# Patient Record
Sex: Male | Born: 1970 | Race: White | Hispanic: No | Marital: Married | Smoking: Former smoker
Health system: Southern US, Community
[De-identification: ages and names within clinical notes are randomized; demographics above are authoritative.]

## PROBLEM LIST (undated history)

## (undated) DIAGNOSIS — I10 Essential (primary) hypertension: Secondary | ICD-10-CM

## (undated) DIAGNOSIS — C801 Malignant (primary) neoplasm, unspecified: Secondary | ICD-10-CM

## (undated) DIAGNOSIS — K219 Gastro-esophageal reflux disease without esophagitis: Secondary | ICD-10-CM

## (undated) DIAGNOSIS — I2510 Atherosclerotic heart disease of native coronary artery without angina pectoris: Secondary | ICD-10-CM

## (undated) DIAGNOSIS — E1169 Type 2 diabetes mellitus with other specified complication: Secondary | ICD-10-CM

## (undated) DIAGNOSIS — Z8669 Personal history of other diseases of the nervous system and sense organs: Secondary | ICD-10-CM

## (undated) DIAGNOSIS — R739 Hyperglycemia, unspecified: Secondary | ICD-10-CM

## (undated) DIAGNOSIS — M549 Dorsalgia, unspecified: Secondary | ICD-10-CM

## (undated) DIAGNOSIS — D649 Anemia, unspecified: Secondary | ICD-10-CM

## (undated) DIAGNOSIS — G629 Polyneuropathy, unspecified: Secondary | ICD-10-CM

## (undated) DIAGNOSIS — M1990 Unspecified osteoarthritis, unspecified site: Secondary | ICD-10-CM

## (undated) DIAGNOSIS — E785 Hyperlipidemia, unspecified: Secondary | ICD-10-CM

## (undated) DIAGNOSIS — M2550 Pain in unspecified joint: Secondary | ICD-10-CM

## (undated) DIAGNOSIS — E669 Obesity, unspecified: Secondary | ICD-10-CM

## (undated) HISTORY — DX: Personal history of other diseases of the nervous system and sense organs: Z86.69

## (undated) HISTORY — DX: Hyperglycemia, unspecified: R73.9

## (undated) HISTORY — PX: NO PAST SURGERIES: SHX2092

## (undated) HISTORY — DX: Essential (primary) hypertension: I10

## (undated) HISTORY — DX: Obesity, unspecified: E66.9

## (undated) HISTORY — DX: Gastro-esophageal reflux disease without esophagitis: K21.9

## (undated) HISTORY — DX: Type 2 diabetes mellitus with other specified complication: E11.69

## (undated) HISTORY — DX: Hyperlipidemia, unspecified: E78.5

## (undated) HISTORY — DX: Pain in unspecified joint: M25.50

## (undated) HISTORY — DX: Dorsalgia, unspecified: M54.9

## (undated) MED FILL — Dexamethasone Sodium Phosphate Inj 100 MG/10ML: INTRAMUSCULAR | Qty: 1 | Status: AC

---

## 2012-04-25 ENCOUNTER — Encounter: Payer: Self-pay | Admitting: Family

## 2012-04-25 ENCOUNTER — Ambulatory Visit (INDEPENDENT_AMBULATORY_CARE_PROVIDER_SITE_OTHER): Payer: Managed Care, Other (non HMO) | Admitting: Family

## 2012-04-25 VITALS — BP 176/130 | HR 66 | Temp 98.2°F | Resp 16 | Ht 74.5 in | Wt 264.0 lb

## 2012-04-25 DIAGNOSIS — R51 Headache: Secondary | ICD-10-CM | POA: Insufficient documentation

## 2012-04-25 DIAGNOSIS — R519 Headache, unspecified: Secondary | ICD-10-CM | POA: Insufficient documentation

## 2012-04-25 DIAGNOSIS — I10 Essential (primary) hypertension: Secondary | ICD-10-CM

## 2012-04-25 LAB — BASIC METABOLIC PANEL
BUN: 13 mg/dL (ref 6–23)
CO2: 29 mEq/L (ref 19–32)
Calcium: 9.7 mg/dL (ref 8.4–10.5)
Chloride: 104 mEq/L (ref 96–112)
Creat: 1.24 mg/dL (ref 0.50–1.35)
Glucose, Bld: 103 mg/dL — ABNORMAL HIGH (ref 70–99)
Potassium: 4.7 mEq/L (ref 3.5–5.3)
Sodium: 140 mEq/L (ref 135–145)

## 2012-04-25 MED ORDER — OLMESARTAN-AMLODIPINE-HCTZ 20-5-12.5 MG PO TABS: 1.0000 | ORAL_TABLET | Freq: Every day | ORAL | Status: DC

## 2012-04-25 MED ORDER — NEBIVOLOL HCL 10 MG PO TABS: 10.0000 mg | ORAL_TABLET | Freq: Every day | ORAL | Status: DC

## 2012-04-25 NOTE — Patient Instructions (Addendum)
Please follow up in 2 weeks for a complete physical.

## 2012-04-25 NOTE — Assessment & Plan Note (Signed)
Uncontrolled HTN.  He has been off of meds x > 1year and believe that his pressure has been running high like this all along based on the readings he has gotten at [facility] etc.  He has been on bystolic and tribenzor.  10mg  of bystolic given today in the office along with samples for both.  Obtain bmet today.  We discussed low sodium diet and weight loss.  We also discussed risks of uncontrolled high blood pressure.

## 2012-04-25 NOTE — Progress Notes (Signed)
Subjective:    Patient ID: Robert [name], male    DOB: 09-20-1970, 41 y.o.   MRN: [id number]  HPI  Mr.  [name] is a 41 yr old male who presents today to establish care.  1) HTN- Diagnosed about 3 years ago.  He was on medications x 1.5 years.  He lost health insurance and stopped medications.  Reports hx of neg stress test.  Reports some musculoskeletal chest pain.  Denies SOB.  Notes occasional ankle swelling.   2) Headaches-  Reports that he has dental issues which he believes attribute to his headaches. Years ago, he had migraines, none x >10 yrs.   Review of Systems  Constitutional: Negative for unexpected weight change.  HENT: Negative for hearing loss.   Eyes: Negative for visual disturbance.  Respiratory: Negative for shortness of breath.   Cardiovascular: Positive for leg swelling.  Gastrointestinal: Negative for nausea, vomiting and diarrhea.       Mild heartburn, uses otc pepcid- helps  Genitourinary: Negative for dysuria and frequency.  Musculoskeletal:       Some musculoskeletal back pain.  Skin: Negative for rash.  Neurological: Positive for headaches.  Hematological: Does not bruise/bleed easily.  Psychiatric/Behavioral:       Denies depression/anxiety   Past Medical History  Diagnosis Date  . Hypertension   . History of migraine headaches     History   Social History  . Marital Status: Married    Spouse Name: N/A    Number of Children: 4  . Years of Education: N/A   Occupational History  .  [employer]   Social History Main Topics  . Smoking status: Former Smoker -- 9 years    Types: Cigarettes  . Smokeless tobacco: Never Used  . Alcohol Use: 3.6 - 6.0 oz/week    6-10 Cans of beer per week  . Drug Use: Not on file  . Sexually Active: Not on file   Other Topics Concern  . Not on file   Social History Narrative   Regular exercise:  Walks 1-2 x weeklyCaffeine use:  3-4 daily4 children- oldest first marriage 93 (son), 59 son, 10 son, 63 yr old  girlMarriedMoved here from Art therapist at [employer]    Past Surgical History  Procedure Date  . No past surgeries     Family History  Problem Relation Age of Onset  . Arthritis Mother   . Hypertension Mother   . Arthritis Father   . Hypertension Father   . Diabetes Father   . Arthritis Maternal Grandmother   . Diabetes Maternal Grandmother   . Arthritis Paternal Grandmother   . Diabetes Paternal Grandmother   . Diabetes Paternal Grandfather   . Cancer Neg Hx   . Kidney disease Neg Hx   . Hyperlipidemia Neg Hx   . Heart disease Neg Hx     Allergies  Allergen Reactions  . Latex Rash    Current Outpatient Prescriptions on File Prior to Visit  Medication Sig Dispense Refill  . nebivolol (BYSTOLIC) 10 MG tablet Take 1 tablet (10 mg total) by mouth daily.  28 tablet  0  . Olmesartan-Amlodipine-HCTZ (TRIBENZOR) 20-5-12.5 MG TABS Take 1 tablet by mouth daily.  21 tablet  0    BP 176/130  Pulse 66  Temp 98.2 F (36.8 C) (Oral)  Resp 16  Ht 6' 2.5" (1.892 m)  Wt 264 lb (119.75 kg)  BMI 33.44 kg/m2  SpO2 96%       Objective:   Physical Exam  Constitutional: He is oriented to person, place, and time. He appears well-developed and well-nourished. No distress.  Cardiovascular: Normal rate and regular rhythm.   No murmur heard. Pulmonary/Chest: Effort normal and breath sounds normal. No respiratory distress. He has no wheezes. He has no rales. He exhibits no tenderness.  Abdominal: Soft. Bowel sounds are normal.  Neurological: He is alert and oriented to person, place, and time.  Skin: Skin is warm and dry.  Psychiatric: He has a normal mood and affect. His behavior is normal. Judgment and thought content normal.          Assessment & Plan:  45 minutes spent with pt today. > 50% of this time was spent counseling pt on weight loss and hypertension.

## 2012-04-25 NOTE — Assessment & Plan Note (Signed)
Pt attributes this to dental issues.  I advised him to establish with a local dentist for further evaluation.

## 2012-04-28 ENCOUNTER — Encounter: Payer: Self-pay | Admitting: Family

## 2012-05-21 ENCOUNTER — Encounter: Payer: Self-pay | Admitting: Internal Medicine

## 2012-05-21 ENCOUNTER — Ambulatory Visit (INDEPENDENT_AMBULATORY_CARE_PROVIDER_SITE_OTHER): Payer: Managed Care, Other (non HMO) | Admitting: Internal Medicine

## 2012-05-21 VITALS — BP 142/90 | HR 54 | Temp 98.3°F | Ht 74.5 in | Wt 265.0 lb

## 2012-05-21 DIAGNOSIS — Z0000 Encounter for general adult medical examination without abnormal findings: Secondary | ICD-10-CM

## 2012-05-21 DIAGNOSIS — I10 Essential (primary) hypertension: Secondary | ICD-10-CM

## 2012-05-21 DIAGNOSIS — Z3009 Encounter for other general counseling and advice on contraception: Secondary | ICD-10-CM

## 2012-05-21 LAB — HEPATIC FUNCTION PANEL
ALT: 21 U/L (ref 0–53)
AST: 20 U/L (ref 0–37)
Albumin: 4.4 g/dL (ref 3.5–5.2)
Alkaline Phosphatase: 52 U/L (ref 39–117)
Bilirubin, Direct: 0.1 mg/dL (ref 0.0–0.3)
Indirect Bilirubin: 0.5 mg/dL (ref 0.0–0.9)
Total Bilirubin: 0.6 mg/dL (ref 0.3–1.2)
Total Protein: 6.7 g/dL (ref 6.0–8.3)

## 2012-05-21 LAB — URINALYSIS, ROUTINE W REFLEX MICROSCOPIC
Bilirubin Urine: NEGATIVE
Glucose, UA: NEGATIVE mg/dL
Hgb urine dipstick: NEGATIVE
Ketones, ur: NEGATIVE mg/dL
Leukocytes, UA: NEGATIVE
Nitrite: NEGATIVE
Protein, ur: NEGATIVE mg/dL
Specific Gravity, Urine: 1.024 (ref 1.005–1.030)
Urobilinogen, UA: 0.2 mg/dL (ref 0.0–1.0)
pH: 5.5 (ref 5.0–8.0)

## 2012-05-21 LAB — CBC WITH DIFFERENTIAL/PLATELET
Basophils Absolute: 0 10*3/uL (ref 0.0–0.1)
Basophils Relative: 1 % (ref 0–1)
Eosinophils Absolute: 0.3 10*3/uL (ref 0.0–0.7)
Eosinophils Relative: 4 % (ref 0–5)
HCT: 45.5 % (ref 39.0–52.0)
Hemoglobin: 15.8 g/dL (ref 13.0–17.0)
Lymphocytes Relative: 32 % (ref 12–46)
Lymphs Abs: 2 10*3/uL (ref 0.7–4.0)
MCH: 29.3 pg (ref 26.0–34.0)
MCHC: 34.7 g/dL (ref 30.0–36.0)
MCV: 84.4 fL (ref 78.0–100.0)
Monocytes Absolute: 0.4 10*3/uL (ref 0.1–1.0)
Monocytes Relative: 6 % (ref 3–12)
Neutro Abs: 3.7 10*3/uL (ref 1.7–7.7)
Neutrophils Relative %: 57 % (ref 43–77)
Platelets: 208 10*3/uL (ref 150–400)
RBC: 5.39 MIL/uL (ref 4.22–5.81)
RDW: 13.2 % (ref 11.5–15.5)
WBC: 6.4 10*3/uL (ref 4.0–10.5)

## 2012-05-21 LAB — TSH: TSH: 0.757 u[IU]/mL (ref 0.350–4.500)

## 2012-05-21 LAB — HEMOGLOBIN A1C
Hgb A1c MFr Bld: 6 % — ABNORMAL HIGH (ref <5.7)
Mean Plasma Glucose: 126 mg/dL — ABNORMAL HIGH (ref <117)

## 2012-05-21 LAB — LIPID PANEL
Cholesterol: 147 mg/dL (ref 0–200)
HDL: 31 mg/dL — ABNORMAL LOW (ref >39)
LDL Cholesterol: 93 mg/dL (ref 0–99)
Total CHOL/HDL Ratio: 4.7 Ratio
Triglycerides: 114 mg/dL (ref <150)
VLDL: 23 mg/dL (ref 0–40)

## 2012-05-21 MED ORDER — AZITHROMYCIN 250 MG PO TABS: ORAL_TABLET | ORAL | Status: AC

## 2012-05-21 MED ORDER — NEBIVOLOL HCL 10 MG PO TABS: 10.0000 mg | ORAL_TABLET | Freq: Every day | ORAL | Status: DC

## 2012-05-21 MED ORDER — OLMESARTAN-AMLODIPINE-HCTZ 20-5-12.5 MG PO TABS: 1.0000 | ORAL_TABLET | Freq: Every day | ORAL | Status: DC

## 2012-05-26 ENCOUNTER — Telehealth: Payer: Self-pay | Admitting: *Deleted

## 2012-05-26 NOTE — Telephone Encounter (Signed)
Message copied by Merrilyn Puma on Mon May 26, 2012  9:03 AM ------      Message from: Edwyna Perfect      Created: Sun May 25, 2012 11:23 AM       hdl low. Can improve with regular aerobic exercise. a1c mildly high. Low sugar/carb diet and exercise

## 2012-05-26 NOTE — Telephone Encounter (Signed)
Left mess for patient to call back.  

## 2012-05-31 DIAGNOSIS — Z0000 Encounter for general adult medical examination without abnormal findings: Secondary | ICD-10-CM | POA: Insufficient documentation

## 2012-05-31 DIAGNOSIS — Z1211 Encounter for screening for malignant neoplasm of colon: Secondary | ICD-10-CM | POA: Insufficient documentation

## 2012-05-31 NOTE — Progress Notes (Signed)
  Subjective:    Patient ID: Robert [name], male    DOB: 10/28/1970, 41 y.o.   MRN: [id number]  HPI patient presents to clinic for complete physical exam. Blood pressure is minimally elevated. Interested in possible referral for vasectomy. Notes two-week history of cough without improvement. No shortness of breath wheezing fever or chills.   Past Medical History  Diagnosis Date  . Hypertension   . History of migraine headaches    Past Surgical History  Procedure Date  . No past surgeries     reports that he has quit smoking. His smoking use included Cigarettes. He quit after 9 years of use. He has never used smokeless tobacco. He reports that he drinks about 3.6 - 6 ounces of alcohol per week. His drug history not on file. family history includes Arthritis in his father, maternal grandmother, mother, and paternal grandmother; Diabetes in his father, maternal grandmother, paternal grandfather, and paternal grandmother; and Hypertension in his father and mother.  There is no history of Cancer, and Kidney disease, and Hyperlipidemia, and Heart disease, . Allergies  Allergen Reactions  . Latex Rash     Review of Systems see history of present illness     Objective:   Physical Exam  Physical Exam  Nursing note and vitals reviewed. Constitutional: He appears well-developed and well-nourished. No distress.  HENT:  Head: Normocephalic and atraumatic.  Right Ear: Tympanic membrane and external ear normal.  Left Ear: Tympanic membrane and external ear normal.  Nose: Nose normal.  Mouth/Throat: Uvula is midline, oropharynx is clear and moist and mucous membranes are normal. No oropharyngeal exudate.  Eyes: Conjunctivae and EOM are normal. Pupils are equal, round, and reactive to light. Right eye exhibits no discharge. Left eye exhibits no discharge. No scleral icterus.  Neck: Neck supple. Carotid bruit is not present. No thyromegaly present.  Cardiovascular: Normal rate, regular rhythm and  normal heart sounds.  Exam reveals no gallop and no friction rub.   No murmur heard. Pulmonary/Chest: Effort normal and breath sounds normal. No respiratory distress. He has no wheezes. He has no rales.  Abdominal: Soft. He exhibits no distension and no mass. There is no hepatosplenomegaly. There is no tenderness. There is no rebound. Hernia confirmed negative in the right inguinal area and confirmed negative in the left inguinal area.   Lymphadenopathy:    He has no cervical adenopathy.  Neurological: He is alert.  Skin: Skin is warm and dry. He is not diaphoretic.  Psychiatric: He has a normal mood and affect.        Assessment & Plan:

## 2012-05-31 NOTE — Assessment & Plan Note (Signed)
Obtain CPE labs. Recommend out patient blood pressure log to be submitted for review. Urology consult for consideration of vasectomy. Provide with antibiotic given persistence of cough. Followup if no improvement or worsening.

## 2012-06-04 ENCOUNTER — Encounter: Payer: Self-pay | Admitting: *Deleted

## 2012-06-04 NOTE — Telephone Encounter (Signed)
Called pt's phone number in EMR & this is incorrect number and pt is unknown to owner of number; mailed results letter/SLS

## 2012-07-02 ENCOUNTER — Ambulatory Visit (INDEPENDENT_AMBULATORY_CARE_PROVIDER_SITE_OTHER): Payer: Managed Care, Other (non HMO) | Admitting: Family

## 2012-07-02 ENCOUNTER — Encounter: Payer: Self-pay | Admitting: Family

## 2012-07-02 VITALS — BP 142/90 | HR 51 | Temp 98.1°F | Resp 16 | Wt 272.0 lb

## 2012-07-02 DIAGNOSIS — L290 Pruritus ani: Secondary | ICD-10-CM

## 2012-07-02 MED ORDER — NYSTATIN 100000 UNIT/GM EX POWD: Freq: Two times a day (BID) | CUTANEOUS | Status: DC

## 2012-07-02 MED ORDER — HYDROCORTISONE ACE-PRAMOXINE 1-1 % RE FOAM: 1.0000 | Freq: Two times a day (BID) | RECTAL | Status: DC

## 2012-07-02 NOTE — Patient Instructions (Addendum)
Please call if symptoms worsen or if no improvement in 1 week.  

## 2012-07-02 NOTE — Progress Notes (Signed)
  Subjective:    Patient ID: Robert [name], male    DOB: [birth year], 41 y.o.   MRN: [id number]  HPI  Mr.  [name] is a 41 yr old male who presents today with chief complaint of rectal pain.  He reports that he has seen a small amount of blood on the  Toilet tissue after bm's.  BM's have been softer than usual.  He notes significant itching in the rectal area despite use of gold bond powder.   Review of Systems    see HPI  Past Medical History  Diagnosis Date  . Hypertension   . History of migraine headaches     History   Social History  . Marital Status: Married    Spouse Name: N/A    Number of Children: 4  . Years of Education: N/A   Occupational History  .  [employer]   Social History Main Topics  . Smoking status: Former Smoker -- 9 years    Types: Cigarettes  . Smokeless tobacco: Never Used  . Alcohol Use: 3.6 - 6.0 oz/week    6-10 Cans of beer per week  . Drug Use: Not on file  . Sexually Active: Not on file   Other Topics Concern  . Not on file   Social History Narrative   Regular exercise:  Walks 1-2 x weeklyCaffeine use:  3-4 daily4 children- oldest first marriage 44 (son), 37 son, 10 son, 27 yr old girlMarriedMoved here from Art therapist at [employer]    Past Surgical History  Procedure Date  . No past surgeries     Family History  Problem Relation Age of Onset  . Arthritis Mother   . Hypertension Mother   . Arthritis Father   . Hypertension Father   . Diabetes Father   . Arthritis Maternal Grandmother   . Diabetes Maternal Grandmother   . Arthritis Paternal Grandmother   . Diabetes Paternal Grandmother   . Diabetes Paternal Grandfather   . Cancer Neg Hx   . Kidney disease Neg Hx   . Hyperlipidemia Neg Hx   . Heart disease Neg Hx     Allergies  Allergen Reactions  . Latex Rash    Current Outpatient Prescriptions on File Prior to Visit  Medication Sig Dispense Refill  . nebivolol (BYSTOLIC) 10 MG tablet Take 1 tablet (10 mg total) by  mouth daily.  30 tablet  6  . Olmesartan-Amlodipine-HCTZ (TRIBENZOR) 20-5-12.5 MG TABS Take 1 tablet by mouth daily.  30 tablet  6    BP 142/90  Pulse 51  Temp 98.1 F (36.7 C) (Oral)  Resp 16  Wt 272 lb (123.378 kg)  SpO2 97%    Objective:   Physical Exam  Genitourinary:       Some erythema of skin surrounding anus in gluteal fold.  No visible exterior hemorrhoids are noted.  No palpable interior hemorrhoids are noted.           Assessment & Plan:

## 2012-07-02 NOTE — Assessment & Plan Note (Signed)
He could have some internal hemorrhoids which I was unable to palpate.  Appears to have candidal skin infection (mild) surrounding anus.  Will rx proctofoam HC cream along with nystatin powder to be used on skin.  If no improvement consider stool studies.

## 2012-08-20 ENCOUNTER — Ambulatory Visit: Payer: Managed Care, Other (non HMO) | Admitting: Internal Medicine

## 2012-08-20 DIAGNOSIS — Z0289 Encounter for other administrative examinations: Secondary | ICD-10-CM

## 2012-08-20 NOTE — Progress Notes (Signed)
  Subjective:    Patient ID: Robert [name], male    DOB: Dec 20, 1970, 42 y.o.   MRN: [id number]  HPI no show    Review of Systems      Objective:   Physical Exam         Assessment & Plan:

## 2012-11-06 ENCOUNTER — Ambulatory Visit (INDEPENDENT_AMBULATORY_CARE_PROVIDER_SITE_OTHER): Payer: 59 | Admitting: Family Medicine

## 2012-11-06 ENCOUNTER — Encounter: Payer: Self-pay | Admitting: Family Medicine

## 2012-11-06 VITALS — BP 164/102 | HR 67 | Temp 98.2°F | Ht 74.5 in | Wt 267.4 lb

## 2012-11-06 DIAGNOSIS — R739 Hyperglycemia, unspecified: Secondary | ICD-10-CM

## 2012-11-06 DIAGNOSIS — R7309 Other abnormal glucose: Secondary | ICD-10-CM

## 2012-11-06 DIAGNOSIS — E785 Hyperlipidemia, unspecified: Secondary | ICD-10-CM

## 2012-11-06 DIAGNOSIS — I10 Essential (primary) hypertension: Secondary | ICD-10-CM

## 2012-11-06 MED ORDER — CARVEDILOL 6.25 MG PO TABS: 6.2500 mg | ORAL_TABLET | Freq: Two times a day (BID) | ORAL | Status: DC

## 2012-11-06 MED ORDER — LISINOPRIL-HYDROCHLOROTHIAZIDE 20-12.5 MG PO TABS: 1.0000 | ORAL_TABLET | Freq: Two times a day (BID) | ORAL | Status: DC

## 2012-11-06 NOTE — Patient Instructions (Addendum)
DASH diet and consider sleep study  Hypertension As your heart beats, it forces blood through your arteries. This force is your blood pressure. If the pressure is too high, it is called hypertension (HTN) or high blood pressure. HTN is dangerous because you may have it and not know it. High blood pressure may mean that your heart has to work harder to pump blood. Your arteries may be narrow or stiff. The extra work puts you at risk for heart disease, stroke, and other problems.  Blood pressure consists of two numbers, a higher number over a lower, 110/72, for example. It is stated as "110 over 72." The ideal is below 120 for the top number (systolic) and under 80 for the bottom (diastolic). Write down your blood pressure today. You should pay close attention to your blood pressure if you have certain conditions such as:  Heart failure.  Prior heart attack.  Diabetes  Chronic kidney disease.  Prior stroke.  Multiple risk factors for heart disease. To see if you have HTN, your blood pressure should be measured while you are seated with your arm held at the level of the heart. It should be measured at least twice. A one-time elevated blood pressure reading (especially in the Emergency Department) does not mean that you need treatment. There may be conditions in which the blood pressure is different between your right and left arms. It is important to see your caregiver soon for a recheck. Most people have essential hypertension which means that there is not a specific cause. This type of high blood pressure may be lowered by changing lifestyle factors such as:  Stress.  Smoking.  Lack of exercise.  Excessive weight.  Drug/tobacco/alcohol use.  Eating less salt. Most people do not have symptoms from high blood pressure until it has caused damage to the body. Effective treatment can often prevent, delay or reduce that damage. TREATMENT  When a cause has been identified, treatment for  high blood pressure is directed at the cause. There are a large number of medications to treat HTN. These fall into several categories, and your caregiver will help you select the medicines that are best for you. Medications may have side effects. You should review side effects with your caregiver. If your blood pressure stays high after you have made lifestyle changes or started on medicines,   Your medication(s) may need to be changed.  Other problems may need to be addressed.  Be certain you understand your prescriptions, and know how and when to take your medicine.  Be sure to follow up with your caregiver within the time frame advised (usually within two weeks) to have your blood pressure rechecked and to review your medications.  If you are taking more than one medicine to lower your blood pressure, make sure you know how and at what times they should be taken. Taking two medicines at the same time can result in blood pressure that is too low. SEEK IMMEDIATE MEDICAL CARE IF:  You develop a severe headache, blurred or changing vision, or confusion.  You have unusual weakness or numbness, or a faint feeling.  You have severe chest or abdominal pain, vomiting, or breathing problems. MAKE SURE YOU:   Understand these instructions.  Will watch your condition.  Will get help right away if you are not doing well or get worse. Document Released: 07/30/2005 Document Revised: 10/22/2011 Document Reviewed: 03/19/2008 [facility] Patient Information 2013 [place], [place].

## 2012-11-09 ENCOUNTER — Encounter: Payer: Self-pay | Admitting: Family Medicine

## 2012-11-09 DIAGNOSIS — E785 Hyperlipidemia, unspecified: Secondary | ICD-10-CM

## 2012-11-09 DIAGNOSIS — E1169 Type 2 diabetes mellitus with other specified complication: Secondary | ICD-10-CM

## 2012-11-09 DIAGNOSIS — R739 Hyperglycemia, unspecified: Secondary | ICD-10-CM

## 2012-11-09 DIAGNOSIS — E669 Obesity, unspecified: Secondary | ICD-10-CM

## 2012-11-09 HISTORY — DX: Hyperlipidemia, unspecified: E78.5

## 2012-11-09 HISTORY — DX: Type 2 diabetes mellitus with other specified complication: E66.9

## 2012-11-09 HISTORY — DX: Hyperglycemia, unspecified: R73.9

## 2012-11-09 HISTORY — DX: Type 2 diabetes mellitus with other specified complication: E11.69

## 2012-11-09 NOTE — Assessment & Plan Note (Signed)
Has not been taking meds secondary to costs. Will d/c meds and switch to Carvedilol and Lisinopril hct. Try DASH diet

## 2012-11-09 NOTE — Assessment & Plan Note (Signed)
Mild, minimize simple carbs. Increase exercise.

## 2012-11-09 NOTE — Progress Notes (Signed)
Patient ID: Robert [name], male   DOB: [birth year], 42 y.o.   MRN: [id number] Robert [name] [id number] 1971/03/05 11/09/2012      Progress Note-Follow Up  Subjective  Chief Complaint  Chief Complaint  Patient presents with  . Follow-up    HPI  Patient is a 42 year old male who is in today for followup. Generally he is feeling well technologist she has not been taking his hypertensive meds do to cost. Denies any headache or chest pain. No palpitations or shortness of breath. No recent illness or congestion. Is not taking any new over-the-counter medications. Denies any new or worsening stressors or difficulty sleeping.  Past Medical History  Diagnosis Date  . Hypertension   . History of migraine headaches   . Dyslipidemia 11/09/2012  . Hyperglycemia 11/09/2012    Past Surgical History  Procedure Laterality Date  . No past surgeries      Family History  Problem Relation Age of Onset  . Arthritis Mother   . Hypertension Mother   . Arthritis Father   . Hypertension Father   . Diabetes Father   . Arthritis Maternal Grandmother   . Diabetes Maternal Grandmother   . Arthritis Paternal Grandmother   . Diabetes Paternal Grandmother   . Diabetes Paternal Grandfather   . Cancer Neg Hx   . Kidney disease Neg Hx   . Hyperlipidemia Neg Hx   . Heart disease Neg Hx     History   Social History  . Marital Status: Married    Spouse Name: N/A    Number of Children: 4  . Years of Education: N/A   Occupational History  .  [employer]   Social History Main Topics  . Smoking status: Former Smoker -- 9 years    Types: Cigarettes  . Smokeless tobacco: Never Used  . Alcohol Use: 3.6 - 6 oz/week    6-10 Cans of beer per week  . Drug Use: Not on file  . Sexually Active: Not on file   Other Topics Concern  . 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   No current facility-administered medications on file prior to visit.    Allergies  Allergen Reactions  . Latex Rash    Review of Systems  Review of Systems  Constitutional: Negative for fever and malaise/fatigue.  HENT: Negative for congestion.   Eyes: Negative for discharge.  Respiratory: Negative for shortness of breath.   Cardiovascular: Negative for chest pain, palpitations and leg swelling.  Gastrointestinal: Negative for nausea, abdominal pain and diarrhea.  Genitourinary: Negative for dysuria.  Musculoskeletal: Negative for falls.  Skin: Negative for rash.  Neurological: Negative for loss of consciousness and headaches.  Endo/Heme/Allergies: Negative for polydipsia.  Psychiatric/Behavioral: Negative for depression and suicidal ideas. The patient is not nervous/anxious and does not have insomnia.     Objective  BP 164/102  Pulse 67  Temp(Src) 98.2 F (36.8 C) (Oral)  Ht 6' 2.5" (1.892 m)  Wt 267 lb 6.4 oz (121.292 kg)  BMI 33.88 kg/m2  SpO2 95%  Physical Exam  Physical Exam  Constitutional: He  is oriented to person, place, and time and well-developed, well-nourished, and in no distress. No distress.  HENT:  Head: Normocephalic and atraumatic.  Eyes: Conjunctivae are normal.  Neck: Neck supple. No thyromegaly present.  Cardiovascular: Normal rate, regular rhythm and normal heart sounds.   No murmur heard. Pulmonary/Chest: Effort normal and breath sounds normal. No respiratory distress.  Abdominal: He exhibits no distension and no mass. There is no tenderness.  Musculoskeletal: He exhibits no edema.  Neurological: He is alert and oriented to person, place, and time.  Skin: Skin is warm.  Psychiatric: Memory, affect and judgment normal.    Lab Results  Component Value Date   TSH 0.757 05/21/2012   Lab  Results  Component Value Date   WBC 6.4 05/21/2012   HGB 15.8 05/21/2012   HCT 45.5 05/21/2012   MCV 84.4 05/21/2012   PLT 208 05/21/2012   Lab Results  Component Value Date   CREATININE 1.24 04/25/2012   BUN 13 04/25/2012   NA 140 04/25/2012   K 4.7 04/25/2012   CL 104 04/25/2012   CO2 29 04/25/2012   Lab Results  Component Value Date   ALT 21 05/21/2012   AST 20 05/21/2012   ALKPHOS 52 05/21/2012   BILITOT 0.6 05/21/2012   Lab Results  Component Value Date   CHOL 147 05/21/2012   Lab Results  Component Value Date   HDL 31* 05/21/2012   Lab Results  Component Value Date   LDLCALC 93 05/21/2012   Lab Results  Component Value Date   TRIG 114 05/21/2012   Lab Results  Component Value Date   CHOLHDL 4.7 05/21/2012     Assessment & Plan  HTN (hypertension) Has not been taking meds secondary to costs. Will d/c meds and switch to Carvedilol and Lisinopril hct. Try DASH diet  Hyperglycemia Mild, minimize simple carbs. Increase exercise.  Dyslipidemia Slightly low hdl cholesterol. Encouraged increased exercise and consider adding Krill oil caps daily

## 2012-11-09 NOTE — Assessment & Plan Note (Signed)
Slightly low hdl cholesterol. Encouraged increased exercise and consider adding Krill oil caps daily

## 2012-11-10 ENCOUNTER — Encounter: Payer: Self-pay | Admitting: Family Medicine

## 2012-12-08 ENCOUNTER — Encounter: Payer: Self-pay | Admitting: Family Medicine

## 2012-12-08 ENCOUNTER — Ambulatory Visit (INDEPENDENT_AMBULATORY_CARE_PROVIDER_SITE_OTHER): Payer: 59 | Admitting: Family Medicine

## 2012-12-08 VITALS — BP 158/110 | HR 64 | Temp 98.2°F | Ht 74.5 in | Wt 264.0 lb

## 2012-12-08 DIAGNOSIS — I10 Essential (primary) hypertension: Secondary | ICD-10-CM

## 2012-12-08 MED ORDER — LISINOPRIL-HYDROCHLOROTHIAZIDE 20-12.5 MG PO TABS: 1.0000 | ORAL_TABLET | Freq: Two times a day (BID) | ORAL | Status: DC

## 2012-12-08 MED ORDER — CARVEDILOL 6.25 MG PO TABS: 6.2500 mg | ORAL_TABLET | Freq: Two times a day (BID) | ORAL | Status: DC

## 2012-12-08 MED ORDER — AMLODIPINE BESYLATE 5 MG PO TABS: 5.0000 mg | ORAL_TABLET | Freq: Every day | ORAL | Status: DC

## 2012-12-08 NOTE — Assessment & Plan Note (Addendum)
Feeling better since his last visit. Did not take his medications this am, he ran out last night. Start Amlodipine 5 mg dailyand return in 4 weeks on meds

## 2012-12-08 NOTE — Progress Notes (Signed)
Patient ID: Robert [name], male   DOB: May 27, 1971, 42 y.o.   MRN: [id number] Robert [name] [id number] May 30, 1971 12/08/2012      Progress Note-Follow Up  Subjective  Chief Complaint  Chief Complaint  Patient presents with  . Follow-up    1 month    HPI  Patient is a 42 year old Caucasian male in today for follow up. Unfortunately he ran out of his bp meds last night so he has not taken them today. He reports feeling better since his last visit. Is still seeing numbers 150-180/100-135 when he checks in elsewhere. Denies HA/CP/palp/SOB/illness, gi or gu concerns. Notes some paresthesias in finger tips in cold weather but no other acute concerns  Past Medical History  Diagnosis Date  . Hypertension   . History of migraine headaches   . Dyslipidemia 11/09/2012  . Hyperglycemia 11/09/2012    Past Surgical History  Procedure Laterality Date  . No past surgeries      Family History  Problem Relation Age of Onset  . Arthritis Mother   . Hypertension Mother   . Arthritis Father   . Hypertension Father   . Diabetes Father   . Arthritis Maternal Grandmother   . Diabetes Maternal Grandmother   . Stroke Maternal Grandmother   . Arthritis Paternal Grandmother   . Diabetes Paternal Grandmother   . Diabetes Paternal Grandfather   . Heart disease Paternal Grandfather     MI  . Cancer Neg Hx   . Kidney disease Neg Hx   . Hyperlipidemia Brother   . Hypertension Brother   . Diabetes Brother   . Hyperlipidemia Daughter   . Diabetes Daughter   . Hypertension Daughter     History   Social History  . Marital Status: Married    Spouse Name: N/A    Number of Children: 4  . Years of Education: N/A   Occupational History  .  [employer]   Social History Main Topics  . Smoking status: Former Smoker -- 9 years    Types: Cigarettes  . Smokeless tobacco: Never Used  . Alcohol Use: 3.6 - 6 oz/week    6-10 Cans of beer per week  . Drug Use: Not on file  . Sexually Active: Not on  file   Other Topics Concern  . 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   No current facility-administered medications on file prior to visit.    Allergies  Allergen Reactions  . Latex Rash    Review of Systems  Review of Systems  Constitutional: Negative for fever and malaise/fatigue.  HENT: Negative for congestion.   Eyes: Negative for discharge.  Respiratory: Negative for shortness of breath.   Cardiovascular: Negative for chest pain, palpitations and leg swelling.  Gastrointestinal: Negative for nausea, abdominal pain and diarrhea.  Genitourinary: Negative for dysuria.  Musculoskeletal: Negative for falls.  Skin: Negative for rash.  Neurological: Negative for loss of consciousness and headaches.  Endo/Heme/Allergies: Negative for polydipsia.  Psychiatric/Behavioral: Negative for depression and suicidal ideas. The patient is not nervous/anxious and does not have insomnia.  Objective  BP 158/110  Pulse 64  Temp(Src) 98.2 F (36.8 C) (Oral)  Ht 6' 2.5" (1.892 m)  Wt 264 lb 0.6 oz (119.768 kg)  BMI 33.46 kg/m2  SpO2 97%  Physical Exam  Physical Exam  Constitutional: He is oriented to person, place, and time and well-developed, well-nourished, and in no distress. No distress.  HENT:  Head: Normocephalic and atraumatic.  Eyes: Conjunctivae are normal.  Neck: Neck supple. No thyromegaly present.  Cardiovascular: Normal rate, regular rhythm and normal heart sounds.   No murmur heard. Pulmonary/Chest: Effort normal and breath sounds normal. No respiratory distress.  Abdominal: He exhibits no distension and no mass. There is no tenderness.  Musculoskeletal: He exhibits no edema.   Neurological: He is alert and oriented to person, place, and time.  Skin: Skin is warm.  Psychiatric: Memory, affect and judgment normal.    Lab Results  Component Value Date   TSH 0.757 05/21/2012   Lab Results  Component Value Date   WBC 6.4 05/21/2012   HGB 15.8 05/21/2012   HCT 45.5 05/21/2012   MCV 84.4 05/21/2012   PLT 208 05/21/2012   Lab Results  Component Value Date   CREATININE 1.24 04/25/2012   BUN 13 04/25/2012   NA 140 04/25/2012   K 4.7 04/25/2012   CL 104 04/25/2012   CO2 29 04/25/2012   Lab Results  Component Value Date   ALT 21 05/21/2012   AST 20 05/21/2012   ALKPHOS 52 05/21/2012   BILITOT 0.6 05/21/2012   Lab Results  Component Value Date   CHOL 147 05/21/2012   Lab Results  Component Value Date   HDL 31* 05/21/2012   Lab Results  Component Value Date   LDLCALC 93 05/21/2012   Lab Results  Component Value Date   TRIG 114 05/21/2012   Lab Results  Component Value Date   CHOLHDL 4.7 05/21/2012     Assessment & Plan  HTN (hypertension) Feeling better since his last visit. Did not take his medications this am, he ran out last night. Start Amlodipine 5 mg dailyand return in 4 weeks on meds

## 2012-12-08 NOTE — Patient Instructions (Addendum)

## 2013-01-08 ENCOUNTER — Ambulatory Visit (INDEPENDENT_AMBULATORY_CARE_PROVIDER_SITE_OTHER): Payer: 59 | Admitting: Family Medicine

## 2013-01-08 ENCOUNTER — Encounter: Payer: Self-pay | Admitting: Family Medicine

## 2013-01-08 VITALS — BP 132/92 | HR 72 | Temp 98.7°F | Ht 74.5 in | Wt 261.1 lb

## 2013-01-08 DIAGNOSIS — R739 Hyperglycemia, unspecified: Secondary | ICD-10-CM

## 2013-01-08 DIAGNOSIS — R7309 Other abnormal glucose: Secondary | ICD-10-CM

## 2013-01-08 DIAGNOSIS — E785 Hyperlipidemia, unspecified: Secondary | ICD-10-CM

## 2013-01-08 DIAGNOSIS — M2550 Pain in unspecified joint: Secondary | ICD-10-CM

## 2013-01-08 DIAGNOSIS — I10 Essential (primary) hypertension: Secondary | ICD-10-CM

## 2013-01-08 LAB — CBC
HCT: 46.1 % (ref 39.0–52.0)
Hemoglobin: 16.3 g/dL (ref 13.0–17.0)
MCH: 29.4 pg (ref 26.0–34.0)
MCHC: 35.4 g/dL (ref 30.0–36.0)
MCV: 83.2 fL (ref 78.0–100.0)
Platelets: 207 10*3/uL (ref 150–400)
RBC: 5.54 MIL/uL (ref 4.22–5.81)
RDW: 14 % (ref 11.5–15.5)
WBC: 7.3 10*3/uL (ref 4.0–10.5)

## 2013-01-08 LAB — HEPATIC FUNCTION PANEL
ALT: 35 U/L (ref 0–53)
AST: 29 U/L (ref 0–37)
Albumin: 4.7 g/dL (ref 3.5–5.2)
Alkaline Phosphatase: 51 U/L (ref 39–117)
Bilirubin, Direct: 0.1 mg/dL (ref 0.0–0.3)
Indirect Bilirubin: 0.7 mg/dL (ref 0.0–0.9)
Total Bilirubin: 0.8 mg/dL (ref 0.3–1.2)
Total Protein: 7.1 g/dL (ref 6.0–8.3)

## 2013-01-08 LAB — LIPID PANEL
Cholesterol: 167 mg/dL (ref 0–200)
HDL: 38 mg/dL — ABNORMAL LOW (ref >39)
LDL Cholesterol: 87 mg/dL (ref 0–99)
Total CHOL/HDL Ratio: 4.4 Ratio
Triglycerides: 209 mg/dL — ABNORMAL HIGH (ref <150)
VLDL: 42 mg/dL — ABNORMAL HIGH (ref 0–40)

## 2013-01-08 LAB — RENAL FUNCTION PANEL
Albumin: 4.7 g/dL (ref 3.5–5.2)
BUN: 13 mg/dL (ref 6–23)
CO2: 29 mEq/L (ref 19–32)
Calcium: 9.9 mg/dL (ref 8.4–10.5)
Chloride: 100 mEq/L (ref 96–112)
Creat: 1.12 mg/dL (ref 0.50–1.35)
Glucose, Bld: 106 mg/dL — ABNORMAL HIGH (ref 70–99)
Phosphorus: 3.7 mg/dL (ref 2.3–4.6)
Potassium: 4.3 mEq/L (ref 3.5–5.3)
Sodium: 136 mEq/L (ref 135–145)

## 2013-01-08 LAB — TSH: TSH: 1.016 u[IU]/mL (ref 0.350–4.500)

## 2013-01-08 NOTE — Patient Instructions (Addendum)
Krill oil caps, MegaRed Probiotic, Digestive Advantage daily   Hypertension As your heart beats, it forces blood through your arteries. This force is your blood pressure. If the pressure is too high, it is called hypertension (HTN) or high blood pressure. HTN is dangerous because you may have it and not know it. High blood pressure may mean that your heart has to work harder to pump blood. Your arteries may be narrow or stiff. The extra work puts you at risk for heart disease, stroke, and other problems.  Blood pressure consists of two numbers, a higher number over a lower, 110/72, for example. It is stated as "110 over 72." The ideal is below 120 for the top number (systolic) and under 80 for the bottom (diastolic). Write down your blood pressure today. You should pay close attention to your blood pressure if you have certain conditions such as:  Heart failure.  Prior heart attack.  Diabetes  Chronic kidney disease.  Prior stroke.  Multiple risk factors for heart disease. To see if you have HTN, your blood pressure should be measured while you are seated with your arm held at the level of the heart. It should be measured at least twice. A one-time elevated blood pressure reading (especially in the Emergency Department) does not mean that you need treatment. There may be conditions in which the blood pressure is different between your right and left arms. It is important to see your caregiver soon for a recheck. Most people have essential hypertension which means that there is not a specific cause. This type of high blood pressure may be lowered by changing lifestyle factors such as:  Stress.  Smoking.  Lack of exercise.  Excessive weight.  Drug/tobacco/alcohol use.  Eating less salt. Most people do not have symptoms from high blood pressure until it has caused damage to the body. Effective treatment can often prevent, delay or reduce that damage. TREATMENT  When a cause has been  identified, treatment for high blood pressure is directed at the cause. There are a large number of medications to treat HTN. These fall into several categories, and your caregiver will help you select the medicines that are best for you. Medications may have side effects. You should review side effects with your caregiver. If your blood pressure stays high after you have made lifestyle changes or started on medicines,   Your medication(s) may need to be changed.  Other problems may need to be addressed.  Be certain you understand your prescriptions, and know how and when to take your medicine.  Be sure to follow up with your caregiver within the time frame advised (usually within two weeks) to have your blood pressure rechecked and to review your medications.  If you are taking more than one medicine to lower your blood pressure, make sure you know how and at what times they should be taken. Taking two medicines at the same time can result in blood pressure that is too low. SEEK IMMEDIATE MEDICAL CARE IF:  You develop a severe headache, blurred or changing vision, or confusion.  You have unusual weakness or numbness, or a faint feeling.  You have severe chest or abdominal pain, vomiting, or breathing problems. MAKE SURE YOU:   Understand these instructions.  Will watch your condition.  Will get help right away if you are not doing well or get worse. Document Released: 07/30/2005 Document Revised: 10/22/2011 Document Reviewed: 03/19/2008 [facility] Patient Information 2014 [employer], [place].

## 2013-01-09 LAB — MAGNESIUM: Magnesium: 2.1 mg/dL (ref 1.5–2.5)

## 2013-01-09 LAB — RHEUMATOID FACTOR: Rhuematoid fact SerPl-aCnc: <10 IU/mL (ref <=14)

## 2013-01-09 LAB — ANA: Anti Nuclear Antibody(ANA): NEGATIVE

## 2013-01-09 NOTE — Progress Notes (Signed)
Quick Note:  Patient Informed and voiced understanding ______ 

## 2013-01-11 ENCOUNTER — Encounter: Payer: Self-pay | Admitting: Family Medicine

## 2013-01-11 DIAGNOSIS — M2550 Pain in unspecified joint: Secondary | ICD-10-CM

## 2013-01-11 HISTORY — DX: Pain in unspecified joint: M25.50

## 2013-01-11 NOTE — Assessment & Plan Note (Signed)
Labs normal, add krill oil increase exercise, Aleve or Advil prn.

## 2013-01-11 NOTE — Progress Notes (Signed)
Patient ID: Robert [name], male   DOB: July 26, 1971, 42 y.o.   MRN: [id number] Robert [name] [id number] 1971-05-05 01/11/2013      Progress Note-Follow Up  Subjective  Chief Complaint  Chief Complaint  Patient presents with  . Follow-up    4 week    HPI  Patient is a 42 year old male in today for followup. Patient's biggest concern is increased pain. Has pain in numerous joints. Bilateral hips right worse than left. Also pain in knees ankles and low back. No recent fall, fever or illness,. Has been taking medicaitons as prescribed and has developed some mild constipation with beta blockade. Previously had 2 BMs daily now straining to go every day. No bloody or tarry stool. No CP/palp/SOB/GU c/o. Past Medical History  Diagnosis Date  . Hypertension   . History of migraine headaches   . Dyslipidemia 11/09/2012  . Hyperglycemia 11/09/2012  . Arthralgia 01/11/2013    Diffuse, b/l Hips R>L Knees, ankles, low back    Past Surgical History  Procedure Laterality Date  . No past surgeries      Family History  Problem Relation Age of Onset  . Arthritis Mother   . Hypertension Mother   . Arthritis Father   . Hypertension Father   . Diabetes Father   . Arthritis Maternal Grandmother   . Diabetes Maternal Grandmother   . Stroke Maternal Grandmother   . Arthritis Paternal Grandmother   . Diabetes Paternal Grandmother   . Diabetes Paternal Grandfather   . Heart disease Paternal Grandfather     MI  . Cancer Neg Hx   . Kidney disease Neg Hx   . Hyperlipidemia Brother   . Hypertension Brother   . Diabetes Brother   . Hyperlipidemia Daughter   . Diabetes Daughter   . Hypertension Daughter     History   Social History  . Marital Status: Married    Spouse Name: N/A    Number of Children: 4  . Years of Education: N/A   Occupational History  .  [employer]   Social History Main Topics  . Smoking status: Former Smoker -- 9 years    Types: Cigarettes  . Smokeless tobacco: Never  Used  . Alcohol Use: 3.6 - 6 oz/week    6-10 Cans of beer per week  . Drug Use: Not on file  . Sexually Active: Not on file   Other Topics Concern  . 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 amLODipine (NORVASC) 5 MG tablet Take 1 tablet (5 mg total) by mouth daily.  30 tablet  3  . carvedilol (COREG) 6.25 MG tablet Take 1 tablet (6.25 mg total) by mouth 2 (two) times daily with a meal.  60 tablet  2  . lisinopril-hydrochlorothiazide (ZESTORETIC) 20-12.5 MG per tablet Take 1 tablet by mouth 2 (two) times daily.  60 tablet  2   No current facility-administered medications on file prior to visit.    Allergies  Allergen Reactions  . Latex Rash    Review of Systems  Review of Systems  Constitutional: Negative for fever and malaise/fatigue.  HENT: Negative for congestion.   Eyes: Negative for discharge.  Respiratory: Negative for shortness of breath.   Cardiovascular: Negative for chest pain, palpitations and leg swelling.  Gastrointestinal: Positive for constipation. Negative for nausea, abdominal pain and diarrhea.  Genitourinary: Negative for dysuria.  Musculoskeletal: Positive for myalgias and back pain. Negative for falls.  Skin: Negative for rash.  Neurological: Negative for loss of consciousness and headaches.  Endo/Heme/Allergies: Negative for polydipsia.  Psychiatric/Behavioral: Negative for depression and suicidal ideas. The patient is not nervous/anxious and does not have insomnia.     Objective  BP 132/92  Pulse 72  Temp(Src) 98.7 F (37.1 C) (Oral)  Ht 6' 2.5" (1.892 m)  Wt 261 lb 1.3 oz (118.425 kg)  BMI 33.08 kg/m2  SpO2 96%  Physical  Exam  Physical Exam  Constitutional: He is oriented to person, place, and time and well-developed, well-nourished, and in no distress. No distress.  HENT:  Head: Normocephalic and atraumatic.  Eyes: Conjunctivae are normal.  Neck: Neck supple. No thyromegaly present.  Cardiovascular: Normal rate, regular rhythm and normal heart sounds.   No murmur heard. Pulmonary/Chest: Effort normal and breath sounds normal. No respiratory distress.  Abdominal: He exhibits no distension and no mass. There is no tenderness.  Musculoskeletal: He exhibits no edema.  Neurological: He is alert and oriented to person, place, and time.  Skin: Skin is warm.  Psychiatric: Memory, affect and judgment normal.    Lab Results  Component Value Date   TSH 1.016 01/08/2013   Lab Results  Component Value Date   WBC 7.3 01/08/2013   HGB 16.3 01/08/2013   HCT 46.1 01/08/2013   MCV 83.2 01/08/2013   PLT 207 01/08/2013   Lab Results  Component Value Date   CREATININE 1.12 01/08/2013   BUN 13 01/08/2013   NA 136 01/08/2013   K 4.3 01/08/2013   CL 100 01/08/2013   CO2 29 01/08/2013   Lab Results  Component Value Date   ALT 35 01/08/2013   AST 29 01/08/2013   ALKPHOS 51 01/08/2013   BILITOT 0.8 01/08/2013   Lab Results  Component Value Date   CHOL 167 01/08/2013   Lab Results  Component Value Date   HDL 38* 01/08/2013   Lab Results  Component Value Date   LDLCALC 87 01/08/2013   Lab Results  Component Value Date   TRIG 209* 01/08/2013   Lab Results  Component Value Date   CHOLHDL 4.4 01/08/2013     Assessment & Plan  Dyslipidemia TGs up, minimize simple carbs and saturated fats, avoid trans fats. Start a krill oil cap. Increase exercise.  HTN (hypertension) Well controlled on current meds, encouraged DASH diet. Add fiber and probiotics, increase hydration for the mild constipation  Arthralgia Labs normal, add krill oil increase exercise, Aleve or Advil prn.  Hyperglycemia Mild, avoid simple carbs  and increase exercise

## 2013-01-11 NOTE — Assessment & Plan Note (Signed)
Mild, avoid simple carbs and increase exercise

## 2013-01-11 NOTE — Assessment & Plan Note (Signed)
TGs up, minimize simple carbs and saturated fats, avoid trans fats. Start a krill oil cap. Increase exercise.

## 2013-01-11 NOTE — Assessment & Plan Note (Addendum)
Well controlled on current meds, encouraged DASH diet. Add fiber and probiotics, increase hydration for the mild constipation

## 2013-01-29 ENCOUNTER — Ambulatory Visit (INDEPENDENT_AMBULATORY_CARE_PROVIDER_SITE_OTHER): Payer: 59 | Admitting: Family

## 2013-01-29 ENCOUNTER — Encounter: Payer: Self-pay | Admitting: Family

## 2013-01-29 ENCOUNTER — Encounter: Payer: Self-pay | Admitting: Gastroenterology

## 2013-01-29 VITALS — BP 130/98 | HR 72 | Temp 98.5°F | Wt 260.0 lb

## 2013-01-29 DIAGNOSIS — R197 Diarrhea, unspecified: Secondary | ICD-10-CM

## 2013-01-29 LAB — CBC WITH DIFFERENTIAL/PLATELET
Basophils Absolute: 0.1 10*3/uL (ref 0.0–0.1)
Basophils Relative: 1 % (ref 0–1)
Eosinophils Absolute: 0.4 10*3/uL (ref 0.0–0.7)
Eosinophils Relative: 5 % (ref 0–5)
HCT: 43.8 % (ref 39.0–52.0)
Hemoglobin: 15.2 g/dL (ref 13.0–17.0)
Lymphocytes Relative: 29 % (ref 12–46)
Lymphs Abs: 2.3 10*3/uL (ref 0.7–4.0)
MCH: 29.2 pg (ref 26.0–34.0)
MCHC: 34.7 g/dL (ref 30.0–36.0)
MCV: 84.1 fL (ref 78.0–100.0)
Monocytes Absolute: 0.6 10*3/uL (ref 0.1–1.0)
Monocytes Relative: 7 % (ref 3–12)
Neutro Abs: 4.6 10*3/uL (ref 1.7–7.7)
Neutrophils Relative %: 58 % (ref 43–77)
Platelets: 240 10*3/uL (ref 150–400)
RBC: 5.21 MIL/uL (ref 4.22–5.81)
RDW: 14.8 % (ref 11.5–15.5)
WBC: 8 10*3/uL (ref 4.0–10.5)

## 2013-01-29 MED ORDER — DIPHENOXYLATE-ATROPINE 2.5-0.025 MG PO TABS: 1.0000 | ORAL_TABLET | Freq: Four times a day (QID) | ORAL | Status: DC | PRN

## 2013-01-29 NOTE — Assessment & Plan Note (Signed)
?   IBS.  Will obtain stool for c. Diff, culture, O and P.  Obtain cbc.  Add prn lomotil. Recommended that he start probiotic.  Will also refer to GI.  He is instructed to contact us right away if he develops fever >101, bloody stools, abdominal pain.  Pt verbalizes understanding.

## 2013-01-29 NOTE — Patient Instructions (Addendum)
Please go to lab prior to leaving. You will be contacted about your referral to Gastroenterology. Please let us know if you have not heard back within 1 week about your referral.

## 2013-01-29 NOTE — Progress Notes (Signed)
Subjective:    Patient ID: Robert [name], male    DOB: [birth year], 42 y.o.   MRN: [id number]  HPI  Robert [name] is a 42 yr old male who presents today with chief complaint of loose stools and abdominal cramping.  Symptoms started about 3 weeks ago. Reports that the cramping has subsided but he continues to have loose stools and abdominal bloating. He has used imodium which made cramping worse so he stopped.  Reports that he would have liquid bowel movement but did not get sense that he had emptied his bowels.  He continues to have bloating.  He reports that he is having 6-10 BM's a day.  After eating he has a BM.  Reports that food looks partially digested.  Symptoms seem to worsen with vegetables and bread.  Thinks that he may have had a fever the first week, but his thermometer showed normal temp. He denies BRBPR.  He reports that one weekend he had a dark stool but that resolved.  Denies recent abx, sick contacts, or travel outside of the country.    Reports that when he had his follow up with Dr. Abner Greenspan    Review of Systems See HPI  Past Medical History  Diagnosis Date  . Hypertension   . History of migraine headaches   . Dyslipidemia 11/09/2012  . Hyperglycemia 11/09/2012  . Arthralgia 01/11/2013    Diffuse, b/l Hips R>L Knees, ankles, low back    History   Social History  . Marital Status: Married    Spouse Name: N/A    Number of Children: 4  . Years of Education: N/A   Occupational History  .  [employer]   Social History Main Topics  . Smoking status: Former Smoker -- 9 years    Types: Cigarettes  . Smokeless tobacco: Never Used  . Alcohol Use: 3.6 - 6 oz/week    6-10 Cans of beer per week  . Drug Use: Not on file  . Sexually Active: Not on file   Other Topics Concern  . 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 No past surgeries      Family History  Problem Relation Age of Onset  . Arthritis Mother   . Hypertension Mother   . Arthritis Father   . Hypertension Father   . Diabetes Father   . Arthritis Maternal Grandmother   . Diabetes Maternal Grandmother   . Stroke Maternal Grandmother   . Arthritis Paternal Grandmother   . Diabetes Paternal Grandmother   . Diabetes Paternal Grandfather   . Heart disease Paternal Grandfather     MI  . Cancer Neg Hx   . Kidney disease Neg Hx   . Hyperlipidemia Brother   . Hypertension Brother   . Diabetes Brother   . Hyperlipidemia Daughter   . Diabetes Daughter   . Hypertension Daughter     Allergies  Allergen Reactions  . Latex Rash    Current Outpatient Prescriptions on File Prior to Visit  Medication Sig Dispense Refill  . amLODipine (NORVASC) 5 MG tablet Take 1 tablet (5 mg  total) by mouth daily.  30 tablet  3  . carvedilol (COREG) 6.25 MG tablet Take 1 tablet (6.25 mg total) by mouth 2 (two) times daily with a meal.  60 tablet  2  . lisinopril-hydrochlorothiazide (ZESTORETIC) 20-12.5 MG per tablet Take 1 tablet by mouth 2 (two) times daily.  60 tablet  2   No current facility-administered medications on file prior to visit.    BP 130/98  Pulse 72  Temp(Src) 98.5 F (36.9 C) (Oral)  Wt 260 lb (117.935 kg)  BMI 32.95 kg/m2  SpO2 97%       Objective:   Physical Exam  Constitutional: He is oriented to person, place, and time. He appears well-developed and well-nourished. No distress.  HENT:  Head: Normocephalic and atraumatic.  Cardiovascular: Normal rate and regular rhythm.   No murmur heard. Pulmonary/Chest: Effort normal and breath sounds normal. No respiratory distress. He has no wheezes. He has no rales. He exhibits no tenderness.  Abdominal: Soft. Bowel sounds are normal. He exhibits no distension and  no mass. There is no tenderness. There is no rebound and no guarding.  Musculoskeletal: He exhibits no edema.  Lymphadenopathy:    He has no cervical adenopathy.  Neurological: He is alert and oriented to person, place, and time.  Psychiatric: He has a normal mood and affect. His behavior is normal. Judgment and thought content normal.          Assessment & Plan:   BP Readings from Last 3 Encounters:  01/29/13 130/98  01/11/13 132/92  12/08/12 158/110

## 2013-02-02 ENCOUNTER — Telehealth: Payer: Self-pay | Admitting: Gastroenterology

## 2013-02-02 ENCOUNTER — Ambulatory Visit: Payer: 59 | Admitting: Gastroenterology

## 2013-02-02 LAB — OVA AND PARASITE SCREEN: OP: NONE SEEN

## 2013-02-02 LAB — CLOSTRIDIUM DIFFICILE BY PCR: Toxigenic C. Difficile by PCR: NOT DETECTED

## 2013-02-02 NOTE — Telephone Encounter (Signed)
Do not charge per Dr. Stark. 

## 2013-02-03 LAB — STOOL CULTURE

## 2013-02-23 ENCOUNTER — Encounter: Payer: Self-pay | Admitting: Gastroenterology

## 2013-02-23 ENCOUNTER — Ambulatory Visit (INDEPENDENT_AMBULATORY_CARE_PROVIDER_SITE_OTHER): Payer: 59 | Admitting: Gastroenterology

## 2013-02-23 VITALS — BP 126/94 | HR 68 | Ht 73.5 in | Wt 265.2 lb

## 2013-02-23 DIAGNOSIS — R197 Diarrhea, unspecified: Secondary | ICD-10-CM

## 2013-02-23 DIAGNOSIS — R109 Unspecified abdominal pain: Secondary | ICD-10-CM

## 2013-02-23 DIAGNOSIS — R066 Hiccough: Secondary | ICD-10-CM

## 2013-02-23 MED ORDER — OMEPRAZOLE 20 MG PO CPDR: 20.0000 mg | DELAYED_RELEASE_CAPSULE | Freq: Every day | ORAL | Status: DC

## 2013-02-23 MED ORDER — HYOSCYAMINE SULFATE 0.125 MG SL SUBL: SUBLINGUAL_TABLET | SUBLINGUAL | Status: DC

## 2013-02-23 MED ORDER — PEG-KCL-NACL-NASULF-NA ASC-C 100 G PO SOLR: 1.0000 | Freq: Once | ORAL | Status: DC

## 2013-02-23 NOTE — Patient Instructions (Addendum)
We have sent the following medications to your pharmacy for you to pick up at your convenience: Levsin and omeprazole.  Patient advised to avoid spicy, acidic, citrus, chocolate, mints, fruit and fruit juices.  Limit the intake of caffeine, alcohol and Soda.  Don't exercise too soon after eating.  Don't lie down within 3-4 hours of eating.  Elevate the head of your bed.  You have been scheduled for a colonoscopy with propofol. Please follow written instructions given to you at your visit today.  Please pick up your prep kit at the pharmacy within the next 1-3 days. If you use inhalers (even only as needed), please bring them with you on the day of your procedure. Your physician has requested that you go to www.startemmi.com and enter the access code given to you at your visit today. This web site gives a general overview about your procedure. However, you should still follow specific instructions given to you by our office regarding your preparation for the procedure. Thank you for choosing me and [facility].  Robert [name]. Pleas Koch., MD., Clementeen Graham

## 2013-02-23 NOTE — Progress Notes (Signed)
History of Present Illness: This is a 42 year old male who relates new GI symptoms beginning in June. He developed frequent loose postprandial bowel movements associated with left-sided abdominal cramping and bloating. Blood work and stool studies were unremarkable. He relates no history of antibiotics or medication changes the past few months. He has noted occasional heartburn symptoms and frequent problems with hiccups generally occurring on a daily basis for the past few weeks. He has a persistent nagging cough. Denies weight loss, constipation, change in stool caliber, melena, hematochezia, nausea, vomiting, dysphagia, chest pain.  Review of Systems: Pertinent positive and negative review of systems were noted in the above HPI section. All other review of systems were otherwise negative.  Current Medications, Allergies, Past Medical History, Past Surgical History, Family History and Social History were reviewed in [employer] record.  Physical Exam: General: Well developed , well nourished, no acute distress Head: Normocephalic and atraumatic Eyes:  sclerae anicteric, EOMI Ears: Normal auditory acuity Mouth: No deformity or lesions Neck: Supple, no masses or thyromegaly Lungs: Clear throughout to auscultation Heart: Regular rate and rhythm; no murmurs, rubs or bruits Abdomen: Soft, non tender and non distended. No masses, hepatosplenomegaly or hernias noted. Normal Bowel sounds Rectal: Deferred to colonoscopy Musculoskeletal: Symmetrical with no gross deformities  Skin: No lesions on visible extremities Pulses:  Normal pulses noted Extremities: No clubbing, cyanosis, edema or deformities noted Neurological: Alert oriented x 4, grossly nonfocal Cervical Nodes:  No significant cervical adenopathy Inguinal Nodes: No significant inguinal adenopathy Psychological:  Alert and cooperative. Normal mood and affect  Assessment and Recommendations:  1. Diarrhea associated  with abdominal pain and bloating. Suspected irritable bowel syndrome. Need to exclude IBD and colorectal neoplasms. Trial of a lactose avoidance diet. Trial of hyoscyamine as needed. Schedule colonoscopy. The risks, benefits, and alternatives to colonoscopy with possible biopsy and possible polypectomy were discussed with the patient and they consent to proceed.   2. Frequent cough. Advised him to discuss Zestoretic with his PCP.   3. Suspected GERD with hiccups and heartburn. Begin omeprazole 20 mg daily and standard antireflux measures. Symptoms do not respond adequately consider upper endoscopy for further evaluation.

## 2013-03-18 ENCOUNTER — Telehealth: Payer: Self-pay

## 2013-03-18 DIAGNOSIS — I10 Essential (primary) hypertension: Secondary | ICD-10-CM

## 2013-03-18 MED ORDER — CARVEDILOL 6.25 MG PO TABS: 6.2500 mg | ORAL_TABLET | Freq: Two times a day (BID) | ORAL | Status: DC

## 2013-03-18 MED ORDER — LISINOPRIL-HYDROCHLOROTHIAZIDE 20-12.5 MG PO TABS: 1.0000 | ORAL_TABLET | Freq: Two times a day (BID) | ORAL | Status: DC

## 2013-03-18 MED ORDER — AMLODIPINE BESYLATE 5 MG PO TABS: 5.0000 mg | ORAL_TABLET | Freq: Every day | ORAL | Status: DC

## 2013-03-18 NOTE — Telephone Encounter (Signed)
Pt states this needs to go to [facility] in [place]

## 2013-03-18 NOTE — Telephone Encounter (Signed)
Pt left a vm stating he needed his BP medication refilled but would need it sent to a pharmacy in [place].  Left a message asking that pt calls me back with the pharmacy name, address and city in [place] so i can send the RX

## 2013-04-28 ENCOUNTER — Other Ambulatory Visit: Payer: Self-pay

## 2013-04-28 DIAGNOSIS — I10 Essential (primary) hypertension: Secondary | ICD-10-CM

## 2013-04-28 MED ORDER — CARVEDILOL 6.25 MG PO TABS: 6.2500 mg | ORAL_TABLET | Freq: Two times a day (BID) | ORAL | Status: DC

## 2013-04-28 MED ORDER — LISINOPRIL-HYDROCHLOROTHIAZIDE 20-12.5 MG PO TABS: 1.0000 | ORAL_TABLET | Freq: Two times a day (BID) | ORAL | Status: DC

## 2013-04-28 MED ORDER — AMLODIPINE BESYLATE 5 MG PO TABS: 5.0000 mg | ORAL_TABLET | Freq: Every day | ORAL | Status: DC

## 2013-05-04 ENCOUNTER — Ambulatory Visit: Payer: 59 | Admitting: Family Medicine

## 2013-05-12 ENCOUNTER — Encounter: Payer: 59 | Admitting: Gastroenterology

## 2013-06-18 ENCOUNTER — Other Ambulatory Visit: Payer: Self-pay

## 2013-06-23 ENCOUNTER — Other Ambulatory Visit: Payer: Self-pay | Admitting: *Deleted

## 2013-06-23 DIAGNOSIS — I10 Essential (primary) hypertension: Secondary | ICD-10-CM

## 2013-06-23 MED ORDER — LISINOPRIL-HYDROCHLOROTHIAZIDE 20-12.5 MG PO TABS: 1.0000 | ORAL_TABLET | Freq: Two times a day (BID) | ORAL | Status: DC

## 2013-06-23 MED ORDER — AMLODIPINE BESYLATE 5 MG PO TABS: 5.0000 mg | ORAL_TABLET | Freq: Every day | ORAL | Status: DC

## 2013-06-23 MED ORDER — CARVEDILOL 6.25 MG PO TABS: 6.2500 mg | ORAL_TABLET | Freq: Two times a day (BID) | ORAL | Status: DC

## 2013-06-23 NOTE — Telephone Encounter (Signed)
Rx request to pharmacy, 30-day supply;*PATIENT DUE FOR FOLLOW-UP OFFICE VISIT*/SLS

## 2013-07-28 ENCOUNTER — Telehealth: Payer: Self-pay | Admitting: Family Medicine

## 2013-07-28 DIAGNOSIS — I10 Essential (primary) hypertension: Secondary | ICD-10-CM

## 2013-07-28 MED ORDER — LISINOPRIL-HYDROCHLOROTHIAZIDE 20-12.5 MG PO TABS: 1.0000 | ORAL_TABLET | Freq: Two times a day (BID) | ORAL | Status: DC

## 2013-07-28 NOTE — Telephone Encounter (Signed)
Patient called to have his bp med sent to the [facility] in [place]

## 2013-07-31 ENCOUNTER — Telehealth: Payer: Self-pay | Admitting: Family Medicine

## 2013-07-31 DIAGNOSIS — I10 Essential (primary) hypertension: Secondary | ICD-10-CM

## 2013-07-31 NOTE — Telephone Encounter (Signed)
States only one of his BP meds was sent to [facility], Request call back

## 2013-08-04 ENCOUNTER — Other Ambulatory Visit: Payer: Self-pay | Admitting: Family Medicine

## 2013-08-04 MED ORDER — AMLODIPINE BESYLATE 5 MG PO TABS: 5.0000 mg | ORAL_TABLET | Freq: Every day | ORAL | Status: DC

## 2013-08-04 MED ORDER — CARVEDILOL 6.25 MG PO TABS: 6.2500 mg | ORAL_TABLET | Freq: Two times a day (BID) | ORAL | Status: DC

## 2013-09-05 ENCOUNTER — Telehealth: Payer: Self-pay | Admitting: Family Medicine

## 2013-09-05 DIAGNOSIS — I10 Essential (primary) hypertension: Secondary | ICD-10-CM

## 2013-09-05 NOTE — Telephone Encounter (Signed)
Amlodipine 5 mg lisinopril 20-12.5 carvedilol 6.25 mg  Patient states he needs refills on all three sent to his pharmacy in [place] that is on file

## 2013-09-07 MED ORDER — AMLODIPINE BESYLATE 5 MG PO TABS: 5.0000 mg | ORAL_TABLET | Freq: Every day | ORAL | Status: DC

## 2013-09-07 MED ORDER — CARVEDILOL 6.25 MG PO TABS: 6.2500 mg | ORAL_TABLET | Freq: Two times a day (BID) | ORAL | Status: DC

## 2013-09-07 MED ORDER — LISINOPRIL-HYDROCHLOROTHIAZIDE 20-12.5 MG PO TABS: 1.0000 | ORAL_TABLET | Freq: Two times a day (BID) | ORAL | Status: DC

## 2013-09-07 NOTE — Telephone Encounter (Signed)
Left detailed message informing patient of medication refill and that he needs to be seen before further refills can be given. °

## 2013-09-07 NOTE — Telephone Encounter (Signed)
1 month supply sent to pharmacy. Please call and inform pt that there will be no more refills until pt is seen.

## 2014-01-20 ENCOUNTER — Ambulatory Visit (INDEPENDENT_AMBULATORY_CARE_PROVIDER_SITE_OTHER): Payer: 59 | Admitting: Family Medicine

## 2014-01-20 ENCOUNTER — Encounter: Payer: Self-pay | Admitting: Family Medicine

## 2014-01-20 ENCOUNTER — Telehealth: Payer: Self-pay | Admitting: Family Medicine

## 2014-01-20 VITALS — BP 158/102 | HR 63 | Temp 98.6°F | Ht 74.5 in | Wt 283.0 lb

## 2014-01-20 DIAGNOSIS — R351 Nocturia: Secondary | ICD-10-CM

## 2014-01-20 DIAGNOSIS — E785 Hyperlipidemia, unspecified: Secondary | ICD-10-CM

## 2014-01-20 DIAGNOSIS — R7309 Other abnormal glucose: Secondary | ICD-10-CM

## 2014-01-20 DIAGNOSIS — R0989 Other specified symptoms and signs involving the circulatory and respiratory systems: Secondary | ICD-10-CM

## 2014-01-20 DIAGNOSIS — R739 Hyperglycemia, unspecified: Secondary | ICD-10-CM

## 2014-01-20 DIAGNOSIS — R5383 Other fatigue: Secondary | ICD-10-CM

## 2014-01-20 DIAGNOSIS — G479 Sleep disorder, unspecified: Secondary | ICD-10-CM

## 2014-01-20 DIAGNOSIS — E669 Obesity, unspecified: Secondary | ICD-10-CM

## 2014-01-20 DIAGNOSIS — I10 Essential (primary) hypertension: Secondary | ICD-10-CM

## 2014-01-20 DIAGNOSIS — R5381 Other malaise: Secondary | ICD-10-CM

## 2014-01-20 DIAGNOSIS — Z0000 Encounter for general adult medical examination without abnormal findings: Secondary | ICD-10-CM

## 2014-01-20 DIAGNOSIS — R0683 Snoring: Secondary | ICD-10-CM

## 2014-01-20 DIAGNOSIS — R0609 Other forms of dyspnea: Secondary | ICD-10-CM

## 2014-01-20 LAB — RENAL FUNCTION PANEL
Albumin: 4.1 g/dL (ref 3.5–5.2)
BUN: 11 mg/dL (ref 6–23)
CO2: 23 mEq/L (ref 19–32)
Calcium: 9.1 mg/dL (ref 8.4–10.5)
Chloride: 106 mEq/L (ref 96–112)
Creat: 1.04 mg/dL (ref 0.50–1.35)
Glucose, Bld: 101 mg/dL — ABNORMAL HIGH (ref 70–99)
Phosphorus: 3.4 mg/dL (ref 2.3–4.6)
Potassium: 3.8 mEq/L (ref 3.5–5.3)
Sodium: 138 mEq/L (ref 135–145)

## 2014-01-20 LAB — HEPATIC FUNCTION PANEL
ALT: 38 U/L (ref 0–53)
AST: 31 U/L (ref 0–37)
Albumin: 4.1 g/dL (ref 3.5–5.2)
Alkaline Phosphatase: 52 U/L (ref 39–117)
Bilirubin, Direct: 0.1 mg/dL (ref 0.0–0.3)
Indirect Bilirubin: 0.5 mg/dL (ref 0.2–1.2)
Total Bilirubin: 0.6 mg/dL (ref 0.2–1.2)
Total Protein: 6.5 g/dL (ref 6.0–8.3)

## 2014-01-20 LAB — TSH: TSH: 0.781 u[IU]/mL (ref 0.350–4.500)

## 2014-01-20 LAB — CBC
HCT: 43.6 % (ref 39.0–52.0)
Hemoglobin: 15.6 g/dL (ref 13.0–17.0)
MCH: 29.4 pg (ref 26.0–34.0)
MCHC: 35.8 g/dL (ref 30.0–36.0)
MCV: 82.3 fL (ref 78.0–100.0)
Platelets: 215 10*3/uL (ref 150–400)
RBC: 5.3 MIL/uL (ref 4.22–5.81)
RDW: 14.6 % (ref 11.5–15.5)
WBC: 7.2 10*3/uL (ref 4.0–10.5)

## 2014-01-20 LAB — LIPID PANEL
Cholesterol: 137 mg/dL (ref 0–200)
HDL: 32 mg/dL — ABNORMAL LOW (ref >39)
LDL Cholesterol: 77 mg/dL (ref 0–99)
Total CHOL/HDL Ratio: 4.3 Ratio
Triglycerides: 142 mg/dL (ref <150)
VLDL: 28 mg/dL (ref 0–40)

## 2014-01-20 LAB — HEMOGLOBIN A1C
Hgb A1c MFr Bld: 6.5 % — ABNORMAL HIGH (ref <5.7)
Mean Plasma Glucose: 140 mg/dL — ABNORMAL HIGH (ref <117)

## 2014-01-20 MED ORDER — LISINOPRIL-HYDROCHLOROTHIAZIDE 20-12.5 MG PO TABS: 1.0000 | ORAL_TABLET | Freq: Two times a day (BID) | ORAL | Status: DC

## 2014-01-20 MED ORDER — AMLODIPINE BESYLATE 5 MG PO TABS: 5.0000 mg | ORAL_TABLET | Freq: Every day | ORAL | Status: DC

## 2014-01-20 MED ORDER — CARVEDILOL 6.25 MG PO TABS: 6.2500 mg | ORAL_TABLET | Freq: Two times a day (BID) | ORAL | Status: DC

## 2014-01-20 NOTE — Telephone Encounter (Signed)
Relevant patient education assigned to patient using Emmi. ° °

## 2014-01-20 NOTE — Progress Notes (Signed)
Patient ID: Robert [name], male   DOB: 1971-04-18, 43 y.o.   MRN: [id number] Robert [name] [id number] Dec 06, 1970 01/20/2014      Progress Note-Follow Up  Subjective  Chief Complaint  Chief Complaint  Patient presents with  . Annual Exam    physical    HPI  Patient is a 43 year old male in today for routine medical care. Patient is in today for annual exam and to restart medications. He had not been in for quite some time. Has not been taking 2 of his 3 antihypertensives. Does have some low grade headache but that is ongoing. He noticed his wife says he has trouble with significant snoring, restless sleep. He wakes up with headaches and fatigue. Has been under a great deal of stress with his job. Is complaining of some numbness and discomfort in his hands left greater than right. Some right shoulder pain as well. Acknowledges he has not been eating well. Denies CP/palp/SOB/HA/congestion/fevers/GI or GU c/o. Taking meds as prescribed  Past Medical History  Diagnosis Date  . Hypertension   . History of migraine headaches   . Dyslipidemia 11/09/2012  . Hyperglycemia 11/09/2012  . Arthralgia 01/11/2013    Diffuse, b/l Hips R>L Knees, ankles, low back    Past Surgical History  Procedure Laterality Date  . No past surgeries      Family History  Problem Relation Age of Onset  . Arthritis Mother   . Hypertension Mother   . Arthritis Father   . Hypertension Father   . Diabetes Father   . Arthritis Maternal Grandmother   . Diabetes Maternal Grandmother   . Stroke Maternal Grandmother   . Arthritis Paternal Grandmother   . Diabetes Paternal Grandmother   . Diabetes Paternal Grandfather   . Heart disease Paternal Grandfather     MI  . Cancer Neg Hx   . Kidney disease Neg Hx   . Hyperlipidemia Brother   . Hypertension Brother   . Diabetes Brother     History   Social History  . Marital Status: Married    Spouse Name: N/A    Number of Children: 4  . Years of Education: N/A    Occupational History  . resturant manager [employer]   Social History Main Topics  . Smoking status: Former Smoker -- 9 years    Types: Cigarettes    Quit date: 08/14/1995  . Smokeless tobacco: Never Used  . Alcohol Use: 3.6 - 6.0 oz/week    6-10 Cans of beer per week  . Drug Use: No  . Sexual Activity: Not on file   Other Topics Concern  . 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 omeprazole (PRILOSEC) 20 MG capsule Take 1 capsule (20 mg total) by mouth daily.  30 capsule  11  . amLODipine (NORVASC) 5 MG tablet Take 1 tablet (5 mg total) by mouth daily.  30 tablet  0  . carvedilol (COREG) 6.25 MG tablet Take 1 tablet (6.25 mg total) by mouth 2 (two) times daily with a meal.  60  tablet  0  . lisinopril-hydrochlorothiazide (PRINZIDE,ZESTORETIC) 20-12.5 MG per tablet Take 1 tablet by mouth 2 (two) times daily.  60 tablet  0   No current facility-administered medications on file prior to visit.    Allergies  Allergen Reactions  . Latex Rash    Review of Systems  Review of Systems  Constitutional: Negative for fever and malaise/fatigue.  HENT: Negative for congestion.   Eyes: Negative for discharge.  Respiratory: Negative for shortness of breath.   Cardiovascular: Negative for chest pain, palpitations and leg swelling.  Gastrointestinal: Negative for nausea, abdominal pain and diarrhea.  Genitourinary: Negative for dysuria.  Musculoskeletal: Negative for falls.  Skin: Negative for rash.  Neurological: Negative for loss of consciousness and headaches.  Endo/Heme/Allergies: Negative for polydipsia.  Psychiatric/Behavioral: Negative for depression and suicidal  ideas. The patient is not nervous/anxious and does not have insomnia.     Objective  BP 178/130  Pulse 63  Temp(Src) 98.6 F (37 C) (Oral)  Ht 6' 2.5" (1.892 m)  Wt 283 lb (128.368 kg)  BMI 35.86 kg/m2  SpO2 96%  Physical Exam  Physical Exam  Constitutional: He is oriented to person, place, and time and well-developed, well-nourished, and in no distress. No distress.  HENT:  Head: Normocephalic and atraumatic.  Eyes: Conjunctivae are normal.  Neck: Neck supple. No thyromegaly present.  Cardiovascular: Normal rate, regular rhythm and normal heart sounds.   No murmur heard. Pulmonary/Chest: Effort normal and breath sounds normal. No respiratory distress.  Abdominal: He exhibits no distension and no mass. There is no tenderness.  Musculoskeletal: He exhibits no edema.  Neurological: He is alert and oriented to person, place, and time.  Skin: Skin is warm.  Psychiatric: Memory, affect and judgment normal.    Lab Results  Component Value Date   TSH 1.016 01/08/2013   Lab Results  Component Value Date   WBC 8.0 01/29/2013   HGB 15.2 01/29/2013   HCT 43.8 01/29/2013   MCV 84.1 01/29/2013   PLT 240 01/29/2013   Lab Results  Component Value Date   CREATININE 1.12 01/08/2013   BUN 13 01/08/2013   NA 136 01/08/2013   K 4.3 01/08/2013   CL 100 01/08/2013   CO2 29 01/08/2013   Lab Results  Component Value Date   ALT 35 01/08/2013   AST 29 01/08/2013   ALKPHOS 51 01/08/2013   BILITOT 0.8 01/08/2013   Lab Results  Component Value Date   CHOL 167 01/08/2013   Lab Results  Component Value Date   HDL 38* 01/08/2013   Lab Results  Component Value Date   LDLCALC 87 01/08/2013   Lab Results  Component Value Date   TRIG 209* 01/08/2013   Lab Results  Component Value Date   CHOLHDL 4.4 01/08/2013     Assessment & Plan   HTN (hypertension) Ran out of meds because he was unable to get in for appt. Restart meds and reassess in a couple weeks or as  needed  Hyperglycemia hgba1c acceptable, minimize simple carbs. Increase exercise as tolerated.  Dyslipidemia Encouraged heart healthy diet, increase exercise, avoid trans fats, consider a krill oil cap daily  Obesity, unspecified Encouraged DASH diet, decrease po intake and increase exercise as tolerated. Needs 7-8 hours of sleep nightly. Avoid trans fats, eat small, frequent meals every 4-5 hours with lean proteins, complex carbs and healthy fats. Minimize simple carbs  Snoring Restless sleep and excessive fatigue, obesity and HTN, agrees to split night sleep study.  Annual  physical exam Patient encouraged to maintain heart healthy diet, regular exercise, adequate sleep. Consider daily probiotics. Take medications as prescribed

## 2014-01-20 NOTE — Progress Notes (Signed)
Pre visit review using our clinic review tool, if applicable. No additional management support is needed unless otherwise documented below in the visit note. 

## 2014-01-20 NOTE — Patient Instructions (Addendum)
Salon Pas patches  Basic Carbohydrate Counting Basic carbohydrate counting is a way to plan meals. It is done by counting the amount of carbohydrate in foods. Foods that have carbohydrates are starches (grains, beans, starchy vegetables) and sweets. Eating carbohydrates increases blood glucose (sugar) levels. People with diabetes use carbohydrate counting to help keep their blood glucose at a normal level.  COUNTING CARBOHYDRATES IN FOODS The first step in counting carbohydrates is to learn how many carbohydrate servings you should have in every meal. A dietitian can plan this for you. After learning the amount of carbohydrates to include in your meal plan, you can start to choose the carbohydrate-containing foods you want to eat.  There are 2 ways to identify the amount of carbohydrates in the foods you eat.  Read the Nutrition Facts panel on food labels. You need 2 pieces of information from the Nutrition Facts panel to count carbohydrates this way:  Serving size.  Total carbohydrate (in grams). Decide how many servings you will be eating. If it is 1 serving, you will be eating the amount of carbohydrate listed on the panel. If you will be eating 2 servings, you will be eating double the amount of carbohydrate listed on the panel.   Learn serving sizes. A serving size of most carbohydrate-containing foods is about 15 grams (g). Listed below are single serving sizes of common carbohydrate-containing foods:  1 slice bread.   cup unsweetened, dry cereal.   cup hot cereal.   cup rice.   cup mashed potatoes.   cup pasta.  1 cup fresh fruit.   cup canned fruit.  1 cup milk (whole, 2%, or skim).   cup starchy vegetables (peas, corn, or potatoes). Counting carbohydrates this way is similar to looking on the Nutrition Facts panel. Decide how many servings you will eat first. Multiply the number of servings you eat by 15 g. For example, if you have 2 cups of strawberries, you had 2  servings. That means you had 30 g of carbohydrate (2 servings x 15 g = 30 g). CALCULATING CARBOHYDRATES IN A MEAL Sample dinner  3 oz chicken breast.   cup brown rice.   cup corn.  1 cup fat-free milk.  1 cup strawberries with sugar-free whipped topping. Carbohydrate calculation First, identify the foods that contain carbohydrate:  Rice.  Corn.  Milk.  Strawberries. Calculate the number of servings eaten:  2 servings rice.  1 serving corn.  1 serving milk.  1 serving strawberries. Multiply the number of servings by 15 g:  2 servings rice x 15 g = 30 g.  1 serving corn x 15 g = 15 g.  1 serving milk x 15 g = 15 g.  1 serving strawberries x 15 g = 15 g. Add the amounts to find the total carbohydrates eaten: 30 g + 15 g + 15 g + 15 g = 75 g carbohydrate eaten at dinner. Document Released: 07/30/2005 Document Revised: 10/22/2011 Document Reviewed: 06/15/2011 [facility] Patient Information 2014 [place], [place].

## 2014-01-27 DIAGNOSIS — E669 Obesity, unspecified: Secondary | ICD-10-CM | POA: Insufficient documentation

## 2014-01-27 DIAGNOSIS — R0683 Snoring: Secondary | ICD-10-CM | POA: Insufficient documentation

## 2014-01-27 NOTE — Assessment & Plan Note (Signed)
Ran out of meds because he was unable to get in for appt. Restart meds and reassess in a couple weeks or as needed

## 2014-01-27 NOTE — Assessment & Plan Note (Signed)
Encouraged heart healthy diet, increase exercise, avoid trans fats, consider a krill oil cap daily 

## 2014-01-27 NOTE — Assessment & Plan Note (Signed)
Patient encouraged to maintain heart healthy diet, regular exercise, adequate sleep. Consider daily probiotics. Take medications as prescribed 

## 2014-01-27 NOTE — Assessment & Plan Note (Signed)
Encouraged DASH diet, decrease po intake and increase exercise as tolerated. Needs 7-8 hours of sleep nightly. Avoid trans fats, eat small, frequent meals every 4-5 hours with lean proteins, complex carbs and healthy fats. Minimize simple carbs 

## 2014-01-27 NOTE — Assessment & Plan Note (Signed)
Restless sleep and excessive fatigue, obesity and HTN, agrees to split night sleep study.

## 2014-01-27 NOTE — Assessment & Plan Note (Signed)
hgba1c acceptable, minimize simple carbs. Increase exercise as tolerated.  

## 2014-02-03 ENCOUNTER — Encounter: Payer: 59 | Admitting: Family Medicine

## 2014-02-16 ENCOUNTER — Ambulatory Visit (INDEPENDENT_AMBULATORY_CARE_PROVIDER_SITE_OTHER): Payer: 59 | Admitting: Family Medicine

## 2014-02-16 ENCOUNTER — Encounter: Payer: Self-pay | Admitting: Family Medicine

## 2014-02-16 VITALS — BP 136/100 | HR 75

## 2014-02-16 VITALS — BP 132/98 | HR 75 | Temp 98.4°F | Ht 74.5 in

## 2014-02-16 DIAGNOSIS — E669 Obesity, unspecified: Secondary | ICD-10-CM

## 2014-02-16 DIAGNOSIS — I10 Essential (primary) hypertension: Secondary | ICD-10-CM

## 2014-02-16 MED ORDER — CARVEDILOL 6.25 MG PO TABS
6.2500 mg | ORAL_TABLET | Freq: Two times a day (BID) | ORAL | Status: DC
Start: 2014-02-16 — End: 2014-06-02

## 2014-02-16 NOTE — Progress Notes (Signed)
Subjective:     Patient ID: Robert [name], male   DOB: [birth year], 43 y.o.   MRN: [id number]  HPI Comments: Pt is a 43yo Caucasian male who presents today for BP check with the nurse. BP elevated 132/98, 136/100. Pt sts he is exercising and following low salt diet as recommended. Pt denies cp, shortness of breath, dizziness, H/A, vision changes.     Review of Systems  Constitutional: Negative for fever, chills and fatigue.  HENT: Negative for congestion, ear pain, nosebleeds and tinnitus.   Eyes: Negative for photophobia and visual disturbance.  Respiratory: Negative for cough, chest tightness and shortness of breath.   Cardiovascular: Negative for chest pain, palpitations and leg swelling.  Gastrointestinal: Negative for nausea, vomiting and abdominal pain.  Endocrine: Negative for polydipsia, polyphagia and polyuria.  Musculoskeletal: Negative for arthralgias and myalgias.  Skin: Negative for color change.  Neurological: Negative for dizziness, syncope, light-headedness and headaches.       Objective:   Physical Exam  Constitutional: He is oriented to person, place, and time. He appears well-developed and well-nourished.  Neck: Normal range of motion.  Cardiovascular: Normal rate, regular rhythm and normal heart sounds.  Exam reveals no gallop and no friction rub.   No murmur heard. Pulmonary/Chest: Effort normal and breath sounds normal. No respiratory distress. He has no wheezes. He has no rales.  Musculoskeletal: Normal range of motion. He exhibits no edema.  Neurological: He is alert and oriented to person, place, and time. Coordination normal.  Skin: Skin is warm and dry.  Psychiatric: He has a normal mood and affect. His behavior is normal.       Assessment:    Pt is well appearing and in no acute distress.     Plan:    Pt will continue to exercise and follow DASH diet. Increase carvedilol 6.25mg  from bid to tid and will f/u in 4-6 weeks for BP check. Pt advised to call/  return sooner if symptoms of hypotension present.   Patient seen and examined with Student, agree with above note.  HTN (hypertension) Poorly controlled will alter medications, encouraged DASH diet, minimize caffeine and obtain adequate sleep. Report concerning symptoms and follow up as directed and as needed. Will increase Carvedilol dose to tid at same strength and reassess at next visit  Obesity, unspecified Encouraged DASH diet, decrease po intake and increase exercise as tolerated. Needs 7-8 hours of sleep nightly. Avoid trans fats, eat small, frequent meals every 4-5 hours with lean proteins, complex carbs and healthy fats. Minimize simple carbs, GMO foods.

## 2014-02-16 NOTE — Progress Notes (Signed)
Pre visit review using our clinic review tool, if applicable. No additional management support is needed unless otherwise documented below in the visit note. 

## 2014-02-16 NOTE — Patient Instructions (Signed)
bp check 4-6 weeks with nurse    DASH Eating Plan DASH stands for "Dietary Approaches to Stop Hypertension." The DASH eating plan is a healthy eating plan that has been shown to reduce high blood pressure (hypertension). Additional health benefits may include reducing the risk of type 2 diabetes mellitus, heart disease, and stroke. The DASH eating plan may also help with weight loss. WHAT DO I NEED TO KNOW ABOUT THE DASH EATING PLAN? For the DASH eating plan, you will follow these general guidelines:  Choose foods with a percent daily value for sodium of less than 5% (as listed on the food label).  Use salt-free seasonings or herbs instead of table salt or sea salt.  Check with your health care provider or pharmacist before using salt substitutes.  Eat lower-sodium products, often labeled as "lower sodium" or "no salt added."  Eat fresh foods.  Eat more vegetables, fruits, and low-fat dairy products.  Choose whole grains. Look for the word "whole" as the first word in the ingredient list.  Choose fish and skinless chicken or [place] more often than red meat. Limit fish, poultry, and meat to 6 oz (170 g) each day.  Limit sweets, desserts, sugars, and sugary drinks.  Choose heart-healthy fats.  Limit cheese to 1 oz (28 g) per day.  Eat more home-cooked food and less restaurant, buffet, and fast food.  Limit fried foods.  Cook foods using methods other than frying.  Limit canned vegetables. If you do use them, rinse them well to decrease the sodium.  When eating at a restaurant, ask that your food be prepared with less salt, or no salt if possible. WHAT FOODS CAN I EAT? Seek help from a dietitian for individual calorie needs. Grains Whole grain or whole wheat bread. Brown rice. Whole grain or whole wheat pasta. Quinoa, bulgur, and whole grain cereals. Low-sodium cereals. Corn or whole wheat flour tortillas. Whole grain cornbread. Whole grain crackers. Low-sodium  crackers. Vegetables Fresh or frozen vegetables (raw, steamed, roasted, or grilled). Low-sodium or reduced-sodium tomato and vegetable juices. Low-sodium or reduced-sodium tomato sauce and paste. Low-sodium or reduced-sodium canned vegetables.  Fruits All fresh, canned (in natural juice), or frozen fruits. Meat and Other Protein Products Ground beef (85% or leaner), grass-fed beef, or beef trimmed of fat. Skinless chicken or [place]. Ground chicken or [place]. Pork trimmed of fat. All fish and seafood. Eggs. Dried beans, peas, or lentils. Unsalted nuts and seeds. Unsalted canned beans. Dairy Low-fat dairy products, such as skim or 1% milk, 2% or reduced-fat cheeses, low-fat ricotta or cottage cheese, or plain low-fat yogurt. Low-sodium or reduced-sodium cheeses. Fats and Oils Tub margarines without trans fats. Light or reduced-fat mayonnaise and salad dressings (reduced sodium). Avocado. Safflower, olive, or canola oils. Natural peanut or almond butter. Other Unsalted popcorn and pretzels. The items listed above may not be a complete list of recommended foods or beverages. Contact your dietitian for more options. WHAT FOODS ARE NOT RECOMMENDED? Grains White bread. White pasta. White rice. Refined cornbread. Bagels and croissants. Crackers that contain trans fat. Vegetables Creamed or fried vegetables. Vegetables in a cheese sauce. Regular canned vegetables. Regular canned tomato sauce and paste. Regular tomato and vegetable juices. Fruits Dried fruits. Canned fruit in light or heavy syrup. Fruit juice. Meat and Other Protein Products Fatty cuts of meat. Ribs, chicken wings, bacon, sausage, bologna, salami, chitterlings, fatback, hot dogs, bratwurst, and packaged luncheon meats. Salted nuts and seeds. Canned beans with salt. Dairy Whole or 2% milk, cream,  half-and-half, and cream cheese. Whole-fat or sweetened yogurt. Full-fat cheeses or blue cheese. Nondairy creamers and whipped toppings.  Processed cheese, cheese spreads, or cheese curds. Condiments Onion and garlic salt, seasoned salt, table salt, and sea salt. Canned and packaged gravies. Worcestershire sauce. Tartar sauce. Barbecue sauce. Teriyaki sauce. Soy sauce, including reduced sodium. Steak sauce. Fish sauce. Oyster sauce. Cocktail sauce. Horseradish. Ketchup and mustard. Meat flavorings and tenderizers. Bouillon cubes. Hot sauce. Tabasco sauce. Marinades. Taco seasonings. Relishes. Fats and Oils Butter, stick margarine, lard, shortening, ghee, and bacon fat. Coconut, palm kernel, or palm oils. Regular salad dressings. Other Pickles and olives. Salted popcorn and pretzels. The items listed above may not be a complete list of foods and beverages to avoid. Contact your dietitian for more information. WHERE CAN I FIND MORE INFORMATION? National Heart, Lung, and Blood Institute: travelstabloid.com Document Released: 07/19/2011 Document Revised: 08/04/2013 Document Reviewed: 06/03/2013 [facility] Patient Information 2015 [place], [place]. This information is not intended to replace advice given to you by your health care provider. Make sure you discuss any questions you have with your health care provider.

## 2014-02-16 NOTE — Progress Notes (Signed)
   Subjective:    Patient ID: Robert [name], male    DOB: 1971/06/23, 43 y.o.   MRN: [id number]  HPI    Review of Systems     Objective:   Physical Exam        Assessment & Plan:  Patient came in today for a BP check

## 2014-02-16 NOTE — Assessment & Plan Note (Signed)
Poorly controlled will alter medications, encouraged DASH diet, minimize caffeine and obtain adequate sleep. Report concerning symptoms and follow up as directed and as needed. Will increase Carvedilol dose to tid at same strength and reassess at next visit

## 2014-02-16 NOTE — Assessment & Plan Note (Signed)
Encouraged DASH diet, decrease po intake and increase exercise as tolerated. Needs 7-8 hours of sleep nightly. Avoid trans fats, eat small, frequent meals every 4-5 hours with lean proteins, complex carbs and healthy fats. Minimize simple carbs, GMO foods. 

## 2014-03-22 ENCOUNTER — Ambulatory Visit (HOSPITAL_BASED_OUTPATIENT_CLINIC_OR_DEPARTMENT_OTHER): Payer: 59 | Attending: Family Medicine | Admitting: Radiology

## 2014-03-22 VITALS — Ht 75.0 in | Wt 270.0 lb

## 2014-03-22 DIAGNOSIS — R5383 Other fatigue: Secondary | ICD-10-CM

## 2014-03-22 DIAGNOSIS — G479 Sleep disorder, unspecified: Secondary | ICD-10-CM

## 2014-03-22 DIAGNOSIS — G4733 Obstructive sleep apnea (adult) (pediatric): Secondary | ICD-10-CM

## 2014-03-22 DIAGNOSIS — E669 Obesity, unspecified: Secondary | ICD-10-CM

## 2014-03-22 DIAGNOSIS — R5381 Other malaise: Secondary | ICD-10-CM | POA: Diagnosis present

## 2014-03-22 DIAGNOSIS — R351 Nocturia: Secondary | ICD-10-CM

## 2014-03-22 DIAGNOSIS — I10 Essential (primary) hypertension: Secondary | ICD-10-CM | POA: Diagnosis present

## 2014-03-22 DIAGNOSIS — R0683 Snoring: Secondary | ICD-10-CM

## 2014-03-23 ENCOUNTER — Ambulatory Visit (INDEPENDENT_AMBULATORY_CARE_PROVIDER_SITE_OTHER): Payer: 59

## 2014-03-23 ENCOUNTER — Telehealth: Payer: Self-pay

## 2014-03-23 VITALS — BP 138/86 | HR 71

## 2014-03-23 DIAGNOSIS — G4733 Obstructive sleep apnea (adult) (pediatric): Secondary | ICD-10-CM

## 2014-03-23 DIAGNOSIS — I10 Essential (primary) hypertension: Secondary | ICD-10-CM

## 2014-03-23 NOTE — Sleep Study (Signed)
[place]   NAME: Robert [name]  DATE OF BIRTH: [birth year]  MEDICAL RECORD [id number]  LOCATION: [facility]   PHYSICIAN: Krisann Mckenna V.   DATE OF STUDY: 03/22/14   SLEEP STUDY TYPE: Nocturnal Polysomnogram   REFERRING PHYSICIAN: Penni Homans MD  INDICATION FOR STUDY:  43 year old man with restless sleep and excessive fatigue and difficult to control hypertension. At the time of this study ,they weighed 270 pounds with a height of 6 ft 3 inches and the BMI of 34, neck size of 17 inches. Epworth sleepiness score was 11   This nocturnal polysomnogram was performed with a sleep technologist in attendance. EEG, EOG,EMG and respiratory parameters recorded. Sleep stages, arousals, limb movements and respiratory data was scored according to criteria laid out by the American Academy of sleep medicine.   SLEEP ARCHITECTURE: Lights out was at 2226 PM and lights on was at 517 AM. Total sleep time was 371 minutes with a sleep period time of 391 minutes and a sleep efficiency of 90 %. Sleep latency was 20 minutes with latency to REM sleep of 70 minutes and wake after sleep onset of 20 minutes. . Sleep stages as a percentage of total sleep time was N1 -1.6 %,N2- 72.5 % and REM sleep 23 % ( 87 minutes) . The longest period of REM sleep was around 2 AM.   AROUSAL DATA : There were 68  arousals with an arousal index of less than events per hour. Most of these were spontaneous & 24 were associated with respiratory events  RESPIRATORY DATA: There were 6 obstructive apneas, 1 central apneas, 0 mixed apneas and 35 hypopneas with apnea -hypopnea index of 7 events per hour. There were 4 RERAs with an RDI of 7.4 events per hour. Events were only noted during REM sleep .There was no relation to body position. Supine sleep was  noted  MOVEMENT/PARASOMNIA: There were 0 PLMS with a PLM index of 0 events per hour. The PLM arousal index was 0 per hour.  OXYGEN DATA: The  lowest desaturation was 81 % during REM sleep and the desaturation index was 7 per hour.   CARDIAC DATA: The low heart rate was 35 beats per minute. The high heart rate recorded was an artifact. No arrhythmias were noted   DISCUSSION -Loud snoring was noted .He did not meet criteria for CPAP intervention. He was desensitized with nasal pillows  IMPRESSION :  1. mild obstructive sleep apnea with hypopneas, predominantly during REM sleep, causing sleep fragmentation and moderate oxygen desaturation.  2. No evidence of cardiac arrhythmias,periodic limb movements or behavioral disturbance during sleep.  3. Sleep efficiency was good  RECOMMENDATION:  1. Treatment options for this degree of sleep disordered breathing include weight loss, CPAP therapy and/ or oral appliance.  Weight loss alone might suffice. Would pursue other treatment only if he is clinically symptomatic  2. Patient should be cautioned against driving when sleepy  3. They should be asked to avoid medications with sedative side effects    [name] V.  Diplomate, Tax adviser of Sleep Medicine    ELECTRONICALLY SIGNED ON: 03/23/2014  [facility]  PH: [phone] FX: [phone]  Jane Lew

## 2014-03-23 NOTE — Progress Notes (Signed)
   Subjective:    Patient ID: Robert [name], male    DOB: [birth year], 43 y.o.   MRN: [id number]  HPI    Review of Systems     Objective:   Physical Exam        Assessment & Plan:  Patient came in today for a BP check.

## 2014-03-23 NOTE — Telephone Encounter (Signed)
Left a detailed message for patient with the information below:  Please let him know that sleep study showed mild apnea it does not meet the criteria for having to use CPAP but if he is highly symptomatic we could. If he wants to consider this he should have an appt to discuss options, if not then he needs to concentrate on weight loss, consider the DASH diet and increased exercise. DR B ----- Message ----- From: Rigoberto Noel, MD Sent: 03/23/2014 10:23 AM To: [name], MD

## 2014-06-01 ENCOUNTER — Telehealth: Payer: Self-pay | Admitting: Family Medicine

## 2014-06-01 DIAGNOSIS — I10 Essential (primary) hypertension: Secondary | ICD-10-CM

## 2014-06-01 NOTE — Telephone Encounter (Signed)
Caller name: Issak  Relation to pt: Call back number: Edna:      [facility] [place], [place].     Reason for call: Pt is requesting for refills for  carvedilol (COREG) 6.25 MG tablet. Pt states that was recommended on last visit that needed to take 3 times day a carvedilol (COREG) 6.25 MG . Pt  Mentioned [facility] has not received anything on that prescription since has already a refill  on the rx for 2 time a day. Please advise.

## 2014-06-01 NOTE — Telephone Encounter (Signed)
Please advise if this is bid or tid?

## 2014-06-02 MED ORDER — CARVEDILOL 6.25 MG PO TABS: 6.2500 mg | ORAL_TABLET | Freq: Three times a day (TID) | ORAL | Status: DC

## 2014-06-02 NOTE — Telephone Encounter (Signed)
I said he could if his numbers were running hi and he was to let me know if he did it and it was helpful and then I would send it in. So it sounds like he has been doing it and it is helpful? So I am fine for him to continue if no concerning side effects (light headed, fatigue, etc). Please send in sig 1 tab po tid disp: 90 with 2 rf

## 2014-06-02 NOTE — Telephone Encounter (Signed)
Pt informed and states he has been taking this but has not checked his BP since doing this. Pt did state he is not having any side effects since taking the Coreg TID. Pt did state he would check his BP later today.  RX sent

## 2014-07-13 ENCOUNTER — Ambulatory Visit (INDEPENDENT_AMBULATORY_CARE_PROVIDER_SITE_OTHER): Payer: 59 | Admitting: Medical

## 2014-07-13 ENCOUNTER — Encounter: Payer: Self-pay | Admitting: Medical

## 2014-07-13 VITALS — BP 136/82 | HR 83 | Temp 98.3°F | Ht 74.0 in | Wt 280.2 lb

## 2014-07-13 DIAGNOSIS — R059 Cough, unspecified: Secondary | ICD-10-CM

## 2014-07-13 DIAGNOSIS — R05 Cough: Secondary | ICD-10-CM

## 2014-07-13 DIAGNOSIS — J209 Acute bronchitis, unspecified: Secondary | ICD-10-CM

## 2014-07-13 MED ORDER — ALBUTEROL SULFATE HFA 108 (90 BASE) MCG/ACT IN AERS: 2.0000 | INHALATION_SPRAY | Freq: Four times a day (QID) | RESPIRATORY_TRACT | Status: DC | PRN

## 2014-07-13 MED ORDER — HYDROCOD POLST-CHLORPHEN POLST 10-8 MG/5ML PO LQCR: 5.0000 mL | Freq: Two times a day (BID) | ORAL | Status: DC | PRN

## 2014-07-13 MED ORDER — AMOXICILLIN-POT CLAVULANATE 875-125 MG PO TABS: 1.0000 | ORAL_TABLET | Freq: Two times a day (BID) | ORAL | Status: DC

## 2014-07-13 NOTE — Assessment & Plan Note (Signed)
You appear to have bronchitis. Rest hydrate and tylenol for fever. I am prescribing cough medicine tussionex, and and antibiotic augmentin. For your nasal congestion you could use otc the counter nasal steroid flonase  I do recommend a chest xray today since your daughter has pneumonia.  Also will make albuterol inhaler available to use if your start to wheeze. Also sometimes tight airways can cause cough that is constant.

## 2014-07-13 NOTE — Progress Notes (Signed)
Subjective:    Patient ID: Robert [name], male    DOB: 08-31-1970, 43 y.o.   MRN: [id number]  HPI  Pt in today reporting  cough, nasal congestion and runny nose for 8 days. Early on first 2 days body aches. Then developed the above. Pt is coughing day and night. Coughing keeping him up at night. Pt feels congested in his chest. Pt gets bronchitis about one time a year. Currently daughter has pneumonia  Associated symptoms( below yes or no)  Fever-On Friday felt feverish. Chills-no Chest congestion-yes Sneezing- no Itching eyes-no Sore throat- from coughing Post-nasal drainage- Wheezing-some wheezing intermittent. Pt not smoker, no hx of asthma. Purulent drainage-no Fatigue-yes  Past Medical History  Diagnosis Date  . Hypertension   . History of migraine headaches   . Dyslipidemia 11/09/2012  . Hyperglycemia 11/09/2012  . Arthralgia 01/11/2013    Diffuse, b/l Hips R>L Knees, ankles, low back    History   Social History  . Marital Status: Married    Spouse Name: N/A    Number of Children: 4  . 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 Smoking status: Former Smoker -- 9 years    Types: Cigarettes    Quit date: 08/14/1995  . Smokeless tobacco: Never Used  . Alcohol Use: 3.6 - 6.0 oz/week    6-10 Cans of beer per week  . Drug Use: No  . Sexual Activity: Yes     Comment: lives with wife and daughter, no dietary restrictions   Other Topics Concern  . 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 No past surgeries      Family History  Problem Relation Age of Onset  . Arthritis Mother   . Hypertension Mother   . Diabetes  Mother   . Arthritis Father   . Hypertension Father   . Diabetes Father   . Sleep apnea Father   . Obesity Father   . Arthritis Maternal Grandmother   . Diabetes Maternal Grandmother   . Stroke Maternal Grandmother   . Arthritis Paternal Grandmother   . Diabetes Paternal Grandmother   . Cancer Paternal Grandmother     lung, smoker  . Diabetes Paternal Grandfather   . Heart disease Paternal Grandfather 37    MI  . Kidney disease Neg Hx   . Hyperlipidemia Brother   . Hypertension Brother   . Diabetes Brother   . COPD Maternal Grandfather     Allergies  Allergen Reactions  . Mucinex [Guaifenesin Er]   . Nsaids     Aleve, Advil cause swelling, itching and rash  . Latex Rash    Current Outpatient Prescriptions on File Prior to Visit  Medication Sig Dispense Refill  . amLODipine (NORVASC) 5 MG tablet Take 1 tablet (5 mg total) by mouth daily. 30 tablet 5  . carvedilol (COREG) 6.25 MG tablet Take 1 tablet (6.25 mg total) by mouth 3 (three) times daily with meals. 90 tablet 2  . lisinopril-hydrochlorothiazide (PRINZIDE,ZESTORETIC) 20-12.5 MG per tablet Take  1 tablet by mouth 2 (two) times daily. 60 tablet 5  . omeprazole (PRILOSEC) 20 MG capsule Take 1 capsule (20 mg total) by mouth daily. 30 capsule 11   No current facility-administered medications on file prior to visit.    BP 136/82 mmHg  Pulse 83  Temp(Src) 98.3 F (36.8 C) (Oral)  Ht 6\' 2"  (1.88 m)  Wt 280 lb 3.2 oz (127.098 kg)  BMI 35.96 kg/m2  SpO2 96%       Review of Systems  Constitutional: Negative for fever, chills and fatigue.  HENT: Positive for congestion, postnasal drip, sinus pressure and sore throat.   Respiratory: Positive for cough. Negative for wheezing.   Cardiovascular: Negative for chest pain and palpitations.  Musculoskeletal: Negative for neck pain.  Neurological: Negative for dizziness and headaches.  Hematological: Negative for adenopathy. Does not bruise/bleed easily.            Objective:   Physical Exam  General  Mental Status - Alert. General Appearance - Well groomed. Not in acute distress.  Skin Rashes- No Rashes.  HEENT Head- Normal. Ear Auditory Canal - Left- Normal. Right - Normal.Tympanic Membrane- Left- Normal. Right- Normal. Eye Sclera/Conjunctiva- Left- Normal. Right- Normal. Nose & Sinuses Nasal Mucosa- Left-  Boggy and Congested. Right-  Boggy and  Congested.Bilateral maxillary and frontal sinus pressure. Mouth & Throat Lips: Upper Lip- Normal: no dryness, cracking, pallor, cyanosis, or vesicular eruption. Lower Lip-Normal: no dryness, cracking, pallor, cyanosis or vesicular eruption. Buccal Mucosa- Bilateral- No Aphthous ulcers. Oropharynx- No Discharge or Erythema. Tonsils: Characteristics- Bilateral- No Erythema or Congestion. Size/Enlargement- Bilateral- No enlargement. Discharge- bilateral-None.  Neck Neck- Supple. No Masses.   Chest and Lung Exam Auscultation: Breath Sounds:- even unlabored but scattered lobe rhonchi bilaterally.  Cardiovascular Auscultation:Rythm- Regular, rate and rhythm. Murmurs & Other Heart Sounds:Ausculatation of the heart reveal- No Murmurs.  Lymphatic Head & Neck General Head & Neck Lymphatics: Bilateral: Description- No Localized lymphadenopathy.        Assessment & Plan:

## 2014-07-13 NOTE — Patient Instructions (Addendum)
You appear to have bronchitis. Rest hydrate and tylenol for fever. I am prescribing cough medicine tussionex, and and antibiotic augmentin. For your nasal congestion you could use otc the counter nasal steroid flonase  I do recommend a chest xray today since your daughter has pneumonia.  Also will make albuterol inhaler available to use if your start to wheeze. Also sometimes tight airways can cause cough that is constant.   Follow up in 7-10 days or as needed

## 2014-07-13 NOTE — Progress Notes (Signed)
Pre visit review using our clinic review tool, if applicable. No additional management support is needed unless otherwise documented below in the visit note. 

## 2014-07-15 ENCOUNTER — Telehealth: Payer: Self-pay | Admitting: Medical

## 2014-07-15 MED ORDER — ALBUTEROL SULFATE HFA 108 (90 BASE) MCG/ACT IN AERS: INHALATION_SPRAY | RESPIRATORY_TRACT | Status: DC

## 2014-07-15 NOTE — Telephone Encounter (Signed)
Rx proair better coverage.

## 2014-07-20 ENCOUNTER — Ambulatory Visit (INDEPENDENT_AMBULATORY_CARE_PROVIDER_SITE_OTHER): Payer: 59 | Admitting: Family Medicine

## 2014-07-20 ENCOUNTER — Encounter: Payer: Self-pay | Admitting: Family Medicine

## 2014-07-20 VITALS — BP 149/6 | HR 66 | Temp 98.7°F | Ht 74.0 in | Wt 279.0 lb

## 2014-07-20 DIAGNOSIS — E669 Obesity, unspecified: Secondary | ICD-10-CM

## 2014-07-20 DIAGNOSIS — E1169 Type 2 diabetes mellitus with other specified complication: Secondary | ICD-10-CM

## 2014-07-20 DIAGNOSIS — E785 Hyperlipidemia, unspecified: Secondary | ICD-10-CM

## 2014-07-20 DIAGNOSIS — E119 Type 2 diabetes mellitus without complications: Secondary | ICD-10-CM

## 2014-07-20 DIAGNOSIS — R739 Hyperglycemia, unspecified: Secondary | ICD-10-CM

## 2014-07-20 DIAGNOSIS — I10 Essential (primary) hypertension: Secondary | ICD-10-CM

## 2014-07-20 LAB — LIPID PANEL
Cholesterol: 149 mg/dL (ref 0–200)
HDL: 27.3 mg/dL — ABNORMAL LOW (ref >39.00)
LDL Cholesterol: 94 mg/dL (ref 0–99)
NonHDL: 121.7
Total CHOL/HDL Ratio: 5
Triglycerides: 139 mg/dL (ref 0.0–149.0)
VLDL: 27.8 mg/dL (ref 0.0–40.0)

## 2014-07-20 LAB — HEMOGLOBIN A1C: Hgb A1c MFr Bld: 6.8 % — ABNORMAL HIGH (ref 4.6–6.5)

## 2014-07-20 LAB — CBC
HCT: 43.6 % (ref 39.0–52.0)
Hemoglobin: 15.4 g/dL (ref 13.0–17.0)
MCHC: 35.4 g/dL (ref 30.0–36.0)
MCV: 91.8 fl (ref 78.0–100.0)
Platelets: 248 10*3/uL (ref 150.0–400.0)
RBC: 4.75 Mil/uL (ref 4.22–5.81)
RDW: 13.2 % (ref 11.5–15.5)
WBC: 5.6 10*3/uL (ref 4.0–10.5)

## 2014-07-20 LAB — RENAL FUNCTION PANEL
Albumin: 4.2 g/dL (ref 3.5–5.2)
BUN: 12 mg/dL (ref 6–23)
CO2: 27 mEq/L (ref 19–32)
Calcium: 9.2 mg/dL (ref 8.4–10.5)
Chloride: 102 mEq/L (ref 96–112)
Creatinine, Ser: 1.2 mg/dL (ref 0.4–1.5)
GFR: 72.23 mL/min (ref >60.00)
Glucose, Bld: 130 mg/dL — ABNORMAL HIGH (ref 70–99)
Phosphorus: 3 mg/dL (ref 2.3–4.6)
Potassium: 3.5 mEq/L (ref 3.5–5.1)
Sodium: 138 mEq/L (ref 135–145)

## 2014-07-20 LAB — HEPATIC FUNCTION PANEL
ALT: 35 U/L (ref 0–53)
AST: 29 U/L (ref 0–37)
Albumin: 4.2 g/dL (ref 3.5–5.2)
Alkaline Phosphatase: 51 U/L (ref 39–117)
Bilirubin, Direct: 0.1 mg/dL (ref 0.0–0.3)
Total Bilirubin: 0.8 mg/dL (ref 0.2–1.2)
Total Protein: 7.1 g/dL (ref 6.0–8.3)

## 2014-07-20 LAB — TSH: TSH: 0.86 u[IU]/mL (ref 0.35–4.50)

## 2014-07-20 MED ORDER — CARVEDILOL 12.5 MG PO TABS: 12.5000 mg | ORAL_TABLET | Freq: Two times a day (BID) | ORAL | Status: DC

## 2014-07-20 NOTE — Progress Notes (Signed)
Pre visit review using our clinic review tool, if applicable. No additional management support is needed unless otherwise documented below in the visit note. 

## 2014-07-20 NOTE — Patient Instructions (Addendum)
64 oz of fluid daily or more if ill   Start a probiotic daily such as Digestive Advantage or [facility]   Basic Carbohydrate Counting for Diabetes Mellitus Carbohydrate counting is a method for keeping track of the amount of carbohydrates you eat. Eating carbohydrates naturally increases the level of sugar (glucose) in your blood, so it is important for you to know the amount that is okay for you to have in every meal. Carbohydrate counting helps keep the level of glucose in your blood within normal limits. The amount of carbohydrates allowed is different for every person. A dietitian can help you calculate the amount that is right for you. Once you know the amount of carbohydrates you can have, you can count the carbohydrates in the foods you want to eat. Carbohydrates are found in the following foods:  Grains, such as breads and cereals.  Dried beans and soy products.  Starchy vegetables, such as potatoes, peas, and corn.  Fruit and fruit juices.  Milk and yogurt.  Sweets and snack foods, such as cake, cookies, candy, chips, soft drinks, and fruit drinks. CARBOHYDRATE COUNTING There are two ways to count the carbohydrates in your food. You can use either of the methods or a combination of both. Reading the "Nutrition Facts" on [place] The "Nutrition Facts" is an area that is included on the labels of almost all packaged food and beverages in the [place]. It includes the serving size of that food or beverage and information about the nutrients in each serving of the food, including the grams (g) of carbohydrate per serving.  Decide the number of servings of this food or beverage that you will be able to eat or drink. Multiply that number of servings by the number of grams of carbohydrate that is listed on the label for that serving. The total will be the amount of carbohydrates you will be having when you eat or drink this food or beverage. Learning Standard Serving  Sizes of Food When you eat food that is not packaged or does not include "Nutrition Facts" on the label, you need to measure the servings in order to count the amount of carbohydrates.A serving of most carbohydrate-rich foods contains about 15 g of carbohydrates. The following list includes serving sizes of carbohydrate-rich foods that provide 15 g ofcarbohydrate per serving:   1 slice of bread (1 oz) or 1 six-inch tortilla.    of a hamburger bun or English muffin.  4-6 crackers.   cup unsweetened dry cereal.    cup hot cereal.   cup rice or pasta.    cup mashed potatoes or  of a large baked potato.  1 cup fresh fruit or one small piece of fruit.    cup canned or frozen fruit or fruit juice.  1 cup milk.   cup plain fat-free yogurt or yogurt sweetened with artificial sweeteners.   cup cooked dried beans or starchy vegetable, such as peas, corn, or potatoes.  Decide the number of standard-size servings that you will eat. Multiply that number of servings by 15 (the grams of carbohydrates in that serving). For example, if you eat 2 cups of strawberries, you will have eaten 2 servings and 30 g of carbohydrates (2 servings x 15 g = 30 g). For foods such as soups and casseroles, in which more than one food is mixed in, you will need to count the carbohydrates in each food that is included. EXAMPLE OF CARBOHYDRATE COUNTING Sample Dinner  3 oz chicken breast.   cup of brown rice.   cup of corn.  1 cup milk.   1 cup strawberries with sugar-free whipped topping.  Carbohydrate Calculation Step 1: Identify the foods that contain carbohydrates:   Rice.   Corn.   Milk.   Strawberries. Step 2:Calculate the number of servings eaten of each:   2 servings of rice.   1 serving of corn.   1 serving of milk.   1 serving of strawberries. Step 3: Multiply each of those number of servings by 15 g:   2 servings of rice x 15 g = 30 g.   1 serving of  corn x 15 g = 15 g.   1 serving of milk x 15 g = 15 g.   1 serving of strawberries x 15 g = 15 g. Step 4: Add together all of the amounts to find the total grams of carbohydrates eaten: 30 g + 15 g + 15 g + 15 g = 75 g. Document Released: 07/30/2005 Document Revised: 12/14/2013 Document Reviewed: 06/26/2013 [facility] Patient Information 2015 [place], [place]. This information is not intended to replace advice given to you by your health care provider. Make sure you discuss any questions you have with your health care provider.

## 2014-07-23 ENCOUNTER — Telehealth: Payer: Self-pay | Admitting: Family Medicine

## 2014-07-23 DIAGNOSIS — I152 Hypertension secondary to endocrine disorders: Secondary | ICD-10-CM

## 2014-07-23 DIAGNOSIS — I10 Essential (primary) hypertension: Secondary | ICD-10-CM

## 2014-07-23 NOTE — Telephone Encounter (Signed)
Pt requesting a refill lisinopril-hydrochlorothiazide (PRINZIDE,ZESTORETIC) 20-12.5 MG per tablet & amLODipine (NORVASC) 5 MG

## 2014-07-26 MED ORDER — AMLODIPINE BESYLATE 5 MG PO TABS: 5.0000 mg | ORAL_TABLET | Freq: Every day | ORAL | Status: DC

## 2014-07-26 MED ORDER — LISINOPRIL-HYDROCHLOROTHIAZIDE 20-12.5 MG PO TABS: 1.0000 | ORAL_TABLET | Freq: Two times a day (BID) | ORAL | Status: DC

## 2014-07-26 NOTE — Telephone Encounter (Signed)
Meds refilled per protocol. JG//CMA

## 2014-08-01 ENCOUNTER — Encounter: Payer: Self-pay | Admitting: Family Medicine

## 2014-08-01 NOTE — Assessment & Plan Note (Signed)
Encouraged heart healthy diet, increase exercise, avoid trans fats, consider a krill oil cap daily 

## 2014-08-01 NOTE — Assessment & Plan Note (Signed)
hgba1c acceptable but increasing, minimize simple carbs. Increase exercise as tolerated. May need meds in future

## 2014-08-01 NOTE — Assessment & Plan Note (Signed)
Will try Carvedilol 12.5 mg bid and continue other meds. Poorly controlled will alter medications, encouraged DASH diet, minimize caffeine and obtain adequate sleep. Report concerning symptoms and follow up as directed and as needed

## 2014-08-01 NOTE — Assessment & Plan Note (Signed)
Encouraged DASH diet, decrease po intake and increase exercise as tolerated. Needs 7-8 hours of sleep nightly. Avoid trans fats, eat small, frequent meals every 4-5 hours with lean proteins, complex carbs and healthy fats. Minimize simple carbs, GMO foods. 

## 2014-08-01 NOTE — Progress Notes (Signed)
Robert [name]  [id number] May 24, 1971 08/01/2014      Progress Note-Follow Up  Subjective  Chief Complaint  Chief Complaint  Patient presents with  . Follow-up    6 month    HPI  Patient is a 43 y.o. male in today for routine medical care. Patient doing well. Had a recent bronchitis and came in for treatment. Is responding well to meds. Notes decreased cough and congestion. Denies CP/palp/SOB/HA/congestion/fevers/GI or GU c/o. Taking meds as prescribed  Past Medical History  Diagnosis Date  . Hypertension   . History of migraine headaches   . Dyslipidemia 11/09/2012  . Hyperglycemia 11/09/2012  . Arthralgia 01/11/2013    Diffuse, b/l Hips R>L Knees, ankles, low back  . Diabetes mellitus type 2 in obese 11/09/2012    Past Surgical History  Procedure Laterality Date  . No past surgeries      Family History  Problem Relation Age of Onset  . Arthritis Mother   . Hypertension Mother   . Diabetes Mother   . Arthritis Father   . Hypertension Father   . Diabetes Father   . Sleep apnea Father   . Obesity Father   . Arthritis Maternal Grandmother   . Diabetes Maternal Grandmother   . Stroke Maternal Grandmother   . Arthritis Paternal Grandmother   . Diabetes Paternal Grandmother   . Cancer Paternal Grandmother     lung, smoker  . Diabetes Paternal Grandfather   . Heart disease Paternal Grandfather 67    MI  . Kidney disease Neg Hx   . Hyperlipidemia Brother   . Hypertension Brother   . Diabetes Brother   . COPD Maternal Grandfather     History   Social History  . Marital Status: Married    Spouse Name: N/A    Number of Children: 4  . 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 Smoking status: Former Smoker -- 9 years    Types: Cigarettes    Quit date: 08/14/1995  . Smokeless tobacco: Never Used  . Alcohol Use: 3.6 - 6.0 oz/week    6-10 Cans of beer per week  . Drug Use: No  . Sexual  Activity: Yes     Comment: lives with wife and daughter, no dietary restrictions   Other Topics Concern  . 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 amoxicillin-clavulanate (AUGMENTIN) 875-125 MG per tablet Take 1 tablet by mouth 2 (two) times daily. 20 tablet 0  . chlorpheniramine-HYDROcodone (TUSSIONEX PENNKINETIC ER) 10-8 MG/5ML LQCR Take 5 mLs by mouth every 12 (twelve) hours as needed for cough. 115 mL 0  . omeprazole (PRILOSEC) 20 MG capsule Take 1 capsule (20 mg total) by mouth daily. 30 capsule 11   No current facility-administered medications on file prior to visit.    Allergies  Allergen Reactions  . Mucinex [Guaifenesin Er] Shortness Of Breath, Itching and Dermatitis    Swelling of hands and feet  . Nsaids  Aleve, Advil cause swelling, itching and rash  . Latex Rash    Review of Systems  Review of Systems  Constitutional: Negative for fever and malaise/fatigue.  HENT: Negative for congestion.   Eyes: Negative for discharge.  Respiratory: Negative for shortness of breath.   Cardiovascular: Negative for chest pain, palpitations and leg swelling.  Gastrointestinal: Negative for nausea, abdominal pain and diarrhea.  Genitourinary: Negative for dysuria.  Musculoskeletal: Negative for falls.  Skin: Negative for rash.  Neurological: Negative for loss of consciousness and headaches.  Endo/Heme/Allergies: Negative for polydipsia.  Psychiatric/Behavioral: Negative for depression and suicidal ideas. The patient is not nervous/anxious and does not have insomnia.     Objective  BP 149/6 mmHg  Pulse 66  Temp(Src) 98.7 F (37.1 C) (Oral)  Ht 6\' 2"  (1.88 m)   Wt 279 lb (126.554 kg)  BMI 35.81 kg/m2  SpO2 94%  Physical Exam  Physical Exam  Constitutional: He is oriented to person, place, and time and well-developed, well-nourished, and in no distress. No distress.  HENT:  Head: Normocephalic and atraumatic.  Eyes: Conjunctivae are normal.  Neck: Neck supple. No thyromegaly present.  Cardiovascular: Normal rate, regular rhythm and normal heart sounds.   No murmur heard. Pulmonary/Chest: Effort normal and breath sounds normal. No respiratory distress.  Abdominal: He exhibits no distension and no mass. There is no tenderness.  Musculoskeletal: He exhibits no edema.  Neurological: He is alert and oriented to person, place, and time.  Skin: Skin is warm.  Psychiatric: Memory, affect and judgment normal.    Lab Results  Component Value Date   TSH 0.86 07/20/2014   Lab Results  Component Value Date   WBC 5.6 07/20/2014   HGB 15.4 07/20/2014   HCT 43.6 07/20/2014   MCV 91.8 07/20/2014   PLT 248.0 07/20/2014   Lab Results  Component Value Date   CREATININE 1.2 07/20/2014   BUN 12 07/20/2014   NA 138 07/20/2014   K 3.5 07/20/2014   CL 102 07/20/2014   CO2 27 07/20/2014   Lab Results  Component Value Date   ALT 35 07/20/2014   AST 29 07/20/2014   ALKPHOS 51 07/20/2014   BILITOT 0.8 07/20/2014   Lab Results  Component Value Date   CHOL 149 07/20/2014   Lab Results  Component Value Date   HDL 27.30* 07/20/2014   Lab Results  Component Value Date   LDLCALC 94 07/20/2014   Lab Results  Component Value Date   TRIG 139.0 07/20/2014   Lab Results  Component Value Date   CHOLHDL 5 07/20/2014     Assessment & Plan  HTN (hypertension) Will try Carvedilol 12.5 mg bid and continue other meds. Poorly controlled will alter medications, encouraged DASH diet, minimize caffeine and obtain adequate sleep. Report concerning symptoms and follow up as directed and as needed  Dyslipidemia Encouraged heart healthy diet, increase  exercise, avoid trans fats, consider a krill oil cap daily  Obesity Encouraged DASH diet, decrease po intake and increase exercise as tolerated. Needs 7-8 hours of sleep nightly. Avoid trans fats, eat small, frequent meals every 4-5 hours with lean proteins, complex carbs and healthy fats. Minimize simple carbs, GMO foods.  Diabetes mellitus type 2 in obese hgba1c acceptable but increasing, minimize simple carbs. Increase exercise as tolerated. May need meds in future

## 2015-01-24 ENCOUNTER — Encounter: Payer: 59 | Admitting: Family Medicine

## 2015-06-28 ENCOUNTER — Telehealth: Payer: Self-pay | Admitting: Family Medicine

## 2015-06-28 DIAGNOSIS — I10 Essential (primary) hypertension: Secondary | ICD-10-CM

## 2015-06-28 DIAGNOSIS — I152 Hypertension secondary to endocrine disorders: Secondary | ICD-10-CM

## 2015-06-28 MED ORDER — AMLODIPINE BESYLATE 5 MG PO TABS: 5.0000 mg | ORAL_TABLET | Freq: Every day | ORAL | Status: DC

## 2015-06-28 MED ORDER — LISINOPRIL-HYDROCHLOROTHIAZIDE 20-12.5 MG PO TABS: 1.0000 | ORAL_TABLET | Freq: Two times a day (BID) | ORAL | Status: DC

## 2015-06-28 MED ORDER — CARVEDILOL 12.5 MG PO TABS: 12.5000 mg | ORAL_TABLET | Freq: Two times a day (BID) | ORAL | Status: DC

## 2015-06-28 NOTE — Telephone Encounter (Signed)
Sent in prescriptions

## 2015-06-28 NOTE — Addendum Note (Signed)
Addended by: Sharon Seller B on: 06/28/2015 04:01 PM   Modules accepted: Orders

## 2015-06-28 NOTE — Telephone Encounter (Signed)
Caller name: Ryker   Relationship to patient: Self   Can be reached: [phone]  Pharmacy: [facility] [place], [place].  Reason for call: Pt is requesting a refill on 3 medications. 1. Carvedilol 2. Amlodipine 3. Lisinopril .Marland Kitchen

## 2015-10-12 ENCOUNTER — Other Ambulatory Visit: Payer: Self-pay | Admitting: Family Medicine

## 2015-10-12 DIAGNOSIS — I10 Essential (primary) hypertension: Secondary | ICD-10-CM

## 2015-10-12 DIAGNOSIS — I152 Hypertension secondary to endocrine disorders: Secondary | ICD-10-CM

## 2015-10-12 MED ORDER — LISINOPRIL-HYDROCHLOROTHIAZIDE 20-12.5 MG PO TABS: 1.0000 | ORAL_TABLET | Freq: Two times a day (BID) | ORAL | Status: DC

## 2015-10-12 MED ORDER — AMLODIPINE BESYLATE 5 MG PO TABS: 5.0000 mg | ORAL_TABLET | Freq: Every day | ORAL | Status: DC

## 2015-10-12 MED ORDER — CARVEDILOL 12.5 MG PO TABS: 12.5000 mg | ORAL_TABLET | Freq: Two times a day (BID) | ORAL | Status: DC

## 2015-10-12 NOTE — Telephone Encounter (Signed)
Pt has not been seen since 2015, has appt scheduled in 11/2015. Rx's sent, 30 day supply and 1 refill.

## 2015-10-12 NOTE — Telephone Encounter (Signed)
Pharmacy: [facility] [place], [place].  Reason for call: pt needing refill on amlodipine (out), carvedilol (couple days), and lisinopril (couple days).

## 2015-12-07 ENCOUNTER — Telehealth: Payer: Self-pay | Admitting: *Deleted

## 2015-12-07 NOTE — Telephone Encounter (Signed)
Unable to reach patient at time of pre-visit call. Left message for patient to return call when available.  

## 2015-12-08 ENCOUNTER — Ambulatory Visit (INDEPENDENT_AMBULATORY_CARE_PROVIDER_SITE_OTHER): Payer: 59 | Admitting: Family Medicine

## 2015-12-08 ENCOUNTER — Encounter: Payer: Self-pay | Admitting: Family Medicine

## 2015-12-08 VITALS — BP 142/94 | HR 72 | Temp 98.8°F | Ht 75.0 in | Wt 287.0 lb

## 2015-12-08 DIAGNOSIS — K219 Gastro-esophageal reflux disease without esophagitis: Secondary | ICD-10-CM

## 2015-12-08 DIAGNOSIS — I10 Essential (primary) hypertension: Secondary | ICD-10-CM

## 2015-12-08 DIAGNOSIS — Z0001 Encounter for general adult medical examination with abnormal findings: Secondary | ICD-10-CM

## 2015-12-08 DIAGNOSIS — I152 Hypertension secondary to endocrine disorders: Secondary | ICD-10-CM | POA: Diagnosis not present

## 2015-12-08 DIAGNOSIS — E119 Type 2 diabetes mellitus without complications: Secondary | ICD-10-CM | POA: Diagnosis not present

## 2015-12-08 DIAGNOSIS — E1169 Type 2 diabetes mellitus with other specified complication: Secondary | ICD-10-CM

## 2015-12-08 DIAGNOSIS — Z0000 Encounter for general adult medical examination without abnormal findings: Secondary | ICD-10-CM

## 2015-12-08 DIAGNOSIS — M5441 Lumbago with sciatica, right side: Secondary | ICD-10-CM

## 2015-12-08 DIAGNOSIS — E669 Obesity, unspecified: Secondary | ICD-10-CM | POA: Diagnosis not present

## 2015-12-08 DIAGNOSIS — M549 Dorsalgia, unspecified: Secondary | ICD-10-CM | POA: Insufficient documentation

## 2015-12-08 DIAGNOSIS — E785 Hyperlipidemia, unspecified: Secondary | ICD-10-CM | POA: Diagnosis not present

## 2015-12-08 HISTORY — DX: Gastro-esophageal reflux disease without esophagitis: K21.9

## 2015-12-08 HISTORY — DX: Dorsalgia, unspecified: M54.9

## 2015-12-08 LAB — CBC
HCT: 46.4 % (ref 39.0–52.0)
Hemoglobin: 15.6 g/dL (ref 13.0–17.0)
MCHC: 33.6 g/dL (ref 30.0–36.0)
MCV: 89 fl (ref 78.0–100.0)
Platelets: 223 10*3/uL (ref 150.0–400.0)
RBC: 5.21 Mil/uL (ref 4.22–5.81)
RDW: 13.7 % (ref 11.5–15.5)
WBC: 7.3 10*3/uL (ref 4.0–10.5)

## 2015-12-08 LAB — COMPREHENSIVE METABOLIC PANEL
ALT: 31 U/L (ref 0–53)
AST: 24 U/L (ref 0–37)
Albumin: 4.2 g/dL (ref 3.5–5.2)
Alkaline Phosphatase: 45 U/L (ref 39–117)
BUN: 14 mg/dL (ref 6–23)
CO2: 30 mEq/L (ref 19–32)
Calcium: 9.4 mg/dL (ref 8.4–10.5)
Chloride: 104 mEq/L (ref 96–112)
Creatinine, Ser: 1.58 mg/dL — ABNORMAL HIGH (ref 0.40–1.50)
GFR: 50.26 mL/min — ABNORMAL LOW (ref >60.00)
Glucose, Bld: 109 mg/dL — ABNORMAL HIGH (ref 70–99)
Potassium: 3.8 mEq/L (ref 3.5–5.1)
Sodium: 140 mEq/L (ref 135–145)
Total Bilirubin: 0.6 mg/dL (ref 0.2–1.2)
Total Protein: 6.9 g/dL (ref 6.0–8.3)

## 2015-12-08 LAB — LIPID PANEL
Cholesterol: 156 mg/dL (ref 0–200)
HDL: 33.9 mg/dL — ABNORMAL LOW (ref >39.00)
LDL Cholesterol: 98 mg/dL (ref 0–99)
NonHDL: 121.72
Total CHOL/HDL Ratio: 5
Triglycerides: 120 mg/dL (ref 0.0–149.0)
VLDL: 24 mg/dL (ref 0.0–40.0)

## 2015-12-08 LAB — HEMOGLOBIN A1C: Hgb A1c MFr Bld: 6.9 % — ABNORMAL HIGH (ref 4.6–6.5)

## 2015-12-08 LAB — TSH: TSH: 0.54 u[IU]/mL (ref 0.35–4.50)

## 2015-12-08 MED ORDER — CARVEDILOL 12.5 MG PO TABS: 12.5000 mg | ORAL_TABLET | Freq: Two times a day (BID) | ORAL | Status: DC

## 2015-12-08 MED ORDER — CYCLOBENZAPRINE HCL 10 MG PO TABS: 10.0000 mg | ORAL_TABLET | Freq: Two times a day (BID) | ORAL | Status: DC | PRN

## 2015-12-08 MED ORDER — LOSARTAN POTASSIUM-HCTZ 100-25 MG PO TABS: 1.0000 | ORAL_TABLET | Freq: Every day | ORAL | Status: DC

## 2015-12-08 MED ORDER — HYDROCODONE-ACETAMINOPHEN 5-325 MG PO TABS: 1.0000 | ORAL_TABLET | Freq: Two times a day (BID) | ORAL | Status: DC | PRN

## 2015-12-08 MED ORDER — AMLODIPINE BESYLATE 5 MG PO TABS: 5.0000 mg | ORAL_TABLET | Freq: Every day | ORAL | Status: DC

## 2015-12-08 NOTE — Assessment & Plan Note (Signed)
Encouraged moist heat and gentle stretching as tolerated. May try NSAIDs and prescription meds as directed and report if symptoms worsen or seek immediate care. Norco and Flexeril prn has helped in past is allowed a small amount to help manage the acute pain also add Lidocaine patches

## 2015-12-08 NOTE — Patient Instructions (Addendum)
Salonpas with lidocaine and aspercreme. NOW Probiotic Vitamin. Available at [employer]. Soak toe in distilled vinger and use vicks vapor rub and apply lamisol cream on toe. Preventive Care for Adults, Male A healthy lifestyle and preventive care can promote health and wellness. Preventive health guidelines for men include the following key practices:  A routine yearly physical is a good way to check with your health care provider about your health and preventative screening. It is a chance to share any concerns and updates on your health and to receive a thorough exam.  Visit your dentist for a routine exam and preventative care every 6 months. Brush your teeth twice a day and floss once a day. Good oral hygiene prevents tooth decay and gum disease.  The frequency of eye exams is based on your age, health, family medical history, use of contact lenses, and other factors. Follow your health care provider's recommendations for frequency of eye exams.  Eat a healthy diet. Foods such as vegetables, fruits, whole grains, low-fat dairy products, and lean protein foods contain the nutrients you need without too many calories. Decrease your intake of foods high in solid fats, added sugars, and salt. Eat the right amount of calories for you.Get information about a proper diet from your health care provider, if necessary.  Regular physical exercise is one of the most important things you can do for your health. Most adults should get at least 150 minutes of moderate-intensity exercise (any activity that increases your heart rate and causes you to sweat) each week. In addition, most adults need muscle-strengthening exercises on 2 or more days a week.  Maintain a healthy weight. The body mass index (BMI) is a screening tool to identify possible weight problems. It provides an estimate of body fat based on height and weight. Your health care provider can find your BMI and can help you achieve or maintain a  healthy weight.For adults 20 years and older:  A BMI below 18.5 is considered underweight.  A BMI of 18.5 to 24.9 is normal.  A BMI of 25 to 29.9 is considered overweight.  A BMI of 30 and above is considered obese.  Maintain normal blood lipids and cholesterol levels by exercising and minimizing your intake of saturated fat. Eat a balanced diet with plenty of fruit and vegetables. Blood tests for lipids and cholesterol should begin at age 17 and be repeated every 5 years. If your lipid or cholesterol levels are high, you are over 50, or you are at high risk for heart disease, you may need your cholesterol levels checked more frequently.Ongoing high lipid and cholesterol levels should be treated with medicines if diet and exercise are not working.  If you smoke, find out from your health care provider how to quit. If you do not use tobacco, do not start.  Lung cancer screening is recommended for adults aged 59-80 years who are at high risk for developing lung cancer because of a history of smoking. A yearly low-dose CT scan of the lungs is recommended for people who have at least a 30-pack-year history of smoking and are a current smoker or have quit within the past 15 years. A pack year of smoking is smoking an average of 1 pack of cigarettes a day for 1 year (for example: 1 pack a day for 30 years or 2 packs a day for 15 years). Yearly screening should continue until the smoker has stopped smoking for at least 15 years. Yearly screening should be  stopped for people who develop a health problem that would prevent them from having lung cancer treatment.  If you choose to drink alcohol, do not have more than 2 drinks per day. One drink is considered to be 12 ounces (355 mL) of beer, 5 ounces (148 mL) of wine, or 1.5 ounces (44 mL) of liquor.  Avoid use of street drugs. Do not share needles with anyone. Ask for help if you need support or instructions about stopping the use of drugs.  High blood  pressure causes heart disease and increases the risk of stroke. Your blood pressure should be checked at least every 1-2 years. Ongoing high blood pressure should be treated with medicines, if weight loss and exercise are not effective.  If you are 74-65 years old, ask your health care provider if you should take aspirin to prevent heart disease.  Diabetes screening is done by taking a blood sample to check your blood glucose level after you have not eaten for a certain period of time (fasting). If you are not overweight and you do not have risk factors for diabetes, you should be screened once every 3 years starting at age 31. If you are overweight or obese and you are 68-110 years of age, you should be screened for diabetes every year as part of your cardiovascular risk assessment.  Colorectal cancer can be detected and often prevented. Most routine colorectal cancer screening begins at the age of 49 and continues through age 76. However, your health care provider may recommend screening at an earlier age if you have risk factors for colon cancer. On a yearly basis, your health care provider may provide home test kits to check for hidden blood in the stool. Use of a small camera at the end of a tube to directly examine the colon (sigmoidoscopy or colonoscopy) can detect the earliest forms of colorectal cancer. Talk to your health care provider about this at age 18, when routine screening begins. Direct exam of the colon should be repeated every 5-10 years through age 22, unless early forms of precancerous polyps or small growths are found.  People who are at an increased risk for hepatitis B should be screened for this virus. You are considered at high risk for hepatitis B if:  You were born in a country where hepatitis B occurs often. Talk with your health care provider about which countries are considered high risk.  Your parents were born in a high-risk country and you have not received a shot to  protect against hepatitis B (hepatitis B vaccine).  You have HIV or AIDS.  You use needles to inject street drugs.  You live with, or have sex with, someone who has hepatitis B.  You are a man who has sex with other men (MSM).  You get hemodialysis treatment.  You take certain medicines for conditions such as cancer, organ transplantation, and autoimmune conditions.  Hepatitis C blood testing is recommended for all people born from [birth year] through 1965 and any individual with known risks for hepatitis C.  Practice safe sex. Use condoms and avoid high-risk sexual practices to reduce the spread of sexually transmitted infections (STIs). STIs include gonorrhea, chlamydia, syphilis, trichomonas, herpes, HPV, and human immunodeficiency virus (HIV). Herpes, HIV, and HPV are viral illnesses that have no cure. They can result in disability, cancer, and death.  If you are a man who has sex with other men, you should be screened at least once per year for:  HIV.  Urethral, rectal, and pharyngeal infection of gonorrhea, chlamydia, or both.  If you are at risk of being infected with HIV, it is recommended that you take a prescription medicine daily to prevent HIV infection. This is called preexposure prophylaxis (PrEP). You are considered at risk if:  You are a man who has sex with other men (MSM) and have other risk factors.  You are a heterosexual man, are sexually active, and are at increased risk for HIV infection.  You take drugs by injection.  You are sexually active with a partner who has HIV.  Talk with your health care provider about whether you are at high risk of being infected with HIV. If you choose to begin PrEP, you should first be tested for HIV. You should then be tested every 3 months for as long as you are taking PrEP.  A one-time screening for abdominal aortic aneurysm (AAA) and surgical repair of large AAAs by ultrasound are recommended for men ages 30 to 67 years who are  current or former smokers.  Healthy men should no longer receive prostate-specific antigen (PSA) blood tests as part of routine cancer screening. Talk with your health care provider about prostate cancer screening.  Testicular cancer screening is not recommended for adult males who have no symptoms. Screening includes self-exam, a health care provider exam, and other screening tests. Consult with your health care provider about any symptoms you have or any concerns you have about testicular cancer.  Use sunscreen. Apply sunscreen liberally and repeatedly throughout the day. You should seek shade when your shadow is shorter than you. Protect yourself by wearing long sleeves, pants, a wide-brimmed hat, and sunglasses year round, whenever you are outdoors.  Once a month, do a whole-body skin exam, using a mirror to look at the skin on your back. Tell your health care provider about new moles, moles that have irregular borders, moles that are larger than a pencil eraser, or moles that have changed in shape or color.  Stay current with required vaccines (immunizations).  Influenza vaccine. All adults should be immunized every year.  Tetanus, diphtheria, and acellular pertussis (Td, Tdap) vaccine. An adult who has not previously received Tdap or who does not know his vaccine status should receive 1 dose of Tdap. This initial dose should be followed by tetanus and diphtheria toxoids (Td) booster doses every 10 years. Adults with an unknown or incomplete history of completing a 3-dose immunization series with Td-containing vaccines should begin or complete a primary immunization series including a Tdap dose. Adults should receive a Td booster every 10 years.  Varicella vaccine. An adult without evidence of immunity to varicella should receive 2 doses or a second dose if he has previously received 1 dose.  Human papillomavirus (HPV) vaccine. Males aged 11-21 years who have not received the vaccine  previously should receive the 3-dose series. Males aged 22-26 years may be immunized. Immunization is recommended through the age of 37 years for any male who has sex with males and did not get any or all doses earlier. Immunization is recommended for any person with an immunocompromised condition through the age of 61 years if he did not get any or all doses earlier. During the 3-dose series, the second dose should be obtained 4-8 weeks after the first dose. The third dose should be obtained 24 weeks after the first dose and 16 weeks after the second dose.  Zoster vaccine. One dose is recommended for adults aged 64 years or older  unless certain conditions are present.  Measles, mumps, and rubella (MMR) vaccine. Adults born before 60 generally are considered immune to measles and mumps. Adults born in [birth year] or later should have 1 or more doses of MMR vaccine unless there is a contraindication to the vaccine or there is laboratory evidence of immunity to each of the three diseases. A routine second dose of MMR vaccine should be obtained at least 28 days after the first dose for students attending postsecondary schools, health care workers, or international travelers. People who received inactivated measles vaccine or an unknown type of measles vaccine during 1963-1967 should receive 2 doses of MMR vaccine. People who received inactivated mumps vaccine or an unknown type of mumps vaccine before 1979 and are at high risk for mumps infection should consider immunization with 2 doses of MMR vaccine. Unvaccinated health care workers born before [birth year] who lack laboratory evidence of measles, mumps, or rubella immunity or laboratory confirmation of disease should consider measles and mumps immunization with 2 doses of MMR vaccine or rubella immunization with 1 dose of MMR vaccine.  Pneumococcal 13-valent conjugate (PCV13) vaccine. When indicated, a person who is uncertain of his immunization history and has no record  of immunization should receive the PCV13 vaccine. All adults 22 years of age and older should receive this vaccine. An adult aged 100 years or older who has certain medical conditions and has not been previously immunized should receive 1 dose of PCV13 vaccine. This PCV13 should be followed with a dose of pneumococcal polysaccharide (PPSV23) vaccine. Adults who are at high risk for pneumococcal disease should obtain the PPSV23 vaccine at least 8 weeks after the dose of PCV13 vaccine. Adults older than 47 years of age who have normal immune system function should obtain the PPSV23 vaccine dose at least 1 year after the dose of PCV13 vaccine.  Pneumococcal polysaccharide (PPSV23) vaccine. When PCV13 is also indicated, PCV13 should be obtained first. All adults aged 54 years and older should be immunized. An adult younger than age 37 years who has certain medical conditions should be immunized. Any person who resides in a nursing home or long-term care facility should be immunized. An adult smoker should be immunized. People with an immunocompromised condition and certain other conditions should receive both PCV13 and PPSV23 vaccines. People with human immunodeficiency virus (HIV) infection should be immunized as soon as possible after diagnosis. Immunization during chemotherapy or radiation therapy should be avoided. Routine use of PPSV23 vaccine is not recommended for American Indians, [place] Natives, or people younger than 65 years unless there are medical conditions that require PPSV23 vaccine. When indicated, people who have unknown immunization and have no record of immunization should receive PPSV23 vaccine. One-time revaccination 5 years after the first dose of PPSV23 is recommended for people aged 19-64 years who have chronic kidney failure, nephrotic syndrome, asplenia, or immunocompromised conditions. People who received 1-2 doses of PPSV23 before age 59 years should receive another dose of PPSV23 vaccine  at age 67 years or later if at least 5 years have passed since the previous dose. Doses of PPSV23 are not needed for people immunized with PPSV23 at or after age 48 years.  Meningococcal vaccine. Adults with asplenia or persistent complement component deficiencies should receive 2 doses of quadrivalent meningococcal conjugate (MenACWY-D) vaccine. The doses should be obtained at least 2 months apart. Microbiologists working with certain meningococcal bacteria, [employer] recruits, people at risk during an outbreak, and people who travel to or live in countries  with a high rate of meningitis should be immunized. A first-year college student up through age 59 years who is living in a residence hall should receive a dose if he did not receive a dose on or after his 16th birthday. Adults who have certain high-risk conditions should receive one or more doses of vaccine.  Hepatitis A vaccine. Adults who wish to be protected from this disease, have chronic liver disease, work with hepatitis A-infected animals, work in hepatitis A research labs, or travel to or work in countries with a high rate of hepatitis A should be immunized. Adults who were previously unvaccinated and who anticipate close contact with an international adoptee during the first 60 days after arrival in the [place] from a country with a high rate of hepatitis A should be immunized.  Hepatitis B vaccine. Adults should be immunized if they wish to be protected from this disease, are under age 19 years and have diabetes, have chronic liver disease, have had more than one sex partner in the past 6 months, may be exposed to blood or other infectious body fluids, are household contacts or sex partners of hepatitis B positive people, are clients or workers in certain care facilities, or travel to or work in countries with a high rate of hepatitis B.  Haemophilus influenzae type b (Hib) vaccine. A previously unvaccinated person with asplenia or sickle  cell disease or having a scheduled splenectomy should receive 1 dose of Hib vaccine. Regardless of previous immunization, a recipient of a hematopoietic stem cell transplant should receive a 3-dose series 6-12 months after his successful transplant. Hib vaccine is not recommended for adults with HIV infection. Preventive Service / Frequency Ages 32 to 72  Blood pressure check.** / Every 3-5 years.  Lipid and cholesterol check.** / Every 5 years beginning at age 9.  Hepatitis C blood test.** / For any individual with known risks for hepatitis C.  Skin self-exam. / Monthly.  Influenza vaccine. / Every year.  Tetanus, diphtheria, and acellular pertussis (Tdap, Td) vaccine.** / Consult your health care provider. 1 dose of Td every 10 years.  Varicella vaccine.** / Consult your health care provider.  HPV vaccine. / 3 doses over 6 months, if 66 or younger.  Measles, mumps, rubella (MMR) vaccine.** / You need at least 1 dose of MMR if you were born in 1957 or later. You may also need a second dose.  Pneumococcal 13-valent conjugate (PCV13) vaccine.** / Consult your health care provider.  Pneumococcal polysaccharide (PPSV23) vaccine.** / 1 to 2 doses if you smoke cigarettes or if you have certain conditions.  Meningococcal vaccine.** / 1 dose if you are age 72 to 52 years and a Market researcher living in a residence hall, or have one of several medical conditions. You may also need additional booster doses.  Hepatitis A vaccine.** / Consult your health care provider.  Hepatitis B vaccine.** / Consult your health care provider.  Haemophilus influenzae type b (Hib) vaccine.** / Consult your health care provider. Ages 92 to 38  Blood pressure check.** / Every year.  Lipid and cholesterol check.** / Every 5 years beginning at age 25.  Lung cancer screening. / Every year if you are aged 24-80 years and have a 30-pack-year history of smoking and currently smoke or have quit within  the past 15 years. Yearly screening is stopped once you have quit smoking for at least 15 years or develop a health problem that would prevent you from having lung  cancer treatment.  Fecal occult blood test (FOBT) of stool. / Every year beginning at age 61 and continuing until age 3. You may not have to do this test if you get a colonoscopy every 10 years.  Flexible sigmoidoscopy** or colonoscopy.** / Every 5 years for a flexible sigmoidoscopy or every 10 years for a colonoscopy beginning at age 65 and continuing until age 10.  Hepatitis C blood test.** / For all people born from [birth year] through 1965 and any individual with known risks for hepatitis C.  Skin self-exam. / Monthly.  Influenza vaccine. / Every year.  Tetanus, diphtheria, and acellular pertussis (Tdap/Td) vaccine.** / Consult your health care provider. 1 dose of Td every 10 years.  Varicella vaccine.** / Consult your health care provider.  Zoster vaccine.** / 1 dose for adults aged 55 years or older.  Measles, mumps, rubella (MMR) vaccine.** / You need at least 1 dose of MMR if you were born in 1959 or later. You may also need a second dose.  Pneumococcal 13-valent conjugate (PCV13) vaccine.** / Consult your health care provider.  Pneumococcal polysaccharide (PPSV23) vaccine.** / 1 to 2 doses if you smoke cigarettes or if you have certain conditions.  Meningococcal vaccine.** / Consult your health care provider.  Hepatitis A vaccine.** / Consult your health care provider.  Hepatitis B vaccine.** / Consult your health care provider.  Haemophilus influenzae type b (Hib) vaccine.** / Consult your health care provider. Ages 56 and over  Blood pressure check.** / Every year.  Lipid and cholesterol check.**/ Every 5 years beginning at age 70.  Lung cancer screening. / Every year if you are aged 40-80 years and have a 30-pack-year history of smoking and currently smoke or have quit within the past 15 years. Yearly screening  is stopped once you have quit smoking for at least 15 years or develop a health problem that would prevent you from having lung cancer treatment.  Fecal occult blood test (FOBT) of stool. / Every year beginning at age 59 and continuing until age 26. You may not have to do this test if you get a colonoscopy every 10 years.  Flexible sigmoidoscopy** or colonoscopy.** / Every 5 years for a flexible sigmoidoscopy or every 10 years for a colonoscopy beginning at age 57 and continuing until age 45.  Hepatitis C blood test.** / For all people born from [birth year] through 1967 and any individual with known risks for hepatitis C.  Abdominal aortic aneurysm (AAA) screening.** / A one-time screening for ages 50 to 60 years who are current or former smokers.  Skin self-exam. / Monthly.  Influenza vaccine. / Every year.  Tetanus, diphtheria, and acellular pertussis (Tdap/Td) vaccine.** / 1 dose of Td every 10 years.  Varicella vaccine.** / Consult your health care provider.  Zoster vaccine.** / 1 dose for adults aged 2 years or older.  Pneumococcal 13-valent conjugate (PCV13) vaccine.** / 1 dose for all adults aged 63 years and older.  Pneumococcal polysaccharide (PPSV23) vaccine.** / 1 dose for all adults aged 45 years and older.  Meningococcal vaccine.** / Consult your health care provider.  Hepatitis A vaccine.** / Consult your health care provider.  Hepatitis B vaccine.** / Consult your health care provider.  Haemophilus influenzae type b (Hib) vaccine.** / Consult your health care provider. **Family history and personal history of risk and conditions may change your health care provider's recommendations.   This information is not intended to replace advice given to you by your health care provider. Make  sure you discuss any questions you have with your health care provider.   Document Released: 09/25/2001 Document Revised: 08/20/2014 Document Reviewed: 12/25/2010 Elsevier Interactive Patient  Education [employer].

## 2015-12-08 NOTE — Assessment & Plan Note (Signed)
Encouraged DASH diet, decrease po intake and increase exercise as tolerated. Needs 7-8 hours of sleep nightly. Avoid trans fats, eat small, frequent meals every 4-5 hours with lean proteins, complex carbs and healthy fats. Minimize simple carbs, GMO foods. 

## 2015-12-08 NOTE — Assessment & Plan Note (Addendum)
Mildly elevated and cough present will try changing from Lisinopril to Losartan hct and recheck BP in one monthEncouraged heart healthy diet such as the DASH diet and exercise as tolerated.

## 2015-12-08 NOTE — Assessment & Plan Note (Addendum)
Patient encouraged to maintain heart healthy diet, regular exercise, adequate sleep. Consider daily probiotics. Take medications as prescribed. Labs today. Given and reviewed copy of ACP documents from [place] Secretary of State and encouraged to complete and return 

## 2015-12-08 NOTE — Progress Notes (Signed)
Subjective:    Patient ID: Robert [name], male    DOB: Jan 10, 1971, 45 y.o.   MRN: [id number]  Chief Complaint  Patient presents with  . Annual Exam    HPI Patient is in today for Annual Physical Exam. Patient reports having a some concerns with degenerative disc disease, that radiates down the left legs and knees that causes them to go numb.  Patient denies any recent illness but has been unable to sleep well due to his pain, no new trauma or incontinence. He has had this trouble off and on for many years when he injured himself as a young man. Has some trboule with reflux when he misses his Omeprazole and also has some hiccups. Notes some abdominal bloating on occasion. Denies CP/palp/SOB/HA/congestion/fevers or GU c/o. Taking meds as prescribed. Does report a nagging cough, nonproductive and low grade congestion from allergies  Past Medical History  Diagnosis Date  . Hypertension   . History of migraine headaches   . Dyslipidemia 11/09/2012  . Hyperglycemia 11/09/2012  . Arthralgia 01/11/2013    Diffuse, b/l Hips R>L Knees, ankles, low back  . Diabetes mellitus type 2 in obese ([place]) 11/09/2012  . Esophageal reflux 12/08/2015  . Back pain 12/08/2015    Past Surgical History  Procedure Laterality Date  . No past surgeries      Family History  Problem Relation Age of Onset  . Arthritis Mother   . Hypertension Mother   . Diabetes Mother   . Arthritis Father   . Hypertension Father   . Diabetes Father   . Sleep apnea Father   . Obesity Father   . Arthritis Maternal Grandmother   . Diabetes Maternal Grandmother   . Stroke Maternal Grandmother   . Arthritis Paternal Grandmother   . Diabetes Paternal Grandmother   . Cancer Paternal Grandmother     lung, smoker  . Diabetes Paternal Grandfather   . Heart disease Paternal Grandfather 53    MI  . Kidney disease Neg Hx   . Hyperlipidemia Brother   . Hypertension Brother   . Diabetes Brother   . COPD Maternal Grandfather      Social History   Social History  . Marital Status: Married    Spouse Name: N/A  . Number of Children: 4  . 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 Smoking status: Former Smoker -- 9 years    Types: Cigarettes    Quit date: 08/14/1995  . Smokeless tobacco: Never Used  . Alcohol Use: 3.6 - 6.0 oz/week    6-10 Cans of beer per week  . Drug Use: No  . Sexual Activity: Yes     Comment: lives with wife and daughter, no dietary restrictions   Other Topics Concern  . 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 omeprazole (PRILOSEC) 20 MG capsule Take 1 capsule (20 mg total) by mouth daily. 30 capsule 11  . amLODipine (NORVASC) 5 MG tablet Take 1 tablet (5 mg total)  by mouth daily. 30 tablet 1  . amoxicillin-clavulanate (AUGMENTIN) 875-125 MG per tablet Take 1 tablet by mouth 2 (two) times daily. 20 tablet 0  . carvedilol (COREG) 12.5 MG tablet Take 1 tablet (12.5 mg total) by mouth 2 (two) times daily with a meal. 60 tablet 1  . chlorpheniramine-HYDROcodone (TUSSIONEX PENNKINETIC ER) 10-8 MG/5ML LQCR Take 5 mLs by mouth every 12 (twelve) hours as needed for cough. 115 mL 0  . lisinopril-hydrochlorothiazide (PRINZIDE,ZESTORETIC) 20-12.5 MG tablet Take 1 tablet by mouth 2 (two) times daily. 60 tablet 1   No facility-administered medications prior to visit.    Allergies  Allergen Reactions  . Mucinex [Guaifenesin Er] Shortness Of Breath, Itching and Dermatitis    Swelling of hands and feet  . Nsaids     Aleve, Advil cause swelling, itching and rash  . Latex Rash    Review of Systems  Constitutional: Negative  for fever and malaise/fatigue.  HENT: Negative for congestion.   Eyes: Negative for blurred vision.  Respiratory: Negative for shortness of breath.   Cardiovascular: Negative for chest pain, palpitations and leg swelling.  Gastrointestinal: Negative for nausea, abdominal pain and blood in stool.  Genitourinary: Negative for dysuria and frequency.  Musculoskeletal: Positive for back pain. Negative for falls.  Skin: Negative for rash.  Neurological: Negative for dizziness, loss of consciousness and headaches.  Endo/Heme/Allergies: Negative for environmental allergies.  Psychiatric/Behavioral: Negative for depression. The patient is not nervous/anxious.        Objective:    Physical Exam  Constitutional: He is oriented to person, place, and time. He appears well-developed and well-nourished. No distress.  HENT:  Head: Normocephalic and atraumatic.  Eyes: Conjunctivae are normal.  Neck: Neck supple. No thyromegaly present.  Cardiovascular: Normal rate, regular rhythm and normal heart sounds.   No murmur heard. Pulmonary/Chest: Effort normal and breath sounds normal. No respiratory distress. He has no wheezes.  Abdominal: Soft. Bowel sounds are normal. He exhibits no mass. There is no tenderness.  Musculoskeletal: He exhibits no edema.  Lymphadenopathy:    He has no cervical adenopathy.  Neurological: He is alert and oriented to person, place, and time.  Skin: Skin is warm and dry.  Psychiatric: He has a normal mood and affect. His behavior is normal.    BP 142/94 mmHg  Pulse 72  Temp(Src) 98.8 F (37.1 C) (Oral)  Ht 6\' 3"  (1.905 m)  Wt 287 lb (130.182 kg)  BMI 35.87 kg/m2  SpO2 95% Wt Readings from Last 3 Encounters:  12/08/15 287 lb (130.182 kg)  07/20/14 279 lb (126.554 kg)  07/13/14 280 lb 3.2 oz (127.098 kg)     Lab Results  Component Value Date   WBC 5.6 07/20/2014   HGB 15.4 07/20/2014   HCT 43.6 07/20/2014   PLT 248.0 07/20/2014   GLUCOSE 130* 07/20/2014    CHOL 149 07/20/2014   TRIG 139.0 07/20/2014   HDL 27.30* 07/20/2014   LDLCALC 94 07/20/2014   ALT 35 07/20/2014   AST 29 07/20/2014   NA 138 07/20/2014   K 3.5 07/20/2014   CL 102 07/20/2014   CREATININE 1.2 07/20/2014   BUN 12 07/20/2014   CO2 27 07/20/2014   TSH 0.86 07/20/2014   HGBA1C 6.8* 07/20/2014    Lab Results  Component Value Date   TSH 0.86 07/20/2014   Lab Results  Component Value Date   WBC 5.6 07/20/2014   HGB 15.4 07/20/2014   HCT 43.6 07/20/2014   MCV 91.8 07/20/2014  PLT 248.0 07/20/2014   Lab Results  Component Value Date   NA 138 07/20/2014   K 3.5 07/20/2014   CO2 27 07/20/2014   GLUCOSE 130* 07/20/2014   BUN 12 07/20/2014   CREATININE 1.2 07/20/2014   BILITOT 0.8 07/20/2014   ALKPHOS 51 07/20/2014   AST 29 07/20/2014   ALT 35 07/20/2014   PROT 7.1 07/20/2014   ALBUMIN 4.2 07/20/2014   ALBUMIN 4.2 07/20/2014   CALCIUM 9.2 07/20/2014   GFR 72.23 07/20/2014   Lab Results  Component Value Date   CHOL 149 07/20/2014   Lab Results  Component Value Date   HDL 27.30* 07/20/2014   Lab Results  Component Value Date   LDLCALC 94 07/20/2014   Lab Results  Component Value Date   TRIG 139.0 07/20/2014   Lab Results  Component Value Date   CHOLHDL 5 07/20/2014   Lab Results  Component Value Date   HGBA1C 6.8* 07/20/2014       Assessment & Plan:   Problem List Items Addressed This Visit    Annual physical exam    Patient encouraged to maintain heart healthy diet, regular exercise, adequate sleep. Consider daily probiotics. Take medications as prescribed. Labs today. Given and reviewed copy of ACP documents from [place] and encouraged to complete and return      Relevant Medications   carvedilol (COREG) 12.5 MG tablet   amLODipine (NORVASC) 5 MG tablet   Other Relevant Orders   CBC   TSH   Comprehensive metabolic panel   Lipid panel   Hemoglobin A1c   Back pain    Encouraged moist heat and gentle stretching as  tolerated. May try NSAIDs and prescription meds as directed and report if symptoms worsen or seek immediate care. Norco and Flexeril prn has helped in past is allowed a small amount to help manage the acute pain also add Lidocaine patches      Relevant Medications   cyclobenzaprine (FLEXERIL) 10 MG tablet   HYDROcodone-acetaminophen (NORCO) 5-325 MG tablet   Diabetes mellitus type 2 in obese ([place]) - Primary    hgba1c acceptable, minimize simple carbs. Increase exercise as tolerated. Continue current meds      Relevant Medications   carvedilol (COREG) 12.5 MG tablet   amLODipine (NORVASC) 5 MG tablet   losartan-hydrochlorothiazide (HYZAAR) 100-25 MG tablet   Other Relevant Orders   CBC   TSH   Comprehensive metabolic panel   Lipid panel   Hemoglobin A1c   Dyslipidemia    Encouraged heart healthy diet, increase exercise, avoid trans fats, consider a krill oil cap daily      Relevant Medications   carvedilol (COREG) 12.5 MG tablet   amLODipine (NORVASC) 5 MG tablet   Other Relevant Orders   CBC   TSH   Comprehensive metabolic panel   Lipid panel   Hemoglobin A1c   HTN (hypertension)    Mildly elevated and cough present will try changing from Lisinopril to Losartan hct and recheck BP in one monthEncouraged heart healthy diet such as the DASH diet and exercise as tolerated.       Relevant Medications   carvedilol (COREG) 12.5 MG tablet   amLODipine (NORVASC) 5 MG tablet   losartan-hydrochlorothiazide (HYZAAR) 100-25 MG tablet   Other Relevant Orders   CBC   TSH   Comprehensive metabolic panel   Lipid panel   Hemoglobin A1c   CBC   TSH   Comprehensive metabolic panel   Lipid panel  Hemoglobin A1c   Obesity    Encouraged DASH diet, decrease po intake and increase exercise as tolerated. Needs 7-8 hours of sleep nightly. Avoid trans fats, eat small, frequent meals every 4-5 hours with lean proteins, complex carbs and healthy fats. Minimize simple carbs, GMO foods.       Relevant Medications   carvedilol (COREG) 12.5 MG tablet   amLODipine (NORVASC) 5 MG tablet   Other Relevant Orders   CBC   TSH   Comprehensive metabolic panel   Lipid panel   Hemoglobin A1c      I have discontinued Mr. Abila chlorpheniramine-HYDROcodone, amoxicillin-clavulanate, and lisinopril-hydrochlorothiazide. I am also having him start on losartan-hydrochlorothiazide, cyclobenzaprine, and HYDROcodone-acetaminophen. Additionally, I am having him maintain his omeprazole, carvedilol, and amLODipine.  Meds ordered this encounter  Medications  . carvedilol (COREG) 12.5 MG tablet    Sig: Take 1 tablet (12.5 mg total) by mouth 2 (two) times daily with a meal.    Dispense:  180 tablet    Refill:  3  . amLODipine (NORVASC) 5 MG tablet    Sig: Take 1 tablet (5 mg total) by mouth daily.    Dispense:  90 tablet    Refill:  3  . losartan-hydrochlorothiazide (HYZAAR) 100-25 MG tablet    Sig: Take 1 tablet by mouth daily.    Dispense:  30 tablet    Refill:  2  . cyclobenzaprine (FLEXERIL) 10 MG tablet    Sig: Take 1 tablet (10 mg total) by mouth 2 (two) times daily as needed for muscle spasms.    Dispense:  40 tablet    Refill:  1  . HYDROcodone-acetaminophen (NORCO) 5-325 MG tablet    Sig: Take 1 tablet by mouth 2 (two) times daily as needed for moderate pain.    Dispense:  40 tablet    Refill:  0     Penni Homans, MD

## 2015-12-08 NOTE — Assessment & Plan Note (Signed)
Encouraged heart healthy diet, increase exercise, avoid trans fats, consider a krill oil cap daily 

## 2015-12-08 NOTE — Progress Notes (Signed)
Pre visit review using our clinic review tool, if applicable. No additional management support is needed unless otherwise documented below in the visit note. 

## 2015-12-08 NOTE — Assessment & Plan Note (Signed)
Avoid offending foods, start NOW probiotics. Do not eat large meals in late evening and consider raising head of bed. Continue Prilosec

## 2015-12-08 NOTE — Assessment & Plan Note (Signed)
hgba1c acceptable, minimize simple carbs. Increase exercise as tolerated. Continue current meds 

## 2016-01-05 ENCOUNTER — Ambulatory Visit (INDEPENDENT_AMBULATORY_CARE_PROVIDER_SITE_OTHER): Payer: 59 | Admitting: Family Medicine

## 2016-01-05 VITALS — BP 133/94 | HR 81

## 2016-01-05 DIAGNOSIS — I10 Essential (primary) hypertension: Secondary | ICD-10-CM | POA: Diagnosis not present

## 2016-01-05 NOTE — Progress Notes (Signed)
RN blood pressure check note reviewed. Agree with documention and plan. 

## 2016-01-05 NOTE — Patient Instructions (Signed)
Per Dr. Charlett Blake: Continue current medication regimen and keep follow-up appointment (06/08/16 at 10:00 AM). Call the office if you notice any headaches or other symptoms.

## 2016-01-05 NOTE — Progress Notes (Signed)
Pre visit review using our clinic review tool, if applicable. No additional management support is needed unless otherwise documented below in the visit note.  Patient presents in clinic for blood pressure check per OV note 12/08/15. Current medications reviewed with the patient. Today's readings were as follow: BP 131/97 P 82 & BP 133/94 P 81.  Per Dr. Charlett Blake: Continue current medication regimen and keep follow-up appointment (06/08/16 at 10:00 AM). Call the office if you notice headaches or other symptoms. Informed patient of the provider's instructions. He voiced understanding and did not have any questions or concerns prior to leaving nurse visit.  RN blood pressure check note reviewed. Agree with documention and plan.

## 2016-03-12 ENCOUNTER — Other Ambulatory Visit: Payer: Self-pay | Admitting: Family Medicine

## 2016-06-08 ENCOUNTER — Ambulatory Visit (INDEPENDENT_AMBULATORY_CARE_PROVIDER_SITE_OTHER): Payer: 59 | Admitting: Family Medicine

## 2016-06-08 ENCOUNTER — Encounter: Payer: Self-pay | Admitting: Family Medicine

## 2016-06-08 DIAGNOSIS — M5441 Lumbago with sciatica, right side: Secondary | ICD-10-CM

## 2016-06-08 DIAGNOSIS — E785 Hyperlipidemia, unspecified: Secondary | ICD-10-CM

## 2016-06-08 DIAGNOSIS — K219 Gastro-esophageal reflux disease without esophagitis: Secondary | ICD-10-CM

## 2016-06-08 DIAGNOSIS — I10 Essential (primary) hypertension: Secondary | ICD-10-CM | POA: Diagnosis not present

## 2016-06-08 DIAGNOSIS — E1169 Type 2 diabetes mellitus with other specified complication: Secondary | ICD-10-CM

## 2016-06-08 DIAGNOSIS — E669 Obesity, unspecified: Secondary | ICD-10-CM | POA: Diagnosis not present

## 2016-06-08 DIAGNOSIS — L600 Ingrowing nail: Secondary | ICD-10-CM

## 2016-06-08 DIAGNOSIS — G8929 Other chronic pain: Secondary | ICD-10-CM

## 2016-06-08 LAB — LIPID PANEL
Cholesterol: 147 mg/dL (ref 0–200)
HDL: 35.4 mg/dL — ABNORMAL LOW (ref >39.00)
LDL Cholesterol: 81 mg/dL (ref 0–99)
NonHDL: 111.67
Total CHOL/HDL Ratio: 4
Triglycerides: 155 mg/dL — ABNORMAL HIGH (ref 0.0–149.0)
VLDL: 31 mg/dL (ref 0.0–40.0)

## 2016-06-08 LAB — COMPREHENSIVE METABOLIC PANEL
ALT: 38 U/L (ref 0–53)
AST: 25 U/L (ref 0–37)
Albumin: 4.4 g/dL (ref 3.5–5.2)
Alkaline Phosphatase: 52 U/L (ref 39–117)
BUN: 17 mg/dL (ref 6–23)
CO2: 30 mEq/L (ref 19–32)
Calcium: 9.9 mg/dL (ref 8.4–10.5)
Chloride: 103 mEq/L (ref 96–112)
Creatinine, Ser: 1.29 mg/dL (ref 0.40–1.50)
GFR: 63.37 mL/min (ref >60.00)
Glucose, Bld: 160 mg/dL — ABNORMAL HIGH (ref 70–99)
Potassium: 4 mEq/L (ref 3.5–5.1)
Sodium: 138 mEq/L (ref 135–145)
Total Bilirubin: 0.8 mg/dL (ref 0.2–1.2)
Total Protein: 7.1 g/dL (ref 6.0–8.3)

## 2016-06-08 LAB — CBC
HCT: 46.5 % (ref 39.0–52.0)
Hemoglobin: 15.9 g/dL (ref 13.0–17.0)
MCHC: 34.1 g/dL (ref 30.0–36.0)
MCV: 88.3 fl (ref 78.0–100.0)
Platelets: 223 10*3/uL (ref 150.0–400.0)
RBC: 5.27 Mil/uL (ref 4.22–5.81)
RDW: 13.6 % (ref 11.5–15.5)
WBC: 7.2 10*3/uL (ref 4.0–10.5)

## 2016-06-08 LAB — HEMOGLOBIN A1C: Hgb A1c MFr Bld: 7.1 % — ABNORMAL HIGH (ref 4.6–6.5)

## 2016-06-08 MED ORDER — OMEPRAZOLE 20 MG PO CPDR: 20.0000 mg | DELAYED_RELEASE_CAPSULE | Freq: Every day | ORAL | 11 refills | Status: DC

## 2016-06-08 NOTE — Progress Notes (Signed)
Pre visit review using our clinic review tool, if applicable. No additional management support is needed unless otherwise documented below in the visit note. 

## 2016-06-08 NOTE — Assessment & Plan Note (Signed)
Encouraged DASH diet, decrease po intake and increase exercise as tolerated. Needs 7-8 hours of sleep nightly. Avoid trans fats, eat small, frequent meals every 4-5 hours with lean proteins, complex carbs and healthy fats. Minimize simple carbs. Works in [employer] Has cut down on sodas and simple carbs.

## 2016-06-08 NOTE — Assessment & Plan Note (Signed)
Encouraged heart healthy diet, increase exercise, avoid trans fats, consider a krill oil cap daily 

## 2016-06-08 NOTE — Assessment & Plan Note (Signed)
Denies polyuria or polydipsia. hgba1c acceptable, minimize simple carbs. Increase exercise as tolerated. Continue current meds

## 2016-06-08 NOTE — Assessment & Plan Note (Signed)
Avoid offending foods, take probiotics. Do not eat large meals in late evening and consider raising head of bed.  

## 2016-06-08 NOTE — Assessment & Plan Note (Signed)
no changes to meds. Encouraged heart healthy diet such as the DASH diet and exercise as tolerated.  

## 2016-06-08 NOTE — Assessment & Plan Note (Signed)
Was struggling off and on for several months but is beter now. encouraged to return to gym for more exercise

## 2016-06-08 NOTE — Patient Instructions (Addendum)
Chromium picolinate, garcinia are 2 supplements that can help with weight loss NOW probiotics can get at [facility], [email or web address]  Basic Carbohydrate Counting for Diabetes Mellitus Carbohydrate counting is a method for keeping track of the amount of carbohydrates you eat. Eating carbohydrates naturally increases the level of sugar (glucose) in your blood, so it is important for you to know the amount that is okay for you to have in every meal. Carbohydrate counting helps keep the level of glucose in your blood within normal limits. The amount of carbohydrates allowed is different for every person. A dietitian can help you calculate the amount that is right for you. Once you know the amount of carbohydrates you can have, you can count the carbohydrates in the foods you want to eat. Carbohydrates are found in the following foods:  Grains, such as breads and cereals.  Dried beans and soy products.  Starchy vegetables, such as potatoes, peas, and corn.  Fruit and fruit juices.  Milk and yogurt.  Sweets and snack foods, such as cake, cookies, candy, chips, soft drinks, and fruit drinks. CARBOHYDRATE COUNTING There are two ways to count the carbohydrates in your food. You can use either of the methods or a combination of both. Reading the "Nutrition Facts" on [place] The "Nutrition Facts" is an area that is included on the labels of almost all packaged food and beverages in the [place]. It includes the serving size of that food or beverage and information about the nutrients in each serving of the food, including the grams (g) of carbohydrate per serving.  Decide the number of servings of this food or beverage that you will be able to eat or drink. Multiply that number of servings by the number of grams of carbohydrate that is listed on the label for that serving. The total will be the amount of carbohydrates you will be having when you eat or drink this food or beverage. Learning  Standard Serving Sizes of Food When you eat food that is not packaged or does not include "Nutrition Facts" on the label, you need to measure the servings in order to count the amount of carbohydrates.A serving of most carbohydrate-rich foods contains about 15 g of carbohydrates. The following list includes serving sizes of carbohydrate-rich foods that provide 15 g ofcarbohydrate per serving:   1 slice of bread (1 oz) or 1 six-inch tortilla.    of a hamburger bun or English muffin.  4-6 crackers.   cup unsweetened dry cereal.    cup hot cereal.   cup rice or pasta.    cup mashed potatoes or  of a large baked potato.  1 cup fresh fruit or one small piece of fruit.    cup canned or frozen fruit or fruit juice.  1 cup milk.   cup plain fat-free yogurt or yogurt sweetened with artificial sweeteners.   cup cooked dried beans or starchy vegetable, such as peas, corn, or potatoes.  Decide the number of standard-size servings that you will eat. Multiply that number of servings by 15 (the grams of carbohydrates in that serving). For example, if you eat 2 cups of strawberries, you will have eaten 2 servings and 30 g of carbohydrates (2 servings x 15 g = 30 g). For foods such as soups and casseroles, in which more than one food is mixed in, you will need to count the carbohydrates in each food that is included. EXAMPLE OF CARBOHYDRATE COUNTING Sample Dinner  3 oz chicken breast.  cup of brown rice.   cup of corn.  1 cup milk.   1 cup strawberries with sugar-free whipped topping.  Carbohydrate Calculation Step 1: Identify the foods that contain carbohydrates:   Rice.   Corn.   Milk.   Strawberries. Step 2:Calculate the number of servings eaten of each:   2 servings of rice.   1 serving of corn.   1 serving of milk.   1 serving of strawberries. Step 3: Multiply each of those number of servings by 15 g:   2 servings of rice x 15 g = 30 g.    1 serving of corn x 15 g = 15 g.   1 serving of milk x 15 g = 15 g.   1 serving of strawberries x 15 g = 15 g. Step 4: Add together all of the amounts to find the total grams of carbohydrates eaten: 30 g + 15 g + 15 g + 15 g = 75 g.   This information is not intended to replace advice given to you by your health care provider. Make sure you discuss any questions you have with your health care provider.   Document Released: 07/30/2005 Document Revised: 08/20/2014 Document Reviewed: 06/26/2013 Elsevier Interactive Patient Education [employer].

## 2016-06-08 NOTE — Progress Notes (Signed)
Patient ID: Robert [name], male   DOB: [birth year], 45 y.o.   MRN: [id number]   Subjective:    Patient ID: Robert [name], male    DOB: 06/29/1971, 45 y.o.   MRN: [id number]  Chief Complaint  Patient presents with  . Follow-up    HPI Patient is in today for follow up. He continues to [employer] work and gets poor sleep and eats badly during shifts. He had been doing good at going to gym and eat better til a couple weeks ago. Is feeling well today. No recent illness or hospitalizations. Denies CP/palp/SOB/HA/congestion/fevers/GI or GU c/o. Taking meds as prescribed Past Medical History:  Diagnosis Date  . Arthralgia 01/11/2013   Diffuse, b/l Hips R>L Knees, ankles, low back  . Back pain 12/08/2015  . Diabetes mellitus type 2 in obese ([place]) 11/09/2012  . Dyslipidemia 11/09/2012  . Esophageal reflux 12/08/2015  . History of migraine headaches   . Hyperglycemia 11/09/2012  . Hypertension     Past Surgical History:  Procedure Laterality Date  . NO PAST SURGERIES      Family History  Problem Relation Age of Onset  . Arthritis Mother   . Hypertension Mother   . Diabetes Mother   . Arthritis Father   . Hypertension Father   . Diabetes Father   . Sleep apnea Father   . Obesity Father   . Arthritis Maternal Grandmother   . Diabetes Maternal Grandmother   . Stroke Maternal Grandmother   . Arthritis Paternal Grandmother   . Diabetes Paternal Grandmother   . Cancer Paternal Grandmother     lung, smoker  . Diabetes Paternal Grandfather   . Heart disease Paternal Grandfather 54    MI  . Kidney disease Neg Hx   . Hyperlipidemia Brother   . Hypertension Brother   . Diabetes Brother   . COPD Maternal Grandfather     Social History   Social History  . Marital status: Married    Spouse name: N/A  . Number of children: 4  . 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 Smoking status: Former Smoker    Years: 9.00     Types: Cigarettes    Quit date: 08/14/1995  . Smokeless tobacco: Never Used  . Alcohol use 3.6 - 6.0 oz/week    6 - 10 Cans of beer per week  . Drug use: No  . Sexual activity: Yes     Comment: lives with wife and daughter, no dietary restrictions   Other Topics Concern  . 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 amLODipine (NORVASC) 5 MG tablet Take 1 tablet (5 mg total) by mouth daily. 90 tablet 3  . carvedilol (COREG) 12.5 MG tablet Take 1 tablet (12.5 mg total) by mouth 2 (two) times daily with a meal. 180 tablet 3  . cyclobenzaprine (FLEXERIL) 10 MG tablet Take 1 tablet (10 mg total) by mouth 2 (two) times daily as needed for muscle spasms. 40 tablet 1  .  HYDROcodone-acetaminophen (NORCO) 5-325 MG tablet Take 1 tablet by mouth 2 (two) times daily as needed for moderate pain. 40 tablet 0  . losartan-hydrochlorothiazide (HYZAAR) 100-25 MG tablet TAKE ONE TABLET BY MOUTH ONCE DAILY 30 tablet 6  . omeprazole (PRILOSEC) 20 MG capsule Take 1 capsule (20 mg total) by mouth daily. 30 capsule 11   No facility-administered medications prior to visit.     Allergies  Allergen Reactions  . Mucinex [Guaifenesin Er] Shortness Of Breath, Itching and Dermatitis    Swelling of hands and feet  . Nsaids     Aleve, Advil cause swelling, itching and rash  . Latex Rash    Review of Systems  Constitutional: Positive for malaise/fatigue. Negative for fever.  HENT: Negative for congestion.   Eyes: Negative for blurred vision.  Respiratory: Negative for shortness of breath.   Cardiovascular: Negative for chest pain, palpitations and leg swelling.  Gastrointestinal: Negative for abdominal pain, blood in  stool and nausea.  Genitourinary: Negative for dysuria and frequency.  Musculoskeletal: Positive for joint pain. Negative for falls.  Skin: Negative for rash.  Neurological: Negative for dizziness, loss of consciousness and headaches.  Endo/Heme/Allergies: Negative for environmental allergies.  Psychiatric/Behavioral: Negative for depression. The patient is not nervous/anxious.        Objective:    Physical Exam  Constitutional: He is oriented to person, place, and time. He appears well-developed and well-nourished. No distress.  HENT:  Head: Normocephalic and atraumatic.  Nose: Nose normal.  Eyes: Right eye exhibits no discharge. Left eye exhibits no discharge.  Neck: Normal range of motion. Neck supple.  Cardiovascular: Normal rate and regular rhythm.   No murmur heard. Pulmonary/Chest: Effort normal and breath sounds normal.  Abdominal: Soft. Bowel sounds are normal. There is no tenderness.  Musculoskeletal: He exhibits no edema.  Neurological: He is alert and oriented to person, place, and time.  Skin: Skin is warm and dry.  Psychiatric: He has a normal mood and affect.  Nursing note and vitals reviewed.   BP (!) 144/92   Pulse 70   Temp 98.6 F (37 C) (Oral)   Ht 6\' 3"  (1.905 m)   Wt 291 lb 6 oz (132.2 kg)   SpO2 96%   BMI 36.42 kg/m  Wt Readings from Last 3 Encounters:  06/08/16 291 lb 6 oz (132.2 kg)  12/08/15 287 lb (130.2 kg)  07/20/14 279 lb (126.6 kg)     Lab Results  Component Value Date   WBC 7.2 06/08/2016   HGB 15.9 06/08/2016   HCT 46.5 06/08/2016   PLT 223.0 06/08/2016   GLUCOSE 160 (H) 06/08/2016   CHOL 147 06/08/2016   TRIG 155.0 (H) 06/08/2016   HDL 35.40 (L) 06/08/2016   LDLCALC 81 06/08/2016   ALT 38 06/08/2016   AST 25 06/08/2016   NA 138 06/08/2016   K 4.0 06/08/2016   CL 103 06/08/2016   CREATININE 1.29 06/08/2016   BUN 17 06/08/2016   CO2 30 06/08/2016   TSH 0.81 06/08/2016   HGBA1C 7.1 (H) 06/08/2016    Lab Results    Component Value Date   TSH 0.81 06/08/2016   Lab Results  Component Value Date   WBC 7.2 06/08/2016   HGB 15.9 06/08/2016   HCT 46.5 06/08/2016   MCV 88.3 06/08/2016   PLT 223.0 06/08/2016   Lab Results  Component Value Date   NA 138 06/08/2016   K 4.0 06/08/2016   CO2 30 06/08/2016   GLUCOSE 160 (H)  06/08/2016   BUN 17 06/08/2016   CREATININE 1.29 06/08/2016   BILITOT 0.8 06/08/2016   ALKPHOS 52 06/08/2016   AST 25 06/08/2016   ALT 38 06/08/2016   PROT 7.1 06/08/2016   ALBUMIN 4.4 06/08/2016   CALCIUM 9.9 06/08/2016   GFR 63.37 06/08/2016   Lab Results  Component Value Date   CHOL 147 06/08/2016   Lab Results  Component Value Date   HDL 35.40 (L) 06/08/2016   Lab Results  Component Value Date   LDLCALC 81 06/08/2016   Lab Results  Component Value Date   TRIG 155.0 (H) 06/08/2016   Lab Results  Component Value Date   CHOLHDL 4 06/08/2016   Lab Results  Component Value Date   HGBA1C 7.1 (H) 06/08/2016       Assessment & Plan:   Problem List Items Addressed This Visit    HTN (hypertension)     no changes to meds. Encouraged heart healthy diet such as the DASH diet and exercise as tolerated.       Relevant Orders   TSH (Completed)   CBC (Completed)   Comprehensive metabolic panel (Completed)   Dyslipidemia    Encouraged heart healthy diet, increase exercise, avoid trans fats, consider a krill oil cap daily      Relevant Orders   Lipid panel (Completed)   Diabetes mellitus type 2 in obese [facility])    Denies polyuria or polydipsia. hgba1c acceptable, minimize simple carbs. Increase exercise as tolerated. Continue current meds      Relevant Orders   Hemoglobin A1c (Completed)   Esophageal reflux    Avoid offending foods, take probiotics. Do not eat large meals in late evening and consider raising head of bed.       Relevant Medications   omeprazole (PRILOSEC) 20 MG capsule   Back pain    Was struggling off and on for several months but is  beter now. encouraged to return to gym for more exercise      Ingrown toenail    Soak toes daily in peroxide and hot water and if worsens will need referral to Podiatry       Other Visit Diagnoses   None.     I am having Mr. Overall maintain his carvedilol, amLODipine, cyclobenzaprine, HYDROcodone-acetaminophen, losartan-hydrochlorothiazide, and omeprazole.  Meds ordered this encounter  Medications  . omeprazole (PRILOSEC) 20 MG capsule    Sig: Take 1 capsule (20 mg total) by mouth daily.    Dispense:  30 capsule    Refill:  11     Penni Homans, MD

## 2016-06-11 LAB — TSH: TSH: 0.81 u[IU]/mL (ref 0.35–4.50)

## 2016-06-16 DIAGNOSIS — L600 Ingrowing nail: Secondary | ICD-10-CM | POA: Insufficient documentation

## 2016-06-16 NOTE — Assessment & Plan Note (Signed)
Soak toes daily in peroxide and hot water and if worsens will need referral to Podiatry

## 2016-08-21 ENCOUNTER — Other Ambulatory Visit: Payer: Self-pay | Admitting: Family Medicine

## 2016-08-21 DIAGNOSIS — Z0000 Encounter for general adult medical examination without abnormal findings: Secondary | ICD-10-CM

## 2016-08-21 DIAGNOSIS — I10 Essential (primary) hypertension: Secondary | ICD-10-CM

## 2016-08-21 DIAGNOSIS — I152 Hypertension secondary to endocrine disorders: Secondary | ICD-10-CM

## 2016-08-21 DIAGNOSIS — E1169 Type 2 diabetes mellitus with other specified complication: Secondary | ICD-10-CM

## 2016-08-21 DIAGNOSIS — E669 Obesity, unspecified: Secondary | ICD-10-CM

## 2016-08-21 DIAGNOSIS — E785 Hyperlipidemia, unspecified: Secondary | ICD-10-CM

## 2016-08-21 NOTE — Telephone Encounter (Signed)
Relation to [id number] Call back [phone] Pharmacy: [facility] [place], [place], [place] [address] [phone]     Reason for call:  Patient requesting a refill losartan-hydrochlorothiazide (HYZAAR) 100-25 MG tablet, amLODipine (NORVASC) 5 MG tablet and carvedilol (COREG) 12.5 MG tablet

## 2016-08-22 MED ORDER — CARVEDILOL 12.5 MG PO TABS: 12.5000 mg | ORAL_TABLET | Freq: Two times a day (BID) | ORAL | 1 refills | Status: DC

## 2016-08-22 MED ORDER — LOSARTAN POTASSIUM-HCTZ 100-25 MG PO TABS: 1.0000 | ORAL_TABLET | Freq: Every day | ORAL | 1 refills | Status: DC

## 2016-08-22 MED ORDER — AMLODIPINE BESYLATE 5 MG PO TABS: 5.0000 mg | ORAL_TABLET | Freq: Every day | ORAL | 1 refills | Status: DC

## 2016-08-22 NOTE — Telephone Encounter (Signed)
Requested Rx's sent to [facility].

## 2016-12-11 ENCOUNTER — Ambulatory Visit (INDEPENDENT_AMBULATORY_CARE_PROVIDER_SITE_OTHER): Payer: 59 | Admitting: Family Medicine

## 2016-12-11 ENCOUNTER — Encounter: Payer: Self-pay | Admitting: Family Medicine

## 2016-12-11 ENCOUNTER — Ambulatory Visit (HOSPITAL_BASED_OUTPATIENT_CLINIC_OR_DEPARTMENT_OTHER)
Admission: RE | Admit: 2016-12-11 | Discharge: 2016-12-11 | Disposition: A | Discharge status: Home / Self Care | Payer: 59 | Source: Ambulatory Visit | Attending: Family Medicine | Admitting: Family Medicine

## 2016-12-11 VITALS — BP 136/100 | HR 66 | Temp 98.2°F | Resp 18 | Ht 75.0 in | Wt 281.2 lb

## 2016-12-11 DIAGNOSIS — E669 Obesity, unspecified: Secondary | ICD-10-CM

## 2016-12-11 DIAGNOSIS — I152 Hypertension secondary to endocrine disorders: Secondary | ICD-10-CM | POA: Diagnosis not present

## 2016-12-11 DIAGNOSIS — M4692 Unspecified inflammatory spondylopathy, cervical region: Secondary | ICD-10-CM | POA: Diagnosis not present

## 2016-12-11 DIAGNOSIS — E785 Hyperlipidemia, unspecified: Secondary | ICD-10-CM | POA: Diagnosis not present

## 2016-12-11 DIAGNOSIS — G8929 Other chronic pain: Secondary | ICD-10-CM

## 2016-12-11 DIAGNOSIS — I6523 Occlusion and stenosis of bilateral carotid arteries: Secondary | ICD-10-CM | POA: Diagnosis not present

## 2016-12-11 DIAGNOSIS — R200 Anesthesia of skin: Secondary | ICD-10-CM | POA: Diagnosis not present

## 2016-12-11 DIAGNOSIS — M47812 Spondylosis without myelopathy or radiculopathy, cervical region: Secondary | ICD-10-CM

## 2016-12-11 DIAGNOSIS — Z0000 Encounter for general adult medical examination without abnormal findings: Secondary | ICD-10-CM | POA: Diagnosis not present

## 2016-12-11 DIAGNOSIS — E1169 Type 2 diabetes mellitus with other specified complication: Secondary | ICD-10-CM | POA: Diagnosis not present

## 2016-12-11 DIAGNOSIS — M2550 Pain in unspecified joint: Secondary | ICD-10-CM

## 2016-12-11 DIAGNOSIS — M5441 Lumbago with sciatica, right side: Secondary | ICD-10-CM

## 2016-12-11 DIAGNOSIS — I10 Essential (primary) hypertension: Secondary | ICD-10-CM

## 2016-12-11 LAB — CBC
HCT: 45.3 % (ref 39.0–52.0)
Hemoglobin: 15.5 g/dL (ref 13.0–17.0)
MCHC: 34.2 g/dL (ref 30.0–36.0)
MCV: 87.8 fl (ref 78.0–100.0)
Platelets: 231 10*3/uL (ref 150.0–400.0)
RBC: 5.16 Mil/uL (ref 4.22–5.81)
RDW: 14.1 % (ref 11.5–15.5)
WBC: 5.8 10*3/uL (ref 4.0–10.5)

## 2016-12-11 LAB — LIPID PANEL
Cholesterol: 159 mg/dL (ref 0–200)
HDL: 35.9 mg/dL — ABNORMAL LOW (ref >39.00)
LDL Cholesterol: 106 mg/dL — ABNORMAL HIGH (ref 0–99)
NonHDL: 123.41
Total CHOL/HDL Ratio: 4
Triglycerides: 87 mg/dL (ref 0.0–149.0)
VLDL: 17.4 mg/dL (ref 0.0–40.0)

## 2016-12-11 LAB — COMPREHENSIVE METABOLIC PANEL
ALT: 29 U/L (ref 0–53)
AST: 26 U/L (ref 0–37)
Albumin: 4.4 g/dL (ref 3.5–5.2)
Alkaline Phosphatase: 47 U/L (ref 39–117)
BUN: 17 mg/dL (ref 6–23)
CO2: 28 mEq/L (ref 19–32)
Calcium: 9.7 mg/dL (ref 8.4–10.5)
Chloride: 103 mEq/L (ref 96–112)
Creatinine, Ser: 1.18 mg/dL (ref 0.40–1.50)
GFR: 70.09 mL/min (ref >60.00)
Glucose, Bld: 141 mg/dL — ABNORMAL HIGH (ref 70–99)
Potassium: 3.9 mEq/L (ref 3.5–5.1)
Sodium: 137 mEq/L (ref 135–145)
Total Bilirubin: 0.9 mg/dL (ref 0.2–1.2)
Total Protein: 7 g/dL (ref 6.0–8.3)

## 2016-12-11 LAB — HEMOGLOBIN A1C: Hgb A1c MFr Bld: 7.4 % — ABNORMAL HIGH (ref 4.6–6.5)

## 2016-12-11 LAB — TSH: TSH: 0.89 u[IU]/mL (ref 0.35–4.50)

## 2016-12-11 MED ORDER — CARVEDILOL 12.5 MG PO TABS: 12.5000 mg | ORAL_TABLET | Freq: Two times a day (BID) | ORAL | 1 refills | Status: DC

## 2016-12-11 MED ORDER — LOSARTAN POTASSIUM-HCTZ 100-25 MG PO TABS: 1.0000 | ORAL_TABLET | Freq: Every day | ORAL | 1 refills | Status: DC

## 2016-12-11 MED ORDER — AMLODIPINE BESYLATE 5 MG PO TABS: 5.0000 mg | ORAL_TABLET | Freq: Every day | ORAL | 1 refills | Status: DC

## 2016-12-11 NOTE — Assessment & Plan Note (Signed)
Encouraged moist heat and gentle stretching as tolerated. May try NSAIDs and prescription meds as directed and report if symptoms worsen or seek immediate care 

## 2016-12-11 NOTE — Progress Notes (Signed)
Pre visit review using our clinic review tool, if applicable. No additional management support is needed unless otherwise documented below in the visit note. 

## 2016-12-11 NOTE — Assessment & Plan Note (Signed)
Encouraged heart healthy diet, increase exercise, avoid trans fats, consider a krill oil cap daily 

## 2016-12-11 NOTE — Assessment & Plan Note (Signed)
Avoid offending foods, take probiotics. Do not eat large meals in late evening and consider raising head of bed.  

## 2016-12-11 NOTE — Assessment & Plan Note (Addendum)
Encouraged DASH diet, decrease po intake and increase exercise as tolerated. Needs 7-8 hours of sleep nightly. Avoid trans fats, eat small, frequent meals every 4-5 hours with lean proteins, complex carbs and healthy fats. Minimize simple carbs, consider bariatric referral. Is following a Keto diet for several weeks and has lost 10 # since last visit.

## 2016-12-11 NOTE — Assessment & Plan Note (Signed)
Struggles with his arms falling asleep when he lies on them at night and awakening him. Will proceed with cervical spine xray. Encouraged moist heat and gentle stretching as tolerated. May try NSAIDs and prescription meds as directed and report if symptoms worsen or seek immediate care. Consider topical lidocaine prn

## 2016-12-11 NOTE — Progress Notes (Signed)
Subjective:  I acted as a Education administrator for Dr. Charlett Blake. [place], [place]   Patient ID: Robert [name], male    DOB: 1970/09/29, 46 y.o.   MRN: [id number]  Chief Complaint  Patient presents with  . Annual Exam  . Diabetes  . Hypertension    Diabetes  He presents for his follow-up diabetic visit. He has type 2 diabetes mellitus. His disease course has been improving. Pertinent negatives for hypoglycemia include no headaches or tremors. Pertinent negatives for diabetes include no blurred vision and no chest pain.  Hypertension  This is a chronic problem. The problem has been rapidly improving since onset. Pertinent negatives include no blurred vision, chest pain, headaches, malaise/fatigue, palpitations or shortness of breath.    Patient is in today for an annual exam. Patient c/o of both hand and feet are tingling when he sits for long periods of time or sleeping, when he awakes he feels the tingling on the side that he lies on. He notes a tingling in his feet when he lies down at night but then it improves when he gets up. Hs arms are falling asleep when he lies down at night but then improves when he gets up. No neck pain. No recent trauma or febrile illness. Denies CP/palp/SOB/HA/congestion/fevers/GI or GU c/o. Taking meds as prescribed. No polyuria or polydipsia.  Patient Care Team: [name], MD as PCP - General (Family Medicine)   Past Medical History:  Diagnosis Date  . Arthralgia 01/11/2013   Diffuse, b/l Hips R>L Knees, ankles, low back  . Back pain 12/08/2015  . Diabetes mellitus type 2 in obese ([place]) 11/09/2012  . Dyslipidemia 11/09/2012  . Esophageal reflux 12/08/2015  . History of migraine headaches   . Hyperglycemia 11/09/2012  . Hypertension     Past Surgical History:  Procedure Laterality Date  . NO PAST SURGERIES      Family History  Problem Relation Age of Onset  . Arthritis Mother   . Hypertension Mother   . Diabetes Mother   . Arthritis Father   . Hypertension  Father   . Diabetes Father   . Sleep apnea Father   . Obesity Father   . Arthritis Maternal Grandmother   . Diabetes Maternal Grandmother   . Stroke Maternal Grandmother   . Arthritis Paternal Grandmother   . Diabetes Paternal Grandmother   . Cancer Paternal Grandmother     lung, smoker  . Diabetes Paternal Grandfather   . Heart disease Paternal Grandfather 72    MI  . Hyperlipidemia Brother   . Hypertension Brother   . Diabetes Brother   . COPD Maternal Grandfather   . Kidney disease Neg Hx     Social History   Social History  . Marital status: Married    Spouse name: N/A  . Number of children: 4  . 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 Smoking status: Former Smoker    Years: 9.00    Types: Cigarettes    Quit date: 08/14/1995  . Smokeless tobacco: Never Used  . Alcohol use 3.6 - 6.0 oz/week    6 - 10 Cans of beer per week  . Drug use: No  . Sexual activity: Yes     Comment: lives with wife and daughter, no dietary restrictions   Other Topics Concern  . 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 cyclobenzaprine (FLEXERIL) 10 MG tablet Take 1 tablet (10 mg total) by mouth 2 (two) times daily as needed for muscle spasms. 40 tablet 1  . omeprazole (PRILOSEC) 20 MG capsule Take 1 capsule (20 mg total) by mouth daily. 30 capsule 11  . amLODipine (NORVASC) 5 MG tablet Take 1 tablet (5 mg total) by mouth daily. 90 tablet 1  . carvedilol (COREG) 12.5 MG tablet Take 1 tablet (12.5 mg total) by mouth 2 (two) times daily with a meal. 180 tablet 1  . HYDROcodone-acetaminophen (NORCO) 5-325 MG tablet Take 1 tablet  by mouth 2 (two) times daily as needed for moderate pain. 40 tablet 0  . losartan-hydrochlorothiazide (HYZAAR) 100-25 MG tablet Take 1 tablet by mouth daily. 90 tablet 1   No facility-administered medications prior to visit.     Allergies  Allergen Reactions  . Mucinex [Guaifenesin Er] Shortness Of Breath, Itching and Dermatitis    Swelling of hands and feet  . Nsaids     Aleve, Advil cause swelling, itching and rash  . Latex Rash    Review of Systems  Constitutional: Negative for fever and malaise/fatigue.  HENT: Negative for congestion.   Eyes: Negative for blurred vision.  Respiratory: Negative for cough and shortness of breath.   Cardiovascular: Negative for chest pain, palpitations and leg swelling.  Gastrointestinal: Negative for vomiting.  Genitourinary: Negative for frequency.  Musculoskeletal: Negative for back pain.  Skin: Negative for rash.  Neurological: Positive for tingling. Negative for tremors, focal weakness, loss of consciousness and headaches.       Objective:    Physical Exam  Constitutional: He is oriented to person, place, and time. He appears well-developed and well-nourished. No distress.  HENT:  Head: Normocephalic and atraumatic.  Eyes: Conjunctivae are normal.  Neck: Normal range of motion. No thyromegaly present.  Cardiovascular: Normal rate and regular rhythm.   Pulmonary/Chest: Effort normal and breath sounds normal. He has no wheezes.  Abdominal: Soft. Bowel sounds are normal. There is no tenderness. There is no rebound and no guarding.  Musculoskeletal: Normal range of motion. He exhibits no edema or deformity.  Neurological: He is alert and oriented to person, place, and time.  Skin: Skin is warm and dry. He is not diaphoretic.  Psychiatric: He has a normal mood and affect.    BP (!) 136/100 (BP Location: Left Arm, Patient Position: Sitting, Cuff Size: Large)   Pulse 66   Temp 98.2 F (36.8 C) (Oral)   Resp 18   Ht 6\' 3"  (1.905 m)    Wt 281 lb 3.2 oz (127.6 kg)   SpO2 95%   BMI 35.15 kg/m  Wt Readings from Last 3 Encounters:  12/11/16 281 lb 3.2 oz (127.6 kg)  06/08/16 291 lb 6 oz (132.2 kg)  12/08/15 287 lb (130.2 kg)   BP Readings from Last 3 Encounters:  12/11/16 (!) 136/100  06/08/16 (!) 144/92  01/05/16 (!) 133/94     Immunization History  Administered Date(s) Administered  . Td 08/13/2010    Health Maintenance  Topic Date Due  . PNEUMOCOCCAL POLYSACCHARIDE VACCINE (1) 02/22/1971  .  OPHTHALMOLOGY EXAM  02/22/1979  . HIV Screening  02/22/1984  . HEMOGLOBIN A1C  12/07/2016  . INFLUENZA VACCINE  07/01/2017 (Originally 03/13/2017)  . FOOT EXAM  12/11/2017  . TETANUS/TDAP  08/13/2020    Lab Results  Component Value Date   WBC 7.2 06/08/2016   HGB 15.9 06/08/2016   HCT 46.5 06/08/2016   PLT 223.0 06/08/2016   GLUCOSE 160 (H) 06/08/2016   CHOL 147 06/08/2016   TRIG 155.0 (H) 06/08/2016   HDL 35.40 (L) 06/08/2016   LDLCALC 81 06/08/2016   ALT 38 06/08/2016   AST 25 06/08/2016   NA 138 06/08/2016   K 4.0 06/08/2016   CL 103 06/08/2016   CREATININE 1.29 06/08/2016   BUN 17 06/08/2016   CO2 30 06/08/2016   TSH 0.81 06/08/2016   HGBA1C 7.1 (H) 06/08/2016    Lab Results  Component Value Date   TSH 0.81 06/08/2016   Lab Results  Component Value Date   WBC 7.2 06/08/2016   HGB 15.9 06/08/2016   HCT 46.5 06/08/2016   MCV 88.3 06/08/2016   PLT 223.0 06/08/2016   Lab Results  Component Value Date   NA 138 06/08/2016   K 4.0 06/08/2016   CO2 30 06/08/2016   GLUCOSE 160 (H) 06/08/2016   BUN 17 06/08/2016   CREATININE 1.29 06/08/2016   BILITOT 0.8 06/08/2016   ALKPHOS 52 06/08/2016   AST 25 06/08/2016   ALT 38 06/08/2016   PROT 7.1 06/08/2016   ALBUMIN 4.4 06/08/2016   CALCIUM 9.9 06/08/2016   GFR 63.37 06/08/2016   Lab Results  Component Value Date   CHOL 147 06/08/2016   Lab Results  Component Value Date   HDL 35.40 (L) 06/08/2016   Lab Results  Component Value Date    LDLCALC 81 06/08/2016   Lab Results  Component Value Date   TRIG 155.0 (H) 06/08/2016   Lab Results  Component Value Date   CHOLHDL 4 06/08/2016   Lab Results  Component Value Date   HGBA1C 7.1 (H) 06/08/2016         Assessment & Plan:   Problem List Items Addressed This Visit    HTN (hypertension)    Mildly elevated today, no change in his meds today. Minimize sodium, continue weight loss efforts and RN BP check in 1 month.      Relevant Medications   carvedilol (COREG) 12.5 MG tablet   losartan-hydrochlorothiazide (HYZAAR) 100-25 MG tablet   amLODipine (NORVASC) 5 MG tablet   Other Relevant Orders   CBC   TSH   Annual physical exam - Primary   Relevant Medications   carvedilol (COREG) 12.5 MG tablet   amLODipine (NORVASC) 5 MG tablet   Other Relevant Orders   CBC   Comprehensive metabolic panel   Lipid panel   TSH   Hemoglobin A1c   Dyslipidemia    Encouraged heart healthy diet, increase exercise, avoid trans fats, consider a krill oil cap daily      Relevant Medications   carvedilol (COREG) 12.5 MG tablet   amLODipine (NORVASC) 5 MG tablet   Other Relevant Orders   Lipid panel   Diabetes mellitus type 2 in obese ([facility])    hgba1c acceptable, minimize simple carbs. Increase exercise as tolerated. Continue current meds, declines pneumonia shot.      Relevant Medications   carvedilol (COREG) 12.5 MG tablet   losartan-hydrochlorothiazide (HYZAAR) 100-25 MG tablet   amLODipine (NORVASC) 5 MG tablet   Other Relevant Orders  Hemoglobin A1c   Arthralgia    Struggles with his arms falling asleep when he lies on them at night and awakening him. Will proceed with cervical spine xray. Encouraged moist heat and gentle stretching as tolerated. May try NSAIDs and prescription meds as directed and report if symptoms worsen or seek immediate care. Consider topical lidocaine prn      Relevant Orders   DG Neck Soft Tissue (Completed)   Obesity    Encouraged DASH  diet, decrease po intake and increase exercise as tolerated. Needs 7-8 hours of sleep nightly. Avoid trans fats, eat small, frequent meals every 4-5 hours with lean proteins, complex carbs and healthy fats. Minimize simple carbs, consider bariatric referral. Is following a Keto diet for several weeks and has lost 10 # since last visit.      Relevant Medications   carvedilol (COREG) 12.5 MG tablet   amLODipine (NORVASC) 5 MG tablet   Other Relevant Orders   CBC   Comprehensive metabolic panel   Back pain    Encouraged moist heat and gentle stretching as tolerated. May try NSAIDs and prescription meds as directed and report if symptoms worsen or seek immediate care       Other Visit Diagnoses    Neck arthritis ([place])       Relevant Orders   DG Neck Soft Tissue (Completed)      I have discontinued Mr. Brindle HYDROcodone-acetaminophen. I am also having him maintain his cyclobenzaprine, omeprazole, carvedilol, losartan-hydrochlorothiazide, and amLODipine.  Meds ordered this encounter  Medications  . carvedilol (COREG) 12.5 MG tablet    Sig: Take 1 tablet (12.5 mg total) by mouth 2 (two) times daily with a meal.    Dispense:  180 tablet    Refill:  1  . losartan-hydrochlorothiazide (HYZAAR) 100-25 MG tablet    Sig: Take 1 tablet by mouth daily.    Dispense:  90 tablet    Refill:  1  . amLODipine (NORVASC) 5 MG tablet    Sig: Take 1 tablet (5 mg total) by mouth daily.    Dispense:  90 tablet    Refill:  1    CMA served as Education administrator during this visit. History, Physical and Plan performed by medical provider. Documentation and orders reviewed and attested to.  Penni Homans, MD

## 2016-12-11 NOTE — Patient Instructions (Addendum)
HYDRATE HYDRATE HYDRATE 64 OUNCES OF WATER A DAY  Moist Heat with topical lidocaine for the neck [place]    Preventive Care 40-64 Years, Male Preventive care refers to lifestyle choices and visits with your health care provider that can promote health and wellness. What does preventive care include?  A yearly physical exam. This is also called an annual well check.  Dental exams once or twice a year.  Routine eye exams. Ask your health care provider how often you should have your eyes checked.  Personal lifestyle choices, including:  Daily care of your teeth and gums.  Regular physical activity.  Eating a healthy diet.  Avoiding tobacco and drug use.  Limiting alcohol use.  Practicing safe sex.  Taking low-dose aspirin every day starting at age 27. What happens during an annual well check? The services and screenings done by your health care provider during your annual well check will depend on your age, overall health, lifestyle risk factors, and family history of disease. Counseling  Your health care provider may ask you questions about your:  Alcohol use.  Tobacco use.  Drug use.  Emotional well-being.  Home and relationship well-being.  Sexual activity.  Eating habits.  Work and work Statistician. Screening  You may have the following tests or measurements:  Height, weight, and BMI.  Blood pressure.  Lipid and cholesterol levels. These may be checked every 5 years, or more frequently if you are over 55 years old.  Skin check.  Lung cancer screening. You may have this screening every year starting at age 30 if you have a 30-pack-year history of smoking and currently smoke or have quit within the past 15 years.  Fecal occult blood test (FOBT) of the stool. You may have this test every year starting at age 25.  Flexible sigmoidoscopy or colonoscopy. You may have a sigmoidoscopy every 5 years or a colonoscopy every 10 years starting at age  75.  Prostate cancer screening. Recommendations will vary depending on your family history and other risks.  Hepatitis C blood test.  Hepatitis B blood test.  Sexually transmitted disease (STD) testing.  Diabetes screening. This is done by checking your blood sugar (glucose) after you have not eaten for a while (fasting). You may have this done every 1-3 years. Discuss your test results, treatment options, and if necessary, the need for more tests with your health care provider. Vaccines  Your health care provider may recommend certain vaccines, such as:  Influenza vaccine. This is recommended every year.  Tetanus, diphtheria, and acellular pertussis (Tdap, Td) vaccine. You may need a Td booster every 10 years.  Varicella vaccine. You may need this if you have not been vaccinated.  Zoster vaccine. You may need this after age 43.  Measles, mumps, and rubella (MMR) vaccine. You may need at least one dose of MMR if you were born in 1957 or later. You may also need a second dose.  Pneumococcal 13-valent conjugate (PCV13) vaccine. You may need this if you have certain conditions and have not been vaccinated.  Pneumococcal polysaccharide (PPSV23) vaccine. You may need one or two doses if you smoke cigarettes or if you have certain conditions.  Meningococcal vaccine. You may need this if you have certain conditions.  Hepatitis A vaccine. You may need this if you have certain conditions or if you travel or work in places where you may be exposed to hepatitis A.  Hepatitis B vaccine. You may need this if you have certain  conditions or if you travel or work in places where you may be exposed to hepatitis B.  Haemophilus influenzae type b (Hib) vaccine. You may need this if you have certain risk factors. Talk to your health care provider about which screenings and vaccines you need and how often you need them. This information is not intended to replace advice given to you by your health  care provider. Make sure you discuss any questions you have with your health care provider. Document Released: 08/26/2015 Document Revised: 04/18/2016 Document Reviewed: 05/31/2015 Elsevier Interactive Patient Education  2017 [employer].

## 2016-12-11 NOTE — Assessment & Plan Note (Addendum)
hgba1c acceptable, minimize simple carbs. Increase exercise as tolerated. Continue current meds, declines pneumonia shot.

## 2016-12-11 NOTE — Assessment & Plan Note (Signed)
Mildly elevated today, no change in his meds today. Minimize sodium, continue weight loss efforts and RN BP check in 1 month.

## 2016-12-12 ENCOUNTER — Encounter: Payer: Self-pay | Admitting: Family Medicine

## 2016-12-13 ENCOUNTER — Other Ambulatory Visit: Payer: Self-pay | Admitting: Family Medicine

## 2016-12-13 MED ORDER — METFORMIN HCL 500 MG PO TABS: 500.0000 mg | ORAL_TABLET | Freq: Every day | ORAL | 3 refills | Status: DC

## 2016-12-21 ENCOUNTER — Other Ambulatory Visit: Payer: Self-pay

## 2016-12-21 DIAGNOSIS — I739 Peripheral vascular disease, unspecified: Principal | ICD-10-CM

## 2016-12-21 DIAGNOSIS — I779 Disorder of arteries and arterioles, unspecified: Secondary | ICD-10-CM

## 2016-12-24 ENCOUNTER — Telehealth: Payer: Self-pay | Admitting: Family Medicine

## 2016-12-24 NOTE — Telephone Encounter (Signed)
Last appt 12/11/16

## 2016-12-24 NOTE — Telephone Encounter (Signed)
Caller name: Relationship to patient: Self Can be reached: [phone]  Pharmacy:  [facility] [place], [place]. [phone] (Phone) [phone] (Fax)     Reason for call: Refill cyclobenzaprine (FLEXERIL) 10 MG tablet [[id number]

## 2016-12-24 NOTE — Telephone Encounter (Signed)
OK to refill the cyclobenzaprine with same sig, #40 with 1 rf

## 2016-12-25 ENCOUNTER — Encounter: Payer: Self-pay | Admitting: Family Medicine

## 2016-12-25 MED ORDER — CYCLOBENZAPRINE HCL 10 MG PO TABS: 10.0000 mg | ORAL_TABLET | Freq: Two times a day (BID) | ORAL | 1 refills | Status: DC | PRN

## 2016-12-25 NOTE — Telephone Encounter (Signed)
Sent in as instructed 

## 2016-12-25 NOTE — Telephone Encounter (Signed)
Patient notified

## 2016-12-27 ENCOUNTER — Ambulatory Visit (HOSPITAL_BASED_OUTPATIENT_CLINIC_OR_DEPARTMENT_OTHER)
Admission: RE | Admit: 2016-12-27 | Discharge: 2016-12-27 | Disposition: A | Discharge status: Home / Self Care | Payer: 59 | Source: Ambulatory Visit | Attending: Family Medicine | Admitting: Family Medicine

## 2016-12-27 DIAGNOSIS — I6529 Occlusion and stenosis of unspecified carotid artery: Secondary | ICD-10-CM | POA: Diagnosis not present

## 2016-12-27 DIAGNOSIS — I779 Disorder of arteries and arterioles, unspecified: Secondary | ICD-10-CM

## 2016-12-27 DIAGNOSIS — I6523 Occlusion and stenosis of bilateral carotid arteries: Secondary | ICD-10-CM | POA: Insufficient documentation

## 2016-12-27 DIAGNOSIS — I739 Peripheral vascular disease, unspecified: Secondary | ICD-10-CM

## 2017-01-10 ENCOUNTER — Other Ambulatory Visit: Payer: Self-pay | Admitting: Family Medicine

## 2017-01-10 DIAGNOSIS — E785 Hyperlipidemia, unspecified: Secondary | ICD-10-CM

## 2017-01-10 DIAGNOSIS — E1169 Type 2 diabetes mellitus with other specified complication: Secondary | ICD-10-CM

## 2017-01-10 DIAGNOSIS — I10 Essential (primary) hypertension: Secondary | ICD-10-CM

## 2017-01-10 DIAGNOSIS — Z0000 Encounter for general adult medical examination without abnormal findings: Secondary | ICD-10-CM

## 2017-01-10 DIAGNOSIS — E669 Obesity, unspecified: Secondary | ICD-10-CM

## 2017-01-10 DIAGNOSIS — I152 Hypertension secondary to endocrine disorders: Secondary | ICD-10-CM

## 2017-01-15 ENCOUNTER — Ambulatory Visit (INDEPENDENT_AMBULATORY_CARE_PROVIDER_SITE_OTHER): Payer: 59 | Admitting: Family Medicine

## 2017-01-15 VITALS — BP 124/76 | HR 73

## 2017-01-15 DIAGNOSIS — I10 Essential (primary) hypertension: Secondary | ICD-10-CM

## 2017-01-15 NOTE — Progress Notes (Signed)
Pre visit review using our clinic tool,if applicable. No additional management support is needed unless otherwise documented below in the visit note.   Patient in for BP check per order from Dr. Frederik Pear dated 12/11/16.  Patient states he has had BP medication this am. Brought  BP log in as requested by Dr. Charlett Blake. Patient has no complaints this visit. States he was advised to pick up glucometer at this visit. Started on Metformin recently.   BP today = 124/76        P=73  Per Dr. Charlett Blake patient to continue medications as ordered and return for office visit with her in 4-6 months. Patient agreed and will schedule appointment.  Nurse blood pressure check note reviewed. Agree with documention and plan.

## 2017-01-15 NOTE — Progress Notes (Signed)
Nurseblood pressure check note reviewed. Agree with documention and plan. 

## 2017-03-25 ENCOUNTER — Other Ambulatory Visit: Payer: Self-pay | Admitting: Family Medicine

## 2017-07-18 ENCOUNTER — Ambulatory Visit (INDEPENDENT_AMBULATORY_CARE_PROVIDER_SITE_OTHER): Payer: 59 | Admitting: Family Medicine

## 2017-07-18 ENCOUNTER — Encounter: Payer: Self-pay | Admitting: Family Medicine

## 2017-07-18 DIAGNOSIS — L600 Ingrowing nail: Secondary | ICD-10-CM

## 2017-07-18 DIAGNOSIS — E785 Hyperlipidemia, unspecified: Secondary | ICD-10-CM

## 2017-07-18 DIAGNOSIS — K219 Gastro-esophageal reflux disease without esophagitis: Secondary | ICD-10-CM | POA: Diagnosis not present

## 2017-07-18 DIAGNOSIS — E1169 Type 2 diabetes mellitus with other specified complication: Secondary | ICD-10-CM | POA: Diagnosis not present

## 2017-07-18 DIAGNOSIS — I10 Essential (primary) hypertension: Secondary | ICD-10-CM | POA: Diagnosis not present

## 2017-07-18 DIAGNOSIS — E669 Obesity, unspecified: Secondary | ICD-10-CM | POA: Diagnosis not present

## 2017-07-18 LAB — COMPREHENSIVE METABOLIC PANEL
ALT: 37 U/L (ref 0–53)
AST: 46 U/L — ABNORMAL HIGH (ref 0–37)
Albumin: 4.4 g/dL (ref 3.5–5.2)
Alkaline Phosphatase: 47 U/L (ref 39–117)
BUN: 14 mg/dL (ref 6–23)
CO2: 29 mEq/L (ref 19–32)
Calcium: 9.1 mg/dL (ref 8.4–10.5)
Chloride: 105 mEq/L (ref 96–112)
Creatinine, Ser: 1.04 mg/dL (ref 0.40–1.50)
GFR: 80.88 mL/min (ref >60.00)
Glucose, Bld: 131 mg/dL — ABNORMAL HIGH (ref 70–99)
Potassium: 3.7 mEq/L (ref 3.5–5.1)
Sodium: 140 mEq/L (ref 135–145)
Total Bilirubin: 0.7 mg/dL (ref 0.2–1.2)
Total Protein: 6.7 g/dL (ref 6.0–8.3)

## 2017-07-18 LAB — CBC
HCT: 47.4 % (ref 39.0–52.0)
Hemoglobin: 15.8 g/dL (ref 13.0–17.0)
MCHC: 33.3 g/dL (ref 30.0–36.0)
MCV: 90.4 fl (ref 78.0–100.0)
Platelets: 213 10*3/uL (ref 150.0–400.0)
RBC: 5.25 Mil/uL (ref 4.22–5.81)
RDW: 14.1 % (ref 11.5–15.5)
WBC: 7 10*3/uL (ref 4.0–10.5)

## 2017-07-18 LAB — LIPID PANEL
Cholesterol: 152 mg/dL (ref 0–200)
HDL: 36.5 mg/dL — ABNORMAL LOW (ref >39.00)
LDL Cholesterol: 89 mg/dL (ref 0–99)
NonHDL: 115.59
Total CHOL/HDL Ratio: 4
Triglycerides: 134 mg/dL (ref 0.0–149.0)
VLDL: 26.8 mg/dL (ref 0.0–40.0)

## 2017-07-18 LAB — HEMOGLOBIN A1C: Hgb A1c MFr Bld: 6.8 % — ABNORMAL HIGH (ref 4.6–6.5)

## 2017-07-18 NOTE — Assessment & Plan Note (Signed)
controlled, no changes to meds. Encouraged heart healthy diet such as the DASH diet and exercise as tolerated.

## 2017-07-18 NOTE — Patient Instructions (Addendum)
BP<145/90 call if running higher considertently for adjusntmen Soak feet in distilled white vinegar and hot water nightly Add Folic Acid tab 431 and 1000 mcg daily  MIND/DASH Eating Plan DASH stands for "Dietary Approaches to Stop Hypertension." The DASH eating plan is a healthy eating plan that has been shown to reduce high blood pressure (hypertension). It may also reduce your risk for type 2 diabetes, heart disease, and stroke. The DASH eating plan may also help with weight loss. What are tips for following this plan? General guidelines  Avoid eating more than 2,300 mg (milligrams) of salt (sodium) a day. If you have hypertension, you may need to reduce your sodium intake to 1,500 mg a day.  Limit alcohol intake to no more than 1 drink a day for nonpregnant women and 2 drinks a day for men. One drink equals 12 oz of beer, 5 oz of wine, or 1 oz of hard liquor.  Work with your health care provider to maintain a healthy body weight or to lose weight. Ask what an ideal weight is for you.  Get at least 30 minutes of exercise that causes your heart to beat faster (aerobic exercise) most days of the week. Activities may include walking, swimming, or biking.  Work with your health care provider or diet and nutrition specialist (dietitian) to adjust your eating plan to your individual calorie needs. Reading food labels  Check food labels for the amount of sodium per serving. Choose foods with less than 5 percent of the Daily Value of sodium. Generally, foods with less than 300 mg of sodium per serving fit into this eating plan.  To find whole grains, look for the word "whole" as the first word in the ingredient list. Shopping  Buy products labeled as "low-sodium" or "no salt added."  Buy fresh foods. Avoid canned foods and premade or frozen meals. Cooking  Avoid adding salt when cooking. Use salt-free seasonings or herbs instead of table salt or sea salt. Check with your health care provider  or pharmacist before using salt substitutes.  Do not fry foods. Cook foods using healthy methods such as baking, boiling, grilling, and broiling instead.  Cook with heart-healthy oils, such as olive, canola, soybean, or sunflower oil. Meal planning   Eat a balanced diet that includes: ? 5 or more servings of fruits and vegetables each day. At each meal, try to fill half of your plate with fruits and vegetables. ? Up to 6-8 servings of whole grains each day. ? Less than 6 oz of lean meat, poultry, or fish each day. A 3-oz serving of meat is about the same size as a deck of cards. One egg equals 1 oz. ? 2 servings of low-fat dairy each day. ? A serving of nuts, seeds, or beans 5 times each week. ? Heart-healthy fats. Healthy fats called Omega-3 fatty acids are found in foods such as flaxseeds and coldwater fish, like sardines, salmon, and mackerel.  Limit how much you eat of the following: ? Canned or prepackaged foods. ? Food that is high in trans fat, such as fried foods. ? Food that is high in saturated fat, such as fatty meat. ? Sweets, desserts, sugary drinks, and other foods with added sugar. ? Full-fat dairy products.  Do not salt foods before eating.  Try to eat at least 2 vegetarian meals each week.  Eat more home-cooked food and less restaurant, buffet, and fast food.  When eating at a restaurant, ask that your food be  prepared with less salt or no salt, if possible. What foods are recommended? The items listed may not be a complete list. Talk with your dietitian about what dietary choices are best for you. Grains Whole-grain or whole-wheat bread. Whole-grain or whole-wheat pasta. Brown rice. Robert [name]. Bulgur. Whole-grain and low-sodium cereals. Pita bread. Low-fat, low-sodium crackers. Whole-wheat flour tortillas. Vegetables Fresh or frozen vegetables (raw, steamed, roasted, or grilled). Low-sodium or reduced-sodium tomato and vegetable juice. Low-sodium or  reduced-sodium tomato sauce and tomato paste. Low-sodium or reduced-sodium canned vegetables. Fruits All fresh, dried, or frozen fruit. Canned fruit in natural juice (without added sugar). Meat and other protein foods Skinless chicken or [place]. Ground chicken or [place]. Pork with fat trimmed off. Fish and seafood. Egg whites. Dried beans, peas, or lentils. Unsalted nuts, nut butters, and seeds. Unsalted canned beans. Lean cuts of beef with fat trimmed off. Low-sodium, lean deli meat. Dairy Low-fat (1%) or fat-free (skim) milk. Fat-free, low-fat, or reduced-fat cheeses. Nonfat, low-sodium ricotta or cottage cheese. Low-fat or nonfat yogurt. Low-fat, low-sodium cheese. Fats and oils Soft margarine without trans fats. Vegetable oil. Low-fat, reduced-fat, or light mayonnaise and salad dressings (reduced-sodium). Canola, safflower, olive, soybean, and sunflower oils. Avocado. Seasoning and other foods Herbs. Spices. Seasoning mixes without salt. Unsalted popcorn and pretzels. Fat-free sweets. What foods are not recommended? The items listed may not be a complete list. Talk with your dietitian about what dietary choices are best for you. Grains Baked goods made with fat, such as croissants, muffins, or some breads. Dry pasta or rice meal packs. Vegetables Creamed or fried vegetables. Vegetables in a cheese sauce. Regular canned vegetables (not low-sodium or reduced-sodium). Regular canned tomato sauce and paste (not low-sodium or reduced-sodium). Regular tomato and vegetable juice (not low-sodium or reduced-sodium). Robert [name]. Olives. Fruits Canned fruit in a light or heavy syrup. Fried fruit. Fruit in cream or butter sauce. Meat and other protein foods Fatty cuts of meat. Ribs. Fried meat. Robert [name]. Sausage. Bologna and other processed lunch meats. Salami. Fatback. Hotdogs. Bratwurst. Salted nuts and seeds. Canned beans with added salt. Canned or smoked fish. Whole eggs or egg yolks. Chicken or [place]  with skin. Dairy Whole or 2% milk, cream, and half-and-half. Whole or full-fat cream cheese. Whole-fat or sweetened yogurt. Full-fat cheese. Nondairy creamers. Whipped toppings. Processed cheese and cheese spreads. Fats and oils Butter. Stick margarine. Lard. Shortening. Ghee. Bacon fat. Tropical oils, such as coconut, palm kernel, or palm oil. Seasoning and other foods Salted popcorn and pretzels. Onion salt, garlic salt, seasoned salt, table salt, and sea salt. Worcestershire sauce. Tartar sauce. Barbecue sauce. Teriyaki sauce. Soy sauce, including reduced-sodium. Steak sauce. Canned and packaged gravies. Fish sauce. Oyster sauce. Cocktail sauce. Horseradish that you find on the shelf. Ketchup. Mustard. Meat flavorings and tenderizers. Bouillon cubes. Hot sauce and Tabasco sauce. Premade or packaged marinades. Premade or packaged taco seasonings. Relishes. Regular salad dressings. Where to find more information:  National Heart, Lung, and [place]: https://wilson-eaton.com/  American Heart Association: www.heart.org Summary  The DASH eating plan is a healthy eating plan that has been shown to reduce high blood pressure (hypertension). It may also reduce your risk for type 2 diabetes, heart disease, and stroke.  With the DASH eating plan, you should limit salt (sodium) intake to 2,300 mg a day. If you have hypertension, you may need to reduce your sodium intake to 1,500 mg a day.  When on the DASH eating plan, aim to eat more fresh fruits and vegetables, whole grains,  lean proteins, low-fat dairy, and heart-healthy fats.  Work with your health care provider or diet and nutrition specialist (dietitian) to adjust your eating plan to your individual calorie needs. This information is not intended to replace advice given to you by your health care provider. Make sure you discuss any questions you have with your health care provider. Document Released: 07/19/2011 Document Revised: 07/23/2016  Document Reviewed: 07/23/2016 Elsevier Interactive Patient Education  2017 [employer].

## 2017-07-18 NOTE — Assessment & Plan Note (Signed)
Avoid offending foods, start probiotics. Do not eat large meals in late evening and consider raising head of bed.  

## 2017-07-18 NOTE — Progress Notes (Addendum)
Subjective:  I acted as a Education administrator for [employer]. Robert [name], [name]   Patient ID: Robert [name], male    DOB: 1971/04/08, 46 y.o.   MRN: [id number]  No chief complaint on file.   HPI  Patient is in today for follow up visit and overall feeling well. He continues to work hard in [employer]. He has tried numerous strategies for weight loss and is frustrated about oack of weight loss. Tried the modified Keto diet and is now trying poriton control and has cut 60 % of sugars out of diet. No polyuria or polydipsia. Is not checking sugars regularly but they were good when he checked them. Is tolerating the Metformin. Denies CP/palp/SOB/HA/congestion/fevers/GI or GU c/o. Taking meds as prescribed  Patient Care Team: [name], MD as PCP - General (Family Medicine)   Past Medical History:  Diagnosis Date  . Arthralgia 01/11/2013   Diffuse, b/l Hips R>L Knees, ankles, low back  . Back pain 12/08/2015  . Diabetes mellitus type 2 in obese ([name]) 11/09/2012  . Dyslipidemia 11/09/2012  . Esophageal reflux 12/08/2015  . History of migraine headaches   . Hyperglycemia 11/09/2012  . Hypertension     Past Surgical History:  Procedure Laterality Date  . NO PAST SURGERIES      Family History  Problem Relation Age of Onset  . Arthritis Mother   . Hypertension Mother   . Diabetes Mother   . Arthritis Father   . Hypertension Father   . Diabetes Father   . Sleep apnea Father   . Obesity Father   . Arthritis Maternal Grandmother   . Diabetes Maternal Grandmother   . Stroke Maternal Grandmother   . Arthritis Paternal Grandmother   . Diabetes Paternal Grandmother   . Cancer Paternal Grandmother        lung, smoker  . Diabetes Paternal Grandfather   . Heart disease Paternal Grandfather 12       MI  . Hyperlipidemia Brother   . Hypertension Brother   . Diabetes Brother   . COPD Maternal Grandfather   . Kidney disease Neg Hx     Social History   Socioeconomic History  . Marital  status: Married    Spouse name: Not on file  . Number of children: 4  . Years of education: Not on file  . Highest education level: Not on file  Social Needs  . Financial resource strain: Not on file  . Food insecurity - worry: Not on file  . Food insecurity - inability: Not on file  . Transportation needs - medical: Not on file  . Transportation needs - non-medical: Not on file  Occupational History  . Occupation: Software engineer: [employer]  Tobacco Use  . Smoking status: Former Smoker    Years: 9.00    Types: Cigarettes    Last attempt to quit: 08/14/1995    Years since quitting: 21.9  . Smokeless tobacco: Never Used  Substance and Sexual Activity  . Alcohol use: Yes    Alcohol/week: 3.6 - 6.0 oz    Types: 6 - 10 Cans of beer per week  . Drug use: No  . Sexual activity: Yes    Comment: lives with wife and daughter, no dietary restrictions  Other Topics Concern  . 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 amLODipine (NORVASC) 5 MG tablet Take 1 tablet (5 mg total) by mouth daily. 90 tablet 3  . carvedilol (COREG) 12.5 MG tablet Take 1 tablet (12.5 mg total) by mouth 2 (two) times daily with a meal. 180 tablet 3  . cyclobenzaprine (FLEXERIL) 10 MG tablet Take 1 tablet (10 mg total) by mouth 2 (two) times daily as needed for muscle spasms. 40 tablet 1  . losartan-hydrochlorothiazide (HYZAAR) 100-25 MG tablet Take 1 tablet by mouth daily. 90 tablet 3  . metFORMIN (GLUCOPHAGE) 500 MG tablet TAKE 1 TABLET (500 MG TOTAL) BY MOUTH DAILY WITH BREAKFAST. 30 tablet 3  . omeprazole (PRILOSEC) 20 MG capsule Take 1 capsule (20 mg total) by mouth daily. 30 capsule 11  . amLODipine  (NORVASC) 5 MG tablet Take 1 tablet (5 mg total) by mouth daily. 90 tablet 1  . carvedilol (COREG) 12.5 MG tablet Take 1 tablet (12.5 mg total) by mouth 2 (two) times daily with a meal. 180 tablet 1  . losartan-hydrochlorothiazide (HYZAAR) 100-25 MG tablet Take 1 tablet by mouth daily. 90 tablet 1   No facility-administered medications prior to visit.     Allergies  Allergen Reactions  . Mucinex [Guaifenesin Er] Shortness Of Breath, Itching and Dermatitis    Swelling of hands and feet  . Nsaids     Aleve, Advil cause swelling, itching and rash  . Latex Rash    Review of Systems  Constitutional: Negative for fever and malaise/fatigue.  HENT: Negative for congestion.   Eyes: Negative for blurred vision.  Respiratory: Negative for shortness of breath.   Cardiovascular: Negative for chest pain, palpitations and leg swelling.  Gastrointestinal: Positive for heartburn. Negative for abdominal pain, blood in stool and nausea.  Genitourinary: Negative for dysuria and frequency.  Musculoskeletal: Negative for falls.  Skin: Negative for rash.  Neurological: Negative for dizziness, loss of consciousness and headaches.  Endo/Heme/Allergies: Negative for environmental allergies.  Psychiatric/Behavioral: Negative for depression. The patient is not nervous/anxious.        Objective:    Physical Exam  Constitutional: He is oriented to person, place, and time. He appears well-developed and well-nourished. No distress.  HENT:  Head: Normocephalic and atraumatic.  Nose: Nose normal.  Eyes: Right eye exhibits no discharge. Left eye exhibits no discharge.  Neck: Normal range of motion. Neck supple.  Cardiovascular: Normal rate and regular rhythm.  No murmur heard. Pulmonary/Chest: Effort normal and breath sounds normal.  Abdominal: Soft. Bowel sounds are normal. There is no tenderness.  Musculoskeletal: He exhibits no edema.  Neurological: He is alert and oriented to person, place, and time.    Skin: Skin is warm and dry.  Psychiatric: He has a normal mood and affect.  Nursing note and vitals reviewed.   BP (!) 154/90 (BP Location: Left Arm, Patient Position: Sitting, Cuff Size: Large)   Pulse 60   Temp 98.9 F (37.2 C) (Oral)   Resp 16   Ht 6' 3.2" (1.91 m)   Wt 288 lb (130.6 kg)   SpO2 97%   BMI 35.81 kg/m  Wt Readings from Last 3 Encounters:  07/18/17 288 lb (130.6 kg)  12/11/16 281 lb 3.2 oz (127.6 kg)  06/08/16 291 lb 6 oz (  132.2 kg)   BP Readings from Last 3 Encounters:  07/18/17 (!) 154/90  01/15/17 124/76  12/11/16 (!) 136/100     Immunization History  Administered Date(s) Administered  . Td 08/13/2010    Health Maintenance  Topic Date Due  . PNEUMOCOCCAL POLYSACCHARIDE VACCINE (1) 02/22/1971  . OPHTHALMOLOGY EXAM  02/22/1979  . HIV Screening  02/22/1984  . HEMOGLOBIN A1C  06/13/2017  . INFLUENZA VACCINE  03/20/2018 (Originally 03/13/2017)  . FOOT EXAM  12/11/2017  . TETANUS/TDAP  08/13/2020    Lab Results  Component Value Date   WBC 5.8 12/11/2016   HGB 15.5 12/11/2016   HCT 45.3 12/11/2016   PLT 231.0 12/11/2016   GLUCOSE 141 (H) 12/11/2016   CHOL 159 12/11/2016   TRIG 87.0 12/11/2016   HDL 35.90 (L) 12/11/2016   LDLCALC 106 (H) 12/11/2016   ALT 29 12/11/2016   AST 26 12/11/2016   NA 137 12/11/2016   K 3.9 12/11/2016   CL 103 12/11/2016   CREATININE 1.18 12/11/2016   BUN 17 12/11/2016   CO2 28 12/11/2016   TSH 0.89 12/11/2016   HGBA1C 7.4 (H) 12/11/2016    Lab Results  Component Value Date   TSH 0.89 12/11/2016   Lab Results  Component Value Date   WBC 5.8 12/11/2016   HGB 15.5 12/11/2016   HCT 45.3 12/11/2016   MCV 87.8 12/11/2016   PLT 231.0 12/11/2016   Lab Results  Component Value Date   NA 137 12/11/2016   K 3.9 12/11/2016   CO2 28 12/11/2016   GLUCOSE 141 (H) 12/11/2016   BUN 17 12/11/2016   CREATININE 1.18 12/11/2016   BILITOT 0.9 12/11/2016   ALKPHOS 47 12/11/2016   AST 26 12/11/2016   ALT 29 12/11/2016    PROT 7.0 12/11/2016   ALBUMIN 4.4 12/11/2016   CALCIUM 9.7 12/11/2016   GFR 70.09 12/11/2016   Lab Results  Component Value Date   CHOL 159 12/11/2016   Lab Results  Component Value Date   HDL 35.90 (L) 12/11/2016   Lab Results  Component Value Date   LDLCALC 106 (H) 12/11/2016   Lab Results  Component Value Date   TRIG 87.0 12/11/2016   Lab Results  Component Value Date   CHOLHDL 4 12/11/2016   Lab Results  Component Value Date   HGBA1C 7.4 (H) 12/11/2016         Assessment & Plan:   Problem List Items Addressed This Visit    HTN (hypertension)     controlled, no changes to meds. Encouraged heart healthy diet such as the DASH diet and exercise as tolerated.       Relevant Orders   CBC   Comprehensive metabolic panel   TSH   Dyslipidemia    Encouraged heart healthy diet, increase exercise, avoid trans fats, consider a krill oil cap daily      Relevant Orders   Lipid panel   Diabetes mellitus type 2 in obese ([facility])    hgba1c acceptable, minimize simple carbs. Increase exercise as tolerated. Continue current meds      Relevant Orders   Hemoglobin A1c   Obesity    Encouraged DASH diet, decrease po intake and increase exercise as tolerated. Needs 7-8 hours of sleep nightly. Avoid trans fats, eat small, frequent meals every 4-5 hours with lean proteins, complex carbs and healthy fats. Minimize simple carbs, bariatric referral. Is frustrated having cut 60% of carbs out of diet and still has not lost weight. Tried the  modified Keto diet.       Esophageal reflux    Avoid offending foods, start probiotics. Do not eat large meals in late evening and consider raising head of bed.       Ingrown toenail    B/l great toenails with onychomycosis on left and a possible plantar wart, is sent to podiatry for further consideration         I am having [name] maintain his omeprazole, cyclobenzaprine, amLODipine, carvedilol, losartan-hydrochlorothiazide, and  metFORMIN.  No orders of the defined types were placed in this encounter.   CMA served as Education administrator during this visit. History, Physical and Plan performed by medical provider. Documentation and orders reviewed and attested to.  Penni Homans, MD

## 2017-07-18 NOTE — Assessment & Plan Note (Signed)
Encouraged heart healthy diet, increase exercise, avoid trans fats, consider a krill oil cap daily 

## 2017-07-18 NOTE — Assessment & Plan Note (Addendum)
Encouraged DASH diet, decrease po intake and increase exercise as tolerated. Needs 7-8 hours of sleep nightly. Avoid trans fats, eat small, frequent meals every 4-5 hours with lean proteins, complex carbs and healthy fats. Minimize simple carbs, bariatric referral. Is frustrated having cut 60% of carbs out of diet and still has not lost weight. Tried the modified Keto diet.

## 2017-07-18 NOTE — Assessment & Plan Note (Signed)
hgba1c acceptable, minimize simple carbs. Increase exercise as tolerated. Continue current meds 

## 2017-07-18 NOTE — Assessment & Plan Note (Signed)
B/l great toenails with onychomycosis on left and a possible plantar wart, is sent to podiatry for further consideration

## 2017-07-19 LAB — TSH: TSH: 0.75 u[IU]/mL (ref 0.35–4.50)

## 2017-08-08 ENCOUNTER — Other Ambulatory Visit: Payer: Self-pay | Admitting: Family Medicine

## 2017-12-13 ENCOUNTER — Other Ambulatory Visit: Payer: Self-pay | Admitting: Family Medicine

## 2017-12-23 ENCOUNTER — Telehealth: Payer: Self-pay | Admitting: Family Medicine

## 2017-12-23 DIAGNOSIS — E785 Hyperlipidemia, unspecified: Secondary | ICD-10-CM

## 2017-12-23 DIAGNOSIS — Z0000 Encounter for general adult medical examination without abnormal findings: Secondary | ICD-10-CM

## 2017-12-23 DIAGNOSIS — E119 Type 2 diabetes mellitus without complications: Secondary | ICD-10-CM

## 2017-12-23 DIAGNOSIS — E1169 Type 2 diabetes mellitus with other specified complication: Secondary | ICD-10-CM

## 2017-12-23 DIAGNOSIS — I10 Essential (primary) hypertension: Secondary | ICD-10-CM

## 2017-12-23 DIAGNOSIS — E669 Obesity, unspecified: Secondary | ICD-10-CM

## 2017-12-23 DIAGNOSIS — I152 Hypertension secondary to endocrine disorders: Secondary | ICD-10-CM

## 2017-12-23 NOTE — Telephone Encounter (Signed)
Copied from [place] [id number]. Topic: Quick Communication - Rx Refill/Question >> Dec 23, 2017  1:05 PM Neva Seat wrote: losartan-hydrochlorothiazide (HYZAAR) 100-25 MG tablet amLODipine (NORVASC) 5 MG tablet carvedilol (COREG) 12.5 MG tableT  Needing refills  [place], [place] [address] [place] [address] Phone: [phone] Fax: [phone]

## 2017-12-24 MED ORDER — AMLODIPINE BESYLATE 5 MG PO TABS: 5.0000 mg | ORAL_TABLET | Freq: Every day | ORAL | 3 refills | Status: DC

## 2017-12-24 MED ORDER — CARVEDILOL 12.5 MG PO TABS: 12.5000 mg | ORAL_TABLET | Freq: Two times a day (BID) | ORAL | 3 refills | Status: DC

## 2017-12-24 MED ORDER — LOSARTAN POTASSIUM-HCTZ 100-25 MG PO TABS: 1.0000 | ORAL_TABLET | Freq: Every day | ORAL | 3 refills | Status: DC

## 2017-12-24 NOTE — Telephone Encounter (Signed)
Losartan-HCTZ refill Last OV: 07/18/17 Last Refill:01/10/17 #90 tab 3 RF Pharmacy:[facility] PCP: Penni Homans MD  amlodipine refill Last OV: 07/18/17 Last Refill:01/10/17 #90 3 RF   carvedilol refill Last OV: 07/18/17 Last Refill:01/10/17 #180 tab 3 RF

## 2018-01-23 ENCOUNTER — Encounter: Payer: Self-pay | Admitting: Family Medicine

## 2018-01-23 ENCOUNTER — Ambulatory Visit (INDEPENDENT_AMBULATORY_CARE_PROVIDER_SITE_OTHER): Payer: 59 | Admitting: Family Medicine

## 2018-01-23 VITALS — BP 130/88 | HR 66 | Temp 98.1°F | Resp 18 | Ht 75.0 in | Wt 288.6 lb

## 2018-01-23 DIAGNOSIS — I10 Essential (primary) hypertension: Secondary | ICD-10-CM

## 2018-01-23 DIAGNOSIS — L600 Ingrowing nail: Secondary | ICD-10-CM

## 2018-01-23 DIAGNOSIS — E785 Hyperlipidemia, unspecified: Secondary | ICD-10-CM | POA: Diagnosis not present

## 2018-01-23 DIAGNOSIS — E119 Type 2 diabetes mellitus without complications: Secondary | ICD-10-CM | POA: Diagnosis not present

## 2018-01-23 DIAGNOSIS — K625 Hemorrhage of anus and rectum: Secondary | ICD-10-CM | POA: Insufficient documentation

## 2018-01-23 DIAGNOSIS — Z0000 Encounter for general adult medical examination without abnormal findings: Secondary | ICD-10-CM | POA: Diagnosis not present

## 2018-01-23 DIAGNOSIS — M2550 Pain in unspecified joint: Secondary | ICD-10-CM

## 2018-01-23 DIAGNOSIS — E669 Obesity, unspecified: Secondary | ICD-10-CM

## 2018-01-23 DIAGNOSIS — R1013 Epigastric pain: Secondary | ICD-10-CM | POA: Diagnosis not present

## 2018-01-23 DIAGNOSIS — K219 Gastro-esophageal reflux disease without esophagitis: Secondary | ICD-10-CM

## 2018-01-23 LAB — COMPREHENSIVE METABOLIC PANEL
ALT: 30 U/L (ref 0–53)
AST: 20 U/L (ref 0–37)
Albumin: 4.3 g/dL (ref 3.5–5.2)
Alkaline Phosphatase: 53 U/L (ref 39–117)
BUN: 13 mg/dL (ref 6–23)
CO2: 28 mEq/L (ref 19–32)
Calcium: 9.5 mg/dL (ref 8.4–10.5)
Chloride: 102 mEq/L (ref 96–112)
Creatinine, Ser: 1.12 mg/dL (ref 0.40–1.50)
GFR: 74.09 mL/min (ref >60.00)
Glucose, Bld: 156 mg/dL — ABNORMAL HIGH (ref 70–99)
Potassium: 4 mEq/L (ref 3.5–5.1)
Sodium: 139 mEq/L (ref 135–145)
Total Bilirubin: 0.7 mg/dL (ref 0.2–1.2)
Total Protein: 6.8 g/dL (ref 6.0–8.3)

## 2018-01-23 LAB — LIPID PANEL
Cholesterol: 152 mg/dL (ref 0–200)
HDL: 37 mg/dL — ABNORMAL LOW (ref >39.00)
LDL Cholesterol: 91 mg/dL (ref 0–99)
NonHDL: 114.55
Total CHOL/HDL Ratio: 4
Triglycerides: 117 mg/dL (ref 0.0–149.0)
VLDL: 23.4 mg/dL (ref 0.0–40.0)

## 2018-01-23 LAB — CBC
HCT: 44.8 % (ref 39.0–52.0)
Hemoglobin: 15.5 g/dL (ref 13.0–17.0)
MCHC: 34.5 g/dL (ref 30.0–36.0)
MCV: 87.8 fl (ref 78.0–100.0)
Platelets: 202 10*3/uL (ref 150.0–400.0)
RBC: 5.1 Mil/uL (ref 4.22–5.81)
RDW: 14.2 % (ref 11.5–15.5)
WBC: 7.3 10*3/uL (ref 4.0–10.5)

## 2018-01-23 LAB — H. PYLORI ANTIBODY, IGG: H Pylori IgG: NEGATIVE

## 2018-01-23 LAB — HEMOGLOBIN A1C: Hgb A1c MFr Bld: 7.4 % — ABNORMAL HIGH (ref 4.6–6.5)

## 2018-01-23 LAB — TSH: TSH: 0.7 u[IU]/mL (ref 0.35–4.50)

## 2018-01-23 MED ORDER — CYCLOBENZAPRINE HCL 10 MG PO TABS: 10.0000 mg | ORAL_TABLET | Freq: Two times a day (BID) | ORAL | 1 refills | Status: DC | PRN

## 2018-01-23 MED ORDER — OMEPRAZOLE 40 MG PO CPDR: 40.0000 mg | DELAYED_RELEASE_CAPSULE | Freq: Every day | ORAL | 3 refills | Status: DC

## 2018-01-23 NOTE — Assessment & Plan Note (Addendum)
Intermittent spots of bright red blood are seen on tissue, referred to GI

## 2018-01-23 NOTE — Assessment & Plan Note (Signed)
R>L over years and painful and difficult to manage. Also with a plantar wart at base of left great toe referred to podiatry for definitive treatment.

## 2018-01-23 NOTE — Assessment & Plan Note (Addendum)
Omeprazole 20 mg daily is not fully managing his symptoms. Avoid offending foods, start probiotics. Do not eat large meals in late evening and consider raising head of bed. Will increase the Omeprazole to 40 mg daily and can use Zantac 150 mg prn if eats badly or symptoms develop. Referred to gastroenterology for further consideration

## 2018-01-23 NOTE — Patient Instructions (Addendum)
Zantac/Ranitidine 150 mg tabs as needed for heartburn  DASH or MIND or Satiety diet  Health Weight and Wellness  Preventive Care 40-64 Years, Male Preventive care refers to lifestyle choices and visits with your health care provider that can promote health and wellness. What does preventive care include?  A yearly physical exam. This is also called an annual well check.  Dental exams once or twice a year.  Routine eye exams. Ask your health care provider how often you should have your eyes checked.  Personal lifestyle choices, including: ? Daily care of your teeth and gums. ? Regular physical activity. ? Eating a healthy diet. ? Avoiding tobacco and drug use. ? Limiting alcohol use. ? Practicing safe sex. ? Taking low-dose aspirin every day starting at age 49. What happens during an annual well check? The services and screenings done by your health care provider during your annual well check will depend on your age, overall health, lifestyle risk factors, and family history of disease. Counseling Your health care provider may ask you questions about your:  Alcohol use.  Tobacco use.  Drug use.  Emotional well-being.  Home and relationship well-being.  Sexual activity.  Eating habits.  Work and work Statistician.  Screening You may have the following tests or measurements:  Height, weight, and BMI.  Blood pressure.  Lipid and cholesterol levels. These may be checked every 5 years, or more frequently if you are over 25 years old.  Skin check.  Lung cancer screening. You may have this screening every year starting at age 49 if you have a 30-pack-year history of smoking and currently smoke or have quit within the past 15 years.  Fecal occult blood test (FOBT) of the stool. You may have this test every year starting at age 49.  Flexible sigmoidoscopy or colonoscopy. You may have a sigmoidoscopy every 5 years or a colonoscopy every 10 years starting at age  49.  Prostate cancer screening. Recommendations will vary depending on your family history and other risks.  Hepatitis C blood test.  Hepatitis B blood test.  Sexually transmitted disease (STD) testing.  Diabetes screening. This is done by checking your blood sugar (glucose) after you have not eaten for a while (fasting). You may have this done every 1-3 years.  Discuss your test results, treatment options, and if necessary, the need for more tests with your health care provider. Vaccines Your health care provider may recommend certain vaccines, such as:  Influenza vaccine. This is recommended every year.  Tetanus, diphtheria, and acellular pertussis (Tdap, Td) vaccine. You may need a Td booster every 10 years.  Varicella vaccine. You may need this if you have not been vaccinated.  Zoster vaccine. You may need this after age 45.  Measles, mumps, and rubella (MMR) vaccine. You may need at least one dose of MMR if you were born in 1957 or later. You may also need a second dose.  Pneumococcal 13-valent conjugate (PCV13) vaccine. You may need this if you have certain conditions and have not been vaccinated.  Pneumococcal polysaccharide (PPSV23) vaccine. You may need one or two doses if you smoke cigarettes or if you have certain conditions.  Meningococcal vaccine. You may need this if you have certain conditions.  Hepatitis A vaccine. You may need this if you have certain conditions or if you travel or work in places where you may be exposed to hepatitis A.  Hepatitis B vaccine. You may need this if you have certain conditions or if  you travel or work in places where you may be exposed to hepatitis B.  Haemophilus influenzae type b (Hib) vaccine. You may need this if you have certain risk factors.  Talk to your health care provider about which screenings and vaccines you need and how often you need them. This information is not intended to replace advice given to you by your health  care provider. Make sure you discuss any questions you have with your health care provider. Document Released: 08/26/2015 Document Revised: 04/18/2016 Document Reviewed: 05/31/2015 Elsevier Interactive Patient Education  [employer].

## 2018-01-23 NOTE — Assessment & Plan Note (Signed)
Stiffness and pain in the legs and feet daily better after he moves around. 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

## 2018-01-23 NOTE — Progress Notes (Signed)
Subjective:  I acted as a Education administrator for Dr. Charlett Blake. [place], [place]  Patient ID: Robert [name], male    DOB: [birth year], 47 y.o.   MRN: [id number]  No chief complaint on file.   HPI  Patient is in today for an annual exam. He is following up on his HTN, DM, reflux and other medical concerns. No recent hospitalizations or febrile illness. He has continued to be under a great deal of stress with work at [employer], his 94 yo son just graduated and is headed to [employer] in the fall and his 49 yo and 49 yo are busy. Also his mother in law have been living with them and is adding to the stress in the family and more. He admits this has all come together to keep him from exercising. He has been eating well only at times. Doing well with ADLs. No polyuria or polydipsia.   Patient Care Team: [name], MD as PCP - General (Family Medicine)   Past Medical History:  Diagnosis Date  . Arthralgia 01/11/2013   Diffuse, b/l Hips R>L Knees, ankles, low back  . Back pain 12/08/2015  . Diabetes mellitus type 2 in obese ([place]) 11/09/2012  . Dyslipidemia 11/09/2012  . Esophageal reflux 12/08/2015  . History of migraine headaches   . Hyperglycemia 11/09/2012  . Hypertension     Past Surgical History:  Procedure Laterality Date  . NO PAST SURGERIES      Family History  Problem Relation Age of Onset  . Arthritis Mother   . Hypertension Mother   . Diabetes Mother   . Arthritis Father   . Hypertension Father   . Diabetes Father   . Sleep apnea Father   . Obesity Father   . Arthritis Maternal Grandmother   . Diabetes Maternal Grandmother   . Stroke Maternal Grandmother   . Arthritis Paternal Grandmother   . Diabetes Paternal Grandmother   . Cancer Paternal Grandmother        lung, smoker  . Diabetes Paternal Grandfather   . Heart disease Paternal Grandfather 61       MI  . Hyperlipidemia Brother   . Hypertension Brother   . Diabetes Brother   . COPD Maternal Grandfather   . Kidney disease  Neg Hx     Social History   Socioeconomic History  . Marital status: Married    Spouse name: Not on file  . Number of children: 4  . Years of education: Not on file  . Highest education level: Not on file  Occupational History  . Occupation: Software engineer: [employer]  . Financial resource strain: Not on file  . Food insecurity:    Worry: Not on file    Inability: Not on file  . Transportation needs:    Medical: Not on file    Non-medical: Not on file  Tobacco Use  . Smoking status: Former Smoker    Years: 9.00    Types: Cigarettes    Last attempt to quit: 08/14/1995    Years since quitting: 22.4  . Smokeless tobacco: Never Used  Substance and Sexual Activity  . Alcohol use: Yes    Alcohol/week: 3.6 - 6.0 oz    Types: 6 - 10 Cans of beer per week  . Drug use: No  . Sexual activity: Yes    Comment: lives with wife and daughter, no dietary restrictions  Lifestyle  . Physical activity:    Days  per week: Not on file    Minutes per session: Not on file  . Stress: Not on file  Relationships  . Social connections:    Talks on phone: Not on file    Gets together: Not on file    Attends religious service: Not on file    Active member of club or organization: Not on file    Attends meetings of clubs or organizations: Not on file    Relationship status: Not on file  . Intimate partner violence:    Fear of current or ex partner: Not on file    Emotionally abused: Not on file    Physically abused: Not on file    Forced sexual activity: Not on file  Other Topics Concern  . 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 amLODipine (NORVASC) 5 MG tablet Take 1  tablet (5 mg total) by mouth daily. 90 tablet 3  . carvedilol (COREG) 12.5 MG tablet Take 1 tablet (12.5 mg total) by mouth 2 (two) times daily with a meal. 180 tablet 3  . losartan-hydrochlorothiazide (HYZAAR) 100-25 MG tablet Take 1 tablet by mouth daily. 90 tablet 3  . metFORMIN (GLUCOPHAGE) 500 MG tablet TAKE ONE TABLET BY MOUTH DAILY WITH BREAKFAST 30 tablet 3  . omeprazole (PRILOSEC) 20 MG capsule Take 1 capsule (20 mg total) by mouth daily. 30 capsule 11  . cyclobenzaprine (FLEXERIL) 10 MG tablet Take 1 tablet (10 mg total) by mouth 2 (two) times daily as needed for muscle spasms. 40 tablet 1   No facility-administered medications prior to visit.     Allergies  Allergen Reactions  . Mucinex [Guaifenesin Er] Shortness Of Breath, Itching and Dermatitis    Swelling of hands and feet  . Nsaids     Aleve, Advil cause swelling, itching and rash  . Latex Rash    Review of Systems  Constitutional: Positive for malaise/fatigue. Negative for chills and fever.  HENT: Negative for congestion and hearing loss.   Eyes: Negative for discharge.  Respiratory: Negative for cough, sputum production and shortness of breath.   Cardiovascular: Negative for chest pain, palpitations and leg swelling.  Gastrointestinal: Negative for abdominal pain, blood in stool, constipation, diarrhea, heartburn, nausea and vomiting.  Genitourinary: Negative for dysuria, frequency, hematuria and urgency.  Musculoskeletal: Positive for joint pain. Negative for back pain, falls and myalgias.  Skin: Negative for rash.       Wart on plantar surface of left great toe  Neurological: Negative for dizziness, sensory change, loss of consciousness, weakness and headaches.  Endo/Heme/Allergies: Negative for environmental allergies. Does not bruise/bleed easily.  Psychiatric/Behavioral: Negative for depression and suicidal ideas. The patient is nervous/anxious. The patient does not have insomnia.        Objective:      Physical Exam  Constitutional: He is oriented to person, place, and time. He appears well-developed and well-nourished. No distress.  HENT:  Head: Normocephalic and atraumatic.  Right Ear: External ear normal.  Left Ear: External ear normal.  Nose: Nose normal.  Mouth/Throat: Oropharynx is clear and moist.  Eyes: Pupils are equal, round, and reactive to light. EOM are normal. Right eye exhibits no discharge. Left eye exhibits no discharge.  Neck: Normal range of motion. Neck supple. No thyromegaly present.  Cardiovascular: Normal rate, regular rhythm and normal heart sounds.  No murmur heard. Pulmonary/Chest: Effort normal and breath sounds normal. He has no wheezes.  Abdominal: Soft. Bowel sounds are normal. He exhibits no distension and no mass. There is no tenderness. There is no rebound.  Musculoskeletal: Normal range of motion. He exhibits no edema.  Lymphadenopathy:    He has no cervical adenopathy.  Neurological: He is alert and oriented to person, place, and time. No cranial nerve deficit. He exhibits normal muscle tone. Coordination normal.  Skin: Skin is warm and dry.  Plantar wart at base of left great toe. Skin disruption at edges of both great toenails.no redness, warmth or discharge  Psychiatric: He has a normal mood and affect.  Nursing note and vitals reviewed.   BP 130/88 (BP Location: Left Arm, Patient Position: Sitting, Cuff Size: Normal)   Pulse 66   Temp 98.1 F (36.7 C) (Oral)   Resp 18   Ht 6\' 3"  (1.905 m)   Wt 288 lb 9.6 oz (130.9 kg)   SpO2 96%   BMI 36.07 kg/m  Wt Readings from Last 3 Encounters:  01/23/18 288 lb 9.6 oz (130.9 kg)  07/18/17 288 lb (130.6 kg)  12/11/16 281 lb 3.2 oz (127.6 kg)   BP Readings from Last 3 Encounters:  01/23/18 130/88  07/18/17 (!) 154/90  01/15/17 124/76     Immunization History  Administered Date(s) Administered  . Td 08/13/2010    Health Maintenance  Topic Date Due  . PNEUMOCOCCAL POLYSACCHARIDE VACCINE  (1) 02/22/1971  . OPHTHALMOLOGY EXAM  02/22/1979  . HIV Screening  02/22/1984  . FOOT EXAM  12/11/2017  . HEMOGLOBIN A1C  01/16/2018  . INFLUENZA VACCINE  03/20/2018 (Originally 03/13/2018)  . TETANUS/TDAP  08/13/2020    Lab Results  Component Value Date   WBC 7.0 07/18/2017   HGB 15.8 07/18/2017   HCT 47.4 07/18/2017   PLT 213.0 07/18/2017   GLUCOSE 131 (H) 07/18/2017   CHOL 152 07/18/2017   TRIG 134.0 07/18/2017   HDL 36.50 (L) 07/18/2017   LDLCALC 89 07/18/2017   ALT 37 07/18/2017   AST 46 (H) 07/18/2017   NA 140 07/18/2017   K 3.7 07/18/2017   CL 105 07/18/2017   CREATININE 1.04 07/18/2017   BUN 14 07/18/2017   CO2 29 07/18/2017   TSH 0.75 07/18/2017   HGBA1C 6.8 (H) 07/18/2017    Lab Results  Component Value Date   TSH 0.75 07/18/2017   Lab Results  Component Value Date   WBC 7.0 07/18/2017   HGB 15.8 07/18/2017   HCT 47.4 07/18/2017   MCV 90.4 07/18/2017   PLT 213.0 07/18/2017   Lab Results  Component Value Date   NA 140 07/18/2017   K 3.7 07/18/2017   CO2 29 07/18/2017   GLUCOSE 131 (H) 07/18/2017   BUN 14 07/18/2017   CREATININE 1.04 07/18/2017   BILITOT 0.7 07/18/2017   ALKPHOS 47 07/18/2017   AST 46 (H) 07/18/2017   ALT 37 07/18/2017   PROT 6.7 07/18/2017   ALBUMIN 4.4 07/18/2017   CALCIUM 9.1 07/18/2017   GFR 80.88 07/18/2017   Lab Results  Component Value Date   CHOL 152 07/18/2017   Lab Results  Component Value Date   HDL 36.50 (L) 07/18/2017  Lab Results  Component Value Date   LDLCALC 89 07/18/2017   Lab Results  Component Value Date   TRIG 134.0 07/18/2017   Lab Results  Component Value Date   CHOLHDL 4 07/18/2017   Lab Results  Component Value Date   HGBA1C 6.8 (H) 07/18/2017         Assessment & Plan:   Problem List Items Addressed This Visit    HTN (hypertension) - Primary    Well controlled, no changes to meds. Encouraged heart healthy diet such as the DASH diet and exercise as tolerated.       Relevant  Orders   CBC   Comprehensive metabolic panel   TSH   Annual physical exam    Patient encouraged to maintain heart healthy diet, regular exercise, adequate sleep. Consider daily probiotics. Take medications as prescribed. Has been unable to take care of himself due to work and family stress and is not exercising. Eating better but not great. Encouraged DASH or MIND diet.       Dyslipidemia    Encouraged heart healthy diet, increase exercise, avoid trans fats, consider a krill oil cap daily      Relevant Orders   Lipid panel   Arthralgia    Stiffness and pain in the legs and feet daily better after he moves around. Likely early OA,       Obesity    Encouraged DASH diet, decrease po intake and increase exercise as tolerated. Needs 7-8 hours of sleep nightly. Avoid trans fats, eat small, frequent meals every 4-5 hours with lean proteins, complex carbs and healthy fats. Minimize simple carbs      Esophageal reflux    Omeprazole 20 mg daily is not fully managing his symptoms. Avoid offending foods, start probiotics. Do not eat large meals in late evening and consider raising head of bed. Will increase the Omeprazole to 40 mg daily and can use Zantac 150 mg prn if eats badly or symptoms develop. Referred to gastroenterology for further consideration      Relevant Medications   omeprazole (PRILOSEC) 40 MG capsule   Other Relevant Orders   Ambulatory referral to Gastroenterology   Ingrown toenail    R>L over years and painful and difficult to manage. Also with a plantar wart at base of left great toe referred to podiatry for definitive treatment.      Relevant Orders   Ambulatory referral to Podiatry   Rectal bleeding    Intermittent spots of bright red blood are seen on tissue, referred to GI       Other Visit Diagnoses    Diabetes mellitus without complication ([place])       Relevant Orders   Hemoglobin A1c   Epigastric pain       Relevant Orders   Ambulatory referral to  Gastroenterology   H. pylori antibody, IgG      I have discontinued Izeah Sawaya's omeprazole. I am also having him start on omeprazole. Additionally, I am having him maintain his metFORMIN, losartan-hydrochlorothiazide, amLODipine, carvedilol, and cyclobenzaprine.  Meds ordered this encounter  Medications  . cyclobenzaprine (FLEXERIL) 10 MG tablet    Sig: Take 1 tablet (10 mg total) by mouth 2 (two) times daily as needed for muscle spasms.    Dispense:  40 tablet    Refill:  1  . omeprazole (PRILOSEC) 40 MG capsule    Sig: Take 1 capsule (40 mg total) by mouth daily.    Dispense:  30 capsule  Refill:  3   Patient seen with and examined with student.  Agree with documentation See separate note for further documentation Penni Homans, MD

## 2018-01-23 NOTE — Assessment & Plan Note (Signed)
Patient encouraged to maintain heart healthy diet, regular exercise, adequate sleep. Consider daily probiotics. Take medications as prescribed. Has been unable to take care of himself due to work and family stress and is not exercising. Eating better but not great. Encouraged DASH or MIND diet.

## 2018-01-23 NOTE — Assessment & Plan Note (Signed)
Encouraged heart healthy diet, increase exercise, avoid trans fats, consider a krill oil cap daily 

## 2018-01-23 NOTE — Assessment & Plan Note (Signed)
Encouraged DASH diet, decrease po intake and increase exercise as tolerated. Needs 7-8 hours of sleep nightly. Avoid trans fats, eat small, frequent meals every 4-5 hours with lean proteins, complex carbs and healthy fats. Minimize simple carbs 

## 2018-01-23 NOTE — Assessment & Plan Note (Signed)
Well controlled, no changes to meds. Encouraged heart healthy diet such as the DASH diet and exercise as tolerated.  °

## 2018-03-10 ENCOUNTER — Encounter: Payer: Self-pay | Admitting: Family Medicine

## 2018-03-31 ENCOUNTER — Telehealth: Payer: Self-pay | Admitting: Family Medicine

## 2018-03-31 DIAGNOSIS — Z0000 Encounter for general adult medical examination without abnormal findings: Secondary | ICD-10-CM

## 2018-03-31 DIAGNOSIS — E785 Hyperlipidemia, unspecified: Secondary | ICD-10-CM

## 2018-03-31 DIAGNOSIS — I10 Essential (primary) hypertension: Secondary | ICD-10-CM

## 2018-03-31 DIAGNOSIS — E669 Obesity, unspecified: Secondary | ICD-10-CM

## 2018-03-31 DIAGNOSIS — E1169 Type 2 diabetes mellitus with other specified complication: Secondary | ICD-10-CM

## 2018-03-31 DIAGNOSIS — I152 Hypertension secondary to endocrine disorders: Secondary | ICD-10-CM

## 2018-03-31 MED ORDER — CARVEDILOL 12.5 MG PO TABS: 12.5000 mg | ORAL_TABLET | Freq: Two times a day (BID) | ORAL | 1 refills | Status: DC

## 2018-03-31 MED ORDER — AMLODIPINE BESYLATE 5 MG PO TABS: 5.0000 mg | ORAL_TABLET | Freq: Every day | ORAL | 1 refills | Status: DC

## 2018-03-31 MED ORDER — METFORMIN HCL 500 MG PO TABS: 500.0000 mg | ORAL_TABLET | Freq: Every day | ORAL | 3 refills | Status: DC

## 2018-03-31 MED ORDER — LOSARTAN POTASSIUM-HCTZ 100-25 MG PO TABS: 1.0000 | ORAL_TABLET | Freq: Every day | ORAL | 1 refills | Status: DC

## 2018-03-31 NOTE — Telephone Encounter (Signed)
Copied from [place] [id number]. Topic: Quick Communication - Rx Refill/Question >> Mar 31, 2018  1:59 PM Bea Graff, NT wrote: Medication: carvedilol (COREG) 12.5 MG tablet, amLODipine (NORVASC) 5 MG tablet, losartan-hydrochlorothiazide (HYZAAR) 100-25 MG tablet, metFORMIN (GLUCOPHAGE) 500 MG tablet  Has the patient contacted their pharmacy? Yes.   (Agent: If no, request that the patient contact the pharmacy for the refill.) (Agent: If yes, when and what did the pharmacy advise?)  Preferred Pharmacy (with phone number or street name): [facility], [place]. [phone] (Phone) [phone] (Fax)    Agent: Please be advised that RX refills may take up to 3 business days. We ask that you follow-up with your pharmacy.

## 2018-04-03 NOTE — Telephone Encounter (Signed)
losartan-hydrochlorothiazide (HYZAAR) 100-25 MG tablet [[id number]]  Pt called and stated that he currently does not have insurance. He states that [facility] rx and it was $96 because it is a combo pill. He would like to know if he can have the plain losartan or a cheaper rx please advise Cb# [phone]

## 2018-04-04 MED ORDER — LOSARTAN POTASSIUM 100 MG PO TABS: 100.0000 mg | ORAL_TABLET | Freq: Every day | ORAL | 2 refills | Status: DC

## 2018-04-04 MED ORDER — HYDROCHLOROTHIAZIDE 25 MG PO TABS: 25.0000 mg | ORAL_TABLET | Freq: Every day | ORAL | 2 refills | Status: DC

## 2018-04-04 NOTE — Telephone Encounter (Signed)
Can split to Losartan 100 mg po qd disp #30 with 2 rf and HCTZ 25 mg po qd disp #30 with 2 rf also tell them to check Covermymeds and Goodrx because they will tell you the cheapest cost pay price in town

## 2018-04-04 NOTE — Addendum Note (Signed)
Addended by: Kelle Darting A on: 04/04/2018 03:28 PM   Modules accepted: Orders

## 2018-04-04 NOTE — Telephone Encounter (Signed)
Please advise 

## 2018-04-04 NOTE — Telephone Encounter (Signed)
Notified pt and he voices understanding. Rxs sent to [facility].

## 2018-06-27 ENCOUNTER — Telehealth: Payer: Self-pay | Admitting: Family Medicine

## 2018-06-27 DIAGNOSIS — E1169 Type 2 diabetes mellitus with other specified complication: Secondary | ICD-10-CM

## 2018-06-27 DIAGNOSIS — Z3009 Encounter for other general counseling and advice on contraception: Secondary | ICD-10-CM

## 2018-06-27 DIAGNOSIS — Z0000 Encounter for general adult medical examination without abnormal findings: Secondary | ICD-10-CM

## 2018-06-27 DIAGNOSIS — E785 Hyperlipidemia, unspecified: Secondary | ICD-10-CM

## 2018-06-27 DIAGNOSIS — E669 Obesity, unspecified: Secondary | ICD-10-CM

## 2018-06-27 DIAGNOSIS — I10 Essential (primary) hypertension: Secondary | ICD-10-CM

## 2018-06-27 DIAGNOSIS — I152 Hypertension secondary to endocrine disorders: Secondary | ICD-10-CM

## 2018-06-27 NOTE — Telephone Encounter (Signed)
Copied from [place] [id number]. Topic: Quick Communication - See Telephone Encounter >> Jun 27, 2018  4:30 PM Blase Mess A wrote: CRM for notification. See Telephone encounter for: 06/27/18. Patient is calling to request a refferal for a vasectomy Please advise [phone]

## 2018-06-27 NOTE — Telephone Encounter (Signed)
Copied from [place] [id number]. Topic: Quick Communication - Rx Refill/Question >> Jun 27, 2018  4:27 PM Blase Mess A wrote: Medication: losartan (COZAAR) 100 MG tablet [[id number]] , omeprazole (PRILOSEC) 40 MG capsule [[id number]]   Has the patient contacted their pharmacy? Yes  (Agent: If no, request that the patient contact the pharmacy for the refill.) (Agent: If yes, when and what did the pharmacy advise?)  Preferred Pharmacy (with phone number or street name): [facility] [address] [place] [address] [phone]  Agent: Please be advised that RX refills may take up to 3 business days. We ask that you follow-up with your pharmacy.

## 2018-06-27 NOTE — Telephone Encounter (Signed)
Referral placed.

## 2018-07-01 MED ORDER — FAMOTIDINE 40 MG PO TABS: 40.0000 mg | ORAL_TABLET | Freq: Every day | ORAL | 5 refills | Status: DC

## 2018-07-01 MED ORDER — LOSARTAN POTASSIUM 100 MG PO TABS
100.0000 mg | ORAL_TABLET | Freq: Every day | ORAL | 2 refills | Status: DC
Start: 2018-07-01 — End: 2018-10-24

## 2018-07-01 MED FILL — LOSARTAN POTASSIUM 100 MG T: 100 | 30 days supply | Qty: 30 | Fill #0

## 2018-07-01 NOTE — Telephone Encounter (Signed)
Medications sent in.

## 2018-07-17 ENCOUNTER — Other Ambulatory Visit: Payer: Self-pay | Admitting: Family Medicine

## 2018-07-17 DIAGNOSIS — E669 Obesity, unspecified: Secondary | ICD-10-CM

## 2018-07-17 DIAGNOSIS — E785 Hyperlipidemia, unspecified: Secondary | ICD-10-CM

## 2018-07-17 DIAGNOSIS — E1169 Type 2 diabetes mellitus with other specified complication: Secondary | ICD-10-CM

## 2018-07-17 DIAGNOSIS — I152 Hypertension secondary to endocrine disorders: Secondary | ICD-10-CM

## 2018-07-17 DIAGNOSIS — I10 Essential (primary) hypertension: Secondary | ICD-10-CM

## 2018-07-17 DIAGNOSIS — Z0000 Encounter for general adult medical examination without abnormal findings: Secondary | ICD-10-CM

## 2018-07-17 MED ORDER — AMLODIPINE BESYLATE 5 MG PO TABS: 5.0000 mg | ORAL_TABLET | Freq: Every day | ORAL | 0 refills | Status: DC

## 2018-07-17 MED ORDER — CARVEDILOL 12.5 MG PO TABS: 12.5000 mg | ORAL_TABLET | Freq: Two times a day (BID) | ORAL | 0 refills | Status: DC

## 2018-07-17 MED FILL — CARVEDILOL 12.5 MG TABLET: 12.5 | 30 days supply | Qty: 60 | Fill #0

## 2018-07-17 NOTE — Telephone Encounter (Signed)
Copied from [place] [id number]. Topic: Quick Communication - Rx Refill/Question >> Jul 17, 2018 10:23 AM Oneta Rack wrote: Medication: amLODipine (NORVASC) 5 MG tablet, carvedilol (COREG) 12.5 MG tablet, metFORMIN (GLUCOPHAGE) 500 MG tablet   Has the patient contacted their pharmacy? Yes   (Agent: If yes, when and what did the pharmacy advise?) new pharmacy   Preferred Pharmacy (with phone number or street name):  [place], [place] [phone] (Phone) [phone] (Fax)   Agent: Please be advised that RX refills may take up to 3 business days. We ask that you follow-up with your pharmacy.

## 2018-07-18 ENCOUNTER — Other Ambulatory Visit: Payer: Self-pay | Admitting: Family Medicine

## 2018-07-18 MED ORDER — METFORMIN HCL 500 MG PO TABS: 500.0000 mg | ORAL_TABLET | Freq: Every day | ORAL | 0 refills | Status: DC

## 2018-07-18 MED FILL — metFORMIN HCL 500 MG TABS: 500 | 90 days supply | Qty: 90 | Fill #0

## 2018-07-18 NOTE — Telephone Encounter (Signed)
Requested Prescriptions  Pending Prescriptions Disp Refills  . metFORMIN (GLUCOPHAGE) 500 MG tablet 90 tablet 0    Sig: Take 1 tablet (500 mg total) by mouth daily with breakfast.     Endocrinology:  Diabetes - Biguanides Passed - 07/18/2018 10:23 AM      Passed - Cr in normal range and within 360 days    Creat  Date Value Ref Range Status  01/20/2014 1.04 0.50 - 1.35 mg/dL Final   Creatinine, Ser  Date Value Ref Range Status  01/23/2018 1.12 0.40 - 1.50 mg/dL Final         Passed - HBA1C is between 0 and 7.9 and within 180 days    Hgb A1c MFr Bld  Date Value Ref Range Status  01/23/2018 7.4 (H) 4.6 - 6.5 % Final    Comment:    Glycemic Control Guidelines for People with Diabetes:Non Diabetic:  <6%Goal of Therapy: <7%Additional Action Suggested:  >8%          Passed - eGFR in normal range and within 360 days    GFR  Date Value Ref Range Status  01/23/2018 74.09 >60.00 mL/min Final         Passed - Valid encounter within last 6 months    Recent Outpatient Visits          5 months ago Annual physical exam   [facility], Bonnita Levan, MD   1 year ago Essential hypertension   [facility] at [place], MD   1 year ago Essential hypertension   [facility] at [place], MD   1 year ago Annual physical exam   [facility] at [place], MD   2 years ago Gastroesophageal reflux disease, esophagitis presence not specified   [facility] at [place], Bonnita Levan, MD

## 2018-07-18 NOTE — Telephone Encounter (Signed)
Copied from [place] [phone] [phone]. Topic: Quick Communication - Rx Refill/Question >> Jul 18, 2018 10:11 AM Judyann Munson wrote: Medication: metFORMIN (GLUCOPHAGE) 500 MG tablet     Has the patient contacted their pharmacy? Yes   Preferred Pharmacy (with phone number or street name): [place], [place] - [place] [phone] (Phone) [phone] (Fax)    Agent: Please be advised that RX refills may take up to 3 business days. We ask that you follow-up with your pharmacy.

## 2018-07-24 ENCOUNTER — Other Ambulatory Visit: Payer: Self-pay | Admitting: Family Medicine

## 2018-07-25 ENCOUNTER — Ambulatory Visit: Payer: 59 | Admitting: Family Medicine

## 2018-08-01 MED FILL — LOSARTAN POTASSIUM 100 MG T: 100 | 30 days supply | Qty: 30 | Fill #1

## 2018-08-01 MED FILL — FAMOTIDINE 40 MG TABS: 40 | 30 days supply | Qty: 30 | Fill #0

## 2018-08-21 ENCOUNTER — Telehealth: Payer: Self-pay | Admitting: Family Medicine

## 2018-08-21 ENCOUNTER — Other Ambulatory Visit: Payer: Self-pay | Admitting: Family Medicine

## 2018-08-21 DIAGNOSIS — E669 Obesity, unspecified: Secondary | ICD-10-CM

## 2018-08-21 DIAGNOSIS — E785 Hyperlipidemia, unspecified: Secondary | ICD-10-CM

## 2018-08-21 DIAGNOSIS — I152 Hypertension secondary to endocrine disorders: Secondary | ICD-10-CM

## 2018-08-21 DIAGNOSIS — Z0000 Encounter for general adult medical examination without abnormal findings: Secondary | ICD-10-CM

## 2018-08-21 DIAGNOSIS — E1169 Type 2 diabetes mellitus with other specified complication: Secondary | ICD-10-CM

## 2018-08-21 DIAGNOSIS — I10 Essential (primary) hypertension: Secondary | ICD-10-CM

## 2018-08-21 MED ORDER — CARVEDILOL 12.5 MG PO TABS: 12.5000 mg | ORAL_TABLET | Freq: Two times a day (BID) | ORAL | 0 refills | Status: DC

## 2018-08-21 MED FILL — CARVEDILOL 12.5 MG TABLET: 12.5 | 30 days supply | Qty: 60 | Fill #0

## 2018-08-21 NOTE — Telephone Encounter (Signed)
Copied from [place] [id number]. Topic: Quick Communication - Rx Refill/Question >> Aug 21, 2018  1:21 PM Reyne Dumas L wrote: Medication:  famotidine (PEPCID) 40 MG tablet  Pt states that he is unsure why he was changed to this medication. Pt states that in the past he has taken Omeprazole 40mg  and that was working well for him.  Pt states that the famotidine isn't working as well and he would like to be switched back.  Has the patient contacted their pharmacy? No - would like new medication (Agent: If no, request that the patient contact the pharmacy for the refill.) (Agent: If yes, when and what did the pharmacy advise?)  Preferred Pharmacy (with phone number or street name): [place], [place] - [place] [phone] (Phone) [phone] (Fax)  Agent: Please be advised that RX refills may take up to 3 business days. We ask that you follow-up with your pharmacy.

## 2018-08-22 MED ORDER — OMEPRAZOLE 40 MG PO CPDR: 40.0000 mg | DELAYED_RELEASE_CAPSULE | Freq: Every day | ORAL | 3 refills | Status: DC

## 2018-08-22 MED FILL — OMEPRAZOLE 40 MG CPDR: 40 | 90 days supply | Qty: 90 | Fill #0

## 2018-08-22 NOTE — Telephone Encounter (Signed)
Resent medication in

## 2018-08-23 ENCOUNTER — Encounter: Payer: Self-pay | Admitting: Family Medicine

## 2018-08-25 ENCOUNTER — Other Ambulatory Visit: Payer: Self-pay | Admitting: Family Medicine

## 2018-09-05 MED FILL — LOSARTAN POTASSIUM 100 MG T: 100 | 30 days supply | Qty: 30 | Fill #2

## 2018-09-11 IMAGING — DX DG NECK SOFT TISSUE
2 series · 2 of 2 positions shown · non-contrast
Comparison: No prior .

CLINICAL DATA: Bilateral arm numbness.  Tingling.

EXAM:
NECK SOFT TISSUES - 1+ VIEW

[neck ap]
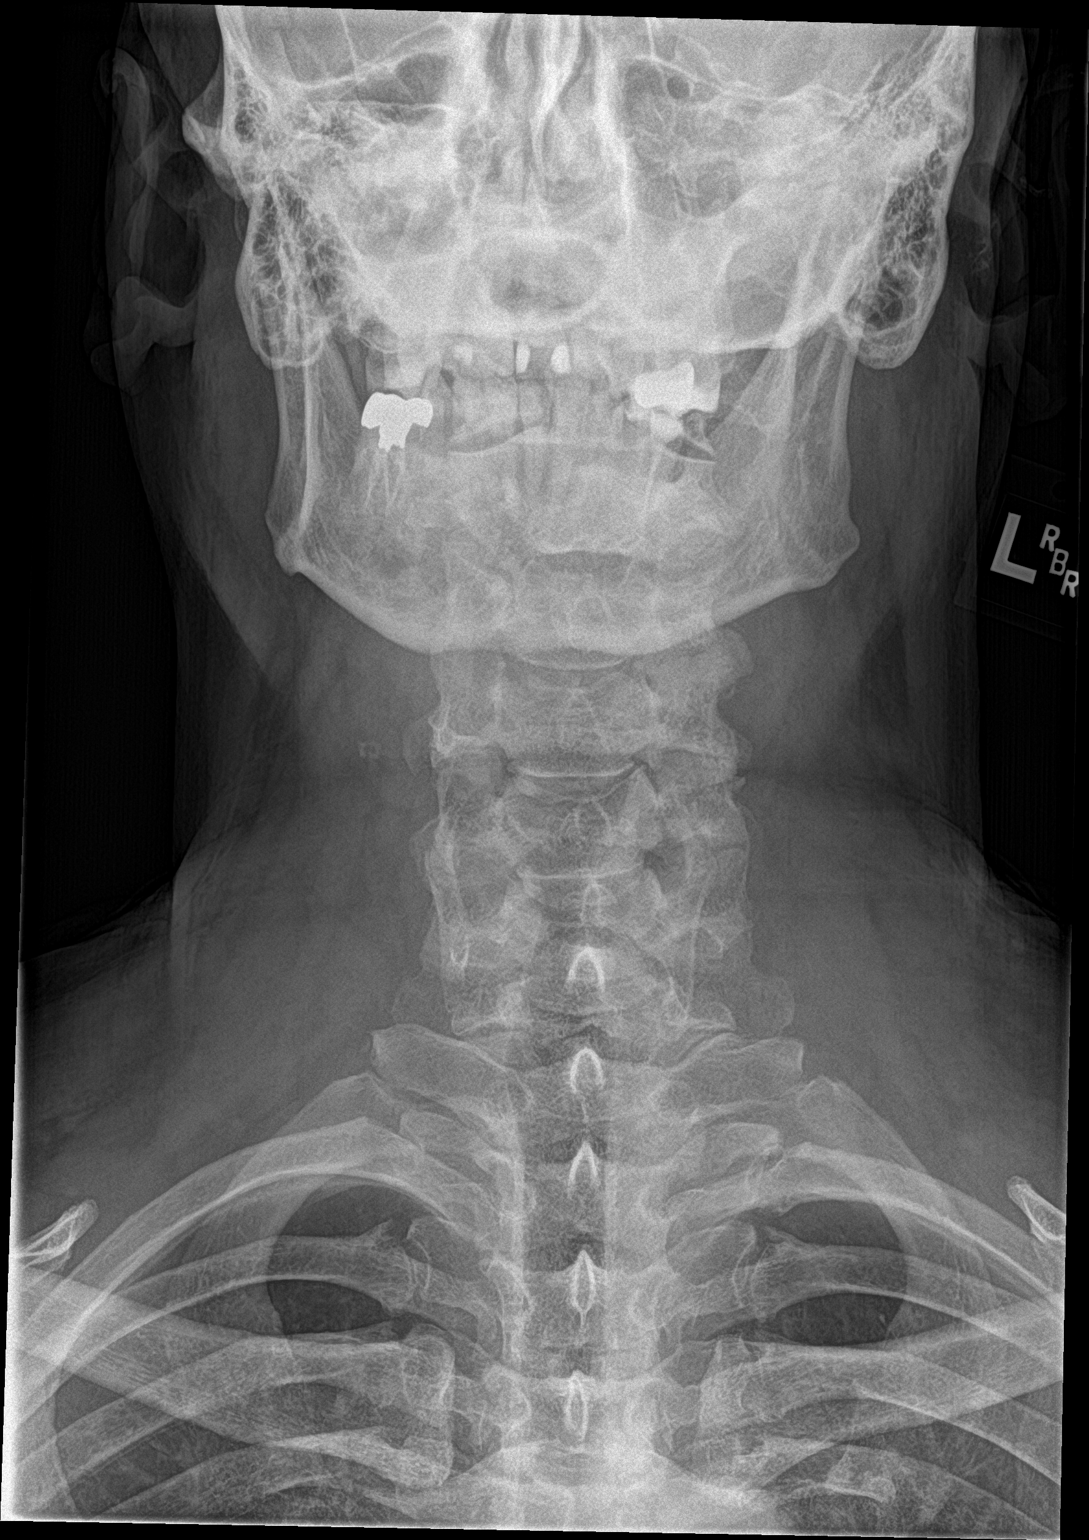

[neck lat]
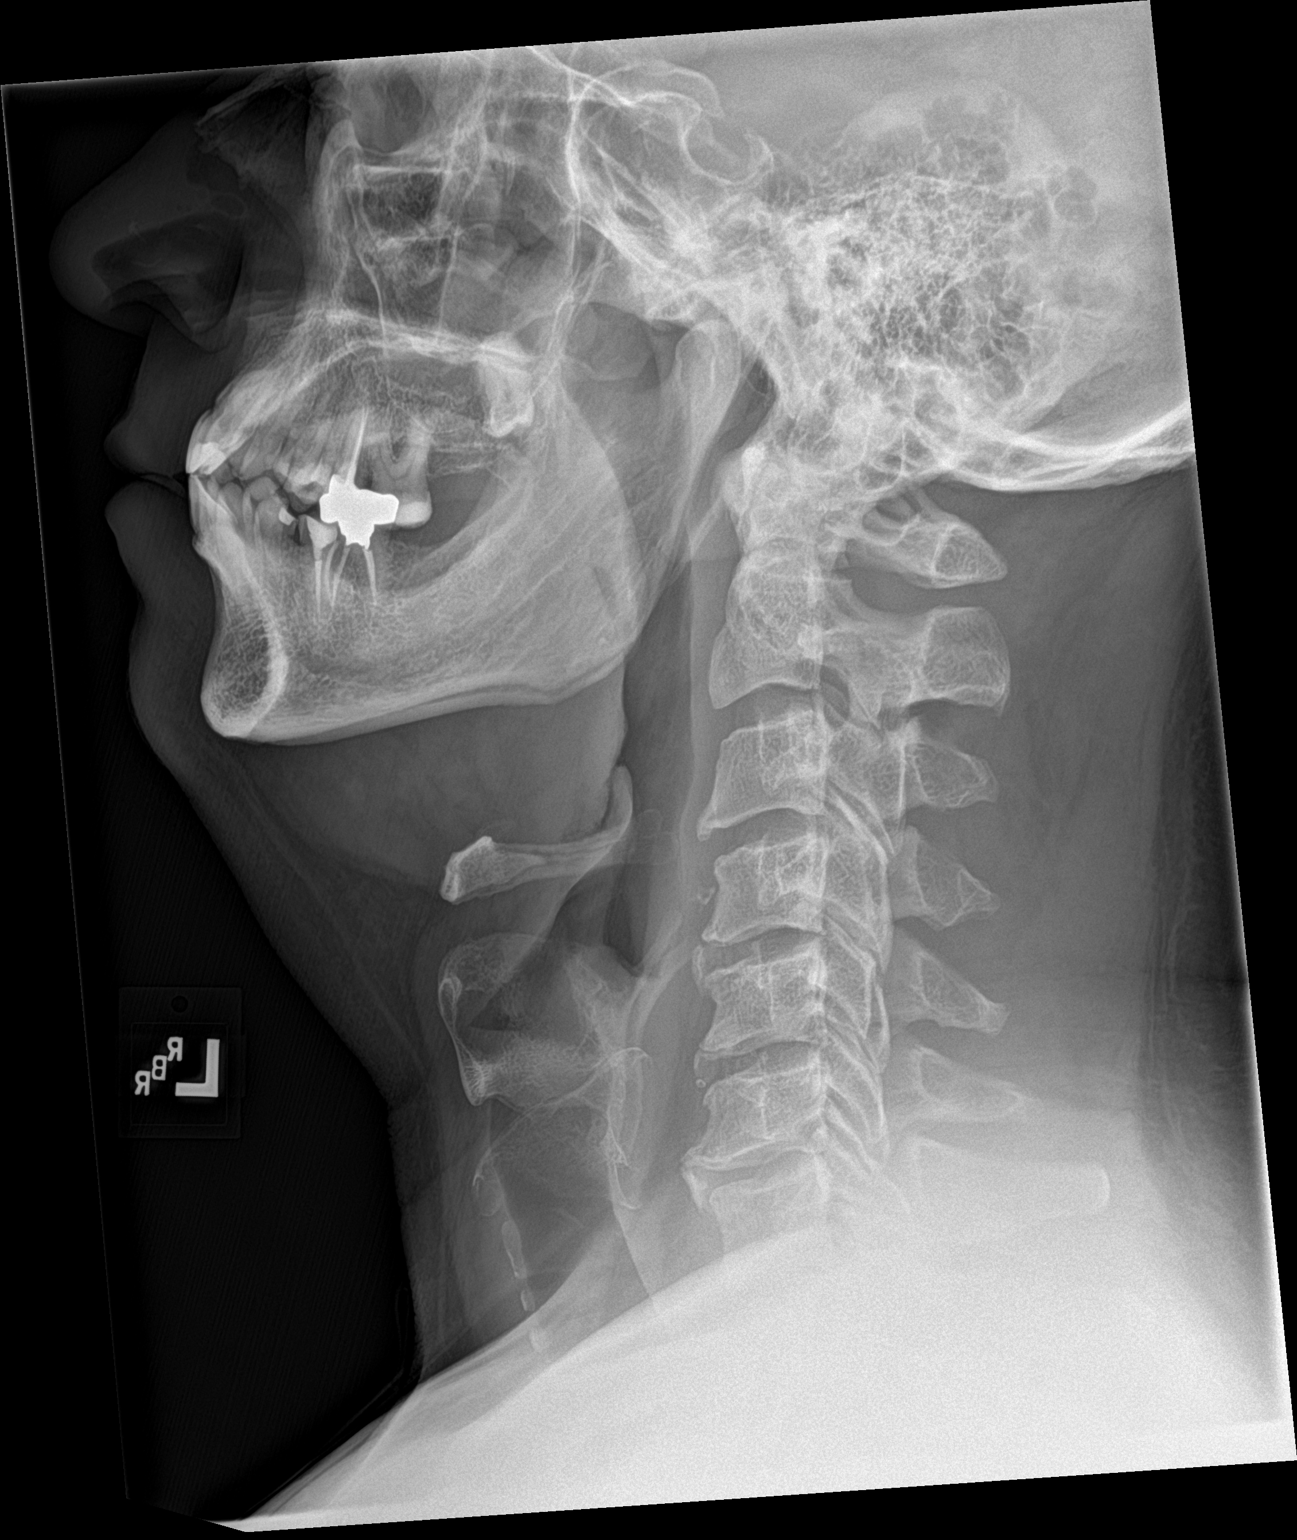

[2 of 2 positions shown; findings below may reference images not displayed]

FINDINGS: Bilateral carotid vascular calcification. Soft tissues of the
cervical spine are otherwise unremarkable. Epiglottis appears
normal. Retropharyngeal space appears normal. Cervical airway widely
patent. Diffuse multilevel degenerative change cervical spine. No
acute bony abnormality . Pulmonary apices are clear.
IMPRESSION: 1. Bilateral carotid atherosclerotic vascular disease.

2. Severe diffuse cervical spine degenerative change. No acute bony
abnormality identified. No acute soft tissue abnormality identified.

## 2018-10-24 ENCOUNTER — Other Ambulatory Visit: Payer: Self-pay | Admitting: Family Medicine

## 2018-10-24 DIAGNOSIS — Z0000 Encounter for general adult medical examination without abnormal findings: Secondary | ICD-10-CM

## 2018-10-24 DIAGNOSIS — E669 Obesity, unspecified: Secondary | ICD-10-CM

## 2018-10-24 DIAGNOSIS — I10 Essential (primary) hypertension: Secondary | ICD-10-CM

## 2018-10-24 DIAGNOSIS — I152 Hypertension secondary to endocrine disorders: Secondary | ICD-10-CM

## 2018-10-24 DIAGNOSIS — E1169 Type 2 diabetes mellitus with other specified complication: Secondary | ICD-10-CM

## 2018-10-24 DIAGNOSIS — E785 Hyperlipidemia, unspecified: Secondary | ICD-10-CM

## 2018-10-24 MED ORDER — CARVEDILOL 12.5 MG PO TABS: 12.5000 mg | ORAL_TABLET | Freq: Two times a day (BID) | ORAL | 0 refills | Status: DC

## 2018-10-24 MED ORDER — METFORMIN HCL 500 MG PO TABS: 500.0000 mg | ORAL_TABLET | Freq: Every day | ORAL | 0 refills | Status: DC

## 2018-10-24 MED ORDER — AMLODIPINE BESYLATE 5 MG PO TABS: 5.0000 mg | ORAL_TABLET | Freq: Every day | ORAL | 0 refills | Status: DC

## 2018-10-24 MED FILL — AMLODIPINE BESYLATE 5 MG TA: 5 | 30 days supply | Qty: 30 | Fill #0

## 2018-10-24 MED FILL — metFORMIN HCL 500 MG TABS: 500 | 30 days supply | Qty: 30 | Fill #0

## 2018-10-24 MED FILL — CARVEDILOL 12.5 MG TABLET: 12.5 | 30 days supply | Qty: 60 | Fill #0

## 2018-10-31 MED FILL — LOSARTAN POTASSIUM 100 MG T: 100 | 30 days supply | Qty: 30 | Fill #0

## 2018-11-12 ENCOUNTER — Other Ambulatory Visit: Payer: Self-pay | Admitting: Family Medicine

## 2018-11-13 ENCOUNTER — Telehealth: Payer: Self-pay | Admitting: Family Medicine

## 2018-11-13 MED ORDER — CYCLOBENZAPRINE HCL 10 MG PO TABS: 10.0000 mg | ORAL_TABLET | Freq: Two times a day (BID) | ORAL | 1 refills | Status: DC | PRN

## 2018-11-13 MED FILL — CYCLOBENZAPRINE HCL 10 MG T: 10 | 20 days supply | Qty: 40 | Fill #0

## 2018-11-13 NOTE — Telephone Encounter (Signed)
Pt left vm stating his flexeril was sent to [facility] and he does not use them any more due to them not accepting his insurance. Pt listed two pharmacies on the vm for someone to choose to send to. If someone could contact him to find out which one would work best for him.

## 2018-11-14 NOTE — Telephone Encounter (Signed)
Follow up call made to patient. States he called [facility] and had Flexerill Rx transferred from [facility]. States he has on hand.

## 2018-11-21 ENCOUNTER — Other Ambulatory Visit: Payer: Self-pay | Admitting: Family Medicine

## 2018-11-21 ENCOUNTER — Encounter: Payer: Self-pay | Admitting: Family Medicine

## 2018-11-21 DIAGNOSIS — E785 Hyperlipidemia, unspecified: Secondary | ICD-10-CM

## 2018-11-21 DIAGNOSIS — I10 Essential (primary) hypertension: Secondary | ICD-10-CM

## 2018-11-21 DIAGNOSIS — Z0000 Encounter for general adult medical examination without abnormal findings: Secondary | ICD-10-CM

## 2018-11-21 DIAGNOSIS — I152 Hypertension secondary to endocrine disorders: Secondary | ICD-10-CM

## 2018-11-21 DIAGNOSIS — E1169 Type 2 diabetes mellitus with other specified complication: Secondary | ICD-10-CM

## 2018-11-21 DIAGNOSIS — E669 Obesity, unspecified: Secondary | ICD-10-CM

## 2018-11-24 MED ORDER — LOSARTAN POTASSIUM 100 MG PO TABS: 100.0000 mg | ORAL_TABLET | Freq: Every day | ORAL | 0 refills | Status: DC

## 2018-11-24 MED FILL — LOSARTAN POTASSIUM 100 MG T: 100 | 15 days supply | Qty: 15 | Fill #0

## 2018-11-26 ENCOUNTER — Other Ambulatory Visit: Payer: Self-pay | Admitting: Family Medicine

## 2018-11-26 MED FILL — OMEPRAZOLE 40 MG CPDR: 40 | 30 days supply | Qty: 30 | Fill #1

## 2018-11-26 MED FILL — AMLODIPINE BESYLATE 5 MG TA: 5 | 30 days supply | Qty: 30 | Fill #0

## 2018-11-28 MED ORDER — METFORMIN HCL 500 MG PO TABS: 500.0000 mg | ORAL_TABLET | Freq: Every day | ORAL | 0 refills | Status: DC

## 2018-11-28 MED ORDER — AMLODIPINE BESYLATE 5 MG PO TABS: 5.0000 mg | ORAL_TABLET | Freq: Every day | ORAL | 0 refills | Status: DC

## 2018-11-28 MED FILL — metFORMIN HCL 500 MG TABS: 500 | 30 days supply | Qty: 30 | Fill #0

## 2018-11-28 MED FILL — AMLODIPINE BESYLATE 5 MG TA: 5 | 30 days supply | Qty: 30 | Fill #0

## 2018-12-03 ENCOUNTER — Telehealth: Payer: Self-pay

## 2018-12-03 NOTE — Telephone Encounter (Signed)
Copied from [place] [phone]. Topic: Appointment Scheduling - Scheduling Inquiry for Clinic >> Dec 03, 2018 11:35 AM Robert [name] E wrote: Reason for CRM: Pt needs to schedule a follow up virtual visit/ please advise

## 2018-12-03 NOTE — Telephone Encounter (Signed)
Follow up appointment scheduled per patient request.

## 2018-12-04 ENCOUNTER — Other Ambulatory Visit: Payer: Self-pay

## 2018-12-04 ENCOUNTER — Ambulatory Visit (INDEPENDENT_AMBULATORY_CARE_PROVIDER_SITE_OTHER): Payer: 59 | Admitting: Family Medicine

## 2018-12-04 ENCOUNTER — Telehealth: Payer: Self-pay | Admitting: Family Medicine

## 2018-12-04 DIAGNOSIS — I152 Hypertension secondary to endocrine disorders: Secondary | ICD-10-CM | POA: Diagnosis not present

## 2018-12-04 DIAGNOSIS — E669 Obesity, unspecified: Secondary | ICD-10-CM | POA: Diagnosis not present

## 2018-12-04 DIAGNOSIS — E1169 Type 2 diabetes mellitus with other specified complication: Secondary | ICD-10-CM

## 2018-12-04 DIAGNOSIS — I10 Essential (primary) hypertension: Secondary | ICD-10-CM

## 2018-12-04 DIAGNOSIS — Z0000 Encounter for general adult medical examination without abnormal findings: Secondary | ICD-10-CM

## 2018-12-04 DIAGNOSIS — K219 Gastro-esophageal reflux disease without esophagitis: Secondary | ICD-10-CM

## 2018-12-04 DIAGNOSIS — E785 Hyperlipidemia, unspecified: Secondary | ICD-10-CM | POA: Diagnosis not present

## 2018-12-04 MED ORDER — CARVEDILOL 12.5 MG PO TABS: 12.5000 mg | ORAL_TABLET | Freq: Two times a day (BID) | ORAL | 1 refills | Status: DC

## 2018-12-04 MED ORDER — LOSARTAN POTASSIUM-HCTZ 100-25 MG PO TABS: 1.0000 | ORAL_TABLET | Freq: Every day | ORAL | 1 refills | Status: DC

## 2018-12-04 MED ORDER — GLUCOSE BLOOD VI STRP: ORAL_STRIP | 12 refills | Status: DC

## 2018-12-04 MED ORDER — CYCLOBENZAPRINE HCL 10 MG PO TABS: 10.0000 mg | ORAL_TABLET | Freq: Two times a day (BID) | ORAL | 1 refills | Status: DC | PRN

## 2018-12-04 MED ORDER — AMLODIPINE BESYLATE 5 MG PO TABS: 5.0000 mg | ORAL_TABLET | Freq: Every day | ORAL | 1 refills | Status: DC

## 2018-12-04 MED ORDER — METFORMIN HCL 500 MG PO TABS: 500.0000 mg | ORAL_TABLET | Freq: Every day | ORAL | 1 refills | Status: DC

## 2018-12-04 MED ORDER — OMEPRAZOLE 40 MG PO CPDR: 40.0000 mg | DELAYED_RELEASE_CAPSULE | Freq: Every day | ORAL | 1 refills | Status: DC

## 2018-12-04 NOTE — Assessment & Plan Note (Signed)
no changes to meds. Encouraged heart healthy diet such as the DASH diet and exercise as tolerated.  

## 2018-12-04 NOTE — Assessment & Plan Note (Signed)
Encouraged heart healthy diet, increase exercise, avoid trans fats 

## 2018-12-04 NOTE — Assessment & Plan Note (Signed)
hgba1c acceptable, minimize simple carbs. Increase exercise as tolerated.  

## 2018-12-04 NOTE — Telephone Encounter (Signed)
LVM for pt to schedule a Return in about 2 months (around 02/03/2019) appt.

## 2018-12-04 NOTE — Assessment & Plan Note (Signed)
Avoid offending foods, start probiotics. Do not eat large meals in late evening and consider raising head of bed.  

## 2018-12-04 NOTE — Patient Instructions (Signed)
Omron bp cuff

## 2018-12-05 NOTE — Assessment & Plan Note (Signed)
He has been exercising more and eating better with smaller portions and has lost about 30 pounds over past year. Encouraged ongoing efforts. Is struggling without being able to go to the gym but is trying to stay active

## 2018-12-05 NOTE — Progress Notes (Signed)
Virtual Visit via Video Note  I connected with Robert [name] on 12/05/18 at  2:00 PM EDT by a video enabled telemedicine application and verified that I am speaking with the correct person using two identifiers.   I discussed the limitations of evaluation and management by telemedicine and the availability of in person appointments. The patient expressed understanding and agreed to proceed. Robert [name] was able to get him on video visit platform    Subjective:    Patient ID: Robert [name], male    DOB: 08-10-1971, 48 y.o.   MRN: [id number]  No chief complaint on file.   HPI Patient is in today for follow up on chronic medical concerns such as hypertension, hyperlipidemia, and obesity. He had been exercising regularly at the gym until the shutdown and is eating better fo he has lost 35#. No recent febrile illness or hospitalizations. He does not endorse polyuria or polydipsia. Notes some tingling in his hands at times especially upon just arising and after hold a book for an extended period of time.  No fall or injury.  Past Medical History:  Diagnosis Date  . Arthralgia 01/11/2013   Diffuse, b/l Hips R>L Knees, ankles, low back  . Back pain 12/08/2015  . Diabetes mellitus type 2 in obese ([place]) 11/09/2012  . Dyslipidemia 11/09/2012  . Esophageal reflux 12/08/2015  . History of migraine headaches   . Hyperglycemia 11/09/2012  . Hypertension     Past Surgical History:  Procedure Laterality Date  . NO PAST SURGERIES      Family History  Problem Relation Age of Onset  . Arthritis Mother   . Hypertension Mother   . Diabetes Mother   . Arthritis Father   . Hypertension Father   . Diabetes Father   . Sleep apnea Father   . Obesity Father   . Arthritis Maternal Grandmother   . Diabetes Maternal Grandmother   . Stroke Maternal Grandmother   . Arthritis Paternal Grandmother   . Diabetes Paternal Grandmother   . Cancer Paternal Grandmother        lung, smoker  . Diabetes Paternal  Grandfather   . Heart disease Paternal Grandfather 17       MI  . Hyperlipidemia Brother   . Hypertension Brother   . Diabetes Brother   . COPD Maternal Grandfather   . Kidney disease Neg Hx     Social History   Socioeconomic History  . Marital status: Married    Spouse name: Not on file  . Number of children: 4  . Years of education: Not on file  . Highest education level: Not on file  Occupational History  . Occupation: Software engineer: [employer]  . Financial resource strain: Not on file  . Food insecurity:    Worry: Not on file    Inability: Not on file  . Transportation needs:    Medical: Not on file    Non-medical: Not on file  Tobacco Use  . Smoking status: Former Smoker    Years: 9.00    Types: Cigarettes    Last attempt to quit: 08/14/1995    Years since quitting: 23.3  . Smokeless tobacco: Never Used  Substance and Sexual Activity  . Alcohol use: Yes    Alcohol/week: 6.0 - 10.0 standard drinks    Types: 6 - 10 Cans of beer per week  . Drug use: No  . Sexual activity: Yes    Comment: lives with wife and  daughter, no dietary restrictions  Lifestyle  . Physical activity:    Days per week: Not on file    Minutes per session: Not on file  . Stress: Not on file  Relationships  . Social connections:    Talks on phone: Not on file    Gets together: Not on file    Attends religious service: Not on file    Active member of club or organization: Not on file    Attends meetings of clubs or organizations: Not on file    Relationship status: Not on file  . Intimate partner violence:    Fear of current or ex partner: Not on file    Emotionally abused: Not on file    Physically abused: Not on file    Forced sexual activity: Not on file  Other Topics Concern  . 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 hydrochlorothiazide (HYDRODIURIL) 25 MG tablet TAKE 1 TABLET BY MOUTH ONCE DAILY 30 tablet 2  . losartan (COZAAR) 100 MG tablet Take 1 tablet (100 mg total) by mouth daily. 15 tablet 0  . amLODipine (NORVASC) 5 MG tablet Take 1 tablet (5 mg total) by mouth daily. 30 tablet 0  . carvedilol (COREG) 12.5 MG tablet Take 1 tablet (12.5 mg total) by mouth 2 (two) times daily with a meal. 60 tablet 0  . cyclobenzaprine (FLEXERIL) 10 MG tablet Take 1 tablet (10 mg total) by mouth 2 (two) times daily as needed for muscle spasms. 40 tablet 1  . metFORMIN (GLUCOPHAGE) 500 MG tablet Take 1 tablet (500 mg total) by mouth daily with breakfast. 30 tablet 0  . metFORMIN (GLUCOPHAGE) 500 MG tablet TAKE 1 TABLET (500 MG TOTAL) BY MOUTH DAILY WITH BREAKFAST. 30 tablet 0  . omeprazole (PRILOSEC) 40 MG capsule Take 1 capsule (40 mg total) by mouth daily. 90 capsule 3   No facility-administered medications prior to visit.     Allergies  Allergen Reactions  . Mucinex [Guaifenesin Er] Shortness Of Breath, Itching and Dermatitis    Swelling of hands and feet  . Nsaids     Aleve, Advil cause swelling, itching and rash  . Latex Rash    Review of Systems  Constitutional: Negative for fever and malaise/fatigue.  HENT: Negative for congestion.   Eyes: Negative for blurred vision.  Respiratory: Negative for shortness of breath.   Cardiovascular: Negative for chest pain, palpitations and leg swelling.  Gastrointestinal: Negative for abdominal pain, blood in stool and nausea.  Genitourinary: Negative for dysuria and frequency.  Musculoskeletal: Positive for back pain and neck pain. Negative for falls.  Skin: Negative for rash.  Neurological: Positive for tingling. Negative for dizziness, loss of consciousness and headaches.  Endo/Heme/Allergies:  Negative for environmental allergies.  Psychiatric/Behavioral: Negative for depression. The patient is not nervous/anxious.        Objective:    Physical Exam Constitutional:      Appearance: Normal appearance. He is not ill-appearing.  HENT:     Head: Normocephalic and atraumatic.     Nose: Nose normal.  Pulmonary:  Effort: Pulmonary effort is normal.  Neurological:     Mental Status: He is alert and oriented to person, place, and time.  Psychiatric:        Mood and Affect: Mood normal.        Behavior: Behavior normal.     Wt 255 lb (115.7 kg)   BMI 31.87 kg/m  Wt Readings from Last 3 Encounters:  12/04/18 255 lb (115.7 kg)  01/23/18 288 lb 9.6 oz (130.9 kg)  07/18/17 288 lb (130.6 kg)    Diabetic Foot Exam - Simple   No data filed     Lab Results  Component Value Date   WBC 7.3 01/23/2018   HGB 15.5 01/23/2018   HCT 44.8 01/23/2018   PLT 202.0 01/23/2018   GLUCOSE 156 (H) 01/23/2018   CHOL 152 01/23/2018   TRIG 117.0 01/23/2018   HDL 37.00 (L) 01/23/2018   LDLCALC 91 01/23/2018   ALT 30 01/23/2018   AST 20 01/23/2018   NA 139 01/23/2018   K 4.0 01/23/2018   CL 102 01/23/2018   CREATININE 1.12 01/23/2018   BUN 13 01/23/2018   CO2 28 01/23/2018   TSH 0.70 01/23/2018   HGBA1C 7.4 (H) 01/23/2018    Lab Results  Component Value Date   TSH 0.70 01/23/2018   Lab Results  Component Value Date   WBC 7.3 01/23/2018   HGB 15.5 01/23/2018   HCT 44.8 01/23/2018   MCV 87.8 01/23/2018   PLT 202.0 01/23/2018   Lab Results  Component Value Date   NA 139 01/23/2018   K 4.0 01/23/2018   CO2 28 01/23/2018   GLUCOSE 156 (H) 01/23/2018   BUN 13 01/23/2018   CREATININE 1.12 01/23/2018   BILITOT 0.7 01/23/2018   ALKPHOS 53 01/23/2018   AST 20 01/23/2018   ALT 30 01/23/2018   PROT 6.8 01/23/2018   ALBUMIN 4.3 01/23/2018   CALCIUM 9.5 01/23/2018   GFR 74.09 01/23/2018   Lab Results  Component Value Date   CHOL 152 01/23/2018   Lab Results   Component Value Date   HDL 37.00 (L) 01/23/2018   Lab Results  Component Value Date   LDLCALC 91 01/23/2018   Lab Results  Component Value Date   TRIG 117.0 01/23/2018   Lab Results  Component Value Date   CHOLHDL 4 01/23/2018   Lab Results  Component Value Date   HGBA1C 7.4 (H) 01/23/2018       Assessment & Plan:   Problem List Items Addressed This Visit    HTN (hypertension)    no changes to meds. Encouraged heart healthy diet such as the DASH diet and exercise as tolerated.       Relevant Medications   carvedilol (COREG) 12.5 MG tablet   amLODipine (NORVASC) 5 MG tablet   losartan-hydrochlorothiazide (HYZAAR) 100-25 MG tablet   Annual physical exam   Relevant Medications   carvedilol (COREG) 12.5 MG tablet   amLODipine (NORVASC) 5 MG tablet   Dyslipidemia    Encouraged heart healthy diet, increase exercise, avoid trans fats      Relevant Medications   carvedilol (COREG) 12.5 MG tablet   amLODipine (NORVASC) 5 MG tablet   Diabetes mellitus type 2 in obese (H[facility])    hgba1c acceptable, minimize simple carbs. Increase exercise as tolerated.       Relevant Medications   carvedilol (COREG) 12.5 MG tablet   metFORMIN (GLUCOPHAGE) 500 MG tablet   amLODipine (NORVASC) 5 MG tablet   losartan-hydrochlorothiazide (HYZAAR)  100-25 MG tablet   Obesity    He has been exercising more and eating better with smaller portions and has lost about 30 pounds over past year. Encouraged ongoing efforts. Is struggling without being able to go to the gym but is trying to stay active      Relevant Medications   carvedilol (COREG) 12.5 MG tablet   metFORMIN (GLUCOPHAGE) 500 MG tablet   amLODipine (NORVASC) 5 MG tablet   Esophageal reflux    Avoid offending foods, start probiotics. Do not eat large meals in late evening and consider raising head of bed.       Relevant Medications   omeprazole (PRILOSEC) 40 MG capsule      I am having Robert [name] start on glucose blood and  losartan-hydrochlorothiazide. I am also having him maintain his hydrochlorothiazide, losartan, carvedilol, metFORMIN, amLODipine, cyclobenzaprine, and omeprazole.  Meds ordered this encounter  Medications  . carvedilol (COREG) 12.5 MG tablet    Sig: Take 1 tablet (12.5 mg total) by mouth 2 (two) times daily with a meal.    Dispense:  180 tablet    Refill:  1    Requested drug refills are authorized, however, the patient needs further evaluation and/or laboratory testing before further refills are given. Ask him to make an appointment for this.  . metFORMIN (GLUCOPHAGE) 500 MG tablet    Sig: Take 1 tablet (500 mg total) by mouth daily with breakfast.    Dispense:  90 tablet    Refill:  1  . amLODipine (NORVASC) 5 MG tablet    Sig: Take 1 tablet (5 mg total) by mouth daily.    Dispense:  90 tablet    Refill:  1    Requested drug refills are authorized, however, the patient needs further evaluation and/or laboratory testing before further refills are given. Ask him to make an appointment for this.  . cyclobenzaprine (FLEXERIL) 10 MG tablet    Sig: Take 1 tablet (10 mg total) by mouth 2 (two) times daily as needed for muscle spasms.    Dispense:  40 tablet    Refill:  1  . omeprazole (PRILOSEC) 40 MG capsule    Sig: Take 1 capsule (40 mg total) by mouth daily.    Dispense:  90 capsule    Refill:  1  . glucose blood (COOL BLOOD GLUCOSE TEST STRIPS) test strip    Sig: Use as instructed    Dispense:  100 each    Refill:  12  . losartan-hydrochlorothiazide (HYZAAR) 100-25 MG tablet    Sig: Take 1 tablet by mouth daily.    Dispense:  90 tablet    Refill:  1   I discussed the assessment and treatment plan with the patient. The patient was provided an opportunity to ask questions and all were answered. The patient agreed with the plan and demonstrated an understanding of the instructions.   The patient was advised to call back or seek an in-person evaluation if the symptoms worsen or if the  condition fails to improve as anticipated.  I provided 25 minutes of non-face-to-face time during this encounter.   Penni Homans, MD

## 2018-12-10 ENCOUNTER — Other Ambulatory Visit: Payer: Self-pay | Admitting: Family Medicine

## 2019-01-02 ENCOUNTER — Encounter: Payer: Self-pay | Admitting: Family Medicine

## 2019-01-02 MED ORDER — GLUCOSE BLOOD VI STRP: ORAL_STRIP | 12 refills | Status: DC

## 2019-01-30 ENCOUNTER — Telehealth: Payer: Self-pay | Admitting: Family Medicine

## 2019-01-30 DIAGNOSIS — E785 Hyperlipidemia, unspecified: Secondary | ICD-10-CM

## 2019-01-30 DIAGNOSIS — Z0000 Encounter for general adult medical examination without abnormal findings: Secondary | ICD-10-CM

## 2019-01-30 DIAGNOSIS — I10 Essential (primary) hypertension: Secondary | ICD-10-CM

## 2019-01-30 DIAGNOSIS — E1169 Type 2 diabetes mellitus with other specified complication: Secondary | ICD-10-CM

## 2019-01-30 DIAGNOSIS — I152 Hypertension secondary to endocrine disorders: Secondary | ICD-10-CM

## 2019-01-30 DIAGNOSIS — E669 Obesity, unspecified: Secondary | ICD-10-CM

## 2019-01-30 MED ORDER — AMLODIPINE BESYLATE 5 MG PO TABS: 5.0000 mg | ORAL_TABLET | Freq: Every day | ORAL | 1 refills | Status: DC

## 2019-01-30 NOTE — Telephone Encounter (Signed)
Medication refilled

## 2019-01-30 NOTE — Telephone Encounter (Signed)
Pt is calling and needs a refill on amlodipine 5 mg#30 w/refills. Pt is out. [place]

## 2019-03-09 ENCOUNTER — Telehealth: Payer: Self-pay | Admitting: Family Medicine

## 2019-03-09 ENCOUNTER — Other Ambulatory Visit: Payer: Self-pay

## 2019-03-09 DIAGNOSIS — I152 Hypertension secondary to endocrine disorders: Secondary | ICD-10-CM

## 2019-03-09 DIAGNOSIS — E785 Hyperlipidemia, unspecified: Secondary | ICD-10-CM

## 2019-03-09 DIAGNOSIS — E669 Obesity, unspecified: Secondary | ICD-10-CM

## 2019-03-09 DIAGNOSIS — Z0000 Encounter for general adult medical examination without abnormal findings: Secondary | ICD-10-CM

## 2019-03-09 DIAGNOSIS — I10 Essential (primary) hypertension: Secondary | ICD-10-CM

## 2019-03-09 DIAGNOSIS — E1169 Type 2 diabetes mellitus with other specified complication: Secondary | ICD-10-CM

## 2019-03-09 MED ORDER — AMLODIPINE BESYLATE 5 MG PO TABS: 5.0000 mg | ORAL_TABLET | Freq: Every day | ORAL | 1 refills | Status: DC

## 2019-03-09 MED ORDER — METFORMIN HCL 500 MG PO TABS: 500.0000 mg | ORAL_TABLET | Freq: Every day | ORAL | 1 refills | Status: DC

## 2019-03-09 MED ORDER — LOSARTAN POTASSIUM-HCTZ 100-25 MG PO TABS: 1.0000 | ORAL_TABLET | Freq: Every day | ORAL | 1 refills | Status: DC

## 2019-03-09 MED ORDER — CARVEDILOL 12.5 MG PO TABS: 12.5000 mg | ORAL_TABLET | Freq: Two times a day (BID) | ORAL | 1 refills | Status: DC

## 2019-03-09 NOTE — Telephone Encounter (Signed)
Medications sent in to the pharmacy listed

## 2019-03-09 NOTE — Telephone Encounter (Signed)
Medication sent in to pharmacy listed

## 2019-03-09 NOTE — Telephone Encounter (Signed)
Medication: metFORMIN (GLUCOPHAGE) 500 MG tablet [[id number]] , amLODipine (NORVASC) 5 MG tablet [[id number]] , carvedilol (COREG) 12.5 MG tablet [[id number]] Patient does not understand why he the note is note is on the account fortesting is need.   Has the patient contacted their pharmacy? Yes  (Agent: If no, request that the patient contact the pharmacy for the refill.) (Agent: If yes, when and what did the pharmacy advise?)  Preferred Pharmacy (with phone number or street name): [facility]-information not file. And information was not shared.  Agent: Please be advised that RX refills may take up to 3 business days. We ask that you follow-up with your pharmacy.

## 2019-06-08 ENCOUNTER — Other Ambulatory Visit: Payer: Self-pay | Admitting: *Deleted

## 2019-06-08 MED ORDER — OMEPRAZOLE 40 MG PO CPDR: 40.0000 mg | DELAYED_RELEASE_CAPSULE | Freq: Every day | ORAL | 1 refills | Status: DC

## 2019-07-23 ENCOUNTER — Other Ambulatory Visit: Payer: Self-pay | Admitting: Family Medicine

## 2019-07-23 DIAGNOSIS — E669 Obesity, unspecified: Secondary | ICD-10-CM

## 2019-07-23 DIAGNOSIS — I152 Hypertension secondary to endocrine disorders: Secondary | ICD-10-CM

## 2019-07-23 DIAGNOSIS — I10 Essential (primary) hypertension: Secondary | ICD-10-CM

## 2019-07-23 DIAGNOSIS — Z0000 Encounter for general adult medical examination without abnormal findings: Secondary | ICD-10-CM

## 2019-07-23 DIAGNOSIS — E1169 Type 2 diabetes mellitus with other specified complication: Secondary | ICD-10-CM

## 2019-07-23 DIAGNOSIS — E785 Hyperlipidemia, unspecified: Secondary | ICD-10-CM

## 2019-10-21 ENCOUNTER — Other Ambulatory Visit: Payer: Self-pay | Admitting: Family Medicine

## 2019-10-21 DIAGNOSIS — I152 Hypertension secondary to endocrine disorders: Secondary | ICD-10-CM

## 2019-10-21 DIAGNOSIS — E1169 Type 2 diabetes mellitus with other specified complication: Secondary | ICD-10-CM

## 2019-10-21 DIAGNOSIS — E785 Hyperlipidemia, unspecified: Secondary | ICD-10-CM

## 2019-10-21 DIAGNOSIS — I10 Essential (primary) hypertension: Secondary | ICD-10-CM

## 2019-10-21 DIAGNOSIS — Z0000 Encounter for general adult medical examination without abnormal findings: Secondary | ICD-10-CM

## 2019-10-21 DIAGNOSIS — E669 Obesity, unspecified: Secondary | ICD-10-CM

## 2019-11-05 NOTE — Telephone Encounter (Signed)
3/10 mychart message came back as unread. Mailed letter to pt.

## 2019-12-22 ENCOUNTER — Other Ambulatory Visit: Payer: Self-pay | Admitting: Family Medicine

## 2020-01-12 ENCOUNTER — Telehealth: Payer: Self-pay | Admitting: Family Medicine

## 2020-01-12 DIAGNOSIS — I10 Essential (primary) hypertension: Secondary | ICD-10-CM

## 2020-01-12 DIAGNOSIS — E1169 Type 2 diabetes mellitus with other specified complication: Secondary | ICD-10-CM

## 2020-01-12 DIAGNOSIS — I152 Hypertension secondary to endocrine disorders: Secondary | ICD-10-CM

## 2020-01-12 DIAGNOSIS — E785 Hyperlipidemia, unspecified: Secondary | ICD-10-CM

## 2020-01-12 DIAGNOSIS — Z0000 Encounter for general adult medical examination without abnormal findings: Secondary | ICD-10-CM

## 2020-01-12 DIAGNOSIS — E669 Obesity, unspecified: Secondary | ICD-10-CM

## 2020-01-12 NOTE — Telephone Encounter (Signed)
Medication: amLODipine (NORVASC) 5 MG tablet  losartan-hydrochlorothiazi carvedilol (COREG) 12.5 MG tablet  metFORMIN (GLUCOPHAGE) 500 MG tablet omeprazole (PRILOSEC) 40 MG capsule   Patient has a CPE coming up with Robert [name] on 01/25/20 But he is out of meds   Has the patient contacted their pharmacy? No. (If no, request that the patient contact the pharmacy for the refill.) (If yes, when and what did the pharmacy advise?)  Preferred Pharmacy (with phone number or street name):   [place], [place]  [address] [name] [address]  Phone:  [phone] Fax:  [phone]   Agent: Please be advised that RX refills may take up to 3 business days. We ask that you follow-up with your pharmacy.

## 2020-01-13 MED ORDER — AMLODIPINE BESYLATE 5 MG PO TABS: 5.0000 mg | ORAL_TABLET | Freq: Every day | ORAL | 1 refills | Status: DC

## 2020-01-13 MED ORDER — OMEPRAZOLE 40 MG PO CPDR: DELAYED_RELEASE_CAPSULE | ORAL | 1 refills | Status: DC

## 2020-01-13 MED ORDER — LOSARTAN POTASSIUM-HCTZ 100-25 MG PO TABS: 1.0000 | ORAL_TABLET | Freq: Every day | ORAL | 1 refills | Status: DC

## 2020-01-13 MED ORDER — METFORMIN HCL 500 MG PO TABS: 500.0000 mg | ORAL_TABLET | Freq: Every day | ORAL | 1 refills | Status: DC

## 2020-01-13 MED ORDER — CARVEDILOL 12.5 MG PO TABS: ORAL_TABLET | ORAL | 1 refills | Status: DC

## 2020-01-13 NOTE — Telephone Encounter (Signed)
Patient notified that rxs have been sent in. 

## 2020-01-25 ENCOUNTER — Encounter: Payer: 59 | Admitting: Medical

## 2020-02-05 ENCOUNTER — Encounter: Payer: Self-pay | Admitting: Medical

## 2020-02-05 ENCOUNTER — Telehealth: Payer: Self-pay | Admitting: Medical

## 2020-02-05 ENCOUNTER — Other Ambulatory Visit: Payer: Self-pay

## 2020-02-05 ENCOUNTER — Ambulatory Visit (INDEPENDENT_AMBULATORY_CARE_PROVIDER_SITE_OTHER): Payer: BLUE CROSS/BLUE SHIELD | Admitting: Medical

## 2020-02-05 VITALS — BP 139/88 | HR 63 | Resp 18 | Ht 75.0 in | Wt 269.2 lb

## 2020-02-05 DIAGNOSIS — E1169 Type 2 diabetes mellitus with other specified complication: Secondary | ICD-10-CM | POA: Diagnosis not present

## 2020-02-05 DIAGNOSIS — E669 Obesity, unspecified: Secondary | ICD-10-CM

## 2020-02-05 DIAGNOSIS — Z1211 Encounter for screening for malignant neoplasm of colon: Secondary | ICD-10-CM

## 2020-02-05 DIAGNOSIS — G629 Polyneuropathy, unspecified: Secondary | ICD-10-CM

## 2020-02-05 DIAGNOSIS — G5603 Carpal tunnel syndrome, bilateral upper limbs: Secondary | ICD-10-CM

## 2020-02-05 DIAGNOSIS — Z0000 Encounter for general adult medical examination without abnormal findings: Secondary | ICD-10-CM | POA: Diagnosis not present

## 2020-02-05 DIAGNOSIS — Z125 Encounter for screening for malignant neoplasm of prostate: Secondary | ICD-10-CM

## 2020-02-05 DIAGNOSIS — Z1283 Encounter for screening for malignant neoplasm of skin: Secondary | ICD-10-CM

## 2020-02-05 LAB — COMPREHENSIVE METABOLIC PANEL
ALT: 25 U/L (ref 0–53)
AST: 21 U/L (ref 0–37)
Albumin: 4.4 g/dL (ref 3.5–5.2)
Alkaline Phosphatase: 55 U/L (ref 39–117)
BUN: 12 mg/dL (ref 6–23)
CO2: 28 mEq/L (ref 19–32)
Calcium: 9.6 mg/dL (ref 8.4–10.5)
Chloride: 101 mEq/L (ref 96–112)
Creatinine, Ser: 1.04 mg/dL (ref 0.40–1.50)
GFR: 75.31 mL/min (ref >60.00)
Glucose, Bld: 145 mg/dL — ABNORMAL HIGH (ref 70–99)
Potassium: 4 mEq/L (ref 3.5–5.1)
Sodium: 137 mEq/L (ref 135–145)
Total Bilirubin: 0.7 mg/dL (ref 0.2–1.2)
Total Protein: 6.6 g/dL (ref 6.0–8.3)

## 2020-02-05 LAB — LIPID PANEL
Cholesterol: 166 mg/dL (ref 0–200)
HDL: 40.7 mg/dL (ref >39.00)
LDL Cholesterol: 106 mg/dL — ABNORMAL HIGH (ref 0–99)
NonHDL: 125.26
Total CHOL/HDL Ratio: 4
Triglycerides: 98 mg/dL (ref 0.0–149.0)
VLDL: 19.6 mg/dL (ref 0.0–40.0)

## 2020-02-05 LAB — CBC
HCT: 46.5 % (ref 39.0–52.0)
Hemoglobin: 16 g/dL (ref 13.0–17.0)
MCHC: 34.4 g/dL (ref 30.0–36.0)
MCV: 89.4 fl (ref 78.0–100.0)
Platelets: 204 10*3/uL (ref 150.0–400.0)
RBC: 5.2 Mil/uL (ref 4.22–5.81)
RDW: 13.8 % (ref 11.5–15.5)
WBC: 6.5 10*3/uL (ref 4.0–10.5)

## 2020-02-05 LAB — VITAMIN B12: Vitamin B-12: 423 pg/mL (ref 211–911)

## 2020-02-05 LAB — HEMOGLOBIN A1C: Hgb A1c MFr Bld: 6.6 % — ABNORMAL HIGH (ref 4.6–6.5)

## 2020-02-05 LAB — PSA: PSA: 0.51 ng/mL (ref 0.10–4.00)

## 2020-02-05 MED ORDER — ATORVASTATIN CALCIUM 10 MG PO TABS: 10.0000 mg | ORAL_TABLET | Freq: Every day | ORAL | 3 refills | Status: DC

## 2020-02-05 NOTE — Progress Notes (Signed)
Subjective:    Patient ID: Robert [name], male    DOB: 03-01-1971, 49 y.o.   MRN: [id number]  HPI  Pt in for cpe/wellness.  Pt ate pastry this am. No history of high cholesterol.(pt wants labs done today).  Pt never had colonoscopy.   No regular exercise. Covid pandemic interupted his program. States healthy diet. Moderate alcohol use. Non smoker.  Pt needs a1c.. pt is on metformin.  Pt has not gotten covid vaccine  Pt checks his bp at home. He states 140/80 at times. But some better.    Review of Systems  Constitutional: Negative for chills, fatigue and fever.  Respiratory: Negative for cough, chest tightness, shortness of breath and wheezing.   Cardiovascular: Negative for chest pain and palpitations.  Gastrointestinal: Negative for abdominal pain.  Genitourinary: Negative for dysuria.  Musculoskeletal: Positive for back pain. Negative for joint swelling.  Skin: Negative for rash.  Neurological: Negative for dizziness and light-headedness.       Neuropathy.  Upper ext tingling and numbness at night left hand/wrist.  Hematological: Negative for adenopathy. Does not bruise/bleed easily.  Psychiatric/Behavioral: Negative for agitation, confusion and suicidal ideas. The patient is not nervous/anxious.     Past Medical History:  Diagnosis Date  . Arthralgia 01/11/2013   Diffuse, b/l Hips R>L Knees, ankles, low back  . Back pain 12/08/2015  . Diabetes mellitus type 2 in obese ([place]) 11/09/2012  . Dyslipidemia 11/09/2012  . Esophageal reflux 12/08/2015  . History of migraine headaches   . Hyperglycemia 11/09/2012  . Hypertension      Social History   Socioeconomic History  . Marital status: Married    Spouse name: Not on file  . Number of children: 4  . Years of education: Not on file  . Highest education level: Not on file  Occupational History  . Occupation: Software engineer: [employer]  Tobacco Use  . Smoking status: Former Smoker    Years: 9.00     Types: Cigarettes    Quit date: 08/14/1995    Years since quitting: 24.4  . Smokeless tobacco: Never Used  Vaping Use  . Vaping Use: Never used  Substance and Sexual Activity  . Alcohol use: Yes    Alcohol/week: 6.0 - 10.0 standard drinks    Types: 6 - 10 Cans of beer per week  . Drug use: No  . Sexual activity: Yes    Comment: lives with wife and daughter, no dietary restrictions  Other Topics Concern  . 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 Difficulty of Paying Living Expenses:   Food Insecurity:   . Worried About Charity fundraiser in the Last Year:   . Arboriculturist in the Last Year:   Transportation Needs:   . Film/video editor (Medical):   Marland Kitchen Lack of Transportation (Non-Medical):   Physical Activity:   . Days of Exercise per Week:   . Minutes of Exercise per Session:   Stress:   . Feeling of Stress :   Social Connections:   .  Frequency of Communication with Friends and Family:   . Frequency of Social Gatherings with Friends and Family:   . Attends Religious Services:   . Active Member of Clubs or Organizations:   . Attends Archivist Meetings:   Marland Kitchen Marital Status:   Intimate Partner Violence:   . Fear of Current or Ex-Partner:   . Emotionally Abused:   Marland Kitchen Physically Abused:   . Sexually Abused:     Past Surgical History:  Procedure Laterality Date  . NO PAST SURGERIES      Family History  Problem Relation Age of Onset  . Arthritis Mother   . Hypertension Mother   . Diabetes Mother   . Arthritis Father   . Hypertension Father   . Diabetes Father   . Sleep apnea Father   . Obesity Father   . Arthritis Maternal Grandmother   . Diabetes Maternal Grandmother    . Stroke Maternal Grandmother   . Arthritis Paternal Grandmother   . Diabetes Paternal Grandmother   . Cancer Paternal Grandmother        lung, smoker  . Diabetes Paternal Grandfather   . Heart disease Paternal Grandfather 34       MI  . Hyperlipidemia Brother   . Hypertension Brother   . Diabetes Brother   . COPD Maternal Grandfather   . Kidney disease Neg Hx     Allergies  Allergen Reactions  . Mucinex [Guaifenesin Er] Shortness Of Breath, Itching and Dermatitis    Swelling of hands and feet  . Nsaids     Aleve, Advil cause swelling, itching and rash  . Latex Rash    Current Outpatient Medications on File Prior to Visit  Medication Sig Dispense Refill  . amLODipine (NORVASC) 5 MG tablet Take 1 tablet (5 mg total) by mouth daily. 90 tablet 1  . carvedilol (COREG) 12.5 MG tablet TAKE 1 TABLET BY MOUTH  TWICE DAILY WITH A MEAL 180 tablet 1  . glucose blood (COOL BLOOD GLUCOSE TEST STRIPS) test strip Check blood sugar once daily 100 each 12  . hydrochlorothiazide (HYDRODIURIL) 25 MG tablet TAKE 1 TABLET BY MOUTH ONCE DAILY 30 tablet 2  . losartan-hydrochlorothiazide (HYZAAR) 100-25 MG tablet Take 1 tablet by mouth daily. 90 tablet 1  . metFORMIN (GLUCOPHAGE) 500 MG tablet Take 1 tablet (500 mg total) by mouth daily with breakfast. 90 tablet 1  . omeprazole (PRILOSEC) 40 MG capsule TAKE 1 CAPSULE (40 MG TOTAL) BY MOUTH DAILY. 90 capsule 1  . cyclobenzaprine (FLEXERIL) 10 MG tablet TAKE ONE TABLET BY MOUTH TWICE A DAY AS NEEDED FOR MUSCLE SPASMS. 40 tablet 0   No current facility-administered medications on file prior to visit.    BP (!) 142/80 (BP Location: Left Arm, Patient Position: Sitting, Cuff Size: Normal)   Pulse 63   Resp 18   Ht 6\' 3"  (1.905 m)   Wt 269 lb 3.2 oz (122.1 kg)   SpO2 98%   BMI 33.65 kg/m       Objective:   Physical Exam  General Mental Status- Alert. General Appearance- Not in acute distress.   Skin General: Color- Normal Color. Moisture-  Normal Moisture.  Neck Carotid Arteries- Normal color. Moisture- Normal Moisture. No carotid bruits. No JVD.  Chest and Lung Exam Auscultation: Breath Sounds:-Normal.  Cardiovascular Auscultation:Rythm- Regular. Murmurs & Other Heart Sounds:Auscultation of the heart reveals- No Murmurs.  Abdomen Inspection:-Inspeection Normal. Palpation/Percussion:Note:No mass. Palpation and Percussion of the abdomen reveal- Non Tender, Non Distended +  BS, no rebound or guarding.   Neurologic Cranial Nerve exam:- CN III-XII intact(No nystagmus), symmetric smile. Strength:- 5/5 equal and symmetric strength both upper and lower extremities.  Upper ext/wrist- easily induced phalens sign.      Assessment & Plan:  For you wellness exam today I have ordered cbc, cmp, b12, b1, a1c  and  lipid panel.  Vaccine appear up to date. Declines covid vaccine.  Recommend exercise and healthy diet.  We will let you know lab results as they come in.  Follow up date appointment will be determined after lab review.   For htn, would recommend start exercising and some wt loss might drop your blood pressure by 5-10 points. If blood pressure does not improve would recommend increasing 10 mg amlodipine.  For hx of diabetes recheck a1c today.  For carpel tunnel syndrome advise wrist cock up splint.   For neuropathy b12 and b1 level.  You mentioned low back pain with radiating pain at times for years Can offer xray l spine if you want to get in future.  Mackie Pai, PA-C   In addition to wellness did address, bp elevation, diabetes, carpel tunnel, neurpathy and discussed back pain. [id number] charge due to additional 15 minutes.

## 2020-02-05 NOTE — Patient Instructions (Addendum)
For you wellness exam today I have ordered cbc, cmp, b12, b1, a1c, psa  and  lipid panel.  Vaccine appear up to date. Declines covid vaccine.  Recommend exercise and healthy diet.  We will let you know lab results as they come in.  Follow up date appointment will be determined after lab review.   For htn, would recommend start exercising and some wt loss might drop your blood pressure by 5-10 points. If blood pressure does not improve would recommend increasing 10 mg amlodipine.  For hx of diabetes recheck a1c today.  For carpel tunnel syndrome advise wrist cock up splint.   For neuropathy b12 and b1 level.  You mentioned low back pain with radiating pain at times for years Can offer xray l spine if you want to get in future.   Preventive Care 90-56 Years Old, Male Preventive care refers to lifestyle choices and visits with your health care provider that can promote health and wellness. This includes:  A yearly physical exam. This is also called an annual well check.  Regular dental and eye exams.  Immunizations.  Screening for certain conditions.  Healthy lifestyle choices, such as eating a healthy diet, getting regular exercise, not using drugs or products that contain nicotine and tobacco, and limiting alcohol use. What can I expect for my preventive care visit? Physical exam Your health care provider will check:  Height and weight. These may be used to calculate body mass index (BMI), which is a measurement that tells if you are at a healthy weight.  Heart rate and blood pressure.  Your skin for abnormal spots. Counseling Your health care provider may ask you questions about:  Alcohol, tobacco, and drug use.  Emotional well-being.  Home and relationship well-being.  Sexual activity.  Eating habits.  Work and work Statistician. What immunizations do I need?  Influenza (flu) vaccine  This is recommended every year. Tetanus, diphtheria, and pertussis  (Tdap) vaccine  You may need a Td booster every 10 years. Varicella (chickenpox) vaccine  You may need this vaccine if you have not already been vaccinated. Zoster (shingles) vaccine  You may need this after age 79. Measles, mumps, and rubella (MMR) vaccine  You may need at least one dose of MMR if you were born in 1957 or later. You may also need a second dose. Pneumococcal conjugate (PCV13) vaccine  You may need this if you have certain conditions and were not previously vaccinated. Pneumococcal polysaccharide (PPSV23) vaccine  You may need one or two doses if you smoke cigarettes or if you have certain conditions. Meningococcal conjugate (MenACWY) vaccine  You may need this if you have certain conditions. Hepatitis A vaccine  You may need this if you have certain conditions or if you travel or work in places where you may be exposed to hepatitis A. Hepatitis B vaccine  You may need this if you have certain conditions or if you travel or work in places where you may be exposed to hepatitis B. Haemophilus influenzae type b (Hib) vaccine  You may need this if you have certain risk factors. Human papillomavirus (HPV) vaccine  If recommended by your health care provider, you may need three doses over 6 months. You may receive vaccines as individual doses or as more than one vaccine together in one shot (combination vaccines). Talk with your health care provider about the risks and benefits of combination vaccines. What tests do I need? Blood tests  Lipid and cholesterol levels. These may  be checked every 5 years, or more frequently if you are over 2 years old.  Hepatitis C test.  Hepatitis B test. Screening  Lung cancer screening. You may have this screening every year starting at age 26 if you have a 30-pack-year history of smoking and currently smoke or have quit within the past 15 years.  Prostate cancer screening. Recommendations will vary depending on your family  history and other risks.  Colorectal cancer screening. All adults should have this screening starting at age 63 and continuing until age 73. Your health care provider may recommend screening at age 30 if you are at increased risk. You will have tests every 1-10 years, depending on your results and the type of screening test.  Diabetes screening. This is done by checking your blood sugar (glucose) after you have not eaten for a while (fasting). You may have this done every 1-3 years.  Sexually transmitted disease (STD) testing. Follow these instructions at home: Eating and drinking  Eat a diet that includes fresh fruits and vegetables, whole grains, lean protein, and low-fat dairy products.  Take vitamin and mineral supplements as recommended by your health care provider.  Do not drink alcohol if your health care provider tells you not to drink.  If you drink alcohol: ? Limit how much you have to 0-2 drinks a day. ? Be aware of how much alcohol is in your drink. In the [place] one drink equals one 12 oz bottle of beer (355 mL), one 5 oz glass of wine (148 mL), or one 1 oz glass of hard liquor (44 mL). Lifestyle  Take daily care of your teeth and gums.  Stay active. Exercise for at least 30 minutes on 5 or more days each week.  Do not use any products that contain nicotine or tobacco, such as cigarettes, e-cigarettes, and chewing tobacco. If you need help quitting, ask your health care provider.  If you are sexually active, practice safe sex. Use a condom or other form of protection to prevent STIs (sexually transmitted infections).  Talk with your health care provider about taking a low-dose aspirin every day starting at age 15. What's next?  Go to your health care provider once a year for a well check visit.  Ask your health care provider how often you should have your eyes and teeth checked.  Stay up to date on all vaccines. This information is not intended to replace advice  given to you by your health care provider. Make sure you discuss any questions you have with your health care provider. Document Revised: 07/24/2018 Document Reviewed: 07/24/2018 Elsevier Patient Education  2020 [employer].

## 2020-02-05 NOTE — Telephone Encounter (Signed)
Atorvastatin rx sent to pt pharmacy. 

## 2020-02-11 LAB — VITAMIN B1: Vitamin B1 (Thiamine): 16 nmol/L (ref 8–30)

## 2020-02-18 ENCOUNTER — Other Ambulatory Visit: Payer: Self-pay | Admitting: Family Medicine

## 2020-02-18 DIAGNOSIS — I10 Essential (primary) hypertension: Secondary | ICD-10-CM

## 2020-02-18 DIAGNOSIS — E669 Obesity, unspecified: Secondary | ICD-10-CM

## 2020-02-18 DIAGNOSIS — Z0000 Encounter for general adult medical examination without abnormal findings: Secondary | ICD-10-CM

## 2020-02-18 DIAGNOSIS — E1169 Type 2 diabetes mellitus with other specified complication: Secondary | ICD-10-CM

## 2020-02-18 DIAGNOSIS — E785 Hyperlipidemia, unspecified: Secondary | ICD-10-CM

## 2020-02-18 DIAGNOSIS — I152 Hypertension secondary to endocrine disorders: Secondary | ICD-10-CM

## 2020-02-18 MED ORDER — CARVEDILOL 12.5 MG PO TABS: ORAL_TABLET | ORAL | 1 refills | Status: DC

## 2020-02-18 MED ORDER — METFORMIN HCL 500 MG PO TABS: 500.0000 mg | ORAL_TABLET | Freq: Every day | ORAL | 1 refills | Status: DC

## 2020-02-18 MED ORDER — AMLODIPINE BESYLATE 5 MG PO TABS: 5.0000 mg | ORAL_TABLET | Freq: Every day | ORAL | 1 refills | Status: DC

## 2020-02-18 MED ORDER — OMEPRAZOLE 40 MG PO CPDR: DELAYED_RELEASE_CAPSULE | ORAL | 1 refills | Status: DC

## 2020-02-18 MED ORDER — LOSARTAN POTASSIUM-HCTZ 100-25 MG PO TABS: 1.0000 | ORAL_TABLET | Freq: Every day | ORAL | 1 refills | Status: DC

## 2020-02-18 NOTE — Telephone Encounter (Signed)
Medication: amLODipine (NORVASC) 5 MG tablet [[id number]]   carvedilol (COREG) 12.5 MG tablet [[id number]   losartan-hydrochlorothiazide (HYZAAR) 100-25 MG tablet   metFORMIN (GLUCOPHAGE) 500 MG tablet [[id number]  omeprazole (PRILOSEC) 40 MG capsule [[id number]  Has the patient contacted their pharmacy? No. (If no, request that the patient contact the pharmacy for the refill.) (If yes, when and what did the pharmacy advise?)  Preferred Pharmacy (with phone number or street name): [facility]. [place], [place] [phone]   Agent: Please be advised that RX refills may take up to 3 business days. We ask that you follow-up with your pharmacy.  Per patient, Per his  insurance patient need 90Day Supply of these meds.

## 2020-02-18 NOTE — Telephone Encounter (Signed)
Spoke with pt and advised him that a 3 month supply was sent to [facility] on 01/13/20 for each of the medications requested below. He states that the pharmacy only dispensed a 30 day supply of each medication at that time. He now has new insurance that requires [facility] use [facility] and requires 90 day supply of RXs. Advised pt we would send refills to [facility].  Refills sent.

## 2020-03-21 ENCOUNTER — Telehealth: Payer: Self-pay | Admitting: Family Medicine

## 2020-03-21 MED ORDER — OMEPRAZOLE 40 MG PO CPDR: DELAYED_RELEASE_CAPSULE | ORAL | 1 refills | Status: DC

## 2020-03-21 NOTE — Telephone Encounter (Signed)
[facility] states [facility] states he would need approval form PCP to receive 90 day supply of :  omeprazole (PRILOSEC) 40 MG capsule [[id number]]    Please Advise

## 2020-03-21 NOTE — Telephone Encounter (Signed)
90day supply sent.

## 2020-04-12 ENCOUNTER — Encounter: Payer: Self-pay | Admitting: Medical

## 2020-06-09 ENCOUNTER — Other Ambulatory Visit: Payer: Self-pay

## 2020-06-09 ENCOUNTER — Ambulatory Visit (HOSPITAL_BASED_OUTPATIENT_CLINIC_OR_DEPARTMENT_OTHER)
Admission: RE | Admit: 2020-06-09 | Discharge: 2020-06-09 | Disposition: A | Discharge status: Home / Self Care | Payer: BLUE CROSS/BLUE SHIELD | Source: Ambulatory Visit | Attending: Family Medicine | Admitting: Family Medicine

## 2020-06-09 ENCOUNTER — Encounter: Payer: Self-pay | Admitting: Family Medicine

## 2020-06-09 ENCOUNTER — Ambulatory Visit: Payer: BLUE CROSS/BLUE SHIELD | Admitting: Family Medicine

## 2020-06-09 VITALS — BP 122/66 | HR 71 | Temp 97.9°F | Wt 275.2 lb

## 2020-06-09 DIAGNOSIS — F4321 Adjustment disorder with depressed mood: Secondary | ICD-10-CM

## 2020-06-09 DIAGNOSIS — E1169 Type 2 diabetes mellitus with other specified complication: Secondary | ICD-10-CM

## 2020-06-09 DIAGNOSIS — E785 Hyperlipidemia, unspecified: Secondary | ICD-10-CM

## 2020-06-09 DIAGNOSIS — M542 Cervicalgia: Secondary | ICD-10-CM | POA: Diagnosis not present

## 2020-06-09 DIAGNOSIS — M79641 Pain in right hand: Secondary | ICD-10-CM

## 2020-06-09 DIAGNOSIS — I10 Essential (primary) hypertension: Secondary | ICD-10-CM | POA: Diagnosis not present

## 2020-06-09 DIAGNOSIS — Z1211 Encounter for screening for malignant neoplasm of colon: Secondary | ICD-10-CM

## 2020-06-09 DIAGNOSIS — E669 Obesity, unspecified: Secondary | ICD-10-CM

## 2020-06-09 DIAGNOSIS — M79642 Pain in left hand: Secondary | ICD-10-CM

## 2020-06-09 HISTORY — DX: Adjustment disorder with depressed mood: F43.21

## 2020-06-09 LAB — HM DIABETES EYE EXAM

## 2020-06-09 MED ORDER — PANTOPRAZOLE SODIUM 40 MG PO TBEC: 40.0000 mg | DELAYED_RELEASE_TABLET | Freq: Every day | ORAL | 1 refills | Status: DC

## 2020-06-09 MED ORDER — TIZANIDINE HCL 2 MG PO TABS: 1.0000 mg | ORAL_TABLET | Freq: Two times a day (BID) | ORAL | 2 refills | Status: DC | PRN

## 2020-06-09 NOTE — Assessment & Plan Note (Signed)
L>R and has responded to splinting some, encouraged to add icing and lidocaine patches and if pain and numbness into 1st, 4th and 5th fingers does not improve may need to consider referral to hand surgeon.

## 2020-06-09 NOTE — Patient Instructions (Signed)
Musculoskeletal Pain Musculoskeletal pain refers to aches and pains in your bones, joints, muscles, and the tissues that surround them. This pain can occur in any part of the body. It can last for a short time (acute) or a long time (chronic). A physical exam, lab tests, and imaging studies may be done to find the cause of your musculoskeletal pain. Follow these instructions at home:  Lifestyle  Try to control or lower your stress levels. Stress increases muscle tension and can worsen musculoskeletal pain. It is important to recognize when you are anxious or stressed and learn ways to manage it. This may include: ? Meditation or yoga. ? Cognitive or behavioral therapy. ? Acupuncture or massage therapy.  You may continue all activities unless the activities cause more pain. When the pain gets better, slowly resume your normal activities. Gradually increase the intensity and duration of your activities or exercise. Managing pain, stiffness, and swelling  Take over-the-counter and prescription medicines only as told by your health care provider.  When your pain is severe, bed rest may be helpful. Lie or sit in any position that is comfortable, but get out of bed and walk around at least every couple of hours.  If directed, apply heat to the affected area as often as told by your health care provider. Use the heat source that your health care provider recommends, such as a moist heat pack or a heating pad. ? Place a towel between your skin and the heat source. ? Leave the heat on for 20-30 minutes. ? Remove the heat if your skin turns bright red. This is especially important if you are unable to feel pain, heat, or cold. You may have a greater risk of getting burned.  If directed, put ice on the painful area. ? Put ice in a plastic bag. ? Place a towel between your skin and the bag. ? Leave the ice on for 20 minutes, 2-3 times a day. General instructions  Your health care provider may  recommend that you see a physical therapist. This person can help you come up with a safe exercise program. Do any exercises as told by your physical therapist.  Keep all follow-up visits, including any physical therapy visits, as told by your health care providers. This is important. Contact a health care provider if:  Your pain gets worse.  Medicines do not help ease your pain.  You cannot use the part of your body that hurts, such as your arm, leg, or neck.  You have trouble sleeping.  You have trouble doing your normal activities. Get help right away if:  You have a new injury and your pain is worse or different.  You feel numb or you have tingling in the painful area. Summary  Musculoskeletal pain refers to aches and pains in your bones, joints, muscles, and the tissues that surround them.  This pain can occur in any part of the body.  Your health care provider may recommend that you see a physical therapist. This person can help you come up with a safe exercise program. Do any exercises as told by your physical therapist.  Lower your stress level. Stress can worsen musculoskeletal pain. Ways to lower stress may include meditation, yoga, cognitive or behavioral therapy, acupuncture, and massage therapy. This information is not intended to replace advice given to you by your health care provider. Make sure you discuss any questions you have with your health care provider. Document Revised: 07/12/2017 Document Reviewed: 08/29/2016 Elsevier Patient   Education  2020 [employer]  

## 2020-06-09 NOTE — Progress Notes (Signed)
Subjective:    Patient ID: Robert [name], male    DOB: [birth year], 49 y.o.   MRN: [id number]  Chief Complaint  Patient presents with  . Follow-up  . Hypertension    HPI Patient is in today for follow up on chronic medical concerns. No recent febrile illness or hospitalizations. He is struggling with grief after his father died unexpectedly a couple of weeks ago. His father was originally diagnosed with a sinus infection but was ultimately diagnosed with cancer and never woke up again after his surgery. The patient himself is struggling with increased neck and shoulder pain as well as radicular numbness and weakness into left hand at times. Especially upon awakening a driving. Has worsened since he took a job with increased heavy lifting. Denies CP/palp/SOB/HA/congestion/fevers/GI or GU c/o. Taking meds as prescribed  Past Medical History:  Diagnosis Date  . Arthralgia 01/11/2013   Diffuse, b/l Hips R>L Knees, ankles, low back  . Back pain 12/08/2015  . Diabetes mellitus type 2 in obese ([facility]) 11/09/2012  . Dyslipidemia 11/09/2012  . Esophageal reflux 12/08/2015  . History of migraine headaches   . Hyperglycemia 11/09/2012  . Hypertension     Past Surgical History:  Procedure Laterality Date  . NO PAST SURGERIES      Family History  Problem Relation Age of Onset  . Arthritis Mother   . Hypertension Mother   . Diabetes Mother   . Arthritis Father   . Hypertension Father   . Diabetes Father   . Sleep apnea Father   . Obesity Father   . Arthritis Maternal Grandmother   . Diabetes Maternal Grandmother   . Stroke Maternal Grandmother   . Arthritis Paternal Grandmother   . Diabetes Paternal Grandmother   . Cancer Paternal Grandmother        lung, smoker  . Diabetes Paternal Grandfather   . Heart disease Paternal Grandfather 41       MI  . Hyperlipidemia Brother   . Hypertension Brother   . Diabetes Brother   . COPD Maternal Grandfather   . Kidney disease Neg Hx     Social  History   Socioeconomic History  . Marital status: Married    Spouse name: Not on file  . Number of children: 4  . Years of education: Not on file  . Highest education level: Not on file  Occupational History  . Occupation: Software engineer: [employer]  Tobacco Use  . Smoking status: Former Smoker    Years: 9.00    Types: Cigarettes    Quit date: 08/14/1995    Years since quitting: 24.8  . Smokeless tobacco: Never Used  Vaping Use  . Vaping Use: Never used  Substance and Sexual Activity  . Alcohol use: Yes    Alcohol/week: 6.0 - 10.0 standard drinks    Types: 6 - 10 Cans of beer per week  . Drug use: No  . Sexual activity: Yes    Comment: lives with wife and daughter, no dietary restrictions  Other Topics Concern  . 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 Difficulty of Paying Living Expenses: Not on file  Food Insecurity:   . Worried About Charity fundraiser in the Last Year: Not on file  . Ran Out of Food in the Last Year: Not on file  Transportation Needs:   . Lack of Transportation (Medical): Not on file  . Lack of Transportation (Non-Medical): Not on file  Physical Activity:   . Days of Exercise per Week: Not on file  . Minutes of Exercise per Session: Not on file  Stress:   . Feeling of Stress : Not on file  Social Connections:   . Frequency of Communication with Friends and Family: Not on file  . Frequency of Social Gatherings with Friends and Family: Not on file  . Attends Religious Services: Not on file  . Active Member of Clubs or Organizations: Not on file  . Attends Archivist Meetings: Not on file  . Marital Status: Not on file  Intimate Partner  Violence:   . Fear of Current or Ex-Partner: Not on file  . Emotionally Abused: Not on file  . Physically Abused: Not on file  . Sexually Abused: Not on file    Outpatient Medications Prior to Visit  Medication Sig Dispense Refill  . amLODipine (NORVASC) 5 MG tablet Take 1 tablet (5 mg total) by mouth daily. 90 tablet 1  . atorvastatin (LIPITOR) 10 MG tablet Take 1 tablet (10 mg total) by mouth daily. 30 tablet 3  . carvedilol (COREG) 12.5 MG tablet TAKE 1 TABLET BY MOUTH  TWICE DAILY WITH A MEAL 180 tablet 1  . glucose blood (COOL BLOOD GLUCOSE TEST STRIPS) test strip Check blood sugar once daily 100 each 12  . hydrochlorothiazide (HYDRODIURIL) 25 MG tablet TAKE 1 TABLET BY MOUTH ONCE DAILY 30 tablet 2  . losartan-hydrochlorothiazide (HYZAAR) 100-25 MG tablet Take 1 tablet by mouth daily. 90 tablet 1  . metFORMIN (GLUCOPHAGE) 500 MG tablet Take 1 tablet (500 mg total) by mouth daily with breakfast. 90 tablet 1  . omeprazole (PRILOSEC) 40 MG capsule TAKE 1 CAPSULE (40 MG TOTAL) BY MOUTH DAILY. 90 capsule 1  . cyclobenzaprine (FLEXERIL) 10 MG tablet TAKE ONE TABLET BY MOUTH TWICE A DAY AS NEEDED FOR MUSCLE SPASMS. 40 tablet 0   No facility-administered medications prior to visit.    Allergies  Allergen Reactions  . Mucinex [Guaifenesin Er] Shortness Of Breath, Itching and Dermatitis    Swelling of hands and feet  . Nsaids     Aleve, Advil cause swelling, itching and rash  . Latex Rash    Review of Systems  Constitutional: Positive for malaise/fatigue. Negative for fever.  HENT: Negative for congestion.   Eyes: Negative for blurred vision.  Respiratory: Negative for shortness of breath.   Cardiovascular: Negative for chest pain, palpitations and leg swelling.  Gastrointestinal: Negative for abdominal pain, blood in stool and nausea.  Genitourinary: Negative for dysuria and frequency.  Musculoskeletal: Positive for back pain, joint pain, myalgias and neck pain. Negative for falls.   Skin: Negative for rash.  Neurological: Negative for dizziness, loss of consciousness and headaches.  Endo/Heme/Allergies: Negative for environmental allergies.  Psychiatric/Behavioral: Positive for depression. The patient is not nervous/anxious.        Objective:    Physical Exam Vitals and nursing note reviewed.  Constitutional:      General: He is not in acute distress.    Appearance: He is well-developed.  HENT:     Head:  Normocephalic and atraumatic.     Nose: Nose normal.  Eyes:     General:        Right eye: No discharge.        Left eye: No discharge.  Cardiovascular:     Rate and Rhythm: Normal rate and regular rhythm.     Heart sounds: No murmur heard.   Pulmonary:     Effort: Pulmonary effort is normal.     Breath sounds: Normal breath sounds.  Abdominal:     General: Bowel sounds are normal.     Palpations: Abdomen is soft.     Tenderness: There is no abdominal tenderness.  Musculoskeletal:     Cervical back: Normal range of motion and neck supple.  Skin:    General: Skin is warm and dry.  Neurological:     Mental Status: He is alert and oriented to person, place, and time.     BP 122/66 (BP Location: Left Arm)   Pulse 71   Temp 97.9 F (36.6 C) (Oral)   Wt 275 lb 3.2 oz (124.8 kg)   SpO2 95%   BMI 34.40 kg/m  Wt Readings from Last 3 Encounters:  06/09/20 275 lb 3.2 oz (124.8 kg)  02/05/20 269 lb 3.2 oz (122.1 kg)  12/04/18 255 lb (115.7 kg)    Diabetic Foot Exam - Simple   No data filed     Lab Results  Component Value Date   WBC 7.0 06/09/2020   HGB 16.0 06/09/2020   HCT 46.0 06/09/2020   PLT 217 06/09/2020   GLUCOSE 145 (H) 02/05/2020   CHOL 166 02/05/2020   TRIG 98.0 02/05/2020   HDL 40.70 02/05/2020   LDLCALC 106 (H) 02/05/2020   ALT 25 02/05/2020   AST 21 02/05/2020   NA 137 02/05/2020   K 4.0 02/05/2020   CL 101 02/05/2020   CREATININE 1.04 02/05/2020   BUN 12 02/05/2020   CO2 28 02/05/2020   TSH 0.70 01/23/2018   PSA  0.51 02/05/2020   HGBA1C 6.6 (H) 02/05/2020    Lab Results  Component Value Date   TSH 0.70 01/23/2018   Lab Results  Component Value Date   WBC 7.0 06/09/2020   HGB 16.0 06/09/2020   HCT 46.0 06/09/2020   MCV 88.6 06/09/2020   PLT 217 06/09/2020   Lab Results  Component Value Date   NA 137 02/05/2020   K 4.0 02/05/2020   CO2 28 02/05/2020   GLUCOSE 145 (H) 02/05/2020   BUN 12 02/05/2020   CREATININE 1.04 02/05/2020   BILITOT 0.7 02/05/2020   ALKPHOS 55 02/05/2020   AST 21 02/05/2020   ALT 25 02/05/2020   PROT 6.6 02/05/2020   ALBUMIN 4.4 02/05/2020   CALCIUM 9.6 02/05/2020   GFR 75.31 02/05/2020   Lab Results  Component Value Date   CHOL 166 02/05/2020   Lab Results  Component Value Date   HDL 40.70 02/05/2020   Lab Results  Component Value Date   LDLCALC 106 (H) 02/05/2020   Lab Results  Component Value Date   TRIG 98.0 02/05/2020   Lab Results  Component Value Date   CHOLHDL 4 02/05/2020   Lab Results  Component Value Date   HGBA1C 6.6 (H) 02/05/2020       Assessment & Plan:   Problem List Items Addressed This Visit    HTN (hypertension)    Well controlled, no changes to meds. Encouraged heart healthy diet such as the DASH diet and exercise  as tolerated.       Relevant Orders   CBC (Completed)   Comprehensive metabolic panel   TSH   Colon cancer screening    Referred to gastroenterology for screening colonoscopy      Relevant Orders   Ambulatory referral to Gastroenterology   Dyslipidemia    Encouraged heart healthy diet, increase exercise, avoid trans fats, consider a krill oil cap daily      Relevant Orders   Lipid panel   Diabetes mellitus type 2 in obese ([facility])    hgba1c acceptable, minimize simple carbs. Increase exercise as tolerated. Continue current meds      Relevant Orders   Hemoglobin A1c   Feeling grief    His father died suddenly 2 weeks ago after a brief illness with cancer, he has returned to work but it has  been difficult but he is managing things ok mostly. He will let us know if things need to be address      Neck pain - Primary    Encouraged moist heat and gentle stretching as tolerated. May try NSAIDs and prescription meds as directed and report if symptoms worsen or seek immediate care. Tizanidine 1-4 mg po bid prn, xray of neck ordered and consider chiropractic and/or PT care.       Relevant Medications   tiZANidine (ZANAFLEX) 2 MG tablet   Other Relevant Orders   DG Cervical Spine 2 or 3 views   Bilateral hand pain    L>R and has responded to splinting some, encouraged to add icing and lidocaine patches and if pain and numbness into 1st, 4th and 5th fingers does not improve may need to consider referral to hand surgeon.          I have discontinued Nimai Perrault's omeprazole. I am also having him start on tiZANidine and pantoprazole. Additionally, I am having him maintain his hydrochlorothiazide, glucose blood, cyclobenzaprine, atorvastatin, metFORMIN, losartan-hydrochlorothiazide, amLODipine, and carvedilol.  Meds ordered this encounter  Medications  . tiZANidine (ZANAFLEX) 2 MG tablet    Sig: Take 0.5-2 tablets (1-4 mg total) by mouth 2 (two) times daily as needed for muscle spasms.    Dispense:  30 tablet    Refill:  2  . pantoprazole (PROTONIX) 40 MG tablet    Sig: Take 1 tablet (40 mg total) by mouth daily.    Dispense:  90 tablet    Refill:  1     Penni Homans, MD

## 2020-06-09 NOTE — Assessment & Plan Note (Signed)
His father died suddenly 2 weeks ago after a brief illness with cancer, he has returned to work but it has been difficult but he is managing things ok mostly. He will let us know if things need to be address

## 2020-06-09 NOTE — Assessment & Plan Note (Signed)
hgba1c acceptable, minimize simple carbs. Increase exercise as tolerated. Continue current meds 

## 2020-06-09 NOTE — Assessment & Plan Note (Signed)
Referred to gastroenterology for screening colonoscopy

## 2020-06-09 NOTE — Assessment & Plan Note (Signed)
Encouraged moist heat and gentle stretching as tolerated. May try NSAIDs and prescription meds as directed and report if symptoms worsen or seek immediate care. Tizanidine 1-4 mg po bid prn, xray of neck ordered and consider chiropractic and/or PT care.

## 2020-06-09 NOTE — Assessment & Plan Note (Signed)
Encouraged heart healthy diet, increase exercise, avoid trans fats, consider a krill oil cap daily 

## 2020-06-09 NOTE — Assessment & Plan Note (Signed)
Well controlled, no changes to meds. Encouraged heart healthy diet such as the DASH diet and exercise as tolerated.  °

## 2020-06-10 LAB — CBC
HCT: 46 % (ref 38.5–50.0)
Hemoglobin: 16 g/dL (ref 13.2–17.1)
MCH: 30.8 pg (ref 27.0–33.0)
MCHC: 34.8 g/dL (ref 32.0–36.0)
MCV: 88.6 fL (ref 80.0–100.0)
MPV: 9.9 fL (ref 7.5–12.5)
Platelets: 217 10*3/uL (ref 140–400)
RBC: 5.19 10*6/uL (ref 4.20–5.80)
RDW: 13.1 % (ref 11.0–15.0)
WBC: 7 10*3/uL (ref 3.8–10.8)

## 2020-06-10 LAB — COMPREHENSIVE METABOLIC PANEL
AG Ratio: 1.8 (calc) (ref 1.0–2.5)
ALT: 28 U/L (ref 9–46)
AST: 23 U/L (ref 10–35)
Albumin: 4.4 g/dL (ref 3.6–5.1)
Alkaline phosphatase (APISO): 55 U/L (ref 35–144)
BUN: 15 mg/dL (ref 7–25)
CO2: 29 mmol/L (ref 20–32)
Calcium: 9.6 mg/dL (ref 8.6–10.3)
Chloride: 102 mmol/L (ref 98–110)
Creat: 1.19 mg/dL (ref 0.70–1.33)
Globulin: 2.4 g/dL (calc) (ref 1.9–3.7)
Glucose, Bld: 172 mg/dL — ABNORMAL HIGH (ref 65–99)
Potassium: 4.1 mmol/L (ref 3.5–5.3)
Sodium: 139 mmol/L (ref 135–146)
Total Bilirubin: 0.7 mg/dL (ref 0.2–1.2)
Total Protein: 6.8 g/dL (ref 6.1–8.1)

## 2020-06-10 LAB — HEMOGLOBIN A1C
Hgb A1c MFr Bld: 6.6 % of total Hgb — ABNORMAL HIGH (ref <5.7)
Mean Plasma Glucose: 143 (calc)
eAG (mmol/L): 7.9 (calc)

## 2020-06-10 LAB — TSH: TSH: 0.95 mIU/L (ref 0.40–4.50)

## 2020-06-10 LAB — LIPID PANEL
Cholesterol: 163 mg/dL (ref <200)
HDL: 38 mg/dL — ABNORMAL LOW (ref >=40)
LDL Cholesterol (Calc): 100 mg/dL (calc) — ABNORMAL HIGH
Non-HDL Cholesterol (Calc): 125 mg/dL (calc) (ref <130)
Total CHOL/HDL Ratio: 4.3 (calc) (ref <5.0)
Triglycerides: 146 mg/dL (ref <150)

## 2020-06-13 NOTE — Progress Notes (Signed)
Attempted to call pt and LVM to call back 

## 2020-08-23 ENCOUNTER — Telehealth: Payer: Self-pay | Admitting: Family Medicine

## 2020-08-23 ENCOUNTER — Other Ambulatory Visit: Payer: Self-pay | Admitting: Family Medicine

## 2020-08-23 DIAGNOSIS — I152 Hypertension secondary to endocrine disorders: Secondary | ICD-10-CM

## 2020-08-23 DIAGNOSIS — I10 Essential (primary) hypertension: Secondary | ICD-10-CM

## 2020-08-23 DIAGNOSIS — E1169 Type 2 diabetes mellitus with other specified complication: Secondary | ICD-10-CM

## 2020-08-23 DIAGNOSIS — E669 Obesity, unspecified: Secondary | ICD-10-CM

## 2020-08-23 DIAGNOSIS — E785 Hyperlipidemia, unspecified: Secondary | ICD-10-CM

## 2020-08-23 DIAGNOSIS — Z0000 Encounter for general adult medical examination without abnormal findings: Secondary | ICD-10-CM

## 2020-08-23 DIAGNOSIS — E119 Type 2 diabetes mellitus without complications: Secondary | ICD-10-CM

## 2020-08-23 MED ORDER — LOSARTAN POTASSIUM 100 MG PO TABS: 100.0000 mg | ORAL_TABLET | Freq: Every day | ORAL | 1 refills | Status: DC

## 2020-08-23 MED ORDER — HYDROCHLOROTHIAZIDE 25 MG PO TABS: 25.0000 mg | ORAL_TABLET | Freq: Every day | ORAL | 1 refills | Status: DC

## 2020-08-23 NOTE — Telephone Encounter (Signed)
thanks

## 2020-08-23 NOTE — Telephone Encounter (Signed)
Losartan-hctz combo on back order at [facility]. Separate scripts have been sent in. Just an FYI.

## 2020-08-27 ENCOUNTER — Encounter: Payer: Self-pay | Admitting: Family Medicine

## 2020-08-27 ENCOUNTER — Telehealth (INDEPENDENT_AMBULATORY_CARE_PROVIDER_SITE_OTHER): Payer: BLUE CROSS/BLUE SHIELD | Admitting: Family Medicine

## 2020-08-27 ENCOUNTER — Other Ambulatory Visit: Payer: Self-pay

## 2020-08-27 DIAGNOSIS — U071 COVID-19: Secondary | ICD-10-CM

## 2020-08-27 NOTE — Progress Notes (Signed)
      Zaneta Lightcap T. Rebeka Kimble, MD Primary Care and Sports Medicine [facility] [place], [address] Phone: [phone]  FAX: [phone]  Robert [name] - 50 y.o. male  MRN [id number]  Date of Birth: 01-Apr-1971  Visit Date: 08/27/2020  PCP: [name], MD  Referred by: [name], MD  Virtual Visit via Video Note:  I connected with  Robert [name] on 08/27/2020 10:00 AM EST by a video enabled telemedicine application and verified that I am speaking with the correct person using two identifiers.   Location patient: home computer, tablet, or smartphone Location provider: work or home office Consent: Verbal consent directly obtained from [name]. Persons participating in the virtual visit: patient, provider  I discussed the limitations of evaluation and management by telemedicine and the availability of in person appointments. The patient expressed understanding and agreed to proceed.  Chief complaint: COVID-19  History of Present Illness:  Covid-19, 1st day in 2 weeks feeling better.   Increased taste and smell Gas in GI Diarrhea  He is about 14 days into his confirmed case of COVID-19.  He continues to feel unwell, though this morning he does feel like he is improving somewhat.  His worst symptoms have been GI with gas as well as diarrhea.  He has had loose bowel movements now for 2 weeks.  He also has had a cough, and he has taken some Coricidin which is helped.  He also has had some heightened taste and smell sensations.  No loss.  Otherwise, he is not having any significant symptoms.  He is not having significant shortness of breath.  Review of Systems as above: See pertinent positives and pertinent negatives per HPI No acute distress verbally   Observations/Objective/Exam:  An attempt was made to discern vital signs over the phone and per patient if applicable and possible.   General:    Alert, Oriented,  appears well and in no acute distress  Pulmonary:     On inspection no signs of respiratory distress.  Psych / Neurological:     Pleasant and cooperative.  Assessment and Plan:    ICD-10-CM   1. COVID-19  U07.1    I reviewed COVID-19, answered all of his questions.  Thankfully his GI symptoms are improving today.  Regardless, I do not think that adding anything additional other than supportive care as needed.  Continue with liquids, bland diet and advance as tolerated.  I think the cough Coricidin is a reasonable choice, but this is all supportive.  The only other thing I could additionally think would be a as needed inhaler, but he is not really having any wheezing.  I discussed the assessment and treatment plan with the patient. The patient was provided an opportunity to ask questions and all were answered. The patient agreed with the plan and demonstrated an understanding of the instructions.   The patient was advised to call back or seek an in-person evaluation if the symptoms worsen or if the condition fails to improve as anticipated.  Signed,  Maud Deed. Chelcee Korpi, MD

## 2020-10-24 ENCOUNTER — Ambulatory Visit: Payer: BLUE CROSS/BLUE SHIELD | Admitting: Family Medicine

## 2020-10-25 ENCOUNTER — Other Ambulatory Visit: Payer: Self-pay | Admitting: Family Medicine

## 2020-10-25 DIAGNOSIS — I152 Hypertension secondary to endocrine disorders: Secondary | ICD-10-CM

## 2020-10-25 DIAGNOSIS — E669 Obesity, unspecified: Secondary | ICD-10-CM

## 2020-10-25 DIAGNOSIS — I10 Essential (primary) hypertension: Secondary | ICD-10-CM

## 2020-10-25 DIAGNOSIS — E785 Hyperlipidemia, unspecified: Secondary | ICD-10-CM

## 2020-10-25 DIAGNOSIS — Z0000 Encounter for general adult medical examination without abnormal findings: Secondary | ICD-10-CM

## 2020-10-25 DIAGNOSIS — E1169 Type 2 diabetes mellitus with other specified complication: Secondary | ICD-10-CM

## 2020-12-05 ENCOUNTER — Other Ambulatory Visit: Payer: BLUE CROSS/BLUE SHIELD

## 2020-12-05 ENCOUNTER — Ambulatory Visit: Payer: BLUE CROSS/BLUE SHIELD | Admitting: Family Medicine

## 2020-12-05 ENCOUNTER — Ambulatory Visit (HOSPITAL_BASED_OUTPATIENT_CLINIC_OR_DEPARTMENT_OTHER)
Admission: RE | Admit: 2020-12-05 | Discharge: 2020-12-05 | Disposition: A | Discharge status: Home / Self Care | Payer: BLUE CROSS/BLUE SHIELD | Source: Ambulatory Visit | Attending: Family Medicine | Admitting: Family Medicine

## 2020-12-05 ENCOUNTER — Encounter: Payer: Self-pay | Admitting: Family Medicine

## 2020-12-05 ENCOUNTER — Other Ambulatory Visit: Payer: Self-pay

## 2020-12-05 VITALS — BP 122/76 | HR 73 | Temp 98.3°F | Resp 16 | Wt 267.4 lb

## 2020-12-05 DIAGNOSIS — K921 Melena: Secondary | ICD-10-CM | POA: Diagnosis not present

## 2020-12-05 DIAGNOSIS — L600 Ingrowing nail: Secondary | ICD-10-CM

## 2020-12-05 DIAGNOSIS — E1169 Type 2 diabetes mellitus with other specified complication: Secondary | ICD-10-CM

## 2020-12-05 DIAGNOSIS — M542 Cervicalgia: Secondary | ICD-10-CM

## 2020-12-05 DIAGNOSIS — E669 Obesity, unspecified: Secondary | ICD-10-CM

## 2020-12-05 DIAGNOSIS — E785 Hyperlipidemia, unspecified: Secondary | ICD-10-CM

## 2020-12-05 DIAGNOSIS — M549 Dorsalgia, unspecified: Secondary | ICD-10-CM

## 2020-12-05 DIAGNOSIS — Z1211 Encounter for screening for malignant neoplasm of colon: Secondary | ICD-10-CM | POA: Diagnosis not present

## 2020-12-05 DIAGNOSIS — B070 Plantar wart: Secondary | ICD-10-CM | POA: Insufficient documentation

## 2020-12-05 DIAGNOSIS — K625 Hemorrhage of anus and rectum: Secondary | ICD-10-CM

## 2020-12-05 DIAGNOSIS — M25559 Pain in unspecified hip: Secondary | ICD-10-CM | POA: Diagnosis present

## 2020-12-05 DIAGNOSIS — M2550 Pain in unspecified joint: Secondary | ICD-10-CM

## 2020-12-05 DIAGNOSIS — I10 Essential (primary) hypertension: Secondary | ICD-10-CM

## 2020-12-05 NOTE — Assessment & Plan Note (Signed)
Has thoracic and lumbar pain worse on the left and pain radiates to left hip. Check Xrays of thoracic and lumbar spines and left hip. Stay as active as he can tolerate and consider MRI vs referral to ortho for further consideration.

## 2020-12-05 NOTE — Assessment & Plan Note (Signed)
Well controlled, no changes to meds. Encouraged heart healthy diet such as the DASH diet and exercise as tolerated.  °

## 2020-12-05 NOTE — Assessment & Plan Note (Signed)
Referred to gastroenterology for colonoscopy to further evaluate

## 2020-12-05 NOTE — Assessment & Plan Note (Signed)
With most pain on the left and radiates to shoulder at times. Cervical spine xray and consider MRI

## 2020-12-05 NOTE — Patient Instructions (Signed)
https://doi.[email or web address]">  Chronic Back Pain When back pain lasts longer than 3 months, it is called chronic back pain. The cause of your back pain may not be known. Some common causes include:  Wear and tear (degenerative disease) of the bones, ligaments, or disks in your back.  Inflammation and stiffness in your back (arthritis). People who have chronic back pain often go through certain periods in which the pain is more intense (flare-ups). Many people can learn to manage the pain with home care. Follow these instructions at home: Pay attention to any changes in your symptoms. Take these actions to help with your pain: Managing pain and stiffness  If directed, apply ice to the painful area. Your health care provider may recommend applying ice during the first 24-48 hours after a flare-up begins. To do this: ? Put ice in a plastic bag. ? Place a towel between your skin and the bag. ? Leave the ice on for 20 minutes, 2-3 times per day.  If directed, apply heat to the affected area as often as told by your health care provider. Use the heat source that your health care provider recommends, such as a moist heat pack or a heating pad. ? Place a towel between your skin and the heat source. ? Leave the heat on for 20-30 minutes. ? Remove the heat if your skin turns bright red. This is especially important if you are unable to feel pain, heat, or cold. You may have a greater risk of getting burned.  Try soaking in a warm tub.      Activity  Avoid bending and other activities that make the problem worse.  Maintain a proper position when standing or sitting: ? When standing, keep your upper back and neck straight, with your shoulders pulled back. Avoid slouching. ? When sitting, keep your back straight and relax your shoulders. Do not round your shoulders or pull them backward.  Do not sit or stand in one place for long periods of time.  Take brief periods of rest  throughout the day. This will reduce your pain. Resting in a lying or standing position is usually better than sitting to rest.  When you are resting for longer periods, mix in some mild activity or stretching between periods of rest. This will help to prevent stiffness and pain.  Get regular exercise. Ask your health care provider what activities are safe for you.  Do not lift anything that is heavier than 10 lb (4.5 kg), or the limit that you are told, until your health care provider says that it is safe. Always use proper lifting technique, which includes: ? Bending your knees. ? Keeping the load close to your body. ? Avoiding twisting.  Sleep on a firm mattress in a comfortable position. Try lying on your side with your knees slightly bent. If you lie on your back, put a pillow under your knees.   Medicines  Treatment may include medicines for pain and inflammation taken by mouth or applied to the skin, prescription pain medicine, or muscle relaxants. Take over-the-counter and prescription medicines only as told by your health care provider.  Ask your health care provider if the medicine prescribed to you: ? Requires you to avoid driving or using machinery. ? Can cause constipation. You may need to take these actions to prevent or treat constipation:  Drink enough fluid to keep your urine pale yellow.  Take over-the-counter or prescription medicines.  Eat foods that are high in fiber, such as   beans, whole grains, and fresh fruits and vegetables.  Limit foods that are high in fat and processed sugars, such as fried or sweet foods. General instructions  Do not use any products that contain nicotine or tobacco, such as cigarettes, e-cigarettes, and chewing tobacco. If you need help quitting, ask your health care provider.  Keep all follow-up visits as told by your health care provider. This is important. Contact a health care provider if:  You have pain that is not relieved with  rest or medicine.  Your pain gets worse, or you have new pain.  You have a high fever.  You have rapid weight loss.  You have trouble doing your normal activities. Get help right away if:  You have weakness or numbness in one or both of your legs or feet.  You have trouble controlling your bladder or your bowels.  You have severe back pain and have any of the following: ? Nausea or vomiting. ? Pain in your abdomen. ? Shortness of breath or you faint. Summary  Chronic back pain is back pain that lasts longer than 3 months.  When a flare-up begins, apply ice to the painful area for the first 24-48 hours.  Apply a moist heat pad or use a heating pad on the painful area as directed by your health care provider.  When you are resting for longer periods, mix in some mild activity or stretching between periods of rest. This will help to prevent stiffness and pain. This information is not intended to replace advice given to you by your health care provider. Make sure you discuss any questions you have with your health care provider. Document Revised: 09/09/2019 Document Reviewed: 09/09/2019 Elsevier Patient Education  2021 Elsevier Inc.  

## 2020-12-05 NOTE — Assessment & Plan Note (Signed)
Has a strong FH of RA with sister and mother both carrying diagnosis. Check labs including Rheumatoid Factor

## 2020-12-05 NOTE — Assessment & Plan Note (Signed)
Encouraged heart healthy diet, increase exercise, avoid trans fats, consider a krill oil cap daily 

## 2020-12-05 NOTE — Assessment & Plan Note (Signed)
Referred to podiatry for this and plantar warts

## 2020-12-05 NOTE — Progress Notes (Signed)
Subjective:    Patient ID: Robert [name], male    DOB: 06-29-1971, 50 y.o.   MRN: [id number]  Chief Complaint  Patient presents with  . Follow-up  . Hypertension  . Diabetes    Pt states that he has pain on left side of his body.    HPI Patient is in today for follow up on chronic medical concerns. No recent febrile illness or recent hospitalizations. No fall or injury recently. Is noting increase in neck and back pain and left hip pain. He is noting plantar warts ingrown toenails. Denies CP/palp/SOB/HA/congestion/fevers/GI or GU c/o. Taking meds as prescribed  Past Medical History:  Diagnosis Date  . Arthralgia 01/11/2013   Diffuse, b/l Hips R>L Knees, ankles, low back  . Back pain 12/08/2015  . Diabetes mellitus type 2 in obese ([place]) 11/09/2012  . Dyslipidemia 11/09/2012  . Esophageal reflux 12/08/2015  . History of migraine headaches   . Hyperglycemia 11/09/2012  . Hypertension     Past Surgical History:  Procedure Laterality Date  . NO PAST SURGERIES      Family History  Problem Relation Age of Onset  . Arthritis Mother   . Hypertension Mother   . Diabetes Mother   . Cancer Mother   . Arthritis Father   . Hypertension Father   . Diabetes Father   . Sleep apnea Father   . Obesity Father   . Arthritis Maternal Grandmother   . Diabetes Maternal Grandmother   . Stroke Maternal Grandmother   . Arthritis Paternal Grandmother   . Diabetes Paternal Grandmother   . Cancer Paternal Grandmother        lung, smoker  . Diabetes Paternal Grandfather   . Heart disease Paternal Grandfather 81       MI  . Hyperlipidemia Brother   . Hypertension Brother   . Diabetes Brother   . COPD Maternal Grandfather   . Kidney disease Neg Hx     Social History   Socioeconomic History  . Marital status: Married    Spouse name: Not on file  . Number of children: 4  . Years of education: Not on file  . Highest education level: Not on file  Occupational History  . Occupation:  Software engineer: [employer]  Tobacco Use  . Smoking status: Former Smoker    Years: 9.00    Types: Cigarettes    Quit date: 08/14/1995    Years since quitting: 25.3  . Smokeless tobacco: Never Used  Vaping Use  . Vaping Use: Never used  Substance and Sexual Activity  . Alcohol use: Yes    Alcohol/week: 6.0 - 10.0 standard drinks    Types: 6 - 10 Cans of beer per week  . Drug use: No  . Sexual activity: Yes    Comment: lives with wife and daughter, no dietary restrictions  Other Topics Concern  . 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 amLODipine (NORVASC) 5 MG tablet TAKE 1 TABLET BY MOUTH EVERY DAY 90 tablet 1  . carvedilol (COREG) 12.5 MG tablet TAKE 1 TABLET BY MOUTH TWICE DAILY WITH A MEAL 180 tablet 1  . glucose blood (COOL BLOOD GLUCOSE TEST STRIPS) test strip Check blood sugar once daily 100 each 12  . losartan (COZAAR) 100 MG tablet Take 1 tablet (100 mg total) by mouth daily. 90 tablet 1  . metFORMIN (GLUCOPHAGE) 500 MG tablet TAKE 1 TABLET BY MOUTH EVERY DAY WITH BREAKFAST 90 tablet 1  . tiZANidine (ZANAFLEX) 2 MG tablet Take 0.5-2 tablets (1-4 mg total) by mouth 2 (two) times daily as needed for muscle spasms. 30 tablet 2  . atorvastatin (LIPITOR) 10 MG tablet Take 1 tablet (10 mg total) by mouth daily. (Patient not taking: Reported on 12/05/2020) 30 tablet 3  . hydrochlorothiazide (HYDRODIURIL) 25 MG  tablet Take 1 tablet (25 mg total) by mouth daily. (Patient not taking: Reported on 12/05/2020) 90 tablet 1  . cyclobenzaprine (FLEXERIL) 10 MG tablet TAKE ONE TABLET BY MOUTH TWICE A DAY AS NEEDED FOR MUSCLE SPASMS. 40 tablet 0  . pantoprazole (PROTONIX) 40 MG tablet Take 1 tablet (40 mg total) by mouth daily. 90 tablet 1   No facility-administered medications prior to visit.    Allergies  Allergen Reactions  . Mucinex [Guaifenesin Er] Shortness Of Breath, Itching and Dermatitis    Swelling of hands and feet  . Nsaids     Aleve, Advil cause swelling, itching and rash  . Latex Rash    Review of Systems  Constitutional: Positive for malaise/fatigue. Negative for fever.  HENT: Negative for congestion.   Eyes: Negative for blurred vision.  Respiratory: Negative for shortness of breath.   Cardiovascular: Negative for chest pain, palpitations and leg swelling.  Gastrointestinal: Negative for abdominal pain, blood in stool and nausea.  Genitourinary: Negative for dysuria and frequency.  Musculoskeletal: Positive for back pain, joint pain and neck pain. Negative for falls.  Skin: Negative for rash.  Neurological: Negative for dizziness, loss of consciousness and headaches.  Endo/Heme/Allergies: Negative for environmental allergies.  Psychiatric/Behavioral: Negative for depression. The patient is not nervous/anxious.        Objective:    Physical Exam Vitals and nursing note reviewed.  Constitutional:      General: He is not in acute distress.    Appearance: He is well-developed.  HENT:     Head: Normocephalic and atraumatic.     Nose: Nose normal.  Eyes:     General:        Right eye: No discharge.        Left eye: No discharge.  Cardiovascular:     Rate and Rhythm: Normal rate and regular rhythm.     Heart sounds: No murmur heard.   Pulmonary:     Effort: Pulmonary effort is normal.     Breath sounds: Normal breath sounds.  Abdominal:     General: Bowel sounds are  normal.     Palpations: Abdomen is soft.     Tenderness: There is no abdominal tenderness.  Musculoskeletal:     Cervical back: Normal range of motion and neck supple.  Skin:    General: Skin is warm and dry.  Neurological:     Mental Status: He is alert and oriented to person, place, and  time.     BP 122/76   Pulse 73   Temp 98.3 F (36.8 C)   Resp 16   Wt 267 lb 6.4 oz (121.3 kg)   SpO2 96%   BMI 33.42 kg/m  Wt Readings from Last 3 Encounters:  12/05/20 267 lb 6.4 oz (121.3 kg)  06/09/20 275 lb 3.2 oz (124.8 kg)  02/05/20 269 lb 3.2 oz (122.1 kg)    Diabetic Foot Exam - Simple   No data filed    Lab Results  Component Value Date   WBC 7.0 06/09/2020   HGB 16.0 06/09/2020   HCT 46.0 06/09/2020   PLT 217 06/09/2020   GLUCOSE 172 (H) 06/09/2020   CHOL 163 06/09/2020   TRIG 146 06/09/2020   HDL 38 (L) 06/09/2020   LDLCALC 100 (H) 06/09/2020   ALT 28 06/09/2020   AST 23 06/09/2020   NA 139 06/09/2020   K 4.1 06/09/2020   CL 102 06/09/2020   CREATININE 1.19 06/09/2020   BUN 15 06/09/2020   CO2 29 06/09/2020   TSH 0.95 06/09/2020   PSA 0.51 02/05/2020   HGBA1C 6.6 (H) 06/09/2020    Lab Results  Component Value Date   TSH 0.95 06/09/2020   Lab Results  Component Value Date   WBC 7.0 06/09/2020   HGB 16.0 06/09/2020   HCT 46.0 06/09/2020   MCV 88.6 06/09/2020   PLT 217 06/09/2020   Lab Results  Component Value Date   NA 139 06/09/2020   K 4.1 06/09/2020   CO2 29 06/09/2020   GLUCOSE 172 (H) 06/09/2020   BUN 15 06/09/2020   CREATININE 1.19 06/09/2020   BILITOT 0.7 06/09/2020   ALKPHOS 55 02/05/2020   AST 23 06/09/2020   ALT 28 06/09/2020   PROT 6.8 06/09/2020   ALBUMIN 4.4 02/05/2020   CALCIUM 9.6 06/09/2020   GFR 75.31 02/05/2020   Lab Results  Component Value Date   CHOL 163 06/09/2020   Lab Results  Component Value Date   HDL 38 (L) 06/09/2020   Lab Results  Component Value Date   LDLCALC 100 (H) 06/09/2020   Lab Results   Component Value Date   TRIG 146 06/09/2020   Lab Results  Component Value Date   CHOLHDL 4.3 06/09/2020   Lab Results  Component Value Date   HGBA1C 6.6 (H) 06/09/2020       Assessment & Plan:   Problem List Items Addressed This Visit    HTN (hypertension)    Well controlled, no changes to meds. Encouraged heart healthy diet such as the DASH diet and exercise as tolerated.       Relevant Orders   CBC   Comprehensive metabolic panel   TSH   Colon cancer screening   Relevant Orders   Ambulatory referral to Gastroenterology   Dyslipidemia    Encouraged heart healthy diet, increase exercise, avoid trans fats, consider a krill oil cap daily      Relevant Orders   Lipid panel   Diabetes mellitus type 2 in obese ([facility])    hgba1c acceptable, minimize simple carbs. Increase exercise as tolerated. Continue current meds      Relevant Orders   Hemoglobin A1c   Arthralgia    Has a strong FH of RA with sister and mother both carrying diagnosis. Check labs including Rheumatoid Factor      Back pain    Has thoracic and lumbar pain worse on the left and pain radiates to left hip. Check  Xrays of thoracic and lumbar spines and left hip. Stay as active as he can tolerate and consider MRI vs referral to ortho for further consideration.      Relevant Orders   DG Thoracic Spine 2 View   DG Lumbar Spine 2-3 Views   Rheumatoid Factor   Sedimentation rate   Antinuclear Antib (ANA)   Ingrown toenail of both feet    Referred to podiatry for this and plantar warts      Relevant Orders   Ambulatory referral to Podiatry   Rectal bleeding    Referred to gastroenterology for colonoscopy to further evaluate      Neck pain    With most pain on the left and radiates to shoulder at times. Cervical spine xray and consider MRI      Relevant Orders   DG Cervical Spine Complete   Plantar wart of left foot   Relevant Orders   Ambulatory referral to Podiatry   Blood in stool - Primary    Relevant Orders   Ambulatory referral to Gastroenterology    Other Visit Diagnoses    Hip pain       Relevant Orders   DG Hip Unilat W OR W/O Pelvis 2-3 Views Left   Rheumatoid Factor   Sedimentation rate   Antinuclear Antib (ANA)      I have discontinued Oda Clift's cyclobenzaprine and pantoprazole. I am also having him maintain his glucose blood, atorvastatin, tiZANidine, metFORMIN, amLODipine, losartan, hydrochlorothiazide, and carvedilol.  No orders of the defined types were placed in this encounter.    Penni Homans, MD

## 2020-12-05 NOTE — Assessment & Plan Note (Signed)
hgba1c acceptable, minimize simple carbs. Increase exercise as tolerated. Continue current meds 

## 2020-12-06 ENCOUNTER — Other Ambulatory Visit (INDEPENDENT_AMBULATORY_CARE_PROVIDER_SITE_OTHER): Payer: BLUE CROSS/BLUE SHIELD

## 2020-12-06 DIAGNOSIS — I10 Essential (primary) hypertension: Secondary | ICD-10-CM

## 2020-12-06 DIAGNOSIS — E785 Hyperlipidemia, unspecified: Secondary | ICD-10-CM

## 2020-12-06 DIAGNOSIS — M549 Dorsalgia, unspecified: Secondary | ICD-10-CM

## 2020-12-06 DIAGNOSIS — E669 Obesity, unspecified: Secondary | ICD-10-CM

## 2020-12-06 DIAGNOSIS — E1169 Type 2 diabetes mellitus with other specified complication: Secondary | ICD-10-CM | POA: Diagnosis not present

## 2020-12-06 DIAGNOSIS — M25559 Pain in unspecified hip: Secondary | ICD-10-CM | POA: Diagnosis not present

## 2020-12-06 LAB — CBC
HCT: 45.5 % (ref 39.0–52.0)
Hemoglobin: 15.5 g/dL (ref 13.0–17.0)
MCHC: 34.2 g/dL (ref 30.0–36.0)
MCV: 88.7 fl (ref 78.0–100.0)
Platelets: 196 10*3/uL (ref 150.0–400.0)
RBC: 5.13 Mil/uL (ref 4.22–5.81)
RDW: 14 % (ref 11.5–15.5)
WBC: 5.6 10*3/uL (ref 4.0–10.5)

## 2020-12-06 LAB — COMPREHENSIVE METABOLIC PANEL
ALT: 22 U/L (ref 0–53)
AST: 20 U/L (ref 0–37)
Albumin: 4.1 g/dL (ref 3.5–5.2)
Alkaline Phosphatase: 56 U/L (ref 39–117)
BUN: 11 mg/dL (ref 6–23)
CO2: 28 mEq/L (ref 19–32)
Calcium: 9.6 mg/dL (ref 8.4–10.5)
Chloride: 105 mEq/L (ref 96–112)
Creatinine, Ser: 0.98 mg/dL (ref 0.40–1.50)
GFR: 89.11 mL/min (ref >60.00)
Glucose, Bld: 111 mg/dL — ABNORMAL HIGH (ref 70–99)
Potassium: 4.5 mEq/L (ref 3.5–5.1)
Sodium: 140 mEq/L (ref 135–145)
Total Bilirubin: 0.7 mg/dL (ref 0.2–1.2)
Total Protein: 6.7 g/dL (ref 6.0–8.3)

## 2020-12-06 LAB — LIPID PANEL
Cholesterol: 151 mg/dL (ref 0–200)
HDL: 39.3 mg/dL (ref >39.00)
LDL Cholesterol: 93 mg/dL (ref 0–99)
NonHDL: 111.87
Total CHOL/HDL Ratio: 4
Triglycerides: 95 mg/dL (ref 0.0–149.0)
VLDL: 19 mg/dL (ref 0.0–40.0)

## 2020-12-06 LAB — HEMOGLOBIN A1C: Hgb A1c MFr Bld: 6.5 % (ref 4.6–6.5)

## 2020-12-06 LAB — SEDIMENTATION RATE: Sed Rate: 4 mm/hr (ref 0–20)

## 2020-12-06 LAB — TSH: TSH: 0.71 u[IU]/mL (ref 0.35–4.50)

## 2020-12-09 ENCOUNTER — Encounter: Payer: Self-pay | Admitting: *Deleted

## 2020-12-09 ENCOUNTER — Other Ambulatory Visit: Payer: Self-pay | Admitting: Family Medicine

## 2020-12-09 DIAGNOSIS — M2550 Pain in unspecified joint: Secondary | ICD-10-CM

## 2020-12-09 DIAGNOSIS — R7689 Other specified abnormal immunological findings in serum: Secondary | ICD-10-CM

## 2020-12-09 DIAGNOSIS — R768 Other specified abnormal immunological findings in serum: Secondary | ICD-10-CM

## 2020-12-09 LAB — ANA: Anti Nuclear Antibody (ANA): POSITIVE — AB

## 2020-12-09 LAB — ANTI-NUCLEAR AB-TITER (ANA TITER): ANA Titer 1: 1:320 {titer} — ABNORMAL HIGH

## 2020-12-09 LAB — RHEUMATOID FACTOR: Rheumatoid fact SerPl-aCnc: <14 IU/mL (ref <14)

## 2021-01-17 ENCOUNTER — Encounter: Payer: Self-pay | Admitting: Family Medicine

## 2021-01-20 ENCOUNTER — Encounter: Payer: Self-pay | Admitting: Family Medicine

## 2021-01-20 ENCOUNTER — Other Ambulatory Visit: Payer: Self-pay

## 2021-01-20 DIAGNOSIS — E1169 Type 2 diabetes mellitus with other specified complication: Secondary | ICD-10-CM

## 2021-01-20 DIAGNOSIS — I10 Essential (primary) hypertension: Secondary | ICD-10-CM

## 2021-01-20 DIAGNOSIS — E785 Hyperlipidemia, unspecified: Secondary | ICD-10-CM

## 2021-01-20 DIAGNOSIS — E669 Obesity, unspecified: Secondary | ICD-10-CM

## 2021-01-20 DIAGNOSIS — I152 Hypertension secondary to endocrine disorders: Secondary | ICD-10-CM

## 2021-01-20 DIAGNOSIS — Z0000 Encounter for general adult medical examination without abnormal findings: Secondary | ICD-10-CM

## 2021-01-20 MED ORDER — AMLODIPINE BESYLATE 5 MG PO TABS: 1.0000 | ORAL_TABLET | Freq: Every day | ORAL | 1 refills | Status: DC

## 2021-01-20 MED ORDER — METFORMIN HCL 500 MG PO TABS: ORAL_TABLET | ORAL | 1 refills | Status: DC

## 2021-01-20 MED ORDER — LOSARTAN POTASSIUM 100 MG PO TABS: 100.0000 mg | ORAL_TABLET | Freq: Every day | ORAL | 1 refills | Status: DC

## 2021-04-13 ENCOUNTER — Encounter: Payer: Self-pay | Admitting: Family Medicine

## 2021-04-13 ENCOUNTER — Ambulatory Visit: Payer: BLUE CROSS/BLUE SHIELD | Admitting: Family Medicine

## 2021-04-13 ENCOUNTER — Other Ambulatory Visit: Payer: Self-pay

## 2021-04-13 VITALS — BP 132/94 | HR 51 | Temp 97.6°F | Resp 16 | Wt 275.6 lb

## 2021-04-13 DIAGNOSIS — E1169 Type 2 diabetes mellitus with other specified complication: Secondary | ICD-10-CM | POA: Diagnosis not present

## 2021-04-13 DIAGNOSIS — K219 Gastro-esophageal reflux disease without esophagitis: Secondary | ICD-10-CM

## 2021-04-13 DIAGNOSIS — M79642 Pain in left hand: Secondary | ICD-10-CM

## 2021-04-13 DIAGNOSIS — I10 Essential (primary) hypertension: Secondary | ICD-10-CM | POA: Diagnosis not present

## 2021-04-13 DIAGNOSIS — R194 Change in bowel habit: Secondary | ICD-10-CM | POA: Diagnosis not present

## 2021-04-13 DIAGNOSIS — E669 Obesity, unspecified: Secondary | ICD-10-CM

## 2021-04-13 DIAGNOSIS — E785 Hyperlipidemia, unspecified: Secondary | ICD-10-CM | POA: Diagnosis not present

## 2021-04-13 DIAGNOSIS — M79641 Pain in right hand: Secondary | ICD-10-CM

## 2021-04-13 DIAGNOSIS — M542 Cervicalgia: Secondary | ICD-10-CM

## 2021-04-13 DIAGNOSIS — K921 Melena: Secondary | ICD-10-CM

## 2021-04-13 DIAGNOSIS — M2550 Pain in unspecified joint: Secondary | ICD-10-CM

## 2021-04-13 LAB — COMPREHENSIVE METABOLIC PANEL
ALT: 24 U/L (ref 0–53)
AST: 21 U/L (ref 0–37)
Albumin: 4.5 g/dL (ref 3.5–5.2)
Alkaline Phosphatase: 67 U/L (ref 39–117)
BUN: 12 mg/dL (ref 6–23)
CO2: 29 mEq/L (ref 19–32)
Calcium: 9.8 mg/dL (ref 8.4–10.5)
Chloride: 103 mEq/L (ref 96–112)
Creatinine, Ser: 1.16 mg/dL (ref 0.40–1.50)
GFR: 72.6 mL/min (ref >60.00)
Glucose, Bld: 128 mg/dL — ABNORMAL HIGH (ref 70–99)
Potassium: 4.5 mEq/L (ref 3.5–5.1)
Sodium: 139 mEq/L (ref 135–145)
Total Bilirubin: 0.7 mg/dL (ref 0.2–1.2)
Total Protein: 7 g/dL (ref 6.0–8.3)

## 2021-04-13 LAB — CBC
HCT: 46.7 % (ref 39.0–52.0)
Hemoglobin: 15.7 g/dL (ref 13.0–17.0)
MCHC: 33.7 g/dL (ref 30.0–36.0)
MCV: 90 fl (ref 78.0–100.0)
Platelets: 209 10*3/uL (ref 150.0–400.0)
RBC: 5.19 Mil/uL (ref 4.22–5.81)
RDW: 14.7 % (ref 11.5–15.5)
WBC: 6.3 10*3/uL (ref 4.0–10.5)

## 2021-04-13 LAB — LIPID PANEL
Cholesterol: 170 mg/dL (ref 0–200)
HDL: 41.9 mg/dL (ref >39.00)
LDL Cholesterol: 108 mg/dL — ABNORMAL HIGH (ref 0–99)
NonHDL: 127.73
Total CHOL/HDL Ratio: 4
Triglycerides: 99 mg/dL (ref 0.0–149.0)
VLDL: 19.8 mg/dL (ref 0.0–40.0)

## 2021-04-13 LAB — TSH: TSH: 0.8 u[IU]/mL (ref 0.35–5.50)

## 2021-04-13 LAB — HEMOGLOBIN A1C: Hgb A1c MFr Bld: 6.8 % — ABNORMAL HIGH (ref 4.6–6.5)

## 2021-04-13 MED ORDER — TIZANIDINE HCL 2 MG PO TABS: 1.0000 mg | ORAL_TABLET | Freq: Two times a day (BID) | ORAL | 5 refills | Status: DC | PRN

## 2021-04-13 NOTE — Assessment & Plan Note (Signed)
He has been referred to gastroenterology twice but life complications have kept him from going. His episodes of bleeding are less frequent now but still occur roughly monthly and he describes alternating from loose to hard stool. He is referred back for colonoscopy and further consideration

## 2021-04-13 NOTE — Progress Notes (Signed)
Patient ID: Robert [name], male    DOB: [birth year]  Age: 50 y.o. MRN: [id number]    Subjective:   Chief Complaint  Patient presents with   3 months follow up    Hypertension    Subjective  HPI Robert [name] presents for office visit today for follow up on HTN and Arthralgia. He reports that he tested negative for RA at his rheumatology visit. However, he was told that his osteoarthritis in his lower back is more advanced for his age which he expresses concern about. He has his good and bad days due to his pain. He states that it is worse in the morning and that it is primarily local to his lower back. He also experiences pain in bilateral shoulders and arms. He experiences numbness in bilateral hands as well. He states that he has trigger fingers bilaterally in 3rd and 4th fingers. Denies CP/palp/SOB/HA/congestion/fevers/diarrhea/constipation or GU c/o. Taking meds as prescribed.  He is still struggling with blood in stool and reports that within the last month it has happened for 5-9 times. He endorses avoiding processed meat and primarily has more protein in his diet while reducing his carbohydrates intake. He also experiences bloating with certain foods he eats. He incorporates yogurt, kombucha, and probiotics whenever he is experiencing bloating. He found success with alternating those 3 things.   Review of Systems  Constitutional:  Negative for chills, fatigue and fever.  HENT:  Negative for congestion, rhinorrhea, sinus pressure, sinus pain and sore throat.   Eyes:  Negative for pain and visual disturbance.  Respiratory:  Negative for cough and shortness of breath.   Cardiovascular:  Negative for chest pain, palpitations and leg swelling.  Gastrointestinal:  Positive for blood in stool. Negative for abdominal pain, constipation, diarrhea, nausea and vomiting.  Genitourinary:  Negative for flank pain, frequency and penile pain.  Musculoskeletal:  Positive for arthralgias and back pain.   Neurological:  Positive for numbness (bilateral wrists). Negative for headaches.   History Past Medical History:  Diagnosis Date   Arthralgia 01/11/2013   Diffuse, b/l Hips R>L Knees, ankles, low back   Back pain 12/08/2015   Diabetes mellitus type 2 in obese ([place]) 11/09/2012   Dyslipidemia 11/09/2012   Esophageal reflux 12/08/2015   History of migraine headaches    Hyperglycemia 11/09/2012   Hypertension     He has a past surgical history that includes No past surgeries.   His family history includes Arthritis in his father, maternal grandmother, mother, and paternal grandmother; COPD in his maternal grandfather; Cancer in his mother and paternal grandmother; Diabetes in his brother, father, maternal grandmother, mother, paternal grandfather, and paternal grandmother; Heart disease (age of onset: 16) in his paternal grandfather; Hyperlipidemia in his brother; Hypertension in his brother, father, and mother; Obesity in his father; Sleep apnea in his father; Stroke in his maternal grandmother.He reports that he quit smoking about 25 years ago. His smoking use included cigarettes. He has never used smokeless tobacco. He reports current alcohol use of about 6.0 - 10.0 standard drinks per week. He reports that he does not use drugs.  Current Outpatient Medications on File Prior to Visit  Medication Sig Dispense Refill   amLODipine (NORVASC) 5 MG tablet Take 1 tablet (5 mg total) by mouth daily. 90 tablet 1   atorvastatin (LIPITOR) 10 MG tablet Take 1 tablet (10 mg total) by mouth daily. (Patient not taking: Reported on 12/05/2020) 30 tablet 3   carvedilol (COREG) 12.5 MG tablet TAKE 1 TABLET  BY MOUTH TWICE DAILY WITH A MEAL 180 tablet 1   glucose blood (COOL BLOOD GLUCOSE TEST STRIPS) test strip Check blood sugar once daily 100 each 12   hydrochlorothiazide (HYDRODIURIL) 25 MG tablet Take 1 tablet (25 mg total) by mouth daily. (Patient not taking: Reported on 12/05/2020) 90 tablet 1   losartan  (COZAAR) 100 MG tablet Take 1 tablet (100 mg total) by mouth daily. 90 tablet 1   metFORMIN (GLUCOPHAGE) 500 MG tablet TAKE 1 TABLET BY MOUTH EVERY DAY WITH BREAKFAST 90 tablet 1   No current facility-administered medications on file prior to visit.     Objective:  Objective  Physical Exam Constitutional:      General: He is not in acute distress.    Appearance: Normal appearance. He is not ill-appearing or toxic-appearing.  HENT:     Head: Normocephalic and atraumatic.     Right Ear: Tympanic membrane, ear canal and external ear normal.     Left Ear: Tympanic membrane, ear canal and external ear normal.     Nose: No congestion or rhinorrhea.  Eyes:     Extraocular Movements: Extraocular movements intact.     Pupils: Pupils are equal, round, and reactive to light.  Cardiovascular:     Rate and Rhythm: Normal rate and regular rhythm.     Pulses: Normal pulses.     Heart sounds: Normal heart sounds. No murmur heard. Pulmonary:     Effort: Pulmonary effort is normal. No respiratory distress.     Breath sounds: Normal breath sounds. No wheezing, rhonchi or rales.  Abdominal:     General: Bowel sounds are normal.     Palpations: Abdomen is soft. There is no mass.     Tenderness: no abdominal tenderness There is no guarding.     Hernia: No hernia is present.  Musculoskeletal:        General: Normal range of motion.     Cervical back: Normal range of motion and neck supple.  Skin:    General: Skin is warm and dry.  Neurological:     Mental Status: He is alert and oriented to person, place, and time.  Psychiatric:        Behavior: Behavior normal.   BP (!) 132/94   Pulse (!) 51   Temp 97.6 F (36.4 C)   Resp 16   Wt 275 lb 9.6 oz (125 kg)   SpO2 99%   BMI 34.45 kg/m  Wt Readings from Last 3 Encounters:  04/13/21 275 lb 9.6 oz (125 kg)  12/05/20 267 lb 6.4 oz (121.3 kg)  06/09/20 275 lb 3.2 oz (124.8 kg)     Lab Results  Component Value Date   WBC 5.6 12/06/2020    HGB 15.5 12/06/2020   HCT 45.5 12/06/2020   PLT 196.0 12/06/2020   GLUCOSE 111 (H) 12/06/2020   CHOL 151 12/06/2020   TRIG 95.0 12/06/2020   HDL 39.30 12/06/2020   LDLCALC 93 12/06/2020   ALT 22 12/06/2020   AST 20 12/06/2020   NA 140 12/06/2020   K 4.5 12/06/2020   CL 105 12/06/2020   CREATININE 0.98 12/06/2020   BUN 11 12/06/2020   CO2 28 12/06/2020   TSH 0.71 12/06/2020   PSA 0.51 02/05/2020   HGBA1C 6.5 12/06/2020    DG Cervical Spine Complete  Result Date: 12/06/2020 CLINICAL DATA:  Neck pain.  No known injury. EXAM: CERVICAL SPINE - COMPLETE 4+ VIEW COMPARISON:  06/09/2020 FINDINGS: Stable straightening of the normal  cervical lordosis. Stable mild to moderate anterior spur formation at the C3-4, C4-5 and C5-6 levels and moderate anterior spur formation at the C6-7 level. Moderate to marked disc space narrowing at the C6-7 level with progression. There is also progressive disc space narrowing at the C5-6 level. Uncinate spurs producing moderate foraminal stenosis on the right at the C6-7 level. Uncinate and facet spurs producing moderate foraminal stenosis on the left at the C3-4 level and mild to moderate foraminal stenosis on the left at the C5-6 level. Uncinate spurs producing moderate foraminal stenosis on the left at C6-7 level. IMPRESSION: Multilevel degenerative changes, as described above. Electronically Signed   By: Claudie Revering M.D.   On: 12/06/2020 17:31   DG Thoracic Spine 2 View  Result Date: 12/06/2020 CLINICAL DATA:  Thoracic back pain.  No known injury. EXAM: THORACIC SPINE 2 VIEWS COMPARISON:  None. FINDINGS: Mild to moderate anterior and lateral spur formation at multiple levels. No fractures or subluxations. IMPRESSION: Multilevel degenerative changes. Electronically Signed   By: Claudie Revering M.D.   On: 12/06/2020 17:32   DG Lumbar Spine 2-3 Views  Result Date: 12/06/2020 CLINICAL DATA:  Low back and left hip pain.  No known injury. EXAM: LUMBAR SPINE - 2-3 VIEW  COMPARISON:  None. FINDINGS: Six non-rib-bearing lumbar vertebrae. Mild and mild to moderate anterior and lateral spur formation throughout the lumbar and lower thoracic spine. No fractures, pars defects or subluxations. IMPRESSION: Multilevel degenerative changes. Electronically Signed   By: Claudie Revering M.D.   On: 12/06/2020 17:33   DG Hip Unilat W OR W/O Pelvis 2-3 Views Left  Result Date: 12/06/2020 CLINICAL DATA:  Left hip pain.  No known injury. EXAM: DG HIP (WITH OR WITHOUT PELVIS) 2-3V LEFT COMPARISON:  None. FINDINGS: Normal appearing left hip. Mild lower lumbar spine degenerative changes. IMPRESSION: Normal left hip. Electronically Signed   By: Claudie Revering M.D.   On: 12/06/2020 17:34     Assessment & Plan:  Plan    Meds ordered this encounter  Medications   tiZANidine (ZANAFLEX) 2 MG tablet    Sig: Take 0.5-2 tablets (1-4 mg total) by mouth 2 (two) times daily as needed for muscle spasms.    Dispense:  30 tablet    Refill:  5     Problem List Items Addressed This Visit     HTN (hypertension) - Primary    Well controlled, no changes to meds. Encouraged heart healthy diet such as the DASH diet and exercise as tolerated.       Relevant Orders   CBC   Comprehensive metabolic panel   TSH   Dyslipidemia   Relevant Orders   Lipid panel   Diabetes mellitus type 2 in obese ([facility])    hgba1c acceptable, minimize simple carbs. Increase exercise as tolerated. Continue current meds      Relevant Orders   Hemoglobin A1c   Arthralgia    Positive ANA but evaluated by Rheumatology and they believe his pain is advanced for age but largely from osteoarthritis. Encouraged to eat a low inflammation diet and to stay as active as able, hydrate well.       Esophageal reflux   Relevant Orders   Ambulatory referral to Gastroenterology   Neck pain   Relevant Medications   tiZANidine (ZANAFLEX) 2 MG tablet   Bilateral hand pain    He notes pain and trigger finger in 3rd and 4 th  fingers in right hand. Keep hand moving and use topical  rubs and monitor      Blood in stool    He has been referred to gastroenterology twice but life complications have kept him from going. His episodes of bleeding are less frequent now but still occur roughly monthly and he describes alternating from loose to hard stool. He is referred back for colonoscopy and further consideration      Relevant Orders   Ambulatory referral to Gastroenterology   Other Visit Diagnoses     Change in bowel habits       Relevant Orders   CBC   Ambulatory referral to Gastroenterology       Follow-up: Return in about 6 months (around 10/11/2021) for annual exam.  I, Suezanne Jacquet, acting as a scribe for Penni Homans, MD, have documented all relevent documentation on behalf of Penni Homans, MD, as directed by Penni Homans, MD while in the presence of Penni Homans, MD.  I, [name], MD personally performed the services described in this documentation. All medical record entries made by the scribe were at my direction and in my presence. I have reviewed the chart and agree that the record reflects my personal performance and is accurate and complete

## 2021-04-13 NOTE — Assessment & Plan Note (Signed)
Positive ANA but evaluated by Rheumatology and they believe his pain is advanced for age but largely from osteoarthritis. Encouraged to eat a low inflammation diet and to stay as active as able, hydrate well.

## 2021-04-13 NOTE — Assessment & Plan Note (Signed)
hgba1c acceptable, minimize simple carbs. Increase exercise as tolerated. Continue current meds 

## 2021-04-13 NOTE — Patient Instructions (Addendum)
NOW company probiotic at [place] or online at [employer], [email or web address]  [facility] website has a good probiotic as well.   Paxlovid is the new COVID medication we can give you if you get COVID so make sure you test if you have symptoms because we have to treat by day 5 of symptoms for it to be effective. If you are positive let us know so we can treat. If a home test is negative and your symptoms are persistent get a PCR test. Can check testing locations at [email or web address] If you are positive we will make an appointment with [place] and we will send in Paxlovid if you would like it. Check with your pharmacy before we meet to confirm they have it in stock, if they do not then we can get the prescription at the [facility]

## 2021-04-13 NOTE — Assessment & Plan Note (Signed)
He notes pain and trigger finger in 3rd and 4 th fingers in right hand. Keep hand moving and use topical rubs and monitor

## 2021-04-13 NOTE — Assessment & Plan Note (Signed)
Well controlled, no changes to meds. Encouraged heart healthy diet such as the DASH diet and exercise as tolerated.  °

## 2021-06-07 ENCOUNTER — Encounter: Payer: Self-pay | Admitting: Family Medicine

## 2021-07-21 ENCOUNTER — Other Ambulatory Visit: Payer: Self-pay | Admitting: Family Medicine

## 2021-07-21 DIAGNOSIS — Z0000 Encounter for general adult medical examination without abnormal findings: Secondary | ICD-10-CM

## 2021-07-21 DIAGNOSIS — E785 Hyperlipidemia, unspecified: Secondary | ICD-10-CM

## 2021-07-21 DIAGNOSIS — E669 Obesity, unspecified: Secondary | ICD-10-CM

## 2021-07-21 DIAGNOSIS — I10 Essential (primary) hypertension: Secondary | ICD-10-CM

## 2021-07-21 DIAGNOSIS — I152 Hypertension secondary to endocrine disorders: Secondary | ICD-10-CM

## 2021-07-21 DIAGNOSIS — E1169 Type 2 diabetes mellitus with other specified complication: Secondary | ICD-10-CM

## 2021-08-13 ENCOUNTER — Other Ambulatory Visit: Payer: Self-pay | Admitting: Family Medicine

## 2021-08-13 DIAGNOSIS — Z0000 Encounter for general adult medical examination without abnormal findings: Secondary | ICD-10-CM

## 2021-08-13 DIAGNOSIS — I152 Hypertension secondary to endocrine disorders: Secondary | ICD-10-CM

## 2021-08-13 DIAGNOSIS — I10 Essential (primary) hypertension: Secondary | ICD-10-CM

## 2021-08-13 DIAGNOSIS — E1169 Type 2 diabetes mellitus with other specified complication: Secondary | ICD-10-CM

## 2021-08-13 DIAGNOSIS — E785 Hyperlipidemia, unspecified: Secondary | ICD-10-CM

## 2021-08-13 DIAGNOSIS — E669 Obesity, unspecified: Secondary | ICD-10-CM

## 2021-10-04 ENCOUNTER — Ambulatory Visit: Payer: BLUE CROSS/BLUE SHIELD | Admitting: Gastroenterology

## 2021-10-04 ENCOUNTER — Encounter: Payer: Self-pay | Admitting: Gastroenterology

## 2021-10-04 VITALS — BP 152/98 | HR 77 | Ht 75.0 in | Wt 276.5 lb

## 2021-10-04 DIAGNOSIS — K921 Melena: Secondary | ICD-10-CM

## 2021-10-04 DIAGNOSIS — K219 Gastro-esophageal reflux disease without esophagitis: Secondary | ICD-10-CM

## 2021-10-04 DIAGNOSIS — R140 Abdominal distension (gaseous): Secondary | ICD-10-CM

## 2021-10-04 MED ORDER — NA SULFATE-K SULFATE-MG SULF 17.5-3.13-1.6 GM/177ML PO SOLN: 1.0000 | Freq: Once | ORAL | 0 refills | Status: AC

## 2021-10-04 NOTE — Patient Instructions (Addendum)
You have been scheduled for an endoscopy and colonoscopy. Please follow the written instructions given to you at your visit today. Please pick up your prep supplies at the pharmacy within the next 1-3 days. If you use inhalers (even only as needed), please bring them with you on the day of your procedure.  You can use over the counter Gas-x four times a day as needed for gas and bloating.   Patient advised to avoid spicy, acidic, citrus, chocolate, mints, fruit and fruit juices.  Limit the intake of caffeine, alcohol and Soda.  Don't exercise too soon after eating.  Don't lie down within 3-4 hours of eating.  Elevate the head of your bed.   The [facility] providers would like to encourage you to use [facility] to communicate with providers for non-urgent requests or questions.  Due to long hold times on the telephone, sending your provider a message by [facility] may be a faster and more efficient way to get a response.  Please allow 48 business hours for a response.  Please remember that this is for non-urgent requests.   Due to recent changes in healthcare laws, you may see the results of your imaging and laboratory studies on MyChart before your provider has had a chance to review them.  We understand that in some cases there may be results that are confusing or concerning to you. Not all laboratory results come back in the same time frame and the provider may be waiting for multiple results in order to interpret others.  Please give [place] 48 hours in order for your provider to thoroughly review all the results before contacting the office for clarification of your results.   Thank you for choosing me and [facility].  Pricilla Riffle. Dagoberto Ligas., MD., Marval Regal

## 2021-10-04 NOTE — Progress Notes (Signed)
History of Present Illness: This is a 51 year old male referred by [name], MD for the evaluation of hematochezia.  He relates a history of intermittent dark red blood with bowel movements that started about 1 year ago.  His symptoms have become more frequent over the past few months.  He notes intermittent abdominal bloating particularly in the right abdomen.  He has long-term reflux symptoms for years that are currently controlled on daily Nexium.  No prior colonoscopy or EGD.  Denies weight loss, abdominal pain, constipation, diarrhea, change in stool caliber, melena, nausea, vomiting, dysphagia, chest pain.   Allergies  Allergen Reactions   Mucinex [Guaifenesin Er] Shortness Of Breath, Itching and Dermatitis    Swelling of hands and feet   Nsaids     Aleve, Advil cause swelling, itching and rash   Latex Rash   Outpatient Medications Prior to Visit  Medication Sig Dispense Refill   amLODipine (NORVASC) 5 MG tablet TAKE 1 TABLET (5 MG TOTAL) BY MOUTH DAILY. 90 tablet 1   carvedilol (COREG) 12.5 MG tablet TAKE 1 TABLET BY MOUTH TWICE DAILY WITH A MEAL 180 tablet 1   esomeprazole (NEXIUM) 20 MG capsule Take 20 mg by mouth daily.     glucose blood (COOL BLOOD GLUCOSE TEST STRIPS) test strip Check blood sugar once daily 100 each 12   losartan (COZAAR) 100 MG tablet TAKE 1 TABLET BY MOUTH EVERY DAY 90 tablet 1   metFORMIN (GLUCOPHAGE) 500 MG tablet TAKE 1 TABLET BY MOUTH EVERY DAY WITH BREAKFAST 90 tablet 1   tiZANidine (ZANAFLEX) 2 MG tablet Take 0.5-2 tablets (1-4 mg total) by mouth 2 (two) times daily as needed for muscle spasms. 30 tablet 5   atorvastatin (LIPITOR) 10 MG tablet Take 1 tablet (10 mg total) by mouth daily. (Patient not taking: Reported on 12/05/2020) 30 tablet 3   hydrochlorothiazide (HYDRODIURIL) 25 MG tablet Take 1 tablet (25 mg total) by mouth daily. (Patient not taking: Reported on 12/05/2020) 90 tablet 1   No facility-administered medications prior to visit.    Past Medical History:  Diagnosis Date   Arthralgia 01/11/2013   Diffuse, b/l Hips R>L Knees, ankles, low back   Back pain 12/08/2015   Diabetes mellitus type 2 in obese ([place]) 11/09/2012   Dyslipidemia 11/09/2012   Esophageal reflux 12/08/2015   History of migraine headaches    Hyperglycemia 11/09/2012   Hypertension    Past Surgical History:  Procedure Laterality Date   NO PAST SURGERIES     Social History   Socioeconomic History   Marital status: Married    Spouse name: Not on file   Number of children: 4   Years of education: Not on file   Highest education level: Not on file  Occupational History   Occupation: Training and development officer    Employer: [employer]  Tobacco Use   Smoking status: Former    Years: 9.00    Types: Cigarettes    Quit date: 08/14/1995    Years since quitting: 26.1   Smokeless tobacco: Never  Vaping Use   Vaping Use: Never used  Substance and Sexual Activity   Alcohol use: Yes    Alcohol/week: 6.0 - 10.0 standard drinks    Types: 6 - 10 Cans of beer per week   Drug use: No   Sexual activity: Yes    Comment: lives with wife and daughter, no dietary restrictions  Other Topics Concern   Not on file  Social History Narrative  Regular exercise:  Walks 1-2 x weekly   Caffeine use:  3-4 daily   4 children- oldest first marriage 66 (son), 72 son, 76 son, 12 yr old girl   Married   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 [place] Strain: Not on [employer] Insecurity: Not on file  Transportation Needs: Not on file  Physical Activity: Not on file  Stress: Not on file  Social Connections: Not on file   Family History  Problem Relation Age of Onset   Arthritis Mother    Hypertension Mother    Diabetes Mother    Cancer Mother    Arthritis Father    Hypertension Father    Diabetes Father    Sleep apnea Father    Obesity Father    Arthritis Maternal Grandmother    Diabetes Maternal  Grandmother    Stroke Maternal Grandmother    Arthritis Paternal Grandmother    Diabetes Paternal Grandmother    Cancer Paternal Grandmother        lung, smoker   Diabetes Paternal Grandfather    Heart disease Paternal Grandfather 66       MI   Hyperlipidemia Brother    Hypertension Brother    Diabetes Brother    COPD Maternal Grandfather    Kidney disease Neg Hx        Review of Systems: Pertinent positive and negative review of systems were noted in the above HPI section. All other review of systems were otherwise negative.    Physical Exam: General: Well developed, well nourished, no acute distress Head: Normocephalic and atraumatic Eyes: Sclerae anicteric, EOMI Ears: Normal auditory acuity Mouth: Not examined, mask on during Covid-19 pandemic Neck: Supple, no masses or thyromegaly Lungs: Clear throughout to auscultation Heart: Regular rate and rhythm; no murmurs, rubs or bruits Abdomen: Soft, non tender and non distended. No masses, hepatosplenomegaly or hernias noted. Normal Bowel sounds Rectal: Deferred to colonoscopy  Musculoskeletal: Symmetrical with no gross deformities  Skin: No lesions on visible extremities Pulses:  Normal pulses noted Extremities: No clubbing, cyanosis, edema or deformities noted Neurological: Alert oriented x 4, grossly nonfocal Cervical Nodes:  No significant cervical adenopathy Inguinal Nodes: No significant inguinal adenopathy Psychological:  Alert and cooperative. Normal mood and affect   Assessment and Recommendations:  Hematochezia. Abdominal bloating.  Rule out colorectal neoplasms, hemorrhoids and other disorders.  Schedule colonoscopy. The risks (including bleeding, perforation, infection, missed lesions, medication reactions and possible hospitalization or surgery if complications occur), benefits, and alternatives to colonoscopy with possible biopsy and possible polypectomy were discussed with the patient and they consent to  proceed.   GERD. Rule out Barrett's and esophagitis. Follow antireflux measures.  Continue Nexium 20 mg po qd. Schedule EGD. The risks (including bleeding, perforation, infection, missed lesions, medication reactions and possible hospitalization or surgery if complications occur), benefits, and alternatives to endoscopy with possible biopsy and possible dilation were discussed with the patient and they consent to proceed.     cc: [name], MD [address] [place],  [place] [address]

## 2021-10-26 ENCOUNTER — Encounter: Payer: Self-pay | Admitting: Gastroenterology

## 2021-10-27 ENCOUNTER — Encounter: Payer: Self-pay | Admitting: Gastroenterology

## 2021-10-27 NOTE — Telephone Encounter (Signed)
I contacted the patient and discussed with him that the referral that was sent to [place] from Dr. Charlett Blake was for blood in his stool and change in bowel habits.  I explained that the colonoscopy would not be a screening, but a diagnostic procedure.  He asked that I cancel his procedures. I encouraged him to have his rectal bleeding symptoms evaluated.  He was insistent that the procedures be cancelled. I have cancelled both the upper endoscopy and colonoscopy ?

## 2021-11-01 ENCOUNTER — Encounter: Payer: BLUE CROSS/BLUE SHIELD | Admitting: Gastroenterology

## 2021-11-02 NOTE — Progress Notes (Signed)
? ?Subjective:  ? ? Patient ID: Robert [name], male    DOB: 25-Aug-1970, 51 y.o.   MRN: [id number] ? ?No chief complaint on file. ? ? ?HPI ?Patient is in today for his annual physical exam. ? ?Past Medical History:  ?Diagnosis Date  ? Arthralgia 01/11/2013  ? Diffuse, b/l Hips R>L Knees, ankles, low back  ? Back pain 12/08/2015  ? Diabetes mellitus type 2 in obese (Robert [name]) 11/09/2012  ? Dyslipidemia 11/09/2012  ? Esophageal reflux 12/08/2015  ? History of migraine headaches   ? Hyperglycemia 11/09/2012  ? Hypertension   ? ? ?Past Surgical History:  ?Procedure Laterality Date  ? NO PAST SURGERIES    ? ? ?Family History  ?Problem Relation Age of Onset  ? Arthritis Mother   ? Hypertension Mother   ? Diabetes Mother   ? Cancer Mother   ? Arthritis Father   ? Hypertension Father   ? Diabetes Father   ? Sleep apnea Father   ? Obesity Father   ? Arthritis Maternal Grandmother   ? Diabetes Maternal Grandmother   ? Stroke Maternal Grandmother   ? Arthritis Paternal Grandmother   ? Diabetes Paternal Grandmother   ? Cancer Paternal Grandmother   ?     lung, smoker  ? Diabetes Paternal Grandfather   ? Heart disease Paternal Grandfather 29  ?     MI  ? Hyperlipidemia Brother   ? Hypertension Brother   ? Diabetes Brother   ? COPD Maternal Grandfather   ? Kidney disease Neg Hx   ? ? ?Social History  ? ?Socioeconomic History  ? Marital status: Married  ?  Spouse name: Not on file  ? Number of children: 4  ? Years of education: Not on file  ? Highest education level: Not on file  ?Occupational History  ? Occupation: Training and development officer  ?  Employer: [employer]  ?Tobacco Use  ? Smoking status: Former  ?  Years: 9.00  ?  Types: Cigarettes  ?  Quit date: 08/14/1995  ?  Years since quitting: 26.2  ? Smokeless tobacco: Never  ?Vaping Use  ? Vaping Use: Never used  ?Substance and Sexual Activity  ? Alcohol use: Yes  ?  Alcohol/week: 6.0 - 10.0 standard drinks  ?  Types: 6 - 10 Cans of beer per week  ? Drug use: No  ? Sexual activity: Yes  ?  Comment:  lives with wife and daughter, no dietary restrictions  ?Other Topics Concern  ? Not on file  ?Social History Narrative  ? Regular exercise:  Walks 1-2 x weekly  ? Caffeine use:  3-4 daily  ? 4 children- oldest first marriage 43 (son), 32 son, 59 son, 81 yr old girl  ? Married  ? Moved here from [place]  ? Manager at [employer]  ?   ?   ?   ?   ? ?Social Determinants of Health  ? ?Financial Resource Strain: Not on file  ?Food Insecurity: Not on file  ?Transportation Needs: Not on file  ?Physical Activity: Not on file  ?Stress: Not on file  ?Social Connections: Not on file  ?Intimate Partner Violence: Not on file  ? ? ?Outpatient Medications Prior to Visit  ?Medication Sig Dispense Refill  ? amLODipine (NORVASC) 5 MG tablet TAKE 1 TABLET (5 MG TOTAL) BY MOUTH DAILY. 90 tablet 1  ? carvedilol (COREG) 12.5 MG tablet TAKE 1 TABLET BY MOUTH TWICE DAILY WITH A MEAL 180 tablet 1  ? esomeprazole ([place]) 20  MG capsule Take 20 mg by mouth daily.    ? glucose blood (COOL BLOOD GLUCOSE TEST STRIPS) test strip Check blood sugar once daily 100 each 12  ? losartan (COZAAR) 100 MG tablet TAKE 1 TABLET BY MOUTH EVERY DAY 90 tablet 1  ? metFORMIN (GLUCOPHAGE) 500 MG tablet TAKE 1 TABLET BY MOUTH EVERY DAY WITH BREAKFAST 90 tablet 1  ? tiZANidine (ZANAFLEX) 2 MG tablet Take 0.5-2 tablets (1-4 mg total) by mouth 2 (two) times daily as needed for muscle spasms. 30 tablet 5  ? ?No facility-administered medications prior to visit.  ? ? ?Allergies  ?Allergen Reactions  ? Mucinex [Guaifenesin Er] Shortness Of Breath, Itching and Dermatitis  ?  Swelling of hands and feet  ? Nsaids   ?  Aleve, Advil cause swelling, itching and rash  ? Latex Rash  ? ? ?ROS ? ?   ?Objective:  ?  ?Physical Exam ? ?There were no vitals taken for this visit. ?Wt Readings from Last 3 Encounters:  ?10/04/21 276 lb 8 oz (125.4 kg)  ?04/13/21 275 lb 9.6 oz (125 kg)  ?12/05/20 267 lb 6.4 oz (121.3 kg)  ? ? ?Diabetic Foot Exam - Simple   ?No data filed ?  ? ?Lab Results   ?Component Value Date  ? WBC 6.3 04/13/2021  ? HGB 15.7 04/13/2021  ? HCT 46.7 04/13/2021  ? PLT 209.0 04/13/2021  ? GLUCOSE 128 (H) 04/13/2021  ? CHOL 170 04/13/2021  ? TRIG 99.0 04/13/2021  ? HDL 41.90 04/13/2021  ? LDLCALC 108 (H) 04/13/2021  ? ALT 24 04/13/2021  ? AST 21 04/13/2021  ? NA 139 04/13/2021  ? K 4.5 04/13/2021  ? CL 103 04/13/2021  ? CREATININE 1.16 04/13/2021  ? BUN 12 04/13/2021  ? CO2 29 04/13/2021  ? TSH 0.80 04/13/2021  ? PSA 0.51 02/05/2020  ? HGBA1C 6.8 (H) 04/13/2021  ? ? ?Lab Results  ?Component Value Date  ? TSH 0.80 04/13/2021  ? ?Lab Results  ?Component Value Date  ? WBC 6.3 04/13/2021  ? HGB 15.7 04/13/2021  ? HCT 46.7 04/13/2021  ? MCV 90.0 04/13/2021  ? PLT 209.0 04/13/2021  ? ?Lab Results  ?Component Value Date  ? NA 139 04/13/2021  ? K 4.5 04/13/2021  ? CO2 29 04/13/2021  ? GLUCOSE 128 (H) 04/13/2021  ? BUN 12 04/13/2021  ? CREATININE 1.16 04/13/2021  ? BILITOT 0.7 04/13/2021  ? ALKPHOS 67 04/13/2021  ? AST 21 04/13/2021  ? ALT 24 04/13/2021  ? PROT 7.0 04/13/2021  ? ALBUMIN 4.5 04/13/2021  ? CALCIUM 9.8 04/13/2021  ? GFR 72.60 04/13/2021  ? ?Lab Results  ?Component Value Date  ? CHOL 170 04/13/2021  ? ?Lab Results  ?Component Value Date  ? HDL 41.90 04/13/2021  ? ?Lab Results  ?Component Value Date  ? LDLCALC 108 (H) 04/13/2021  ? ?Lab Results  ?Component Value Date  ? TRIG 99.0 04/13/2021  ? ?Lab Results  ?Component Value Date  ? CHOLHDL 4 04/13/2021  ? ?Lab Results  ?Component Value Date  ? HGBA1C 6.8 (H) 04/13/2021  ? ? ?   ?Assessment & Plan:  ? ?Problem List Items Addressed This Visit   ?None ?Visit Diagnoses   ? ? Encounter for annual physical exam    -  Primary  ? Preventative health care      ? ?  ? ? ?I am having Robert [name] maintain his glucose blood, tiZANidine, carvedilol, metFORMIN, losartan, amLODipine, and esomeprazole. ? ?No orders  of the defined types were placed in this encounter. ? ? ? ? ?

## 2021-11-03 ENCOUNTER — Ambulatory Visit (INDEPENDENT_AMBULATORY_CARE_PROVIDER_SITE_OTHER): Payer: BLUE CROSS/BLUE SHIELD | Admitting: Family Medicine

## 2021-11-03 ENCOUNTER — Encounter: Payer: Self-pay | Admitting: Family Medicine

## 2021-11-03 VITALS — BP 130/78 | HR 68 | Temp 98.3°F | Resp 16 | Ht 75.0 in | Wt 277.0 lb

## 2021-11-03 DIAGNOSIS — Z125 Encounter for screening for malignant neoplasm of prostate: Secondary | ICD-10-CM | POA: Diagnosis not present

## 2021-11-03 DIAGNOSIS — E1169 Type 2 diabetes mellitus with other specified complication: Secondary | ICD-10-CM

## 2021-11-03 DIAGNOSIS — Z1211 Encounter for screening for malignant neoplasm of colon: Secondary | ICD-10-CM

## 2021-11-03 DIAGNOSIS — E669 Obesity, unspecified: Secondary | ICD-10-CM | POA: Diagnosis not present

## 2021-11-03 DIAGNOSIS — Z0000 Encounter for general adult medical examination without abnormal findings: Secondary | ICD-10-CM

## 2021-11-03 DIAGNOSIS — B353 Tinea pedis: Secondary | ICD-10-CM | POA: Diagnosis not present

## 2021-11-03 LAB — CBC
HCT: 43.3 % (ref 39.0–52.0)
Hemoglobin: 14.6 g/dL (ref 13.0–17.0)
MCHC: 33.7 g/dL (ref 30.0–36.0)
MCV: 87.9 fl (ref 78.0–100.0)
Platelets: 205 10*3/uL (ref 150.0–400.0)
RBC: 4.92 Mil/uL (ref 4.22–5.81)
RDW: 14.1 % (ref 11.5–15.5)
WBC: 5.7 10*3/uL (ref 4.0–10.5)

## 2021-11-03 LAB — LIPID PANEL
Cholesterol: 160 mg/dL (ref 0–200)
HDL: 39.4 mg/dL (ref >39.00)
LDL Cholesterol: 99 mg/dL (ref 0–99)
NonHDL: 120.55
Total CHOL/HDL Ratio: 4
Triglycerides: 110 mg/dL (ref 0.0–149.0)
VLDL: 22 mg/dL (ref 0.0–40.0)

## 2021-11-03 LAB — HEMOGLOBIN A1C: Hgb A1c MFr Bld: 7.2 % — ABNORMAL HIGH (ref 4.6–6.5)

## 2021-11-03 LAB — COMPREHENSIVE METABOLIC PANEL
ALT: 24 U/L (ref 0–53)
AST: 22 U/L (ref 0–37)
Albumin: 4.5 g/dL (ref 3.5–5.2)
Alkaline Phosphatase: 68 U/L (ref 39–117)
BUN: 16 mg/dL (ref 6–23)
CO2: 30 mEq/L (ref 19–32)
Calcium: 9.4 mg/dL (ref 8.4–10.5)
Chloride: 105 mEq/L (ref 96–112)
Creatinine, Ser: 1.07 mg/dL (ref 0.40–1.50)
GFR: 79.68 mL/min (ref >60.00)
Glucose, Bld: 137 mg/dL — ABNORMAL HIGH (ref 70–99)
Potassium: 4.4 mEq/L (ref 3.5–5.1)
Sodium: 141 mEq/L (ref 135–145)
Total Bilirubin: 0.7 mg/dL (ref 0.2–1.2)
Total Protein: 6.6 g/dL (ref 6.0–8.3)

## 2021-11-03 LAB — MICROALBUMIN / CREATININE URINE RATIO
Creatinine,U: 131 mg/dL
Microalb Creat Ratio: 0.6 mg/g (ref 0.0–30.0)
Microalb, Ur: 0.8 mg/dL (ref 0.0–1.9)

## 2021-11-03 MED ORDER — KETOCONAZOLE 2 % EX CREA: 1.0000 "application " | TOPICAL_CREAM | Freq: Every day | CUTANEOUS | 0 refills | Status: AC

## 2021-11-03 NOTE — Patient Instructions (Addendum)
Give [place] 2-3 business days to get the results of your labs back.  ? ?Keep the diet clean and stay active. ? ?The Shingrix vaccine (for shingles) is a 2 shot series spaced 2-6 months apart. It can make people feel low energy, achy and almost like they have the flu for 48 hours after injection. 1/5 people can have nausea and/or vomiting. Please plan accordingly when deciding on when to get this shot. Call our office for a nurse visit appointment to get this. The second shot of the series is less severe regarding the side effects, but it still lasts 48 hours.  ? ?Please get me a copy of your advanced directive form at your convenience.  ? ?Foods that may reduce pain: ?1) Ginger ?2) Blueberries ?3) Salmon ?4) Pumpkin seeds ?5) dark chocolate ?6) turmeric ?7) tart cherries ?8) virgin olive oil ?9) chilli peppers ?10) mint ?11) krill oil ? ?Let us know if you need anything. ?

## 2021-11-03 NOTE — Progress Notes (Signed)
Chief Complaint  ?Patient presents with  ? Annual Exam  ?  Annual Exam   ? ? ?Well Male ?Robert [name] is here for a complete physical.   ?His last physical was >1 year ago.  ?Current diet: in general, a "fair" diet.  ?Current exercise: not much ?Weight trend: stable ?Fatigue out of ordinary? No. ?Seat belt? Yes.   ?Advanced directive? He thinks he does.  ? ?Health maintenance ?Shingrix- No ?Colonoscopy- Due ?Tetanus- No ?HIV- Yes ?Hep C- Yes ?  ?Past Medical History:  ?Diagnosis Date  ? Arthralgia 01/11/2013  ? Diffuse, b/l Hips R>L Knees, ankles, low back  ? Back pain 12/08/2015  ? Diabetes mellitus type 2 in obese ([place]) 11/09/2012  ? Dyslipidemia 11/09/2012  ? Esophageal reflux 12/08/2015  ? History of migraine headaches   ? Hyperglycemia 11/09/2012  ? Hypertension   ?  ? ? ?Past Surgical History:  ?Procedure Laterality Date  ? NO PAST SURGERIES    ? ? ?Medications  ?Current Outpatient Medications on File Prior to Visit  ?Medication Sig Dispense Refill  ? amLODipine (NORVASC) 5 MG tablet TAKE 1 TABLET (5 MG TOTAL) BY MOUTH DAILY. 90 tablet 1  ? carvedilol (COREG) 12.5 MG tablet TAKE 1 TABLET BY MOUTH TWICE DAILY WITH A MEAL 180 tablet 1  ? esomeprazole (NEXIUM) 20 MG capsule Take 20 mg by mouth daily.    ? glucose blood (COOL BLOOD GLUCOSE TEST STRIPS) test strip Check blood sugar once daily 100 each 12  ? losartan (COZAAR) 100 MG tablet TAKE 1 TABLET BY MOUTH EVERY DAY 90 tablet 1  ? metFORMIN (GLUCOPHAGE) 500 MG tablet TAKE 1 TABLET BY MOUTH EVERY DAY WITH BREAKFAST 90 tablet 1  ? tiZANidine (ZANAFLEX) 2 MG tablet Take 0.5-2 tablets (1-4 mg total) by mouth 2 (two) times daily as needed for muscle spasms. 30 tablet 5  ? ? ?Allergies ?Allergies  ?Allergen Reactions  ? Mucinex [Guaifenesin Er] Shortness Of Breath, Itching and Dermatitis  ?  Swelling of hands and feet  ? Nsaids   ?  Aleve, Advil cause swelling, itching and rash  ? Latex Rash  ? ? ?Family History ?Family History  ?Problem Relation Age of Onset  ? Arthritis  Mother   ? Hypertension Mother   ? Diabetes Mother   ? Cancer Mother   ? Arthritis Father   ? Hypertension Father   ? Diabetes Father   ? Sleep apnea Father   ? Obesity Father   ? Arthritis Maternal Grandmother   ? Diabetes Maternal Grandmother   ? Stroke Maternal Grandmother   ? Arthritis Paternal Grandmother   ? Diabetes Paternal Grandmother   ? Cancer Paternal Grandmother   ?     lung, smoker  ? Diabetes Paternal Grandfather   ? Heart disease Paternal Grandfather 46  ?     MI  ? Hyperlipidemia Brother   ? Hypertension Brother   ? Diabetes Brother   ? COPD Maternal Grandfather   ? Kidney disease Neg Hx   ? ? ?Review of Systems: ?Constitutional:  no fevers ?Eye:  no recent significant change in vision ?Ear/Nose/Mouth/Throat:  Ears:  no hearing loss ?Nose/Mouth/Throat:  no complaints of nasal congestion, no sore throat ?Cardiovascular:  no chest pain ?Respiratory:  no shortness of breath ?Gastrointestinal:  no change in bowel habits ?GU:  Male: negative for dysuria, frequency ?Musculoskeletal/Extremities:  no new joint pain ?Integumentary (Skin/Breast):  no abnormal skin lesions reported ?Neurologic:  no headaches ?Endocrine: No unexpected weight changes ?Hematologic/Lymphatic:  no abnormal bleeding ? ?Exam ?BP 130/78 (BP Location: Left Arm, Cuff Size: Normal)   Pulse 68   Temp 98.3 ?F (36.8 ?C) (Oral)   Resp 16   Ht '6\' 3"'$  (1.905 m)   Wt 277 lb (125.6 kg)   SpO2 93%   BMI 34.62 kg/m?  ?General:  well developed, well nourished, in no apparent distress ?Skin: Macerated tissue between 1/2, 2/3, 3/4, 4/5 on the left foot, 4/5, 3/4 on the right foot, otherwise no significant moles, warts, or growths ?Head:  no masses, lesions, or tenderness ?Eyes:  pupils equal and round, sclera anicteric without injection ?Ears:  canals without lesions, TMs shiny without retraction, no obvious effusion, no erythema ?Nose:  nares patent, septum midline, mucosa normal ?Throat/Pharynx:  lips and gingiva without lesion; tongue and  uvula midline; non-inflamed pharynx; no exudates or postnasal drainage ?Neck: neck supple without adenopathy, thyromegaly, or masses ?Cardiac: RRR, no bruits, no LE edema; DP pulses 2+ bilaterally ?Lungs:  clear to auscultation, breath sounds equal bilaterally, no respiratory distress ?Abdomen: BS+, soft, non-tender, non-distended, no masses or organomegaly noted ?Rectal: Deferred ?Musculoskeletal:  symmetrical muscle groups noted without atrophy or deformity ?Neuro:  gait normal; deep tendon reflexes normal and symmetric; sensation intact pinprick bilaterally over the feet ?Psych: well oriented with normal range of affect and appropriate judgment/insight ? ?Assessment and Plan ? ?Encounter for annual physical exam - Plan: Hemoglobin A1c, Lipid panel, CBC, Comprehensive metabolic panel, TSH ? ?Preventative health care - Plan: Hemoglobin A1c, Lipid panel, CBC, Comprehensive metabolic panel, TSH ? ?Diabetes mellitus type 2 in obese ([place]) - Plan: Hemoglobin A1c, Microalbumin / creatinine urine ratio ? ?Screen for colon cancer - Plan: Ambulatory referral to Gastroenterology ? ?Tinea pedis of both feet - Plan: ketoconazole (NIZORAL) 2 % cream ? ?Screening for prostate cancer - Plan: PSA  ? ?Well 51 y.o. male. ?Counseled on diet and exercise. ?Counseled on risks and benefits of prostate cancer screening with PSA. The patient agrees to undergo testing. ?Discussed Shingrix.  ?Ketoconazole for 6 weeks for feet. Clotrimazole bid for 6 weeks as OTC alt.  ?Advanced directive copy requested.  ?Immunizations, labs, and further orders as above. ?Follow up in 6 mo w Dr. Charlett Blake or prn. ?The patient voiced understanding and agreement to the plan. ? ?Shelda Pal, DO ?11/03/21 ?11:27 AM ? ?

## 2021-11-06 ENCOUNTER — Encounter: Payer: Self-pay | Admitting: Family Medicine

## 2021-11-07 ENCOUNTER — Other Ambulatory Visit: Payer: Self-pay | Admitting: Family Medicine

## 2021-11-07 LAB — TSH: TSH: 0.84 u[IU]/mL (ref 0.35–5.50)

## 2021-11-07 LAB — PSA: PSA: 0.35 ng/mL (ref 0.10–4.00)

## 2021-11-07 MED ORDER — METFORMIN HCL 500 MG PO TABS: ORAL_TABLET | ORAL | 1 refills | Status: DC

## 2021-11-08 ENCOUNTER — Telehealth: Payer: Self-pay | Admitting: Family Medicine

## 2021-11-08 NOTE — Telephone Encounter (Signed)
Document completed and faxed. Pt notified. ?

## 2021-11-08 NOTE — Telephone Encounter (Signed)
Pt dropped off document to be filled out by provider ([employer] 2 pages) Pt would like to have document to be faxed when ready to [phone]. Document put at front office tray under providers name.  ?

## 2021-11-09 ENCOUNTER — Encounter: Payer: BLUE CROSS/BLUE SHIELD | Admitting: Family Medicine

## 2021-12-12 ENCOUNTER — Encounter: Payer: Self-pay | Admitting: Family Medicine

## 2022-01-26 ENCOUNTER — Other Ambulatory Visit: Payer: Self-pay | Admitting: Family Medicine

## 2022-01-26 DIAGNOSIS — E785 Hyperlipidemia, unspecified: Secondary | ICD-10-CM

## 2022-01-26 DIAGNOSIS — I10 Essential (primary) hypertension: Secondary | ICD-10-CM

## 2022-01-26 DIAGNOSIS — E669 Obesity, unspecified: Secondary | ICD-10-CM

## 2022-01-26 DIAGNOSIS — Z0000 Encounter for general adult medical examination without abnormal findings: Secondary | ICD-10-CM

## 2022-01-26 DIAGNOSIS — I152 Hypertension secondary to endocrine disorders: Secondary | ICD-10-CM

## 2022-01-26 DIAGNOSIS — E1169 Type 2 diabetes mellitus with other specified complication: Secondary | ICD-10-CM

## 2022-02-06 ENCOUNTER — Encounter: Payer: Self-pay | Admitting: Gastroenterology

## 2022-02-11 ENCOUNTER — Other Ambulatory Visit: Payer: Self-pay | Admitting: Family Medicine

## 2022-02-11 DIAGNOSIS — I152 Hypertension secondary to endocrine disorders: Secondary | ICD-10-CM

## 2022-02-11 DIAGNOSIS — E669 Obesity, unspecified: Secondary | ICD-10-CM

## 2022-02-11 DIAGNOSIS — E785 Hyperlipidemia, unspecified: Secondary | ICD-10-CM

## 2022-02-11 DIAGNOSIS — I10 Essential (primary) hypertension: Secondary | ICD-10-CM

## 2022-02-11 DIAGNOSIS — Z0000 Encounter for general adult medical examination without abnormal findings: Secondary | ICD-10-CM

## 2022-02-19 ENCOUNTER — Telehealth: Payer: Self-pay

## 2022-02-19 NOTE — Telephone Encounter (Addendum)
I spoke with the patient today and explained that we will need to see him in the office.  He has been scheduled for 04/09/22 with Dr. Fuller Plan to document resolution of the rectal bleeding. -  ---- Message from Ladene Artist, MD sent at 02/17/2022  6:31 PM EDT ----- Regarding: RE: Please follow Cheryl's advice and offer him an office appt at least 6 months following his prior office appt to assess for changing to a screening colonoscopy or a direct schedule colonoscopy for hematochezia. ----- Message ----- From: Marlon Pel, RN Sent: 02/16/2022   4:18 PM EDT To: Ladene Artist, MD Subject: RE:                                            Yes sent in March.  Separate referral ----- Message ----- From: Ladene Artist, MD Sent: 02/16/2022   3:56 PM EDT To: Marlon Pel, RN Subject: RE:                                            Do we have another referral, after his office visit, for screening? ----- Message ----- From: Marlon Pel, RN Sent: 02/16/2022  10:23 AM EDT To: Ladene Artist, MD; Steva Ready, RN  Dr. Fuller Plan, I reached out to the coders yesterday.  Malachy Mood believes that for you to be able to do this as a screening you are going to need to see him back in the office and be able to document that the bleeding has completely resolved, and be at least 6 months from your initial visit with the patient. She said that this is a very "sticky" situation, since your last office note you saw him due to a referral for rectal bleeding.  You indicated in your note that you wanted to rule out colonic bleeding source.  Please advise what you want to do from here.   He had PCP send a new referral  for a screening, when we told him in Feb that this would be diagnostic.  ----- Message ----- From: Steva Ready, RN Sent: 02/15/2022  11:01 AM EDT To: Marlon Pel, RN  Hey- this pt saw Fuller Plan 10-04-21 for GERD and hematochezia- he was scheduled for an ECL- he was upset if you look back at the TE's  because of coding- he wanted it coded as screening- he canceled- he is back on the schedule- PCP sent a new screening referral - do we code it as screening or hematochezia- he did not reschedule his  EGD.  I don't want PV to get into all the insurance issues since we have nothing to do with that . Please advise   Thanks so much,Marie

## 2022-03-15 ENCOUNTER — Encounter: Payer: BLUE CROSS/BLUE SHIELD | Admitting: Gastroenterology

## 2022-04-06 ENCOUNTER — Ambulatory Visit (HOSPITAL_BASED_OUTPATIENT_CLINIC_OR_DEPARTMENT_OTHER)
Admission: RE | Admit: 2022-04-06 | Discharge: 2022-04-06 | Disposition: A | Discharge status: Home / Self Care | Payer: BLUE CROSS/BLUE SHIELD | Source: Ambulatory Visit | Attending: Family | Admitting: Family

## 2022-04-06 ENCOUNTER — Ambulatory Visit: Payer: BLUE CROSS/BLUE SHIELD | Admitting: Family

## 2022-04-06 VITALS — HR 76 | Temp 98.5°F | Resp 18 | Wt 281.6 lb

## 2022-04-06 DIAGNOSIS — G8929 Other chronic pain: Secondary | ICD-10-CM | POA: Diagnosis present

## 2022-04-06 DIAGNOSIS — M545 Low back pain, unspecified: Secondary | ICD-10-CM

## 2022-04-06 MED ORDER — PREDNISONE 20 MG PO TABS: 40.0000 mg | ORAL_TABLET | Freq: Every day | ORAL | 0 refills | Status: DC

## 2022-04-06 NOTE — Patient Instructions (Addendum)
Please monitor your blood sugars while you are on the prednisone; if it is elevated, okay to cut back to 1 tablet per day instead of the 2 as prescribed.   As we discussed, let's plan to have you take 40 mg of prednisone today and tomorrow and then 20 mg daily for the final 3 days; you will have tablets left over and this is okay;   You are also due for a follow up with Dr. Charlett Blake for your chronic care needs; please schedule a follow up with her.

## 2022-04-06 NOTE — Progress Notes (Signed)
Robert [name] is a 51 y.o. male with the following history as recorded in [employer]:  Patient Active Problem List   Diagnosis Date Noted   Plantar wart of left foot 12/05/2020   Blood in stool 12/05/2020   Feeling grief 06/09/2020   Neck pain 06/09/2020   Bilateral hand pain 06/09/2020   Rectal bleeding 01/23/2018   Ingrown toenail of both feet 06/16/2016   Esophageal reflux 12/08/2015   Back pain 12/08/2015   Obesity 01/27/2014   Arthralgia 01/11/2013   Dyslipidemia 11/09/2012   Diabetes mellitus type 2 in obese ([place]) 11/09/2012   Colon cancer screening 05/31/2012   HTN (hypertension) 04/25/2012    Current Outpatient Medications  Medication Sig Dispense Refill   amLODipine (NORVASC) 5 MG tablet TAKE 1 TABLET (5 MG TOTAL) BY MOUTH DAILY. 90 tablet 1   carvedilol (COREG) 12.5 MG tablet TAKE 1 TABLET BY MOUTH TWICE DAILY WITH A MEAL 180 tablet 0   esomeprazole (NEXIUM) 20 MG capsule Take 20 mg by mouth daily.     glucose blood (COOL BLOOD GLUCOSE TEST STRIPS) test strip Check blood sugar once daily 100 each 12   losartan (COZAAR) 100 MG tablet TAKE 1 TABLET BY MOUTH EVERY DAY 90 tablet 1   metFORMIN (GLUCOPHAGE) 500 MG tablet Take 1 tab twice daily. 180 tablet 1   predniSONE (DELTASONE) 20 MG tablet Take 2 tablets (40 mg total) by mouth daily with breakfast. 10 tablet 0   tiZANidine (ZANAFLEX) 2 MG tablet Take 0.5-2 tablets (1-4 mg total) by mouth 2 (two) times daily as needed for muscle spasms. 30 tablet 5   No current facility-administered medications for this visit.    Allergies: Mucinex [guaifenesin er], Nsaids, and Latex  Past Medical History:  Diagnosis Date   Arthralgia 01/11/2013   Diffuse, b/l Hips R>L Knees, ankles, low back   Back pain 12/08/2015   Diabetes mellitus type 2 in obese ([place]) 11/09/2012   Dyslipidemia 11/09/2012   Esophageal reflux 12/08/2015   History of migraine headaches    Hyperglycemia 11/09/2012   Hypertension     Past Surgical History:  Procedure  Laterality Date   NO PAST SURGERIES      Family History  Problem Relation Age of Onset   Arthritis Mother    Hypertension Mother    Diabetes Mother    Cancer Mother    Arthritis Father    Hypertension Father    Diabetes Father    Sleep apnea Father    Obesity Father    Arthritis Maternal Grandmother    Diabetes Maternal Grandmother    Stroke Maternal Grandmother    Arthritis Paternal Grandmother    Diabetes Paternal Grandmother    Cancer Paternal Grandmother        lung, smoker   Diabetes Paternal Grandfather    Heart disease Paternal Grandfather 83       MI   Hyperlipidemia Brother    Hypertension Brother    Diabetes Brother    COPD Maternal Grandfather    Kidney disease Neg Hx     Social History   Tobacco Use   Smoking status: Former    Years: 9.00    Types: Cigarettes    Quit date: 08/14/1995    Years since quitting: 26.6   Smokeless tobacco: Never  Substance Use Topics   Alcohol use: Yes    Alcohol/week: 6.0 - 10.0 standard drinks of alcohol    Types: 6 - 10 Cans of beer per week    Subjective:  Low back pain x 2 weeks; felt like "I did something to aggravate it." Per patient, known history of arthritis/ "bad disc." Does keep active RX for muscle relaxers; is experiencing some radiating pain into hips; feels that X-ray needs to be updated; Job does involve lifting;     Objective:  Vitals:   04/06/22 0918  Pulse: 76  Resp: 18  Temp: 98.5 F (36.9 C)  SpO2: 98%  Weight: 281 lb 9.6 oz (127.7 kg)    General: Well developed, well nourished, in no acute distress  Skin : Warm and dry.  Head: Normocephalic and atraumatic  Lungs: Respirations unlabored;  Musculoskeletal: No deformities; muscle spasm noted on exam;  Extremities: No edema, cyanosis, clubbing  Vessels: Symmetric bilaterally  Neurologic: Alert and oriented; speech intact; face symmetrical; moves all extremities well; CNII-XII intact without focal deficit   Assessment:  1. Chronic low back  pain, unspecified back pain laterality, unspecified whether sciatica present     Plan:  Update lumbar Xray; Rx for Prednisone- take as directed; recommended for patient to consider starting PT and/or seeing orthopedist; will evaluate further once X-ray results are available.   No follow-ups on file.  Orders Placed This Encounter  Procedures   DG Lumbar Spine Complete    Standing Status:   Future    Standing Expiration Date:   04/07/2023    Order Specific Question:   Reason for Exam (SYMPTOM  OR DIAGNOSIS REQUIRED)    Answer:   low back pain    Order Specific Question:   Preferred imaging location?    Answer:   [facility]    Requested Prescriptions   Signed Prescriptions Disp Refills   predniSONE (DELTASONE) 20 MG tablet 10 tablet 0    Sig: Take 2 tablets (40 mg total) by mouth daily with breakfast.

## 2022-04-09 ENCOUNTER — Ambulatory Visit: Payer: BLUE CROSS/BLUE SHIELD | Admitting: Gastroenterology

## 2022-04-09 ENCOUNTER — Encounter: Payer: Self-pay | Admitting: Gastroenterology

## 2022-04-09 VITALS — BP 150/90 | HR 82

## 2022-04-09 DIAGNOSIS — K219 Gastro-esophageal reflux disease without esophagitis: Secondary | ICD-10-CM | POA: Diagnosis not present

## 2022-04-09 DIAGNOSIS — Z1211 Encounter for screening for malignant neoplasm of colon: Secondary | ICD-10-CM | POA: Diagnosis not present

## 2022-04-09 DIAGNOSIS — Z1212 Encounter for screening for malignant neoplasm of rectum: Secondary | ICD-10-CM | POA: Diagnosis not present

## 2022-04-09 NOTE — Progress Notes (Signed)
    Assessment     CRC screening average risk.   Incidental hematochezia, likely hemorrhoidal, resolved GERD, well controlled   Recommendations    Schedule screening colonoscopy. The risks (including bleeding, perforation, infection, missed lesions, medication reactions and possible hospitalization or surgery if complications occur), benefits, and alternatives to colonoscopy with possible biopsy and possible polypectomy were discussed with the patient and they consent to proceed.   Preparation H (OTC) PR daily as needed for hemorrhoidal symptoms Continue Nexium 20 mg daily, TUMS prn and follow antireflux measures   HPI    This is a 51 year old Robert [name] who was referred by Willette Alma, MD for [facility] screening, average risk.  He was seen in Feb. 2023 with intermittent small-volume hematochezia however it resolved several months ago.  His reflux symptoms are currently well controlled on daily Nexium. Denies weight loss, abdominal pain, constipation, diarrhea, change in stool caliber, melena, nausea, vomiting, dysphagia, chest pain.    Labs / Imaging       Latest Ref Rng & Units 11/03/2021   10:25 AM 04/13/2021   11:55 AM 12/06/2020   10:46 AM  Hepatic Function  Total Protein 6.0 - 8.3 g/dL 6.6  7.0  6.7   Albumin 3.5 - 5.2 g/dL 4.5  4.5  4.1   AST 0 - 37 U/L _0 ALT 0 - 53 U/L _1 Alk Phosphatase 39 - 117 U/L 68  67  56   Total Bilirubin 0.2 - 1.2 mg/dL 0.7  0.7  0.7        Latest Ref Rng & Units 11/03/2021   10:25 AM 04/13/2021   11:55 AM 12/06/2020   10:46 AM  CBC  WBC 4.0 - 10.5 K/uL 5.7  6.3  5.6   Hemoglobin 13.0 - 17.0 g/dL 14.6  15.7  15.5   Hematocrit 39.0 - Robert.0 % 43.3  46.7  45.5   Platelets 150.0 - 400.0 K/uL 205.0  209.0  196.0      DG Lumbar Spine Complete  Result Date: 04/08/2022 CLINICAL DATA:  Low back pain no recent injury, initial encounter EXAM: LUMBAR SPINE - COMPLETE 4+ VIEW COMPARISON:  12/05/2020 FINDINGS: Six non rib-bearing lumbar vertebra  are noted. Multilevel osteophytic changes are seen. No pars defects are noted. No anterolisthesis is seen. Vertebral body height is well maintained. No soft tissue abnormality is noted. IMPRESSION: Stable multilevel degenerative change.  No acute abnormality noted. Electronically Signed   By: Inez Catalina M.D.   On: 04/08/2022 00:17    Current Medications, Allergies, Past Medical History, Past Surgical History, Family History and Social History were reviewed in [employer] record.   Physical Exam: General: Well developed, well nourished, no acute distress Head: Normocephalic and atraumatic Eyes: Sclerae anicteric, EOMI Ears: Normal auditory acuity Mouth: Not examined Lungs: Clear throughout to auscultation Heart: Regular rate and rhythm; no murmurs, rubs or bruits Abdomen: Soft, non tender and non distended. No masses, hepatosplenomegaly or hernias noted. Normal Bowel sounds Rectal: Deferred to colonoscopy Musculoskeletal: Symmetrical with no gross deformities  Pulses:  Normal pulses noted Extremities: No clubbing, cyanosis, edema or deformities noted Neurological: Alert oriented x 4, grossly nonfocal Psychological:  Alert and cooperative. Normal mood and affect   Collie Kittel T. Fuller Plan, MD 04/09/2022, 3:30 PM

## 2022-04-09 NOTE — Patient Instructions (Signed)
You have been scheduled for a colonoscopy. Please follow written instructions given to you at your visit today.  Please pick up your prep supplies at the pharmacy within the next 1-3 days. If you use inhalers (even only as needed), please bring them with you on the day of your procedure.  The [facility] providers would like to encourage you to use MYCHART to communicate with providers for non-urgent requests or questions.  Due to long hold times on the telephone, sending your provider a message by MYCHART may be a faster and more efficient way to get a response.  Please allow 48 business hours for a response.  Please remember that this is for non-urgent requests.   Due to recent changes in healthcare laws, you may see the results of your imaging and laboratory studies on MyChart before your provider has had a chance to review them.  We understand that in some cases there may be results that are confusing or concerning to you. Not all laboratory results come back in the same time frame and the provider may be waiting for multiple results in order to interpret others.  Please give us 48 hours in order for your provider to thoroughly review all the results before contacting the office for clarification of your results.   Thank you for choosing me and [facility].  Malcolm T. Stark, Jr., MD., FACG  

## 2022-04-27 ENCOUNTER — Other Ambulatory Visit: Payer: Self-pay | Admitting: Family Medicine

## 2022-04-27 DIAGNOSIS — I152 Hypertension secondary to endocrine disorders: Secondary | ICD-10-CM

## 2022-04-27 DIAGNOSIS — I10 Essential (primary) hypertension: Secondary | ICD-10-CM

## 2022-04-27 DIAGNOSIS — E669 Obesity, unspecified: Secondary | ICD-10-CM

## 2022-04-27 DIAGNOSIS — E785 Hyperlipidemia, unspecified: Secondary | ICD-10-CM

## 2022-04-27 DIAGNOSIS — Z0000 Encounter for general adult medical examination without abnormal findings: Secondary | ICD-10-CM

## 2022-05-09 ENCOUNTER — Ambulatory Visit (AMBULATORY_SURGERY_CENTER): Payer: BLUE CROSS/BLUE SHIELD | Admitting: Gastroenterology

## 2022-05-09 ENCOUNTER — Other Ambulatory Visit (INDEPENDENT_AMBULATORY_CARE_PROVIDER_SITE_OTHER): Payer: BLUE CROSS/BLUE SHIELD

## 2022-05-09 ENCOUNTER — Other Ambulatory Visit: Payer: Self-pay

## 2022-05-09 ENCOUNTER — Encounter: Payer: Self-pay | Admitting: Gastroenterology

## 2022-05-09 ENCOUNTER — Other Ambulatory Visit: Payer: Self-pay | Admitting: Gastroenterology

## 2022-05-09 VITALS — BP 149/78 | HR 60 | Temp 97.5°F | Resp 12 | Ht 75.0 in | Wt 280.6 lb

## 2022-05-09 DIAGNOSIS — D649 Anemia, unspecified: Secondary | ICD-10-CM

## 2022-05-09 DIAGNOSIS — K6289 Other specified diseases of anus and rectum: Secondary | ICD-10-CM

## 2022-05-09 DIAGNOSIS — C20 Malignant neoplasm of rectum: Secondary | ICD-10-CM

## 2022-05-09 DIAGNOSIS — D123 Benign neoplasm of transverse colon: Secondary | ICD-10-CM

## 2022-05-09 DIAGNOSIS — Z1212 Encounter for screening for malignant neoplasm of rectum: Secondary | ICD-10-CM

## 2022-05-09 DIAGNOSIS — Z1211 Encounter for screening for malignant neoplasm of colon: Secondary | ICD-10-CM | POA: Diagnosis present

## 2022-05-09 DIAGNOSIS — K621 Rectal polyp: Secondary | ICD-10-CM | POA: Diagnosis not present

## 2022-05-09 DIAGNOSIS — D128 Benign neoplasm of rectum: Secondary | ICD-10-CM

## 2022-05-09 LAB — COMPREHENSIVE METABOLIC PANEL
ALT: 20 U/L (ref 0–53)
AST: 17 U/L (ref 0–37)
Albumin: 4.1 g/dL (ref 3.5–5.2)
Alkaline Phosphatase: 66 U/L (ref 39–117)
BUN: 10 mg/dL (ref 6–23)
CO2: 29 mEq/L (ref 19–32)
Calcium: 9.1 mg/dL (ref 8.4–10.5)
Chloride: 104 mEq/L (ref 96–112)
Creatinine, Ser: 1.12 mg/dL (ref 0.40–1.50)
GFR: 75.16 mL/min (ref >60.00)
Glucose, Bld: 122 mg/dL — ABNORMAL HIGH (ref 70–99)
Potassium: 4.2 mEq/L (ref 3.5–5.1)
Sodium: 139 mEq/L (ref 135–145)
Total Bilirubin: 0.7 mg/dL (ref 0.2–1.2)
Total Protein: 6.7 g/dL (ref 6.0–8.3)

## 2022-05-09 LAB — CBC WITH DIFFERENTIAL/PLATELET
Basophils Absolute: 0.1 10*3/uL (ref 0.0–0.1)
Basophils Relative: 1 % (ref 0.0–3.0)
Eosinophils Absolute: 0.3 10*3/uL (ref 0.0–0.7)
Eosinophils Relative: 4 % (ref 0.0–5.0)
HCT: 38 % — ABNORMAL LOW (ref 39.0–52.0)
Hemoglobin: 12.5 g/dL — ABNORMAL LOW (ref 13.0–17.0)
Lymphocytes Relative: 35.3 % (ref 12.0–46.0)
Lymphs Abs: 2.4 10*3/uL (ref 0.7–4.0)
MCHC: 32.8 g/dL (ref 30.0–36.0)
MCV: 80.6 fl (ref 78.0–100.0)
Monocytes Absolute: 0.4 10*3/uL (ref 0.1–1.0)
Monocytes Relative: 6.6 % (ref 3.0–12.0)
Neutro Abs: 3.6 10*3/uL (ref 1.4–7.7)
Neutrophils Relative %: 53.1 % (ref 43.0–77.0)
Platelets: 247 10*3/uL (ref 150.0–400.0)
RBC: 4.71 Mil/uL (ref 4.22–5.81)
RDW: 14.8 % (ref 11.5–15.5)
WBC: 6.8 10*3/uL (ref 4.0–10.5)

## 2022-05-09 MED ORDER — SODIUM CHLORIDE 0.9 % IV SOLN: 500.0000 mL | Freq: Once | INTRAVENOUS | Status: DC

## 2022-05-09 NOTE — Progress Notes (Signed)
Called to room to assist during endoscopic procedure.  Patient ID and intended procedure confirmed with present staff. Received instructions for my participation in the procedure from the performing physician.  

## 2022-05-09 NOTE — Patient Instructions (Signed)
Handout on polyps and hemorrhoids given to patient. Await pathology results. Go downstairs to basement for lab work today (CBC, CMP, CEA today) CT abdomen/pelvis will be ordered  Referral to colorectal surgeon at appointment to be scheduled Referral to oncologist at appointment to be scheduled    YOU HAD AN ENDOSCOPIC PROCEDURE TODAY AT [place]:   Refer to the procedure report that was given to you for any specific questions about what was found during the examination.  If the procedure report does not answer your questions, please call your gastroenterologist to clarify.  If you requested that your care partner not be given the details of your procedure findings, then the procedure report has been included in a sealed envelope for you to review at your convenience later.  YOU SHOULD EXPECT: Some feelings of bloating in the abdomen. Passage of more gas than usual.  Walking can help get rid of the air that was put into your GI tract during the procedure and reduce the bloating. If you had a lower endoscopy (such as a colonoscopy or flexible sigmoidoscopy) you may notice spotting of blood in your stool or on the toilet paper. If you underwent a bowel prep for your procedure, you may not have a normal bowel movement for a few days.  Please Note:  You might notice some irritation and congestion in your nose or some drainage.  This is from the oxygen used during your procedure.  There is no need for concern and it should clear up in a day or so.  SYMPTOMS TO REPORT IMMEDIATELY:  Following lower endoscopy (colonoscopy or flexible sigmoidoscopy):  Excessive amounts of blood in the stool  Significant tenderness or worsening of abdominal pains  Swelling of the abdomen that is new, acute  Fever of 100F or higher  For urgent or emergent issues, a gastroenterologist can be reached at any hour by calling [phone]. Do not use MyChart messaging for urgent concerns.    DIET:  We  do recommend a small meal at first, but then you may proceed to your regular diet.  Drink plenty of fluids but you should avoid alcoholic beverages for 24 hours.  ACTIVITY:  You should plan to take it easy for the rest of today and you should NOT DRIVE or use heavy machinery until tomorrow (because of the sedation medicines used during the test).    FOLLOW UP: Our staff will call the number listed on your records the next business day following your procedure.  We will call around 7:15- 8:00 am to check on you and address any questions or concerns that you may have regarding the information given to you following your procedure. If we do not reach you, we will leave a message.     If any biopsies were taken you will be contacted by phone or by letter within the next 1-3 weeks.  Please call us at [phone] if you have not heard about the biopsies in 3 weeks.    SIGNATURES/CONFIDENTIALITY: You and/or your care partner have signed paperwork which will be entered into your electronic medical record.  These signatures attest to the fact that that the information above on your After Visit Summary has been reviewed and is understood.  Full responsibility of the confidentiality of this discharge information lies with you and/or your care-partner.

## 2022-05-09 NOTE — Progress Notes (Signed)
To pacu, VSS. Report to [email or web address] 

## 2022-05-09 NOTE — Op Note (Signed)
[facility] Patient Name: Robert [name] Procedure Date: 05/09/2022 1:54 PM MRN: [id number] Endoscopist: Ladene Artist , MD Age: 51 Referring MD:  Date of Birth: 1971/08/08 Gender: Male Account #: [id number] Procedure:                Colonoscopy Indications:              Screening for colorectal malignant neoplasm Medicines:                Monitored Anesthesia Care Procedure:                Pre-Anesthesia Assessment:                           - Prior to the procedure, a History and Physical                            was performed, and patient medications and                            allergies were reviewed. The patient's tolerance of                            previous anesthesia was also reviewed. The risks                            and benefits of the procedure and the sedation                            options and risks were discussed with the patient.                            All questions were answered, and informed consent                            was obtained. Prior Anticoagulants: The patient has                            taken no previous anticoagulant or antiplatelet                            agents. ASA Grade Assessment: II - A patient with                            mild systemic disease. After reviewing the risks                            and benefits, the patient was deemed in                            satisfactory condition to undergo the procedure.                           After obtaining informed consent, the colonoscope  was passed under direct vision. Throughout the                            procedure, the patient's blood pressure, pulse, and                            oxygen saturations were monitored continuously. The                            CF [id number] #[id number] was introduced through the anus                            and advanced to the the cecum, identified by                            appendiceal orifice  and ileocecal valve. The                            ileocecal valve, appendiceal orifice, and rectum                            were photographed. The quality of the bowel                            preparation was good. The colonoscopy was performed                            without difficulty. The patient tolerated the                            procedure well. Scope In: 2:01:11 PM Scope Out: 2:20:57 PM Scope Withdrawal Time: 0 hours 17 minutes 48 seconds  Total Procedure Duration: 0 hours 19 minutes 46 seconds  Findings:                 The perianal and digital rectal examinations were                            normal.                           Five sessile polyps were found in the rectum (1),                            transverse colon (1) and hepatic flexure (3). The                            polyps were 5 to 7 mm in size. These polyps were                            removed with a cold snare. Resection and retrieval                            were complete.  A fungating non-obstructing medium-sized mass was                            found in the proximal rectum, 10-13 cm from the                            anal verge. The mass was partially circumferential                            (involving one-third of the lumen circumference).                            The mass measured three cm in length. In addition,                            its diameter measured three cm. No bleeding was                            present. Mucosa was biopsied with a cold forceps                            for histology. One specimen bottle was sent to                            pathology. Two area proximal to the mass were                            injected, tattooed with 4 mL Spot (carbon black)                            for localization.                           Internal hemorrhoids were found during                            retroflexion. The hemorrhoids were small and  Grade                            I (internal hemorrhoids that do not prolapse).                           The exam was otherwise without abnormality on                            direct and retroflexion views. Complications:            No immediate complications. Estimated blood loss:                            None. Estimated Blood Loss:     Estimated blood loss: none. Impression:               - Five 5 to 7 mm polyps in the rectum, in the  transverse colon and at the hepatic flexure,                            removed with a cold snare. Resected and retrieved.                           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                            Submucosal injection tattoo.                           - Internal hemorrhoids.                           - The examination was otherwise normal on direct                            and retroflexion views. Recommendation:           - Repeat colonoscopy after studies are complete for                            surveillance based on pathology results.                           - Patient has a contact number available for                            emergencies. The signs and symptoms of potential                            delayed complications were discussed with the                            patient. Return to normal activities tomorrow.                            Written discharge instructions were provided to the                            patient.                           - Resume previous diet.                           - Continue present medications.                           - Await pathology results.                           - CBC, CMP, CEA today.                           - Perform CT scan (computed tomography) of  the                            chest, abdomen, pelvis with contrast at appointment                            to be scheduled.                           - Referral to a colorectal  surgeon at appointment                            to be scheduled.                           - Refer to an oncologist at appointment to be                            scheduled. Ladene Artist, MD 05/09/2022 2:29:51 PM This report has been signed electronically.

## 2022-05-09 NOTE — Progress Notes (Signed)
Pt's states no medical or surgical changes since previsit or office visit. 

## 2022-05-09 NOTE — Progress Notes (Signed)
See 04/09/2022 H&P, no changes

## 2022-05-10 ENCOUNTER — Other Ambulatory Visit: Payer: Self-pay

## 2022-05-10 ENCOUNTER — Telehealth: Payer: Self-pay

## 2022-05-10 ENCOUNTER — Telehealth: Payer: Self-pay | Admitting: *Deleted

## 2022-05-10 DIAGNOSIS — K6289 Other specified diseases of anus and rectum: Secondary | ICD-10-CM

## 2022-05-10 LAB — IBC + FERRITIN
Ferritin: 7.5 ng/mL — ABNORMAL LOW (ref 22.0–322.0)
Iron: 55 ug/dL (ref 42–165)
Saturation Ratios: 11.5 % — ABNORMAL LOW (ref 20.0–50.0)
TIBC: 480.2 ug/dL — ABNORMAL HIGH (ref 250.0–450.0)
Transferrin: 343 mg/dL (ref 212.0–360.0)

## 2022-05-10 LAB — B12 AND FOLATE PANEL
Folate: 12.5 ng/mL (ref >5.9)
Vitamin B-12: 438 pg/mL (ref 211–911)

## 2022-05-10 LAB — CEA: CEA: <2 ng/mL

## 2022-05-10 NOTE — Telephone Encounter (Signed)
  Follow up Call-     05/09/2022    1:17 PM  Call back number  Post procedure Call Back phone  # [phone]  Permission to leave phone message Yes     Patient questions:  Do you have a fever, pain , or abdominal swelling? No. Pain Score  0 *  Have you tolerated food without any problems? Yes.    Have you been able to return to your normal activities? Yes.    Do you have any questions about your discharge instructions: Diet   No. Medications  No. Follow up visit  No.  Do you have questions or concerns about your Care? No.  Actions: * If pain score is 4 or above: No action needed, pain <4.

## 2022-05-10 NOTE — Telephone Encounter (Signed)
Referrals placed for both oncology & CCS. CT scan ordered & schedulers notified.

## 2022-05-11 ENCOUNTER — Telehealth: Payer: Self-pay | Admitting: Nurse Practitioner

## 2022-05-11 NOTE — Telephone Encounter (Signed)
Scheduled appt per 9/28 referral. Pt is aware of appt date and time. Pt is aware to arrive 15 mins prior to appt time and to bring and updated insurance card. Pt is aware of appt location.   

## 2022-05-14 ENCOUNTER — Other Ambulatory Visit: Payer: Self-pay

## 2022-05-14 MED ORDER — IRON (FERROUS SULFATE) 325 (65 FE) MG PO TABS: 325.0000 mg | ORAL_TABLET | Freq: Two times a day (BID) | ORAL | 2 refills | Status: DC

## 2022-05-17 ENCOUNTER — Ambulatory Visit (HOSPITAL_COMMUNITY)
Admission: RE | Admit: 2022-05-17 | Discharge: 2022-05-17 | Disposition: A | Discharge status: Home / Self Care | Payer: BLUE CROSS/BLUE SHIELD | Source: Ambulatory Visit | Attending: Gastroenterology | Admitting: Gastroenterology

## 2022-05-17 ENCOUNTER — Encounter (HOSPITAL_COMMUNITY): Payer: Self-pay

## 2022-05-17 DIAGNOSIS — K6289 Other specified diseases of anus and rectum: Secondary | ICD-10-CM | POA: Diagnosis present

## 2022-05-17 MED ORDER — IOHEXOL 300 MG/ML  SOLN
100.0000 mL | Freq: Once | INTRAMUSCULAR | Status: AC | PRN
  Administered 2022-05-17: 100 mL via INTRAVENOUS

## 2022-05-17 MED ORDER — SODIUM CHLORIDE (PF) 0.9 % IJ SOLN
INTRAMUSCULAR | Status: AC
  Filled 2022-05-17: qty 50

## 2022-05-18 ENCOUNTER — Other Ambulatory Visit: Payer: Self-pay

## 2022-05-18 DIAGNOSIS — Q254 Congenital malformation of aorta unspecified: Secondary | ICD-10-CM

## 2022-05-18 DIAGNOSIS — R9389 Abnormal findings on diagnostic imaging of other specified body structures: Secondary | ICD-10-CM

## 2022-05-22 NOTE — Progress Notes (Signed)
[facility]   [phone] Fax:([phone] [place] Note   Patient Care Team: [name], MD as PCP - General (Family Medicine) Truitt Merle, MD as Consulting Physician (Oncology) Alla Feeling, NP as Nurse Practitioner (Oncology) Royston Bake, RN as Oncology Nurse Navigator (Oncology) Date of service 05/23/2022  CHIEF COMPLAINTS/PURPOSE OF CONSULTATION:  Rectal cancer, referred by GI Dr. Lucio Edward  SUMMARY OF ONCOLOGY HISTORY  Oncology History  Rectal cancer ([place])  05/09/2022 Procedure   Colonoscopy by Dr. Fuller Plan, impression - Five 5 to 7 mm polyps in the rectum, in the transverse colon and at the hepatic flexure, removed with a cold snare. Resected and retrieved. removed with a cold snare. Resected and retrieved. 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 Submucosal injection tattoo. - Internal hemorrhoids. - The examination was otherwise normal on direct and retroflexion views.   05/09/2022 Initial Biopsy   Diagnosis 1. Surgical [P], colon, transverse x1 and hepatic flexure x 3 (2 pieces only, 2 stuck together taken off together), polyp (4) - TUBULAR ADENOMA(S) - NEGATIVE FOR HIGH-GRADE DYSPLASIA OR MALIGNANCY 2. Surgical [P], colon, rectal mass - INVASIVE MODERATELY DIFFERENTIATED ADENOCARCINOMA. SEE NOTE 3. Surgical [P], colon, rectum, polyp (1) - HYPERPLASTIC POLYP   05/09/2022 Tumor Marker   CEA < 2.0   05/17/2022 Imaging   IMPRESSION: 1. Known primary malignancy is not well visualized. Correlate with recent colonoscopy. 2. No evidence of metastatic disease in the chest, abdomen or pelvis. 3. Mildly enlarged bilateral inguinal lymph nodes, likely reactive. 4. 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 Recommend annual imaging followup by CTA or MRA. This recommendation follows 2010 ACCF/AHA/AATS/ACR/ASA/SCA/SCAI/SIR/STS/SVM Guidelines for the Diagnosis and Management of Patients with Thoracic  Aortic Disease. Circulation. 2010; 121: D322-G254. 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 Severe coronary artery calcifications of the LAD and circumflex. 6. Aortic Atherosclerosis (ICD10-I70.0).   05/23/2022 Initial Diagnosis   Rectal cancer ([facility])     HISTORY OF PRESENTING ILLNESS:  Robert [name] 51 y.o. male with PMH including DM (A1c 7.2), HL, HTN, GERD, migraines, and arthralgia is here because of newly diagnosed rectal cancer.  Initially seen by Dr. Fuller Plan 10/04/2021 for hematochezia and a history of intermittent dark red blood with bowel movements that began a year ago. Endoscopies were not done at that time.  He was referred back by PCP and underwent colonoscopy 05/09/2022 which showed 5 sessile polyps in the rectum, transverse colon, and hepatic flexure as well as a fungating nonobstructing medium-sized mass in the proximal rectum 10-13 cm from the anal verge.  The mass was partially circumferential measuring 3 cm in length, no bleeding was present.  Biopsy of the rectal mass showed invasive moderately differentiated adenocarcinoma.  Baseline CEA is normal, < 2.0.  He has lab evidence of mild iron deficiency and anemia, Hgb 12.5, elevated TIBC at 480, and low ferritin of 7.5. tolerating oral iron x1 week. Staging CT CAP 05/17/2022 showed the primary tumor not well visualized, mildly enlarged bilateral inguinal lymph nodes up to 1.1 cm felt to be likely reactive, a 4.3 cm ascending thoracic aorta, severe coronary artery calcifications, and otherwise negative for metastatic disease in the chest, abdomen, or pelvis.  He was referred to colorectal surgery in oncology for further eval and management.  Socially he is married with 4 children ages 71 through 68.  He works with [employer], wife works in a Soil scientist.  He is independent with ADLs and drives.  He  has not had a prior colonoscopy or PSA.  He previously was a moderate alcohol drinker, less now maybe 2 beers per day.  He quit smoking cigarettes  more than 20 years ago, smoked 1-2 packs/day x 10 years.  Denies other drug use.  Family history significant for a father who is deceased from cancer initially in the bone, then brain and metastasized to the sinus cavity.  His maternal grandmother had cancer of unknown type.  Denies family history of colorectal, breast, ovarian, or pancreatic cancers.  Today he presents with his wife.  He has 1-1.5 years of incomplete bowel emptying and bloating with intermittent dark blood mixed in stool.  Denies abdominal or rectal pain, decreased appetite, unintentional weight loss, fatigue, or any other new specific complaints.   MEDICAL HISTORY:  Past Medical History:  Diagnosis Date   Arthralgia 01/11/2013   Diffuse, b/l Hips R>L Knees, ankles, low back   Back pain 12/08/2015   Diabetes mellitus type 2 in obese ([place]) 11/09/2012   Dyslipidemia 11/09/2012   Esophageal reflux 12/08/2015   History of migraine headaches    Hyperglycemia 11/09/2012   Hypertension     SURGICAL HISTORY: Past Surgical History:  Procedure Laterality Date   NO PAST SURGERIES      SOCIAL HISTORY: Social History   Socioeconomic History   Marital status: Married    Spouse name: Not on file   Number of children: 4   Years of education: Not on file   Highest education level: Not on file  Occupational History   Occupation: Training and development officer    Employer: [employer]  Tobacco Use   Smoking status: Former    Packs/day: 1.50    Years: 10.00    Total pack years: 15.00    Types: Cigarettes    Quit date: 08/14/1995    Years since quitting: 26.7   Smokeless tobacco: Never  Vaping Use   Vaping Use: Never used  Substance and Sexual Activity   Alcohol use: Yes    Alcohol/week: 6.0 - 10.0 standard drinks of alcohol    Types: 6 - 10 Cans of beer per week    Comment: 2/day, more in the past   Drug use: No   Sexual activity: Yes    Comment: lives with wife and daughter, no dietary restrictions  Other Topics Concern   Not on  file  Social History Narrative   Regular exercise:  Walks 1-2 x weekly   Caffeine use:  3-4 daily   4 children- oldest first marriage 71 (son), 74 son, 24 son, 43 yr old girl   Married   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 [place] Strain: Not on [employer] Insecurity: Not on file  Transportation Needs: Not on file  Physical Activity: Not on file  Stress: Not on file  Social Connections: Not on file  Intimate Partner Violence: Not on file    FAMILY HISTORY: Family History  Problem Relation Age of Onset   Arthritis Mother    Hypertension Mother    Diabetes Mother    Cancer Mother    Arthritis Father    Hypertension Father    Diabetes Father    Sleep apnea Father    Obesity Father    Arthritis Maternal Grandmother    Diabetes Maternal Grandmother    Stroke Maternal Grandmother    Arthritis Paternal Grandmother  Diabetes Paternal Grandmother    Cancer Paternal Grandmother        lung, smoker   Diabetes Paternal Grandfather    Heart disease Paternal Grandfather 56       MI   Hyperlipidemia Brother    Hypertension Brother    Diabetes Brother    COPD Maternal Grandfather    Kidney disease Neg Hx     ALLERGIES:  is allergic to mucinex [guaifenesin er], nsaids, and latex.  MEDICATIONS:  Current Outpatient Medications  Medication Sig Dispense Refill   amLODipine (NORVASC) 5 MG tablet TAKE 1 TABLET (5 MG TOTAL) BY MOUTH DAILY. 90 tablet 1   carvedilol (COREG) 12.5 MG tablet Take 1 tablet (12.5 mg total) by mouth 2 (two) times daily with a meal. 180 tablet 0   esomeprazole (NEXIUM) 20 MG capsule Take 20 mg by mouth daily.     glucose blood (COOL BLOOD GLUCOSE TEST STRIPS) test strip Check blood sugar once daily 100 each 12   Iron, Ferrous Sulfate, 325 (65 Fe) MG TABS Take 325 mg by mouth 2 (two) times daily with a meal. 30 tablet 2   losartan (COZAAR) 100 MG tablet TAKE 1 TABLET BY MOUTH EVERY DAY  90 tablet 1   metFORMIN (GLUCOPHAGE) 500 MG tablet Take 1 tablet (500 mg total) by mouth daily with breakfast. 90 tablet 0   tiZANidine (ZANAFLEX) 2 MG tablet Take 0.5-2 tablets (1-4 mg total) by mouth 2 (two) times daily as needed for muscle spasms. 30 tablet 5   Current Facility-Administered Medications  Medication Dose Route Frequency Provider Last Rate Last Admin   0.9 %  sodium chloride infusion  500 mL Intravenous Once Ladene Artist, MD        REVIEW OF SYSTEMS:   Constitutional: Denies fatigue, unintentional weight loss, fevers, chills or abnormal night sweats Eyes: Denies blurriness of vision, double vision or watery eyes Ears, nose, mouth, throat, and face: Denies mucositis or sore throat Respiratory: Denies cough, dyspnea or wheezes Cardiovascular: Denies palpitation, chest discomfort or lower extremity swelling Gastrointestinal:  Denies nausea, vomiting, diarrhea, heartburn or change in bowel habits (+) hematochezia (+) incomplete bowel movements (+) bloating Skin: Denies abnormal skin rashes Lymphatics: Denies new lymphadenopathy or easy bruising Neurological:Denies numbness, tingling or new weaknesses Behavioral/Psych: Mood is stable, no new changes  All other systems were reviewed with the patient and are negative.  PHYSICAL EXAMINATION: ECOG PERFORMANCE STATUS: 1 - Symptomatic but completely ambulatory  Vitals:   05/23/22 1119  BP: (!) 149/84  Pulse: 65  Resp: 14  Temp: 97.7 F (36.5 C)  SpO2: 96%   Filed Weights   05/23/22 1119  Weight: 284 lb (128.8 kg)    GENERAL:alert, no distress and comfortable SKIN: No rash EYES: sclera clear NECK: Without mass LYMPH:  no palpable inguinal lymphadenopathy LUNGS: clear with normal breathing effort HEART: regular rate & rhythm, trace bilateral ankle edema ABDOMEN:abdomen soft, non-tender and normal bowel sounds Musculoskeletal:no cyanosis of digits and no clubbing  PSYCH: alert & oriented x 3 with fluent  speech NEURO: no focal motor/sensory deficits Rectal: External exam reveals no mass.  Normal sphincter tone.  Palpable mass in the mid rectum.  Some blood on glove   LABORATORY DATA:  I have reviewed the data as listed    Latest Ref Rng & Units 05/09/2022    3:02 PM 11/03/2021   10:25 AM 04/13/2021   11:55 AM  CBC  WBC 4.0 - 10.5 K/uL 6.8  5.7  6.3  Hemoglobin 13.0 - 17.0 g/dL 12.5  14.6  15.7   Hematocrit 39.0 - 52.0 % 38.0  43.3  46.7   Platelets 150.0 - 400.0 K/uL 247.0  205.0  209.0        Latest Ref Rng & Units 05/09/2022    3:02 PM 11/03/2021   10:25 AM 04/13/2021   11:55 AM  CMP  Glucose 70 - 99 mg/dL 122  137  128   BUN 6 - 23 mg/dL $Remove'10  16  12   'rwliaHi$ Creatinine 0.40 - 1.50 mg/dL 1.12  1.07  1.16   Sodium 135 - 145 mEq/L 139  141  139   Potassium 3.5 - 5.1 mEq/L 4.2  4.4  4.5   Chloride 96 - 112 mEq/L 104  105  103   CO2 19 - 32 mEq/L $Remove'29  30  29   'LltGCTL$ Calcium 8.4 - 10.5 mg/dL 9.1  9.4  9.8   Total Protein 6.0 - 8.3 g/dL 6.7  6.6  7.0   Total Bilirubin 0.2 - 1.2 mg/dL 0.7  0.7  0.7   Alkaline Phos 39 - 117 U/L 66  68  67   AST 0 - 37 U/L $Remo'17  22  21   'CwBWx$ ALT 0 - 53 U/L $Remo'20  24  24      'RyPqf$ RADIOGRAPHIC STUDIES: I have personally reviewed the radiological images as listed and agreed with the findings in the report. CT CHEST ABDOMEN PELVIS W CONTRAST  Result Date: 05/18/2022 CLINICAL DATA:  Colon cancer staging; * Tracking Code: BO * EXAM: CT CHEST, ABDOMEN, AND PELVIS WITH CONTRAST TECHNIQUE: Multidetector CT imaging of the chest, abdomen and pelvis was performed following the standard protocol during bolus administration of intravenous contrast. RADIATION DOSE REDUCTION: This exam was performed according to the departmental dose-optimization program which includes automated exposure control, adjustment of the mA and/or kV according to patient size and/or use of iterative reconstruction technique. CONTRAST:  157mL OMNIPAQUE IOHEXOL 300 MG/ML  SOLN COMPARISON:  None Available. FINDINGS: CT  CHEST FINDINGS Cardiovascular: Normal heart size. No pericardial effusion. 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 Mild atherosclerotic disease of the thoracic aorta. Severe coronary artery calcifications of the LAD and circumflex. Mediastinum/Nodes: Esophagus and thyroid are unremarkable. No pathologically enlarged lymph nodes seen in the chest. Lungs/Pleura: Central airways are patent. No consolidation, pleural effusion or pneumothorax. Calcified nodule of the right middle lobe. No suspicious pulmonary nodules. Musculoskeletal: No chest wall mass or suspicious bone lesions identified. CT ABDOMEN PELVIS FINDINGS Hepatobiliary: No focal liver abnormality is seen. No gallstones, gallbladder wall thickening, or biliary dilatation. Pancreas: Unremarkable. No pancreatic ductal dilatation or surrounding inflammatory changes. Spleen: Normal in size without focal abnormality. Adrenals/Urinary Tract: Adrenal glands are unremarkable. Kidneys are normal, without renal calculi, focal lesion, or hydronephrosis. Bladder is unremarkable. Stomach/Bowel: Stomach is within normal limits. Appendix appears normal. No evidence of bowel wall thickening, distention, or inflammatory changes. Vascular/Lymphatic: Aortic atherosclerosis. No enlarged abdominal or pelvic lymph nodes. Mildly enlarged bilateral inguinal lymph nodes. Reference left inguinal lymph node measuring 1.1 cm in short axis on series 2, image 124. Reproductive: Prostate is unremarkable. Other: No abdominal wall hernia or abnormality. No abdominopelvic ascites. Musculoskeletal: No acute or significant osseous findings. IMPRESSION: 1. Known primary malignancy is not well visualized. Correlate with recent colonoscopy. 2. No evidence of metastatic disease in the chest, abdomen or pelvis. 3. Mildly enlarged bilateral inguinal lymph nodes, likely reactive. 4. 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 Recommend  annual imaging followup by CTA or  MRA. This recommendation follows 2010 ACCF/AHA/AATS/ACR/ASA/SCA/SCAI/SIR/STS/SVM Guidelines for the Diagnosis and Management of Patients with Thoracic Aortic Disease. Circulation. 2010; 121: J856-D149. 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 Severe coronary artery calcifications of the LAD and circumflex. 6. Aortic Atherosclerosis (ICD10-I70.0). Electronically Signed   By: Yetta Glassman M.D.   On: 05/18/2022 09:46    ASSESSMENT & PLAN: 51 year old male  Moderately differentiated adenocarcinoma of the rectum -We reviewed his medical record in detail with the patient and his wife.  He presented with 1-1.5 years of incomplete bowel movements and intermittent rectal bleeding, colonoscopy showed 5 polyps, internal hemorrhoids, and a biopsy-proven adenocarcinoma in the proximal rectum at 10-13 cm.  This feels more mid rectal on my exam -Baseline CEA is normal, staging work-up showed likely reactive bilateral inguinal lymph nodes, the primary was not well visualized, and no other metastatic disease.   -There were incidental findings of dilated thoracic aorta and severe coronary artery calcifications, he has been referred to cardiology and vascular specialists -He is being referred for pelvic MRI to complete staging, scheduled on 10/16, and will see colorectal surgeon Dr. Dema Severin later next week -We reviewed the various treatment strategies for rectal cancer.  If he has early localized T1 tumor he may proceed with upfront surgery.  If he has T2 or T3 and N0 disease he will be a candidate for neoadjuvant chemoradiation with Xeloda, but if found to have more locally advanced T4 or any N disease we would recommend total neoadjuvant treatment consisting of 4 months intravenous chemo (CapeOx versus FOLFOX) followed by chemoradiation with Xeloda, then surgery. -We will request MMR testing on the initial biopsy to see if he is eligible for immunotherapy or targeted therapy -We will review his case in our GI conference  next week and update the patient with recommendations, and refer to Dr. Lisbeth Renshaw if needed -Patient seen with Dr. Burr Medico and met GI navigator Ihor Gully today  2. Rectal bleeding and sensation of incomplete bowel emptying -Secondary to #1 -Onset 1-1.5 years ago -Symptoms are mild and intermittent -Cautioned that constipation can worsen with oral iron supplement and to monitor and start stool softener/laxative if needed  3. IDA -Secondary to #1 -Started oral iron 1 week ago, tolerating  -Discussed IV iron, logistics, potential benefit and side effects, he prefers to hold for now -Continue oral iron, take with vitamin C, and separate from GERD medication  4. Enlarged thoracic aorta and coronary artery calcifications -Seen on rectal cancer staging CT -He has been referred to vascular surgery, I also placed a cardiology referral for further eval/management  5. Chronic conditions: HTN, HL, DM, GERD, former tobacco and current alcohol use -On amlodipine, carvedilol, losartan, metformin, and esomeprazole -Last hemoglobin A1c 7.2 in 10/2021 -He quit smoking more than 20 years ago and has decreased his alcohol intake.  I encouraged him to continue to cut back/quit -PCP Dr. Willette Alma -He understands he may need cardiology/medical clearance prior to any rectal cancer surgery  PLAN: -Colonoscopy, CT, and labs reviewed -Proceed with pelvic MRI for local staging -Request MMR testing on initial biopsy -Continue oral iron, patient declined IV iron for now -Discuss case in GI conference and update the patient i.e. either surgery first or neoadjuvant treatment -Consult with Dr. Dema Severin next week  Orders Placed This Encounter  Procedures   MR PELVIS WO CM RECTAL CA STAGING    Standing Status:   Future    Standing Expiration Date:   05/24/2023  Order Specific Question:   If indicated for the ordered procedure, I authorize the administration of contrast media per Radiology protocol    Answer:   Yes     Order Specific Question:   What is the patient's sedation requirement?    Answer:   No Sedation    Order Specific Question:   Does the patient have a pacemaker or implanted devices?    Answer:   No    Order Specific Question:   Preferred imaging location?    Answer:   [facility] (table limit - 550 lbs)   Ambulatory referral to Cardiology    Referral Priority:   Routine    Referral Type:   Consultation    Referral Reason:   Specialty Services Required    Requested Specialty:   Cardiology    Number of Visits Requested:   1    All questions were answered. The patient knows to call the clinic with any problems, questions or concerns.      Alla Feeling, NP 05/24/2022   Addendum I have seen the patient, examined him. I agree with the assessment and and plan and have edited the notes.   51 yo male, with PMH of prediabetes, attention, obesity, presented with intermittent hematochezia.  I reviewed his colonoscopy, biopsy and staging CT scan findings with patient and his wife in detail.  I recommend pelvic MRI for staging.  I discussed role of neoadjuvant chemo and radiation, in total neoadjuvant therapy, for locally advanced rectal cancer.  We will call him back after the MRI, and see him back in the office if he needs total neoadjuvant therapy. Will check his appointment with our colorectal surgeons.  All questions were answered.  Truitt Merle MD 05/23/2022

## 2022-05-23 ENCOUNTER — Inpatient Hospital Stay: Payer: BLUE CROSS/BLUE SHIELD | Attending: Nurse Practitioner | Admitting: Nurse Practitioner

## 2022-05-23 ENCOUNTER — Encounter: Payer: Self-pay | Admitting: Nurse Practitioner

## 2022-05-23 ENCOUNTER — Telehealth: Payer: Self-pay

## 2022-05-23 ENCOUNTER — Other Ambulatory Visit: Payer: Self-pay

## 2022-05-23 VITALS — BP 149/84 | HR 65 | Temp 97.7°F | Resp 14 | Wt 284.0 lb

## 2022-05-23 DIAGNOSIS — E119 Type 2 diabetes mellitus without complications: Secondary | ICD-10-CM

## 2022-05-23 DIAGNOSIS — Q254 Congenital malformation of aorta unspecified: Secondary | ICD-10-CM

## 2022-05-23 DIAGNOSIS — K625 Hemorrhage of anus and rectum: Secondary | ICD-10-CM | POA: Diagnosis not present

## 2022-05-23 DIAGNOSIS — C20 Malignant neoplasm of rectum: Secondary | ICD-10-CM | POA: Diagnosis present

## 2022-05-23 DIAGNOSIS — I10 Essential (primary) hypertension: Secondary | ICD-10-CM

## 2022-05-23 DIAGNOSIS — R590 Localized enlarged lymph nodes: Secondary | ICD-10-CM

## 2022-05-23 DIAGNOSIS — D500 Iron deficiency anemia secondary to blood loss (chronic): Secondary | ICD-10-CM | POA: Diagnosis not present

## 2022-05-23 DIAGNOSIS — I2510 Atherosclerotic heart disease of native coronary artery without angina pectoris: Secondary | ICD-10-CM

## 2022-05-23 NOTE — Progress Notes (Signed)
I faxed writen request for MMR/MSI testing on rectal mass biopsy to [facility] at [phone].

## 2022-05-23 NOTE — Progress Notes (Signed)
I met with Mr Robert [name] and his wife after  his consultation with Cira Rue NP and  Dr Burr Medico.  I explained my role as a nurse navigator and provided my contact information. I explained the services provided at [facility] and provided written information.  I explained the alight grant and let  him know one of the financial advisors will reach out to  him at the time of his chemo education class. I told him that he will be scheduled for chemotherapy education class prior to receiving chemotherapy. I told them I will reach out to [facility] to get him scheduled for consultation with colorectal surgeon.  I told them he will be scheduled for an MR and will be called with that appt.  All questions were answered. They verbalized understanding.

## 2022-05-23 NOTE — Progress Notes (Signed)
I made a consultation appt with Dr Dema Severin at [facility] for 05/31/2022 at [place] arrive at 0910.  I provided this information and [facility] address to Mr Koval. I faxed his pathology report to [facility] as well.  I amd a MRI appt for 10/16 at 2100 arrive at 2030 to [facility] ED to register.  I instructed Mr Carlile to use a fleets enema 2 hours prior to the scan and to not eat or drink any thing after 5pm.  All questions were answered.  He verbalized understanding.

## 2022-05-23 NOTE — Telephone Encounter (Signed)
Update-Referral faxed to [facility].   Ladene Artist, MD  Carl Best, RN  Please reroute this referral.    Previous Messages    ----- Message -----  From: Merlene Laughter, RVS, RCS  Sent: 05/23/2022   3:26 PM EDT  To: Ladene Artist, MD  Subject: referral to wrong site                         Hello.  Here at [facility] our surgeons treat descending aortic aneurysms. Ascending aneurysms go to [facility].  Thank you

## 2022-05-24 ENCOUNTER — Encounter: Payer: Self-pay | Admitting: Nurse Practitioner

## 2022-05-24 ENCOUNTER — Encounter: Payer: Self-pay | Admitting: Hematology

## 2022-05-25 ENCOUNTER — Other Ambulatory Visit: Payer: Self-pay

## 2022-05-28 ENCOUNTER — Ambulatory Visit (HOSPITAL_COMMUNITY)
Admission: RE | Admit: 2022-05-28 | Discharge: 2022-05-28 | Disposition: A | Discharge status: Home / Self Care | Payer: BLUE CROSS/BLUE SHIELD | Source: Ambulatory Visit | Attending: Nurse Practitioner | Admitting: Nurse Practitioner

## 2022-05-28 DIAGNOSIS — C20 Malignant neoplasm of rectum: Secondary | ICD-10-CM | POA: Diagnosis present

## 2022-05-29 ENCOUNTER — Ambulatory Visit: Payer: BLUE CROSS/BLUE SHIELD | Admitting: Family Medicine

## 2022-05-30 ENCOUNTER — Other Ambulatory Visit: Payer: Self-pay

## 2022-05-30 ENCOUNTER — Encounter: Payer: Self-pay | Admitting: Nurse Practitioner

## 2022-05-30 NOTE — Progress Notes (Signed)
I called Robert [name] to review pelvic MRI which shows T1/T2, N0 rectal cancer; this is very early stage. However MRI can't tell whether T1 or T2 and the recommendation is to proceed with EUS for further surgical/treatment planning. Pt agrees.   He knows to expect an appointment for that soon and will see Dr. Dema Severin tomorrow. F/up with [place] is pending EUS and surgery plan.   He has no other questions and appreciates the call.   Cira Rue, NP

## 2022-05-30 NOTE — Progress Notes (Signed)
The proposed treatment discussed in conference is for discussion purpose only and is not a binding recommendation.  The patients have not been physically examined, or presented with their treatment options.  Therefore, final treatment plans cannot be decided.  

## 2022-05-31 ENCOUNTER — Other Ambulatory Visit (HOSPITAL_COMMUNITY): Payer: BLUE CROSS/BLUE SHIELD

## 2022-06-01 ENCOUNTER — Telehealth: Payer: Self-pay

## 2022-06-01 ENCOUNTER — Other Ambulatory Visit: Payer: Self-pay

## 2022-06-01 DIAGNOSIS — K6289 Other specified diseases of anus and rectum: Secondary | ICD-10-CM

## 2022-06-01 NOTE — Telephone Encounter (Signed)
EUS scheduled, pt instructed and medications reviewed.  Patient instructions mailed to home.  Patient to call with any questions or concerns.  

## 2022-06-01 NOTE — Telephone Encounter (Signed)
Lower EUS scheduled for 06/21/22 at 945 pm.   Left message on machine to call back

## 2022-06-01 NOTE — Telephone Encounter (Signed)
-----   Message from Irving Copas., MD sent at 05/31/2022  5:05 PM EDT ----- CW, Thanks for following up. GM  Myrla Malanowski, Please offer this patient the slot as outlined. GM ----- Message ----- From: Ileana Roup, MD Sent: 05/31/2022   5:01 PM EDT To: Ladene Artist, MD; Timothy Lasso, RN; #  Agreed. Thank you!  CW ----- Message ----- From: Irving Copas., MD Sent: 05/31/2022   4:55 PM EDT To: Ladene Artist, MD; Ileana Roup, MD; #  Team, As you can imagine it is quite difficult to get time slots when I am already booked 6 to 8 weeks in advance and with DJ out. However, I can offer the patient a procedure on 11/9 if that is amenable to everyone. If so please reply back.  Shirlean Berman, The previous patient that I told you to cancel for 11/9 at 3:45 PM, please go ahead and remove that patient and keep that slot on hold until we hear back from the oncology team above. Thanks. GM  ----- Message ----- From: Irving Copas., MD Sent: 05/31/2022   5:48 AM EDT To: Ladene Artist, MD; Ileana Roup, MD; #  [facility], Have not forgotten about this request.  I need to see what may be possible and rearranging some patients to get an earlier EUS slot.  I will reply back by the end of today with my nurse to work on trying to see what we can do for EUS staging. Thanks. GM  ----- Message ----- From: Alla Feeling, NP Sent: 05/30/2022   8:40 AM EDT To: Ladene Artist, MD; Ileana Roup, MD; #  Dr. Rush Landmark,  This is to f/up on the patient from conference this morning, T1/T2, N0 prox rectal ca. The recommendation is for EUS to delineate T1 or T2 for further surgery/treatment planning.  Thank you, Regan Rakers

## 2022-06-05 ENCOUNTER — Telehealth: Payer: Self-pay | Admitting: *Deleted

## 2022-06-05 NOTE — Telephone Encounter (Signed)
Pt already has a new pt appointment with Dr. Gasper Sells, 06/19/22.  I have already updated appointment notes for surgical clearance.  Surgeon's office is aware of pt's appointment and that clearance will be addressed at that time.      Pre-operative Risk Assessment    Patient Name: Robert [name]  DOB: Dec 04, 1970 MRN: [id number]      Request for Surgical Clearance    Procedure:   ROBOTIC ASSISTED LOW ANTERIOR RESECTION VS. TRANS ANAL EXCISION OF RECTAL CANCER, FLEXIBLE SIGMOIDOSCOPY, AND POSSIBLE DIVERTING LOOP ILEOSTOMY SURGERY  Date of Surgery:  Clearance TBD                                 Surgeon:  Nadeen Landau, MD Surgeon's Group or Practice Name:  [facility] Phone number:  [phone] Fax number:  [phone]  [place]:  [place]   Type of Clearance Requested:   - Medical    Type of Anesthesia:  General    Additional requests/questions:    SignedJeanann Lewandowsky   06/05/2022, 2:20 PM

## 2022-06-05 NOTE — Telephone Encounter (Signed)
Requesting party aware of patient appt. Will remove from preop pool.   Loel Dubonnet, NP

## 2022-06-07 ENCOUNTER — Encounter: Payer: Self-pay | Admitting: Gastroenterology

## 2022-06-13 ENCOUNTER — Encounter (HOSPITAL_COMMUNITY): Payer: Self-pay | Admitting: Gastroenterology

## 2022-06-18 NOTE — Progress Notes (Unsigned)
Cardiology Office Note:    Date:  06/19/2022   ID:  Robert [name], DOB [birth year], MRN [id number]  PCP:  [name], MD   [facility] Cardiologist:  Werner Lean, MD     Referring MD: [name], MD   CC: preop for colorectal cancer Consulted for the evaluation of incidental CAC at the behest of Dr. Charlett Blake  Planned for surgery (Dr. Rush Landmark and then Dr. Dema Severin)  History of Present Illness:    Robert [name] is a 51 y.o. male with a hx of HTN, HLD, DM, CAC. Mild aortic ectasia.  He has new colorectal cancer.  Planned   Patient notes that he is feeling tired.   He is dealing with the cancer diagnosis and they are coming with a surgical plan. Has a lot of arthritis and numbness and tingling in hands and feet.  Has had no chest pain, chest pressure, chest tightness, chest stinging. Has moments where he needs to pause in order to take a deep breath. Has no DOE, Just needs to take a deep breath. No SOB at rest.  No PND or orthopnea.  No weight gain, leg swelling. Has new abdominal bloating concurrent with his new diagnosis. No syncope or near syncope . Notes no palpitations or funny heart beats.     Saw a cardiologist in [place]- had negative stress test. Was told that he had essential hypertension.  Ambulatory BP: not done.   Past Medical History:  Diagnosis Date   Anemia    Arthralgia 01/11/2013   Diffuse, b/l Hips R>L Knees, ankles, low back   Arthritis    Back pain 12/08/2015   Cancer ([facility])    Diabetes mellitus type 2 in obese ([place]) 11/09/2012   Dyslipidemia 11/09/2012   Esophageal reflux 12/08/2015   History of migraine headaches    Hyperglycemia 11/09/2012   Hypertension     Past Surgical History:  Procedure Laterality Date   NO PAST SURGERIES      Current Medications: Current Meds  Medication Sig   amLODipine (NORVASC) 5 MG tablet TAKE 1 TABLET (5 MG TOTAL) BY MOUTH DAILY.   carvedilol (COREG) 25 MG tablet Take 1  tablet (25 mg total) by mouth 2 (two) times daily.   esomeprazole (NEXIUM) 20 MG capsule Take 20 mg by mouth daily.   glucose blood (COOL BLOOD GLUCOSE TEST STRIPS) test strip Check blood sugar once daily   Iron, Ferrous Sulfate, 325 (65 Fe) MG TABS Take 325 mg by mouth 2 (two) times daily with a meal.   losartan (COZAAR) 100 MG tablet TAKE 1 TABLET BY MOUTH EVERY DAY   metFORMIN (GLUCOPHAGE) 500 MG tablet Take 1 tablet (500 mg total) by mouth daily with breakfast.   tiZANidine (ZANAFLEX) 2 MG tablet Take 0.5-2 tablets (1-4 mg total) by mouth 2 (two) times daily as needed for muscle spasms.   [DISCONTINUED] carvedilol (COREG) 12.5 MG tablet Take 1 tablet (12.5 mg total) by mouth 2 (two) times daily with a meal.   Current Facility-Administered Medications for the 06/19/22 encounter (Office Visit) with Werner Lean, MD  Medication   0.9 %  sodium chloride infusion     Allergies:   Mucinex [guaifenesin er], Nsaids, and Latex   Social History   Socioeconomic History   Marital status: Married    Spouse name: Not on file   Number of children: 4   Years of education: Not on file   Highest education level: Not on file  Occupational  History   Occupation: Software engineer: [employer]  Tobacco Use   Smoking status: Former    Packs/day: 1.50    Years: 10.00    Total pack years: 15.00    Types: Cigarettes    Quit date: 08/14/1995    Years since quitting: 26.8   Smokeless tobacco: Never  Vaping Use   Vaping Use: Never used  Substance and Sexual Activity   Alcohol use: Yes    Alcohol/week: 6.0 - 10.0 standard drinks of alcohol    Types: 6 - 10 Cans of beer per week    Comment: 2/day, more in the past   Drug use: No   Sexual activity: Yes    Comment: lives with wife and daughter, no dietary restrictions  Other Topics Concern   Not on file  Social History Narrative   Regular exercise:  Walks 1-2 x weekly   Caffeine use:  3-4 daily   4 children- oldest first  marriage 65 (son), 14 son, 44 son, 37 yr old girl   Married   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 [place] Strain: Not on [employer] Insecurity: Not on file  Transportation Needs: Not on file  Physical Activity: Not on file  Stress: Not on file  Social Connections: Not on file     Family History: The patient's family history includes Arthritis in his father, maternal grandmother, mother, and paternal grandmother; COPD in his maternal grandfather; Cancer in his mother and paternal grandmother; Diabetes in his brother, father, maternal grandmother, mother, paternal grandfather, and paternal grandmother; Heart disease (age of onset: 48) in his paternal grandfather; Hyperlipidemia in his brother; Hypertension in his brother, father, and mother; Obesity in his father; Sleep apnea in his father; Stroke in his maternal grandmother. There is no history of Kidney disease.  ROS:   Please see the history of present illness.     All other systems reviewed and are negative.  EKGs/Labs/Other Studies Reviewed:    The following studies were reviewed today:  EKG:  EKG is  ordered today.  The ekg ordered today demonstrates  06/19/22: SR LVH   Recent Labs: 11/03/2021: TSH 0.84 05/09/2022: ALT 20; BUN 10; Creatinine, Ser 1.12; Hemoglobin 12.5; Platelets 247.0; Potassium 4.2; Sodium 139  Recent Lipid Panel    Component Value Date/Time   CHOL 160 11/03/2021 1025   TRIG 110.0 11/03/2021 1025   HDL 39.40 11/03/2021 1025   CHOLHDL 4 11/03/2021 1025   VLDL 22.0 11/03/2021 1025   LDLCALC 99 11/03/2021 1025   LDLCALC 100 (H) 06/09/2020 1613     Risk Assessment/Calculations:    HYPERTENSION CONTROL Vitals:   06/19/22 1605  BP: (!) 140/90    The patient's blood pressure is elevated above target today.  In order to address the patient's elevated BP: A current anti-hypertensive medication was adjusted today.            Physical Exam:    VS:  BP (!) 140/90   Pulse 73   Ht '6\' 3"'$  (1.905 m)   Wt 284 lb (128.8 kg)   SpO2 97%   BMI 35.50 kg/m     Wt Readings from Last 3 Encounters:  06/19/22 284 lb (128.8 kg)  06/19/22 280 lb (127 kg)  06/19/22 281 lb 9.6 oz (127.7 kg)    GEN:  Well nourished, well developed  in no acute distress HEENT: Normal NECK: No JVD; No carotid bruits LYMPHATICS: No lymphadenopathy CARDIAC: RRR, no murmurs, rubs, gallops RESPIRATORY:  Clear to auscultation without rales, wheezing or rhonchi  ABDOMEN: Soft, non-tender, non-distended MUSCULOSKELETAL:  No edema; No deformity  SKIN: Warm and dry NEUROLOGIC:  Alert and oriented x 3 PSYCHIATRIC:  Normal affect   ASSESSMENT:    1. LVH (left ventricular hypertrophy)   2. Primary hypertension   3. Dyslipidemia   4. Aortic ectasia (HCC)   5. Morbid obesity ([place])    PLAN:     Pre-op for lower endoscopy (Dr. Rush Landmark) then surgery (Dr. Dema Severin) Preoperative Risk Assessment - The Revised Cardiac Risk Index = 1;  0.9% very low risk of perioperative myocardial infarction, pulmonary edema, ventricular fibrillation, cardiac arrest, or complete heart block.  - DASI score of 43 associated with 8 functional mets - No further cardiac testing is recommended prior to surgery.  - The patient may proceed to surgery at acceptable risk.     Mild to moderate aortic ectasiia - 45 mm max diameter Double Oblique - will get echo and decide 6 month vs one year screening  LVH with HTN - continue current medications and will get echo - increase coreg dose  CAC HLD with DM Morbid Obesity - saw PCP and has labs pending today - reviewed CT with patient and family - discussed lifestyle changes; at next visit LDL goal < 70     Six months with me    Medication Adjustments/Labs and Tests Ordered: Current medicines are reviewed at length with the patient today.  Concerns regarding medicines are outlined above.  Orders Placed This  Encounter  Procedures   EKG 12-Lead   ECHOCARDIOGRAM COMPLETE   Meds ordered this encounter  Medications   carvedilol (COREG) 25 MG tablet    Sig: Take 1 tablet (25 mg total) by mouth 2 (two) times daily.    Dispense:  180 tablet    Refill:  3    Patient Instructions  Medication Instructions:  Your physician has recommended you make the following change in your medication:  INCREASE: carvedilol (Coreg) to 25 mg by mouth twice daily  *If you need a refill on your cardiac medications before your next appointment, please call your pharmacy*   Lab Work: NONE If you have labs (blood work) drawn today and your tests are completely normal, you will receive your results only by: [place] (if you have MyChart) OR A paper copy in the mail If you have any lab test that is abnormal or we need to change your treatment, we will call you to review the results.   Testing/Procedures: Your physician has requested that you have an echocardiogram. Echocardiography is a painless test that uses sound waves to create images of your heart. It provides your doctor with information about the size and shape of your heart and how well your heart's chambers and valves are working. This procedure takes approximately one hour. There are no restrictions for this procedure. Please do NOT wear cologne, perfume, aftershave, or lotions (deodorant is allowed). Please arrive 15 minutes prior to your appointment time.    Follow-Up: At [facility], you and your health needs are our priority.  As part of our continuing mission to provide you with exceptional heart care, we have created designated Provider Care Teams.  These Care Teams include your primary Cardiologist (physician) and Advanced Practice Providers (APPs -  Physician Assistants and Nurse Practitioners) who all work together  to provide you with the care you need, when you need it.  We recommend signing up for the patient portal called  "MyChart".  Sign up information is provided on this After Visit Summary.  MyChart is used to connect with patients for Virtual Visits (Telemedicine).  Patients are able to view lab/test results, encounter notes, upcoming appointments, etc.  Non-urgent messages can be sent to your provider as well.   To learn more about what you can do with MyChart, go to [email or web address].    Your next appointment:   6 month(s)  The format for your next appointment:   In Person  Provider:   Werner Lean, MD      Important Information About Sugar         Signed, Werner Lean, MD  06/19/2022 4:58 PM    [place]

## 2022-06-19 ENCOUNTER — Institutional Professional Consult (permissible substitution): Payer: BLUE CROSS/BLUE SHIELD | Admitting: Surgical

## 2022-06-19 ENCOUNTER — Ambulatory Visit: Payer: BLUE CROSS/BLUE SHIELD | Admitting: Family Medicine

## 2022-06-19 ENCOUNTER — Encounter: Payer: Self-pay | Admitting: Internal Medicine

## 2022-06-19 ENCOUNTER — Ambulatory Visit: Payer: BLUE CROSS/BLUE SHIELD | Attending: Internal Medicine | Admitting: Internal Medicine

## 2022-06-19 VITALS — BP 156/95 | HR 64 | Resp 20 | Ht 75.0 in | Wt 280.0 lb

## 2022-06-19 VITALS — BP 140/90 | HR 73 | Ht 75.0 in | Wt 284.0 lb

## 2022-06-19 VITALS — BP 132/84 | HR 72 | Temp 98.1°F | Resp 16 | Ht 75.0 in | Wt 281.6 lb

## 2022-06-19 DIAGNOSIS — I2510 Atherosclerotic heart disease of native coronary artery without angina pectoris: Secondary | ICD-10-CM

## 2022-06-19 DIAGNOSIS — E669 Obesity, unspecified: Secondary | ICD-10-CM | POA: Diagnosis not present

## 2022-06-19 DIAGNOSIS — Z1211 Encounter for screening for malignant neoplasm of colon: Secondary | ICD-10-CM | POA: Diagnosis not present

## 2022-06-19 DIAGNOSIS — K921 Melena: Secondary | ICD-10-CM

## 2022-06-19 DIAGNOSIS — I7121 Aneurysm of the ascending aorta, without rupture: Secondary | ICD-10-CM

## 2022-06-19 DIAGNOSIS — E1169 Type 2 diabetes mellitus with other specified complication: Secondary | ICD-10-CM | POA: Diagnosis not present

## 2022-06-19 DIAGNOSIS — C20 Malignant neoplasm of rectum: Secondary | ICD-10-CM

## 2022-06-19 DIAGNOSIS — I77819 Aortic ectasia, unspecified site: Secondary | ICD-10-CM | POA: Diagnosis not present

## 2022-06-19 DIAGNOSIS — I517 Cardiomegaly: Secondary | ICD-10-CM | POA: Diagnosis not present

## 2022-06-19 DIAGNOSIS — E785 Hyperlipidemia, unspecified: Secondary | ICD-10-CM

## 2022-06-19 DIAGNOSIS — D649 Anemia, unspecified: Secondary | ICD-10-CM

## 2022-06-19 DIAGNOSIS — I10 Essential (primary) hypertension: Secondary | ICD-10-CM

## 2022-06-19 LAB — COMPREHENSIVE METABOLIC PANEL WITH GFR
ALT: 23 U/L (ref 0–53)
AST: 24 U/L (ref 0–37)
Albumin: 4.2 g/dL (ref 3.5–5.2)
Alkaline Phosphatase: 67 U/L (ref 39–117)
BUN: 13 mg/dL (ref 6–23)
CO2: 28 meq/L (ref 19–32)
Calcium: 9.2 mg/dL (ref 8.4–10.5)
Chloride: 103 meq/L (ref 96–112)
Creatinine, Ser: 1.07 mg/dL (ref 0.40–1.50)
GFR: 79.33 mL/min
Glucose, Bld: 196 mg/dL — ABNORMAL HIGH (ref 70–99)
Potassium: 4.1 meq/L (ref 3.5–5.1)
Sodium: 137 meq/L (ref 135–145)
Total Bilirubin: 0.5 mg/dL (ref 0.2–1.2)
Total Protein: 6.5 g/dL (ref 6.0–8.3)

## 2022-06-19 LAB — CBC
HCT: 39.2 % (ref 39.0–52.0)
Hemoglobin: 12.5 g/dL — ABNORMAL LOW (ref 13.0–17.0)
MCHC: 32 g/dL (ref 30.0–36.0)
MCV: 80.3 fl (ref 78.0–100.0)
Platelets: 220 K/uL (ref 150.0–400.0)
RBC: 4.89 Mil/uL (ref 4.22–5.81)
RDW: 15.6 % — ABNORMAL HIGH (ref 11.5–15.5)
WBC: 7.1 K/uL (ref 4.0–10.5)

## 2022-06-19 LAB — LIPID PANEL
Cholesterol: 149 mg/dL (ref 0–200)
HDL: 37.9 mg/dL — ABNORMAL LOW
LDL Cholesterol: 83 mg/dL (ref 0–99)
NonHDL: 111.3
Total CHOL/HDL Ratio: 4
Triglycerides: 143 mg/dL (ref 0.0–149.0)
VLDL: 28.6 mg/dL (ref 0.0–40.0)

## 2022-06-19 LAB — TSH: TSH: 0.69 u[IU]/mL (ref 0.35–5.50)

## 2022-06-19 LAB — HEMOGLOBIN A1C: Hgb A1c MFr Bld: 8.1 % — ABNORMAL HIGH (ref 4.6–6.5)

## 2022-06-19 MED ORDER — CARVEDILOL 25 MG PO TABS: 25.0000 mg | ORAL_TABLET | Freq: Two times a day (BID) | ORAL | 3 refills | Status: DC

## 2022-06-19 NOTE — Progress Notes (Signed)
Subjective:     Patient ID: Robert [name], male    DOB: 1970-09-16, 51 y.o.   MRN: [id number]  Chief Complaint  Patient presents with   Thoracic Aortic Aneurysm    Surgical consult,    HPI Patient is in today for the patient is a 51 year old male seen in the office on today's date for 4.3 cm ascending thoracic aortic aneurysm noted on recent CT scan which has been obtained during his evaluation for recently diagnosed colorectal cancer.  He does describe occasional shortness of breath but no chest pain per se.  He is on 3 medications for blood pressure but a significant increase in his stress level as related to his recent diagnosis.  He knows the importance of blood pressure control and is agreeable to keep a diary.  He is scheduled to see cardiology today.  Findings on the CT scan included coronary calcifications in the LAD and circumflex arteries.   Past Medical History:  Diagnosis Date   Anemia    Arthralgia 01/11/2013   Diffuse, b/l Hips R>L Knees, ankles, low back   Arthritis    Back pain 12/08/2015   Cancer ([facility])    Diabetes mellitus type 2 in obese ([facility]) 11/09/2012   Dyslipidemia 11/09/2012   Esophageal reflux 12/08/2015   History of migraine headaches    Hyperglycemia 11/09/2012   Hypertension    Patient Active Problem List   Diagnosis Date Noted   Rectal cancer ([facility]) 05/23/2022   Plantar wart of left foot 12/05/2020   Blood in stool 12/05/2020   Feeling grief 06/09/2020   Neck pain 06/09/2020   Bilateral hand pain 06/09/2020   Rectal bleeding 01/23/2018   Ingrown toenail of both feet 06/16/2016   Esophageal reflux 12/08/2015   Back pain 12/08/2015   Obesity 01/27/2014   Arthralgia 01/11/2013   Dyslipidemia 11/09/2012   Diabetes mellitus type 2 in obese ([facility]) 11/09/2012   Colon cancer screening 05/31/2012   HTN (hypertension) 04/25/2012    Current Outpatient Medications  Medication Instructions   amLODipine (NORVASC) 5 MG tablet Oral, Daily    carvedilol (COREG) 12.5 mg, Oral, 2 times daily with meals   esomeprazole (NEXIUM) 20 mg, Oral, Daily   glucose blood (COOL BLOOD GLUCOSE TEST STRIPS) test strip Check blood sugar once daily   Iron (Ferrous Sulfate) 325 mg, Oral, 2 times daily with meals   losartan (COZAAR) 100 MG tablet TAKE 1 TABLET BY MOUTH EVERY DAY   metFORMIN (GLUCOPHAGE) 500 mg, Oral, Daily with breakfast   tiZANidine (ZANAFLEX) 1-4 mg, Oral, 2 times daily PRN    Review of Systems  Constitutional:  Positive for malaise/fatigue.  Gastrointestinal:  Positive for blood in stool and heartburn.  Musculoskeletal:  Positive for joint pain.  Neurological:  Positive for tingling.   Social History   Occupational History   Occupation: Software engineer: [employer]  Tobacco Use   Smoking status: Former    Packs/day: 1.50    Years: 10.00    Total pack years: 15.00    Types: Cigarettes    Quit date: 08/14/1995    Years since quitting: 26.8   Smokeless tobacco: Never  Vaping Use   Vaping Use: Never used  Substance and Sexual Activity   Alcohol use: Yes    Alcohol/week: 6.0 - 10.0 standard drinks of alcohol    Types: 6 - 10 Cans of beer per week    Comment: 2/day, more in the past   Drug use: No  Sexual activity: Yes    Comment: lives with wife and daughter, no dietary restrictions     Narrative & Impression  CLINICAL DATA:  Colon cancer staging; * Tracking Code: BO *   EXAM: CT CHEST, ABDOMEN, AND PELVIS WITH CONTRAST   TECHNIQUE: Multidetector CT imaging of the chest, abdomen and pelvis was performed following the standard protocol during bolus administration of intravenous contrast.   RADIATION DOSE REDUCTION: This exam was performed according to the departmental dose-optimization program which includes automated exposure control, adjustment of the mA and/or kV according to patient size and/or use of iterative reconstruction technique.   CONTRAST:  171m OMNIPAQUE IOHEXOL 300 MG/ML   SOLN   COMPARISON:  None Available.   FINDINGS: CT CHEST FINDINGS   Cardiovascular: Normal heart size. No pericardial effusion. 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 Mild atherosclerotic disease of the thoracic aorta. Severe coronary artery calcifications of the LAD and circumflex.   Mediastinum/Nodes: Esophagus and thyroid are unremarkable. No pathologically enlarged lymph nodes seen in the chest.   Lungs/Pleura: Central airways are patent. No consolidation, pleural effusion or pneumothorax. Calcified nodule of the right middle lobe. No suspicious pulmonary nodules.   Musculoskeletal: No chest wall mass or suspicious bone lesions identified.   CT ABDOMEN PELVIS FINDINGS   Hepatobiliary: No focal liver abnormality is seen. No gallstones, gallbladder wall thickening, or biliary dilatation.   Pancreas: Unremarkable. No pancreatic ductal dilatation or surrounding inflammatory changes.   Spleen: Normal in size without focal abnormality.   Adrenals/Urinary Tract: Adrenal glands are unremarkable. Kidneys are normal, without renal calculi, focal lesion, or hydronephrosis. Bladder is unremarkable.   Stomach/Bowel: Stomach is within normal limits. Appendix appears normal. No evidence of bowel wall thickening, distention, or inflammatory changes.   Vascular/Lymphatic: Aortic atherosclerosis. No enlarged abdominal or pelvic lymph nodes. Mildly enlarged bilateral inguinal lymph nodes. Reference left inguinal lymph node measuring 1.1 cm in short axis on series 2, image 124.   Reproductive: Prostate is unremarkable.   Other: No abdominal wall hernia or abnormality. No abdominopelvic ascites.   Musculoskeletal: No acute or significant osseous findings.   IMPRESSION: 1. Known primary malignancy is not well visualized. Correlate with recent colonoscopy. 2. No evidence of metastatic disease in the chest, abdomen or pelvis. 3. Mildly enlarged bilateral  inguinal lymph nodes, likely reactive. 4. 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 Recommend annual imaging followup by CTA or MRA. This recommendation follows 2010 ACCF/AHA/AATS/ACR/ASA/SCA/SCAI/SIR/STS/SVM Guidelines for the Diagnosis and Management of Patients with Thoracic Aortic Disease. Circulation. 2010; 121:: Z610-R604 Aortic aneurysm NOS (ICD10-I71.9) 5. Severe coronary artery calcifications of the LAD and circumflex. 6. Aortic Atherosclerosis (ICD10-I70.0).     Electronically Signed   By: LYetta GlassmanM.D.   On: 05/18/2022 09:46      Objective:    Ht '6\' 3"'$  (1.905 m)   BMI 35.20 kg/m  BP Readings from Last 3 Encounters:  06/19/22 (!) 156/95  06/19/22 138/80  05/23/22 (!) 149/84   Wt Readings from Last 3 Encounters:  06/19/22 280 lb (127 kg)  06/19/22 281 lb 9.6 oz (127.7 kg)  05/23/22 284 lb (128.8 kg)      Physical Exam Constitutional:      Appearance: Normal appearance. He is obese. He is not ill-appearing.  HENT:     Head: Normocephalic and atraumatic.  Eyes:     General: No scleral icterus. Cardiovascular:     Rate and Rhythm: Normal rate and regular rhythm.     Pulses:  Normal pulses.     Heart sounds: No murmur heard.    Friction rub present. No gallop.  Pulmonary:     Effort: Pulmonary effort is normal.     Breath sounds: Normal breath sounds.  Abdominal:     General: There is no distension.     Palpations: Abdomen is soft.  Musculoskeletal:     Comments: Minor lower extremity edema and early venous stasis changes  Skin:    Capillary Refill: Capillary refill takes less than 2 seconds.     Coloration: Skin is not jaundiced or pale.  Neurological:     Mental Status: He is alert and oriented to person, place, and time.  Psychiatric:        Mood and Affect: Mood normal.        Behavior: Behavior normal.        Thought Content: Thought content normal.        Judgment: Judgment normal.     No results found for any visits  on 06/19/22.      Assessment & Plan:   4.3 cm ascending thoracic aortic aneurysm.  We discussed the importance of blood pressure control and encouraged him to get a home monitor and keeping a close diary.  It is noted his blood pressure is elevated today but he is stressed from recent diagnosis and multiple doctor appointments.  We also discussed lifestyle and nutrition improvements that can be made.  He is to continue his current medication regimen.  He is seeing cardiology later today.  Is in the planning stages in terms of cancer treatment.  From our perspective we can repeat the chest CTA in 1 year.     Problem List Items Addressed This Visit   None Visit Diagnoses     Aneurysm of ascending aorta without rupture ([place])    -  Primary       No orders of the defined types were placed in this encounter.   No follow-ups on file.  John Giovanni, PA-C

## 2022-06-19 NOTE — Patient Instructions (Signed)
Activity and lifestyle management as discussed Blood pressure monitoring as discussed

## 2022-06-19 NOTE — Patient Instructions (Signed)
Mediterranean Diet or MIND diet,   Hydrate 60-80 ounces of water daily  Regular exercise 4000 steps a minimum daily over 8000 steps daily is better.   Colorectal Cancer  Colorectal cancer is a cancerous (malignant) tumor in the colon or rectum, which are parts of the large intestine. A tumor is a mass of cells or tissue. The cancer can spread (metastasize) to other parts of the body. What are the causes? This condition is usually caused by abnormal growths called polyps on the inner wall of the colon or rectum. Left untreated, these polyps can develop into cancer. Other times, abnormal changes to genes (gene mutations) can cause cells to become cancerous. What increases the risk? The following factors may make you more likely to develop this condition: Being older than age 24. Having a personal or family history of colorectal cancer or polyps in your colon. Having diabetes, or having had cancer and cancer treatments such as radiation before. Having certain hereditary conditions, such as: Lynch syndrome. Familial adenomatous polyposis. Turcot syndrome. Peutz-Jeghers syndrome. MUTYH-associated polyposis (MAP). Being overweight or obese. Having a diet that is: High in red meats, such as beef, pork, lamb, or liver. High in precooked, cured, or other processed meat, such as sausages, meat loaves, and hot dogs. Low in fiber, such as fiber found in whole grains, fruits, and vegetables. Being inactive (sedentary), smoking, or drinking too much alcohol. Having an inflammatory bowel disease, such as ulcerative colitis or Crohn's disease. What are the signs or symptoms? Early colorectal cancer often does not cause symptoms. As the cancer grows, symptoms may include: Changes in bowel habits. Feeling like the bowel does not empty completely after a bowel movement. Stools (feces) that are narrower than usual, or blood in the stool or toilet after a bowel movement. The blood may be bright red or  very dark in color. Diarrhea, constipation, or frequent gas pain. Anemia, constant tiredness (fatigue), or nausea and vomiting. Discomfort, pain, bloating, fullness, or cramps in the abdomen. Unexplained weight loss. How is this diagnosed? This condition may be diagnosed with: A medical history. A physical exam. Tests. These may include: An exam of the rectum using a gloved finger (digital rectal exam). A stool test called a fecal occult blood test. Blood tests. A biopsy. This is removal of a tissue sample from the colon or rectum to be looked at under a microscope. You may also have other tests, including: X-rays, CT scans, MRIs, or a PET scan. A sigmoidoscopy. This test is done to view the inside of the rectum. A colonoscopy. This test is done to view the inside of the colon. During this test, small polyps can be removed or biopsies may be taken. An endorectal ultrasound. This test checks how deep a tumor in the rectum has grown and whether the cancer has spread to lymph nodes or other nearby tissues. Additional tests may be done to find out whether the cancer has spread to other parts of the body (what stage it is). The stages of cancer include: Stage 0 - At this stage, the cancer is found only in the innermost lining of the colon or rectum. The tumor has not spread to other tissue. Stage 1 (I) - At this stage, the cancer has grown into the inner wall (muscle layer) of the colon or rectum. Stage 2 (II) - At this stage, the cancer has grown more deeply into the wall of the colon or rectum or through the wall. It may have invaded nearby tissue  or organs. Stage 3 (III) - At this stage, the cancer has spread to nearby lymph nodes or tissue near the lymph nodes. Stage 4 (IV) - At this stage, the cancer has spread to other parts of the body that are not near the colon, such as the liver or lungs. How is this treated? Treatment for this condition depends on the type and stage of the cancer.  Treatment may include: Surgery. In the early stages of the cancer, surgery may be done to remove polyps or small tumors from the colon. In later stages, surgery may be done to remove part of the colon (partial colectomy). Chemotherapy. This treatment uses medicines to kill cancer cells. Targeted therapy. This treatment can kill tumor cells by targeting specific gene mutations or proteins that the cancer expresses. Immunotherapy (biologic therapy). This treatment uses your body's disease-fighting system (immune system) to fight the cancer. Substances made by your body or in a laboratory are used to boost, direct, or restore your body's natural defenses against cancer. Radiation therapy. This treatment uses radiation to kill cancer cells or shrink tumors. Radiofrequency ablation. This treatment uses radio waves to destroy the tumors that may have spread to other areas of the body, such as the liver. Follow these instructions at home: Take over-the-counter and prescription medicines only as told by your health care provider. Try to eat regular, healthy meals. Some of your treatments might affect your appetite. Ask to meet with a dietitian if you are having problems eating or with your appetite. Consider joining a support group. This may help you learn about your diagnosis and manage the stress of having colorectal cancer. If you are admitted to the hospital, tell your cancer care team. Keep all follow-up visits. This is important. How is this prevented? Colorectal cancer can be prevented with screening tests that find polyps so they can be removed before they develop into cancer. All adults should have screening for colorectal cancer starting at age 34 and continuing until age 32. Your health care provider may recommend screening before age 54. People at increased risk should start screening at an earlier age. You may be able to help reduce your risk of developing colorectal cancer by staying at a  healthy weight, eating a healthy diet, avoiding tobacco and alcohol use, and being physically active. Where to find more information American Cancer Society: cancer.[place] ([place]): cancer.gov Contact a health care provider if: Your diarrhea or constipation does not go away. You have blood in your stool or in the toilet after a bowel movement. Your bowel habits change. You have increased pain in your abdomen. You notice new fatigue or weakness. You lose weight without a known reason. Get help right away if: You have increased bleeding from the rectum. You have any uncontrollable or severe abdominal symptoms. Summary Colorectal cancer is a cancerous (malignant) tumor in the colon or rectum, which are parts of the large intestine. Common risk factors for this condition include being older than age 76, having a personal or family history of colorectal cancer or colon polyps, having certain hereditary conditions, or having conditions such as diabetes or inflammatory bowel disease. This condition may be diagnosed with tests, such as a colonoscopy and biopsy. Treatment depends on the type and stage of the cancer. Often, treatment includes surgery to remove the abnormal tissue, along with chemotherapy, targeted therapy, or immunotherapy. Keep all follow-up visits. This is important. This information is not intended to replace advice given to you by your  health care provider. Make sure you discuss any questions you have with your health care provider. Document Revised: 11/18/2019 Document Reviewed: 11/18/2019 Elsevier Patient Education  [place].

## 2022-06-19 NOTE — Progress Notes (Signed)
Subjective:   By signing my name below, I, [name], attest that this documentation has been prepared under the direction and in the presence of [name], MD., 06/19/2022.   Patient ID: Robert [name], male    DOB: 1971-05-28, 51 y.o.   MRN: [id number]  No chief complaint on file.  HPI Patient is in today for an office visit.  Cardiology: He is diagnosed with coronary artery disease. He takes Ferrous Sulfate 325 mg but states that this causes him to feel nauseas. He further states that he feels fatigue throughout the day. His blood pressure was rechecked during the visit and was 132/74. He expresses some concern about taking medications to manage his cholesterol.  Rectal Cancer: Patient has recently been diagnosed with rectal cancer. He underwent a colonoscopy on 05/09/2022 which revealed 5 sessile polyps in the rectum, transverse colon, and hepatic flexure as well as a fungating nonobstructing medium-sized mass in the proximal rectum 10-13 cm from the anal verge. He further underwent an MRI of the pelvis and rectum on 05/29/2022 which assumed a pathological diagnosis of rectal adenocarcinoma in T stage T1/T2. He is currently seeing his gastroenterologist, Dr. Rush Landmark, and has a lower endoscopic ultrasound on 06/21/2022. He is also seeing his oncologist, Dr. Burr Medico, who has prescribed Xelox.  Past Medical History:  Diagnosis Date   Anemia    Arthralgia 01/11/2013   Diffuse, b/l Hips R>L Knees, ankles, low back   Arthritis    Back pain 12/08/2015   Cancer ([place])    Diabetes mellitus type 2 in obese ([place]) 11/09/2012   Dyslipidemia 11/09/2012   Esophageal reflux 12/08/2015   History of migraine headaches    Hyperglycemia 11/09/2012   Hypertension    Past Surgical History:  Procedure Laterality Date   NO PAST SURGERIES     Family History  Problem Relation Age of Onset   Arthritis Mother    Hypertension Mother    Diabetes Mother    Cancer Mother    Arthritis Father     Hypertension Father    Diabetes Father    Sleep apnea Father    Obesity Father    Arthritis Maternal Grandmother    Diabetes Maternal Grandmother    Stroke Maternal Grandmother    Arthritis Paternal Grandmother    Diabetes Paternal Grandmother    Cancer Paternal Grandmother        lung, smoker   Diabetes Paternal Grandfather    Heart disease Paternal Grandfather 66       MI   Hyperlipidemia Brother    Hypertension Brother    Diabetes Brother    COPD Maternal Grandfather    Kidney disease Neg Hx    Social History   Socioeconomic History   Marital status: Married    Spouse name: Not on file   Number of children: 4   Years of education: Not on file   Highest education level: Not on file  Occupational History   Occupation: Training and development officer    Employer: [employer]  Tobacco Use   Smoking status: Former    Packs/day: 1.50    Years: 10.00    Total pack years: 15.00    Types: Cigarettes    Quit date: 08/14/1995    Years since quitting: 26.8   Smokeless tobacco: Never  Vaping Use   Vaping Use: Never used  Substance and Sexual Activity   Alcohol use: Yes    Alcohol/week: 6.0 - 10.0 standard drinks of alcohol    Types: 6 -  10 Cans of beer per week    Comment: 2/day, more in the past   Drug use: No   Sexual activity: Yes    Comment: lives with wife and daughter, no dietary restrictions  Other Topics Concern   Not on file  Social History Narrative   Regular exercise:  Walks 1-2 x weekly   Caffeine use:  3-4 daily   4 children- oldest first marriage 21 (son), 48 son, 44 son, 102 yr old girl   Married   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 90 tablet 1   carvedilol (COREG) 12.5 MG tablet Take 1 tablet (12.5 mg total) by mouth 2 (two) times daily with a meal. 180 tablet 0   esomeprazole (NEXIUM) 20 MG capsule Take 20 mg by mouth daily.     glucose blood (COOL BLOOD GLUCOSE TEST STRIPS) test strip Check blood sugar once daily 100 each 12   Iron, Ferrous Sulfate, 325 (65 Fe) MG TABS Take 325 mg by mouth 2 (two) times daily with a meal. 30 tablet 2   losartan (COZAAR) 100 MG tablet TAKE 1 TABLET BY MOUTH EVERY DAY 90 tablet 1   metFORMIN (GLUCOPHAGE) 500 MG tablet Take 1 tablet (500 mg total) by mouth daily with breakfast. 90 tablet 0   tiZANidine (ZANAFLEX) 2 MG tablet Take 0.5-2 tablets (1-4 mg total) by mouth 2 (two) times daily as needed for muscle spasms. 30 tablet 5   Facility-Administered Medications Prior to Visit  Medication Dose Route Frequency Provider Last Rate Last Admin   0.9 %  sodium chloride infusion  500 mL Intravenous Once Ladene Artist, MD       Allergies  Allergen Reactions   Mucinex [Guaifenesin Er] Shortness Of Breath, Itching and Dermatitis    Swelling of hands and feet   Nsaids     Aleve, Advil cause swelling, itching and rash   Latex Rash   ROS    Objective:    Physical Exam Constitutional:      General: He is not in acute distress.    Appearance: Normal appearance. He is not ill-appearing.  HENT:     Head: Normocephalic and atraumatic.     Right Ear: External ear normal.     Left Ear: External ear normal.     Mouth/Throat:     Mouth: Mucous membranes are moist.     Pharynx: Oropharynx is clear.  Eyes:     Extraocular Movements: Extraocular movements intact.     Pupils: Pupils are equal, round, and reactive to light.  Cardiovascular:     Rate and Rhythm: Normal rate and regular rhythm.     Pulses: Normal pulses.     Heart sounds: Normal heart sounds. No murmur heard.    No  gallop.  Pulmonary:     Effort: Pulmonary effort is normal. No respiratory distress.     Breath sounds: Normal breath sounds. No wheezing or rales.  Abdominal:  General: Bowel sounds are normal.  Skin:    General: Skin is warm and dry.  Neurological:     Mental Status: He is alert and oriented to person, place, and time.  Psychiatric:        Mood and Affect: Mood normal.        Behavior: Behavior normal.        Judgment: Judgment normal.    There were no vitals taken for this visit. Wt Readings from Last 3 Encounters:  05/23/22 284 lb (128.8 kg)  05/09/22 280 lb 9.6 oz (127.3 kg)  04/06/22 281 lb 9.6 oz (127.7 kg)   Diabetic Foot Exam - Simple   No data filed    Lab Results  Component Value Date   WBC 6.8 05/09/2022   HGB 12.5 (L) 05/09/2022   HCT 38.0 (L) 05/09/2022   PLT 247.0 05/09/2022   GLUCOSE 122 (H) 05/09/2022   CHOL 160 11/03/2021   TRIG 110.0 11/03/2021   HDL 39.40 11/03/2021   LDLCALC 99 11/03/2021   ALT 20 05/09/2022   AST 17 05/09/2022   NA 139 05/09/2022   K 4.2 05/09/2022   CL 104 05/09/2022   CREATININE 1.12 05/09/2022   BUN 10 05/09/2022   CO2 29 05/09/2022   TSH 0.84 11/03/2021   PSA 0.35 11/03/2021   HGBA1C 7.2 (H) 11/03/2021   MICROALBUR 0.8 11/03/2021   Lab Results  Component Value Date   TSH 0.84 11/03/2021   Lab Results  Component Value Date   WBC 6.8 05/09/2022   HGB 12.5 (L) 05/09/2022   HCT 38.0 (L) 05/09/2022   MCV 80.6 05/09/2022   PLT 247.0 05/09/2022   Lab Results  Component Value Date   NA 139 05/09/2022   K 4.2 05/09/2022   CO2 29 05/09/2022   GLUCOSE 122 (H) 05/09/2022   BUN 10 05/09/2022   CREATININE 1.12 05/09/2022   BILITOT 0.7 05/09/2022   ALKPHOS 66 05/09/2022   AST 17 05/09/2022   ALT 20 05/09/2022   PROT 6.7 05/09/2022   ALBUMIN 4.1 05/09/2022   CALCIUM 9.1 05/09/2022   GFR 75.16 05/09/2022   Lab Results  Component Value Date   CHOL 160 11/03/2021   Lab Results  Component Value Date   HDL  39.40 11/03/2021   Lab Results  Component Value Date   LDLCALC 99 11/03/2021   Lab Results  Component Value Date   TRIG 110.0 11/03/2021   Lab Results  Component Value Date   CHOLHDL 4 11/03/2021   Lab Results  Component Value Date   HGBA1C 7.2 (H) 11/03/2021      Assessment & Plan:   Problem List Items Addressed This Visit   None  No orders of the defined types were placed in this encounter.  I, [name], personally preformed the services described in this documentation.  All medical record entries made by the scribe were at my direction and in my presence.  I have reviewed the chart and discharge instructions (if applicable) and agree that the record reflects my personal performance and is accurate and complete. 06/19/2022  I,Mohammed Iqbal,acting as a scribe for Penni Homans, MD.,have documented all relevant documentation on the behalf of Penni Homans, MD,as directed by  Penni Homans, MD while in the presence of Penni Homans, MD.  [name]

## 2022-06-19 NOTE — Patient Instructions (Signed)
Medication Instructions:  Your physician has recommended you make the following change in your medication:  INCREASE: carvedilol (Coreg) to 25 mg by mouth twice daily  *If you need a refill on your cardiac medications before your next appointment, please call your pharmacy*   Lab Work: NONE If you have labs (blood work) drawn today and your tests are completely normal, you will receive your results only by: [place] (if you have MyChart) OR A paper copy in the mail If you have any lab test that is abnormal or we need to change your treatment, we will call you to review the results.   Testing/Procedures: Your physician has requested that you have an echocardiogram. Echocardiography is a painless test that uses sound waves to create images of your heart. It provides your doctor with information about the size and shape of your heart and how well your heart's chambers and valves are working. This procedure takes approximately one hour. There are no restrictions for this procedure. Please do NOT wear cologne, perfume, aftershave, or lotions (deodorant is allowed). Please arrive 15 minutes prior to your appointment time.    Follow-Up: At [facility], you and your health needs are our priority.  As part of our continuing mission to provide you with exceptional heart care, we have created designated Provider Care Teams.  These Care Teams include your primary Cardiologist (physician) and Advanced Practice Providers (APPs -  Physician Assistants and Nurse Practitioners) who all work together to provide you with the care you need, when you need it.  We recommend signing up for the patient portal called "MyChart".  Sign up information is provided on this After Visit Summary.  MyChart is used to connect with patients for Virtual Visits (Telemedicine).  Patients are able to view lab/test results, encounter notes, upcoming appointments, etc.  Non-urgent messages can be sent to your  provider as well.   To learn more about what you can do with MyChart, go to [email or web address].    Your next appointment:   6 month(s)  The format for your next appointment:   In Person  Provider:   Werner Lean, MD      Important Information About Sugar

## 2022-06-20 DIAGNOSIS — I2510 Atherosclerotic heart disease of native coronary artery without angina pectoris: Secondary | ICD-10-CM | POA: Insufficient documentation

## 2022-06-20 NOTE — Assessment & Plan Note (Signed)
Diagnosed on recent imaging for his cancer staging. Has appt today for cardiac evaluation and clearance.

## 2022-06-20 NOTE — Assessment & Plan Note (Addendum)
Sugars have trended up will increase Metformin to 1000 mg po bid and reassess. minimize simple carbs. Increase exercise as tolerated. Patient has notified us he is not interested in increasing his metformin. He is asked to consider adding Actos 15 mg daily

## 2022-06-20 NOTE — Progress Notes (Signed)
Subjective:   By signing my name below, I, Robert [name], attest that this documentation has been prepared under the direction and in the presence of Robert Lukes, MD., 06/19/2022.   Patient ID: Robert [name], male    DOB: 01/27/1971, 51 y.o.   MRN: [id number]  Chief Complaint  Patient presents with   Follow-up    Follow up   HPI Patient is in today for an office visit.  Cardiology: He is diagnosed with coronary artery disease. He takes Ferrous Sulfate 325 mg but states that this causes him to feel nauseas. He further states that he feels fatigue throughout the day. His blood pressure was rechecked during the visit and was 132/74. He expresses some concern about taking medications to manage his cholesterol.  Rectal Cancer: Patient has recently been diagnosed with rectal cancer. He underwent a colonoscopy on 05/09/2022 which revealed 5 sessile polyps in the rectum, transverse colon, and hepatic flexure as well as a fungating nonobstructing medium-sized mass in the proximal rectum 10-13 cm from the anal verge. He further underwent an MRI of the pelvis and rectum on 05/29/2022 which assumed a pathological diagnosis of rectal adenocarcinoma in T stage T1/T2. He is currently seeing his gastroenterologist, Dr. Rush [name], and has a lower endoscopic ultrasound on 06/21/2022. He is also seeing his oncologist, Dr. Burr [name], who has prescribed Xelox.  Past Medical History:  Diagnosis Date   Anemia    Arthralgia 01/11/2013   Diffuse, b/l Hips R>L Knees, ankles, low back   Arthritis    Back pain 12/08/2015   Cancer ([place])    Diabetes mellitus type 2 in obese ([place]) 11/09/2012   Dyslipidemia 11/09/2012   Esophageal reflux 12/08/2015   History of migraine headaches    Hyperglycemia 11/09/2012   Hypertension    Past Surgical History:  Procedure Laterality Date   NO PAST SURGERIES     Family History  Problem Relation Age of Onset   Arthritis Mother    Hypertension Mother    Diabetes Mother     Cancer Mother    Arthritis Father    Hypertension Father    Diabetes Father    Sleep apnea Father    Obesity Father    Arthritis Maternal Grandmother    Diabetes Maternal Grandmother    Stroke Maternal Grandmother    Arthritis Paternal Grandmother    Diabetes Paternal Grandmother    Cancer Paternal Grandmother        lung, smoker   Diabetes Paternal Grandfather    Heart disease Paternal Grandfather 76       MI   Hyperlipidemia Brother    Hypertension Brother    Diabetes Brother    COPD Maternal Grandfather    Kidney disease Neg Hx    Social History   Socioeconomic History   Marital status: Married    Spouse name: Not on file   Number of children: 4   Years of education: Not on file   Highest education level: Not on file  Occupational History   Occupation: Training and development officer    Employer: [employer]  Tobacco Use   Smoking status: Former    Packs/day: 1.50    Years: 10.00    Total pack years: 15.00    Types: Cigarettes    Quit date: 08/14/1995    Years since quitting: 26.8   Smokeless tobacco: Never  Vaping Use   Vaping Use: Never used  Substance and Sexual Activity   Alcohol use: Yes    Alcohol/week: 6.0 - 10.0  standard drinks of alcohol    Types: 6 - 10 Cans of beer per week    Comment: 2/day, more in the past   Drug use: No   Sexual activity: Yes    Comment: lives with wife and daughter, no dietary restrictions  Other Topics Concern   Not on file  Social History Narrative   Regular exercise:  Walks 1-2 x weekly   Caffeine use:  3-4 daily   4 children- oldest first marriage 30 (son), 47 son, 35 son, 53 yr old girl   Married   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 90 tablet 1   carvedilol (COREG) 25 MG tablet Take 1 tablet (25 mg total) by mouth 2 (two) times daily. 180 tablet 3   esomeprazole (NEXIUM) 20 MG capsule Take 20 mg by mouth daily.     glucose blood (COOL BLOOD GLUCOSE TEST STRIPS) test strip Check blood sugar once daily 100 each 12   Iron, Ferrous Sulfate, 325 (65 Fe) MG TABS Take 325 mg by mouth 2 (two) times daily with a meal. 30 tablet 2   losartan (COZAAR) 100 MG tablet TAKE 1 TABLET BY MOUTH EVERY DAY 90 tablet 1   metFORMIN (GLUCOPHAGE) 500 MG tablet Take 1 tablet (500 mg total) by mouth daily with breakfast. 90 tablet 0   tiZANidine (ZANAFLEX) 2 MG tablet Take 0.5-2 tablets (1-4 mg total) by mouth 2 (two) times daily as needed for muscle spasms. 30 tablet 5   carvedilol (COREG) 12.5 MG tablet Take 1 tablet (12.5 mg total) by mouth 2 (two) times daily with a meal. 180 tablet 0   Facility-Administered Medications Prior to Visit  Medication Dose Route Frequency Provider Last Rate Last Admin   0.9 %  sodium chloride infusion  500 mL Intravenous Once Ladene Artist, MD       Allergies  Allergen Reactions   Mucinex [Guaifenesin Er] Shortness Of Breath, Itching and Dermatitis    Swelling of hands and feet   Nsaids     Aleve, Advil cause swelling, itching and rash   Latex Rash   ROS    Objective:    Physical Exam Constitutional:      General: He is not in acute distress.    Appearance: Normal appearance. He is not ill-appearing.  HENT:     Head: Normocephalic and atraumatic.     Right Ear: External ear normal.     Left Ear: External ear normal.     Mouth/Throat:     Mouth: Mucous membranes are moist.     Pharynx: Oropharynx is clear.  Eyes:     Extraocular Movements: Extraocular movements intact.     Pupils: Pupils are equal, round, and reactive to light.  Cardiovascular:      Rate and Rhythm: Normal rate and regular rhythm.     Pulses: Normal pulses.     Heart sounds: Normal heart sounds. No murmur heard.    No  gallop.  Pulmonary:     Effort: Pulmonary effort is normal. No respiratory distress.     Breath sounds: Normal breath sounds. No wheezing or rales.  Abdominal:     General: Bowel sounds are normal.  Skin:    General: Skin is warm and dry.  Neurological:     Mental Status: He is alert and oriented to person, place, and time.  Psychiatric:        Mood and Affect: Mood normal.        Behavior: Behavior normal.        Judgment: Judgment normal.    BP 132/84   Pulse 72   Temp 98.1 F (36.7 C) (Oral)   Resp 16   Ht '6\' 3"'$  (1.905 m)   Wt 281 lb 9.6 oz (127.7 kg)   SpO2 95%   BMI 35.20 kg/m  Wt Readings from Last 3 Encounters:  06/19/22 284 lb (128.8 kg)  06/19/22 280 lb (127 kg)  06/19/22 281 lb 9.6 oz (127.7 kg)   Diabetic Foot Exam - Simple   No data filed    Lab Results  Component Value Date   WBC 7.1 06/19/2022   HGB 12.5 (L) 06/19/2022   HCT 39.2 06/19/2022   PLT 220.0 06/19/2022   GLUCOSE 196 (H) 06/19/2022   CHOL 149 06/19/2022   TRIG 143.0 06/19/2022   HDL 37.90 (L) 06/19/2022   LDLCALC 83 06/19/2022   ALT 23 06/19/2022   AST 24 06/19/2022   NA 137 06/19/2022   K 4.1 06/19/2022   CL 103 06/19/2022   CREATININE 1.07 06/19/2022   BUN 13 06/19/2022   CO2 28 06/19/2022   TSH 0.69 06/19/2022   PSA 0.35 11/03/2021   HGBA1C 8.1 (H) 06/19/2022   MICROALBUR 0.8 11/03/2021   Lab Results  Component Value Date   TSH 0.69 06/19/2022   Lab Results  Component Value Date   WBC 7.1 06/19/2022   HGB 12.5 (L) 06/19/2022   HCT 39.2 06/19/2022   MCV 80.3 06/19/2022   PLT 220.0 06/19/2022   Lab Results  Component Value Date   NA 137 06/19/2022   K 4.1 06/19/2022   CO2 28 06/19/2022   GLUCOSE 196 (H) 06/19/2022   BUN 13 06/19/2022   CREATININE 1.07 06/19/2022   BILITOT 0.5 06/19/2022   ALKPHOS 67 06/19/2022   AST 24  06/19/2022   ALT 23 06/19/2022   PROT 6.5 06/19/2022   ALBUMIN 4.2 06/19/2022   CALCIUM 9.2 06/19/2022   GFR 79.33 06/19/2022   Lab Results  Component Value Date   CHOL 149 06/19/2022   Lab Results  Component Value Date   HDL 37.90 (L) 06/19/2022   Lab Results  Component Value Date   LDLCALC 83 06/19/2022   Lab Results  Component Value Date   TRIG 143.0 06/19/2022   Lab Results  Component Value Date   CHOLHDL 4 06/19/2022   Lab Results  Component Value Date   HGBA1C 8.1 (H) 06/19/2022      Assessment & Plan:   Problem List Items Addressed This Visit     HTN (hypertension)    Well controlled, no changes to meds. Encouraged heart healthy diet such as the DASH diet and exercise as tolerated.        Relevant Orders   CBC (Completed)   Comprehensive metabolic panel (Completed)   TSH (Completed)   Colon cancer screening - Primary   Hyperlipidemia LDL goal <70    Cardiology has recommended a LDL  goal of <70 and patient is agreeable but they have recommended we institute therapy after his cancer surgery so we will hold off on adding Atorvastatin 10 mg tabs, 1 daily until after surgery. Maintain heart healthy diet such as Mediterranean, MIND or DASH diet, increase exercise, avoid trans fats, simple carbohydrates and processed foods, consider a krill or fish or flaxseed oil cap daily.        Diabetes mellitus type 2 in obese ([place])    Sugars have trended up will increase Metformin to 1000 mg po bid and reassess. minimize simple carbs. Increase exercise as tolerated. Patient has notified us he is not interested in increasing his metformin. He is asked to consider adding Actos 15 mg daily      Relevant Orders   Hemoglobin A1c (Completed)   Morbid obesity ([place])    Encouraged Mediterranean, DASH or MIND diet, decrease po intake and increase exercise as tolerated. Needs 7-8 hours of sleep nightly. Avoid trans fats, eat small, frequent meals every 4-5 hours with lean proteins,  complex carbs and healthy fats. Minimize simple carbs, high fat foods and processed foods. Could consider a meal prep delivery service such as Leitha Schuller also      Blood in stool   Rectal cancer ([place])    They continue with the staging process to determine if his cancer is indeed T1 or T2 and he has a procedure later this week to retrieve tissue so they can finish formulating his plan of care.       CAD (coronary artery disease)    Diagnosed on recent imaging for his cancer staging. Has appt today for cardiac evaluation and clearance.      Other Visit Diagnoses     Anemia, unspecified type       Relevant Orders   CBC (Completed)      No orders of the defined types were placed in this encounter.  I, Penni Homans, MD, personally preformed the services described in this documentation.  All medical record entries made by the scribe were at my direction and in my presence.  I have reviewed the chart and discharge instructions (if applicable) and agree that the record reflects my personal performance and is accurate and complete. 06/19/2022  I,Mohammed Iqbal,acting as a scribe for Penni Homans, MD.,have documented all relevant documentation on the behalf of Penni Homans, MD,as directed by  Penni Homans, MD while in the presence of Penni Homans, MD.  Penni Homans, MD

## 2022-06-20 NOTE — Assessment & Plan Note (Signed)
They continue with the staging process to determine if his cancer is indeed T1 or T2 and he has a procedure later this week to retrieve tissue so they can finish formulating his plan of care.

## 2022-06-20 NOTE — Progress Notes (Signed)
Called pt was advised and pt seen mychart message Pt stated he was already on metformin 500 mg twice a day. Pt stated the cardiology said he don't want him starting atorvastatin until cancer surgery.

## 2022-06-20 NOTE — Assessment & Plan Note (Signed)
Cardiology has recommended a LDL goal of <70 and patient is agreeable but they have recommended we institute therapy after his cancer surgery so we will hold off on adding Atorvastatin 10 mg tabs, 1 daily until after surgery. Maintain heart healthy diet such as Mediterranean, MIND or DASH diet, increase exercise, avoid trans fats, simple carbohydrates and processed foods, consider a krill or fish or flaxseed oil cap daily.

## 2022-06-20 NOTE — Assessment & Plan Note (Signed)
Well controlled, no changes to meds. Encouraged heart healthy diet such as the DASH diet and exercise as tolerated.  °

## 2022-06-20 NOTE — Assessment & Plan Note (Signed)
Encouraged Mediterranean, DASH or MIND diet, decrease po intake and increase exercise as tolerated. Needs 7-8 hours of sleep nightly. Avoid trans fats, eat small, frequent meals every 4-5 hours with lean proteins, complex carbs and healthy fats. Minimize simple carbs, high fat foods and processed foods. Could consider a meal prep delivery service such as Leitha Schuller also

## 2022-06-21 ENCOUNTER — Ambulatory Visit (HOSPITAL_COMMUNITY): Payer: BLUE CROSS/BLUE SHIELD | Admitting: Anesthesiology

## 2022-06-21 ENCOUNTER — Ambulatory Visit (HOSPITAL_COMMUNITY)
Admission: RE | Admit: 2022-06-21 | Discharge: 2022-06-21 | Disposition: A | Discharge status: Home / Self Care | Payer: BLUE CROSS/BLUE SHIELD | Attending: Gastroenterology | Admitting: Gastroenterology

## 2022-06-21 ENCOUNTER — Other Ambulatory Visit: Payer: Self-pay

## 2022-06-21 ENCOUNTER — Encounter (HOSPITAL_COMMUNITY): Payer: Self-pay | Admitting: Gastroenterology

## 2022-06-21 ENCOUNTER — Encounter (HOSPITAL_COMMUNITY)
Admission: RE | Disposition: A | Discharge status: Home / Self Care | Payer: Self-pay | Source: Home / Self Care | Attending: Gastroenterology

## 2022-06-21 DIAGNOSIS — Z6835 Body mass index (BMI) 35.0-35.9, adult: Secondary | ICD-10-CM | POA: Diagnosis not present

## 2022-06-21 DIAGNOSIS — E119 Type 2 diabetes mellitus without complications: Secondary | ICD-10-CM | POA: Insufficient documentation

## 2022-06-21 DIAGNOSIS — K6289 Other specified diseases of anus and rectum: Secondary | ICD-10-CM

## 2022-06-21 DIAGNOSIS — C20 Malignant neoplasm of rectum: Secondary | ICD-10-CM | POA: Diagnosis not present

## 2022-06-21 DIAGNOSIS — I899 Noninfective disorder of lymphatic vessels and lymph nodes, unspecified: Secondary | ICD-10-CM | POA: Diagnosis not present

## 2022-06-21 DIAGNOSIS — K219 Gastro-esophageal reflux disease without esophagitis: Secondary | ICD-10-CM | POA: Diagnosis not present

## 2022-06-21 DIAGNOSIS — Z7984 Long term (current) use of oral hypoglycemic drugs: Secondary | ICD-10-CM | POA: Insufficient documentation

## 2022-06-21 DIAGNOSIS — Z87891 Personal history of nicotine dependence: Secondary | ICD-10-CM | POA: Insufficient documentation

## 2022-06-21 DIAGNOSIS — I2510 Atherosclerotic heart disease of native coronary artery without angina pectoris: Secondary | ICD-10-CM | POA: Diagnosis not present

## 2022-06-21 DIAGNOSIS — E669 Obesity, unspecified: Secondary | ICD-10-CM | POA: Diagnosis not present

## 2022-06-21 DIAGNOSIS — C218 Malignant neoplasm of overlapping sites of rectum, anus and anal canal: Secondary | ICD-10-CM | POA: Insufficient documentation

## 2022-06-21 DIAGNOSIS — K5730 Diverticulosis of large intestine without perforation or abscess without bleeding: Secondary | ICD-10-CM | POA: Insufficient documentation

## 2022-06-21 DIAGNOSIS — K641 Second degree hemorrhoids: Secondary | ICD-10-CM | POA: Diagnosis not present

## 2022-06-21 DIAGNOSIS — I10 Essential (primary) hypertension: Secondary | ICD-10-CM | POA: Insufficient documentation

## 2022-06-21 DIAGNOSIS — E785 Hyperlipidemia, unspecified: Secondary | ICD-10-CM | POA: Diagnosis not present

## 2022-06-21 HISTORY — PX: EUS: SHX5427

## 2022-06-21 HISTORY — PX: FLEXIBLE SIGMOIDOSCOPY: SHX5431

## 2022-06-21 HISTORY — DX: Anemia, unspecified: D64.9

## 2022-06-21 HISTORY — DX: Malignant (primary) neoplasm, unspecified: C80.1

## 2022-06-21 HISTORY — DX: Unspecified osteoarthritis, unspecified site: M19.90

## 2022-06-21 LAB — GLUCOSE, CAPILLARY: Glucose-Capillary: 92 mg/dL (ref 70–99)

## 2022-06-21 SURGERY — ULTRASOUND, LOWER GI TRACT, ENDOSCOPIC
Anesthesia: General

## 2022-06-21 MED ORDER — SODIUM CHLORIDE 0.9 % IV SOLN: INTRAVENOUS | Status: DC

## 2022-06-21 MED ORDER — PROPOFOL 1000 MG/100ML IV EMUL
INTRAVENOUS | Status: AC
  Filled 2022-06-21: qty 100

## 2022-06-21 MED ORDER — PROPOFOL 10 MG/ML IV BOLUS
INTRAVENOUS | Status: DC | PRN
  Administered 2022-06-21 (×2): 20 mg via INTRAVENOUS

## 2022-06-21 MED ORDER — LIDOCAINE 2% (20 MG/ML) 5 ML SYRINGE
INTRAMUSCULAR | Status: DC | PRN
  Administered 2022-06-21: 100 mg via INTRAVENOUS

## 2022-06-21 MED ORDER — LACTATED RINGERS IV SOLN: INTRAVENOUS | Status: DC

## 2022-06-21 MED ORDER — PROPOFOL 500 MG/50ML IV EMUL
INTRAVENOUS | Status: DC | PRN
  Administered 2022-06-21: 100 ug/kg/min via INTRAVENOUS

## 2022-06-21 NOTE — Anesthesia Postprocedure Evaluation (Signed)
Anesthesia Post Note  Patient: Robert [name]  Procedure(s) Performed: LOWER ENDOSCOPIC ULTRASOUND (EUS) FLEXIBLE SIGMOIDOSCOPY     Patient location during evaluation: PACU Anesthesia Type: General Level of consciousness: awake and alert and oriented Pain management: pain level controlled Vital Signs Assessment: post-procedure vital signs reviewed and stable Respiratory status: spontaneous breathing, nonlabored ventilation and respiratory function stable Cardiovascular status: blood pressure returned to baseline and stable Postop Assessment: no apparent nausea or vomiting Anesthetic complications: no   No notable events documented.  Last Vitals:  Vitals:   06/21/22 1419 06/21/22 1550  BP: (!) 176/98 (!) 145/88  Pulse:  65  Resp: 16 (!) 24  Temp: (!) 36.4 C   SpO2: 96% 99%    Last Pain:  Vitals:   06/21/22 1550  TempSrc:   PainSc: Asleep                 [name] A.

## 2022-06-21 NOTE — Anesthesia Procedure Notes (Signed)
Date/Time: 06/21/2022 3:13 PM  Performed by: Sharlette Dense, CRNAOxygen Delivery Method: Simple face mask

## 2022-06-21 NOTE — Discharge Instructions (Signed)
YOU HAD AN ENDOSCOPIC PROCEDURE TODAY: Refer to the procedure report and other information in the discharge instructions given to you for any specific questions about what was found during the examination. If this information does not answer your questions, please call [place] office at [phone] to clarify.  ° °YOU SHOULD EXPECT: Some feelings of bloating in the abdomen. Passage of more gas than usual. Walking can help get rid of the air that was put into your GI tract during the procedure and reduce the bloating. If you had a lower endoscopy (such as a colonoscopy or flexible sigmoidoscopy) you may notice spotting of blood in your stool or on the toilet paper. Some abdominal soreness may be present for a day or two, also. ° °DIET: Your first meal following the procedure should be a light meal and then it is ok to progress to your normal diet. A half-sandwich or bowl of soup is an example of a good first meal. Heavy or fried foods are harder to digest and may make you feel nauseous or bloated. Drink plenty of fluids but you should avoid alcoholic beverages for 24 hours. If you had a esophageal dilation, please see attached instructions for diet.   ° °ACTIVITY: Your care partner should take you home directly after the procedure. You should plan to take it easy, moving slowly for the rest of the day. You can resume normal activity the day after the procedure however YOU SHOULD NOT DRIVE, use power tools, machinery or perform tasks that involve climbing or major physical exertion for 24 hours (because of the sedation medicines used during the test).  ° °SYMPTOMS TO REPORT IMMEDIATELY: °A gastroenterologist can be reached at any hour. Please call [phone]  for any of the following symptoms:  °Following lower endoscopy (colonoscopy, flexible sigmoidoscopy) °Excessive amounts of blood in the stool  °Significant tenderness, worsening of abdominal pains  °Swelling of the abdomen that is new, acute  °Fever of 100° or  higher  °Following upper endoscopy (EGD, EUS, ERCP, esophageal dilation) °Vomiting of blood or coffee ground material  °New, significant abdominal pain  °New, significant chest pain or pain under the shoulder blades  °Painful or persistently difficult swallowing  °New shortness of breath  °Black, tarry-looking or red, bloody stools ° °FOLLOW UP:  °If any biopsies were taken you will be contacted by phone or by letter within the next 1-3 weeks. Call [phone]  if you have not heard about the biopsies in 3 weeks.  °Please also call with any specific questions about appointments or follow up tests. ° °

## 2022-06-21 NOTE — Op Note (Signed)
[facility] Patient Name: Robert [name] Procedure Date: 06/21/2022 MRN: [id number] Attending MD: Justice Britain , MD, [id number] Date of Birth: 05-Jul-1971 CSN: [id number] Age: 52 Admit Type: Outpatient Procedure:                Lower EUS Indications:              Exclude involvement of internal & external                            sphincters (anorectal carcinoma), Pre-treatment                            staging for anorectal carcinoma Providers:                Justice Britain, MD, Benay Pillow, RN, Cletis Athens, Technician Referring MD:             Wilhemina Cash. Esau Grew. White MD,                            MD, Pricilla Riffle. Fuller Plan, MD, Bonnita Levan Charlett Blake Medicines:                Monitored Anesthesia Care Complications:            No immediate complications. Estimated Blood Loss:     Estimated blood loss was minimal. Estimated blood                            loss: none. Procedure:                Pre-Anesthesia Assessment:                           - Prior to the procedure, a History and Physical                            was performed, and patient medications and                            allergies were reviewed. The patient's tolerance of                            previous anesthesia was also reviewed. The risks                            and benefits of the procedure and the sedation                            options and risks were discussed with the patient.                            All questions were answered, and informed consent  was obtained. Prior Anticoagulants: The patient has                            taken no anticoagulant or antiplatelet agents. ASA                            Grade Assessment: II - A patient with mild systemic                            disease. After reviewing the risks and benefits,                            the patient was deemed in satisfactory condition  to                            undergo the procedure.                           After obtaining informed consent, the endoscope was                            passed under direct vision. Throughout the                            procedure, the patient's blood pressure, pulse, and                            oxygen saturations were monitored continuously. The                            [id number] ([id number]) Olympus endoscope was introduced                            through the anus and advanced to the the descending                            colon for ultrasound. The [id number] ([id number])                            Olympus radial ultrasound scope was introduced                            through the anus and advanced to the the sigmoid                            colon for ultrasound. The lower EUS was                            accomplished without difficulty. The patient                            tolerated the procedure. The quality of the bowel  preparation was adequate. Scope In: 3:20:45 PM Scope Out: 3:47:06 PM Total Procedure Duration: 0 hours 26 minutes 21 seconds  Findings:      ENDOSCOPIC FINDING: :      Multiple small-mouthed diverticula were found in the recto-sigmoid colon       and sigmoid colon.      An infiltrative, polypoid and ulcerated non-obstructing medium-sized       mass was found in the proximal rectum. The mass was partially       circumferential (involving one-third of the lumen circumference). The       mass measured three cm in length. In addition, its diameter measured       twenty-two mm. No bleeding was present.      Non-bleeding non-thrombosed internal hemorrhoids were found during       retroflexion, during perianal exam and during digital exam. The       hemorrhoids were Grade II (internal hemorrhoids that prolapse but reduce       spontaneously).      ENDOSONOGRAPHIC FINDING: :      A hypoechoic mass was found in the rectum. The  mass was encountered from       11 to 14 cm (from the anal verge). The mass was partially       circumferential (involving 40% of the lumen). The endosonographic       borders were irregular. The mass measured 27 mm (in maximum length) by       14 mm (in maximum thickness). There was sonographic evidence suggesting       invasion into the muscularis propria (Layer 4) without breakthrough into       the perirectal fat. There was no sonographic evidence of invasion into       the perirectal fat (Layer 5) or perirectal fat.      No malignant-appearing lymph nodes were visualized in the perirectal       region and in the left iliac region. The nodes were.      The internal anal sphincter was visualized endosonographically and       appeared normal. Impression:               FLEX Impression:                           - Diverticulosis in the recto-sigmoid colon and in                            the sigmoid colon.                           - Malignant tumor in the proximal rectum (11-14 cm)                           - Non-bleeding non-thrombosed internal hemorrhoids.                           EUS Impression:                           - Rectal mass was visualized endosonographically.  It invades the muscularis propria but does not                            extend through it. A tissue diagnosis was obtained                            prior to this exam. This is consistent with                            adenocarcinoma. This was staged uT2 uN0.                           - No malignant-appearing lymph nodes were                            visualized endosonographically in the perirectal                            region and in the left iliac region.                           - The internal anal sphincter was visualized                            endosonographically and appeared normal. Moderate Sedation:      Not Applicable - Patient had care per  Anesthesia. Recommendation:           - The patient will be observed post-procedure,                            until all discharge criteria are met.                           - Discharge patient to home.                           - Patient has a contact number available for                            emergencies. The signs and symptoms of potential                            delayed complications were discussed with the                            patient. Return to normal activities tomorrow.                            Written discharge instructions were provided to the                            patient.                           - Resume previous diet.                           -  Will forward results to Oncologic team to decide                            next steps in treatment planning.                           - The findings and recommendations were discussed                            with the patient.                           - The findings and recommendations were discussed                            with the patient's family. Procedure Code(s):        --- Professional ---                           [phone], Sigmoidoscopy, flexible; with endoscopic                            ultrasound examination Diagnosis Code(s):        --- Professional ---                           K64.1, Second degree hemorrhoids                           C20, Malignant neoplasm of rectum                           K62.89, Other specified diseases of anus and rectum                           I89.9, Noninfective disorder of lymphatic vessels                            and lymph nodes, unspecified                           C21.8, Malignant neoplasm of overlapping sites of                            rectum, anus and anal canal                           K57.30, Diverticulosis of large intestine without                            perforation or abscess without bleeding CPT copyright 2022 American Medical  Association. All rights reserved. The codes documented in this report are preliminary and upon coder review may  be revised to meet current compliance requirements. Justice Britain, MD 06/21/2022 4:03:11 PM Number of Addenda: 0

## 2022-06-21 NOTE — H&P (Signed)
GASTROENTEROLOGY PROCEDURE H&P NOTE   Primary Care Physician: [name], MD  HPI: Robert [name] is a 51 y.o. male who presents for Sigmiodoscopy/EUS for staging recent rectal cancer diagnosis.  Past Medical History:  Diagnosis Date   Anemia    Arthralgia 01/11/2013   Diffuse, b/l Hips R>L Knees, ankles, low back   Arthritis    Back pain 12/08/2015   Cancer ([place])    Diabetes mellitus type 2 in obese ([place]) 11/09/2012   Dyslipidemia 11/09/2012   Esophageal reflux 12/08/2015   History of migraine headaches    Hyperglycemia 11/09/2012   Hypertension    Past Surgical History:  Procedure Laterality Date   NO PAST SURGERIES     Current Facility-Administered Medications  Medication Dose Route Frequency Provider Last Rate Last Admin   0.9 %  sodium chloride infusion   Intravenous Continuous Mansouraty, Telford Nab., MD       lactated ringers infusion   Intravenous Continuous Mansouraty, Telford Nab., MD 10 mL/hr at 06/21/22 1429 New Bag at 06/21/22 1429    Current Facility-Administered Medications:    0.9 %  sodium chloride infusion, , Intravenous, Continuous, Mansouraty, Telford Nab., MD   lactated ringers infusion, , Intravenous, Continuous, Mansouraty, Telford Nab., MD, Last Rate: 10 mL/hr at 06/21/22 1429, New Bag at 06/21/22 1429 Allergies  Allergen Reactions   Mucinex [Guaifenesin Er] Shortness Of Breath, Itching and Dermatitis    Swelling of hands and feet   Nsaids     Aleve, Advil cause swelling, itching and rash   Latex Rash   Family History  Problem Relation Age of Onset   Arthritis Mother    Hypertension Mother    Diabetes Mother    Cancer Mother    Arthritis Father    Hypertension Father    Diabetes Father    Sleep apnea Father    Obesity Father    Arthritis Maternal Grandmother    Diabetes Maternal Grandmother    Stroke Maternal Grandmother    Arthritis Paternal Grandmother    Diabetes Paternal Grandmother    Cancer Paternal Grandmother         lung, smoker   Diabetes Paternal Grandfather    Heart disease Paternal Grandfather 20       MI   Hyperlipidemia Brother    Hypertension Brother    Diabetes Brother    COPD Maternal Grandfather    Kidney disease Neg Hx    Social History   Socioeconomic History   Marital status: Married    Spouse name: Not on file   Number of children: 4   Years of education: Not on file   Highest education level: Not on file  Occupational History   Occupation: Training and development officer    Employer: [employer]  Tobacco Use   Smoking status: Former    Packs/day: 1.50    Years: 10.00    Total pack years: 15.00    Types: Cigarettes    Quit date: 08/14/1995    Years since quitting: 26.8   Smokeless tobacco: Never  Vaping Use   Vaping Use: Never used  Substance and Sexual Activity   Alcohol use: Yes    Alcohol/week: 6.0 - 10.0 standard drinks of alcohol    Types: 6 - 10 Cans of beer per week    Comment: 2/day, more in the past   Drug use: No   Sexual activity: Yes    Comment: lives with wife and daughter, no dietary restrictions  Other Topics Concern   Not  on file  Social History Narrative   Regular exercise:  Walks 1-2 x weekly   Caffeine use:  3-4 daily   4 children- oldest first marriage 94 (son), 20 son, 42 son, 16 yr old girl   Married   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 GEN: NAD EYE: Sclerae anicteric ENT: MMM CV:  Non-tachycardic GI: Soft, NT/ND NEURO:  Alert & Oriented x 3  Lab Results: Recent Labs    06/19/22 1244  WBC 7.1  HGB 12.5*  HCT 39.2  PLT 220.0   BMET Recent Labs    06/19/22 1244  NA 137  K 4.1  CL 103  CO2 28  GLUCOSE 196*  BUN 13  CREATININE 1.07  CALCIUM 9.2   LFT Recent Labs    06/19/22 1244  PROT 6.5  ALBUMIN 4.2  AST 24  ALT 23  ALKPHOS 67  BILITOT 0.5   PT/INR No results for input(s): "LABPROT", "INR" in the last 72 hours.   Impression / Plan: This is a 51 y.o.male who presents for Sigmiodoscopy/EUS for staging recent rectal cancer diagnosis.  The risks of an EUS including intestinal perforation, bleeding, infection, aspiration, and medication effects were discussed as was the possibility it may not give a definitive diagnosis if a biopsy is performed.   The risks and benefits of endoscopic evaluation/treatment were discussed with the patient and/or family; these include but are not limited to the risk of perforation, infection, bleeding, missed lesions, lack of diagnosis, severe illness requiring hospitalization, as well as anesthesia and sedation related illnesses.  The patient's history has been reviewed, patient examined, no change in status, and deemed stable for procedure.  The patient and/or family is agreeable to proceed.    Justice Britain, MD [facility] Advanced [facility]oscopy Office # [phone]

## 2022-06-21 NOTE — Anesthesia Preprocedure Evaluation (Signed)
Anesthesia Evaluation  Patient identified by MRN, date of birth, ID band Patient awake    Reviewed: Allergy & Precautions, NPO status , Patient's Chart, lab work & pertinent test results  Airway Mallampati: III  TM Distance: >3 FB Neck ROM: Full    Dental  (+) Teeth Intact, Dental Advisory Given, Caps   Pulmonary former smoker   Pulmonary exam normal breath sounds clear to auscultation       Cardiovascular hypertension, Pt. on medications + CAD  Normal cardiovascular exam Rhythm:Regular Rate:Normal     Neuro/Psych negative neurological ROS  negative psych ROS   GI/Hepatic Neg liver ROS,GERD  Medicated and Controlled,,Rectal Ca   Endo/Other  diabetes, Well Controlled, Type 2, Oral Hypoglycemic Agents  Obesity Hyperlipidemia  Renal/GU negative Renal ROS  negative genitourinary   Musculoskeletal  (+) Arthritis , Osteoarthritis,    Abdominal  (+) + obese  Peds  Hematology  (+) Blood dyscrasia, anemia   Anesthesia Other Findings   Reproductive/Obstetrics                              Anesthesia Physical Anesthesia Plan  ASA: 2  Anesthesia Plan: General   Post-op Pain Management: Minimal or no pain anticipated   Induction: Intravenous  PONV Risk Score and Plan: 2 and Treatment may vary due to age or medical condition  Airway Management Planned: Natural Airway, Simple Face Mask and Nasal Cannula  Additional Equipment: None  Intra-op Plan:   Post-operative Plan:   Informed Consent: I have reviewed the patients History and Physical, chart, labs and discussed the procedure including the risks, benefits and alternatives for the proposed anesthesia with the patient or authorized representative who has indicated his/her understanding and acceptance.     Dental advisory given  Plan Discussed with: CRNA and Anesthesiologist  Anesthesia Plan Comments:          Anesthesia  Quick Evaluation

## 2022-06-21 NOTE — Transfer of Care (Signed)
Immediate Anesthesia Transfer of Care Note  Patient: Robert [name]  Procedure(s) Performed: LOWER ENDOSCOPIC ULTRASOUND (EUS) FLEXIBLE SIGMOIDOSCOPY  Patient Location: Endoscopy Unit  Anesthesia Type:MAC  Level of Consciousness: drowsy  Airway & Oxygen Therapy: Patient Spontanous Breathing and Patient connected to face mask oxygen  Post-op Assessment: Report given to RN and Post -op Vital signs reviewed and stable  Post vital signs: Reviewed and stable  Last Vitals:  Vitals Value Taken Time  BP    Temp    Pulse 65 06/21/22 1549  Resp 24 06/21/22 1549  SpO2 99 % 06/21/22 1549  Vitals shown include unvalidated device data.  Last Pain:  Vitals:   06/21/22 1419  TempSrc: Temporal  PainSc: 0-No pain         Complications: No notable events documented.

## 2022-06-24 ENCOUNTER — Encounter (HOSPITAL_COMMUNITY): Payer: Self-pay | Admitting: Gastroenterology

## 2022-06-25 ENCOUNTER — Other Ambulatory Visit: Payer: Self-pay

## 2022-06-25 NOTE — Telephone Encounter (Signed)
Called pt Lvm regarding medication need to discuss to call our office back

## 2022-07-13 ENCOUNTER — Ambulatory Visit: Payer: Self-pay | Admitting: Surgery

## 2022-07-13 ENCOUNTER — Ambulatory Visit (HOSPITAL_COMMUNITY): Payer: BLUE CROSS/BLUE SHIELD | Attending: Internal Medicine

## 2022-07-13 DIAGNOSIS — R739 Hyperglycemia, unspecified: Secondary | ICD-10-CM

## 2022-07-13 DIAGNOSIS — I517 Cardiomegaly: Secondary | ICD-10-CM | POA: Diagnosis present

## 2022-07-13 DIAGNOSIS — Z01818 Encounter for other preprocedural examination: Secondary | ICD-10-CM

## 2022-07-13 LAB — ECHOCARDIOGRAM COMPLETE
AR max vel: 3.28 cm2
AV Area VTI: 3.18 cm2
AV Area mean vel: 3.3 cm2
AV Mean grad: 8.5 mmHg
AV Peak grad: 17.5 mmHg
Ao pk vel: 2.09 m/s
Area-P 1/2: 2.92 cm2
P 1/2 time: 487 msec
S' Lateral: 3.7 cm

## 2022-07-13 NOTE — Progress Notes (Signed)
Sent message, via epic in basket, requesting orders in epic from surgeon.  

## 2022-07-19 NOTE — Patient Instructions (Addendum)
DUE TO COVID-19 ONLY TWO VISITORS  (aged 53 and older)  ARE ALLOWED TO COME WITH YOU AND STAY IN THE WAITING ROOM ONLY DURING PRE OP AND PROCEDURE.   **NO VISITORS ARE ALLOWED IN THE SHORT STAY AREA OR RECOVERY ROOM!!**  IF YOU WILL BE ADMITTED INTO THE HOSPITAL YOU ARE ALLOWED ONLY FOUR SUPPORT PEOPLE DURING VISITATION HOURS ONLY (7 AM -8PM)   The support person(s) must pass our screening, gel in and out, and wear a mask at all times, including in the patient's room. Patients must also wear a mask when staff or their support person are in the room. Visitors GUEST BADGE MUST BE WORN VISIBLY  One adult visitor may remain with you overnight and MUST be in the room by 8 P.M.     Your procedure is scheduled on: 07/27/22   Report to [facility] Main Entrance    Report to admitting at  9:30 AM   Call this number if you have problems the morning of surgery [phone]   Do not eat food :After Midnight.   After Midnight you may have the following liquids until _8:30_____ AM/  DAY OF SURGERY  Water Black Coffee (sugar ok, NO MILK/CREAM OR CREAMERS)  Tea (sugar ok, NO MILK/CREAM OR CREAMERS) regular and decaf                             Plain Jell-O (NO RED)                                           Fruit ices (not with fruit pulp, NO RED)                                     Popsicles (NO RED)                                                                  Juice: apple, WHITE grape, WHITE cranberry Sports drinks like Gatorade (NO RED)              Drink 2 /G2 drinks AT 10:00 PM the night before surgery.        The day of surgery:  Drink ONE G2 at 8:15 AM the morning of surgery. Drink in one sitting. Do not sip.  This drink was given to you during your hospital  pre-op appointment visit. Nothing else to drink after completing the   G2. At 8:30 AM          If you have questions, please contact your surgeon's office.   FOLLOW BOWEL PREP AND ANY ADDITIONAL PRE OP  INSTRUCTIONS YOU RECEIVED FROM YOUR SURGEON'S OFFICE!!!              Drink plenty of fluids to prevent dehydration on bowel prep day   Oral Hygiene is also important to reduce your risk of infection.  Remember - BRUSH YOUR TEETH THE MORNING OF SURGERY WITH YOUR REGULAR TOOTHPASTE  DENTURES WILL BE REMOVED PRIOR TO SURGERY PLEASE DO NOT APPLY "Poly grip" OR ADHESIVES!!!   Do NOT smoke after Midnight   Take these medicines the morning of surgery with A SIP OF WATER: Carvedilol                                                                                                                           Amlodipine                                                                                                                           nexium  DO NOT TAKE ANY ORAL DIABETIC MEDICATIONS DAY OF YOUR SURGERY (Metformin)  Bring CPAP mask and tubing day of surgery.                              You may not have any metal on your body including  jewelry, and body piercing             Do not wear lotions, powders, perfumes/cologne, or deodorant               Men may shave face and neck.   Do not bring valuables to the hospital. [place].   Contacts, glasses, or bridgework may not be worn into surgery.   Bring small overnight bag day of surgery.   DO NOT [place]. PHARMACY WILL DISPENSE MEDICATIONS LISTED ON YOUR MEDICATION LIST TO YOU DURING YOUR ADMISSION [place]!     Special Instructions: Bring a copy of your healthcare power of attorney and living will documents the day of surgery if you haven't scanned them before.              Please read over the following fact sheets you were given: IF YOU HAVE QUESTIONS ABOUT YOUR PRE-OP INSTRUCTIONS PLEASE CALL [phone]    [facility] - Preparing for Surgery Before surgery, you can play an important role.  Because skin is  not sterile, your skin needs to be as free of germs as possible.  You can reduce the number of germs on your skin by washing with CHG (chlorahexidine gluconate) soap before surgery.  CHG is an antiseptic cleaner which kills germs and bonds with the skin to continue killing germs even after washing. Please DO NOT use if you have an allergy to CHG or antibacterial soaps.  If your skin becomes reddened/irritated stop using the CHG and inform your nurse when you arrive at Short Stay. You may shave your face/neck. Please follow these instructions carefully:  1.  Shower with CHG Soap the night before surgery and the  morning of Surgery.  2.  If you choose to wash your hair, wash your hair first as usual with your  normal  shampoo.  3.  After you shampoo, rinse your hair and body thoroughly to remove the  shampoo.                            4.  Use CHG as you would any other liquid soap.  You can apply chg directly  to the skin and wash                       Gently with a scrungie or clean washcloth.  5.  Apply the CHG Soap to your body ONLY FROM THE NECK DOWN.   Do not use on face/ open                           Wound or open sores. Avoid contact with eyes, ears mouth and genitals (private parts).                       Wash face,  Genitals (private parts) with your normal soap.             6.  Wash thoroughly, paying special attention to the area where your surgery  will be performed.  7.  Thoroughly rinse your body with warm water from the neck down.  8.  DO NOT shower/wash with your normal soap after using and rinsing off  the CHG Soap.                9.  Pat yourself dry with a clean towel.            10.  Wear clean pajamas.            11.  Place clean sheets on your bed the night of your first shower and do not  sleep with pets. Day of Surgery : Do not apply any lotions/deodorants the morning of surgery.  Please wear clean clothes to the hospital/surgery center.  FAILURE TO FOLLOW THESE  INSTRUCTIONS MAY RESULT IN THE CANCELLATION OF YOUR SURGERY   ________________________________________________________________________  Incentive Spirometer  An incentive spirometer is a tool that can help keep your lungs clear and active. This tool measures how well you are filling your lungs with each breath. Taking long deep breaths may help reverse or decrease the chance of developing breathing (pulmonary) problems (especially infection) following: A long period of time when you are unable to move or be active. BEFORE THE PROCEDURE  If the spirometer includes an indicator to show your best effort, your nurse or respiratory therapist will set it to a desired goal. If possible, sit up straight or lean slightly forward. Try not to slouch. Hold the incentive spirometer in an upright position. INSTRUCTIONS FOR USE  Sit on the edge of your bed if possible, or sit up as far as  you can in bed or on a chair. Hold the incentive spirometer in an upright position. Breathe out normally. Place the mouthpiece in your mouth and seal your lips tightly around it. Breathe in slowly and as deeply as possible, raising the piston or the ball toward the top of the column. Hold your breath for 3-5 seconds or for as long as possible. Allow the piston or ball to fall to the bottom of the column. Remove the mouthpiece from your mouth and breathe out normally. Rest for a few seconds and repeat Steps 1 through 7 at least 10 times every 1-2 hours when you are awake. Take your time and take a few normal breaths between deep breaths. The spirometer may include an indicator to show your best effort. Use the indicator as a goal to work toward during each repetition. After each set of 10 deep breaths, practice coughing to be sure your lungs are clear. If you have an incision (the cut made at the time of surgery), support your incision when coughing by placing a pillow or rolled up towels firmly against it. Once you are  able to get out of bed, walk around indoors and cough well. You may stop using the incentive spirometer when instructed by your caregiver.  RISKS AND COMPLICATIONS Take your time so you do not get dizzy or light-headed. If you are in pain, you may need to take or ask for pain medication before doing incentive spirometry. It is harder to take a deep breath if you are having pain. AFTER USE Rest and breathe slowly and easily. It can be helpful to keep track of a log of your progress. Your caregiver can provide you with a simple table to help with this. If you are using the spirometer at home, follow these instructions: [place] IF:  You are having difficultly using the spirometer. You have trouble using the spirometer as often as instructed. Your pain medication is not giving enough relief while using the spirometer. You develop fever of 100.5 F (38.1 C) or higher. SEEK IMMEDIATE MEDICAL CARE IF:  You cough up bloody sputum that had not been present before. You develop fever of 102 F (38.9 C) or greater. You develop worsening pain at or near the incision site. MAKE SURE YOU:  Understand these instructions. Will watch your condition. Will get help right away if you are not doing well or get worse. Document Released: 12/10/2006 Document Revised: 10/22/2011 Document Reviewed: 02/10/2007 [facility] Patient Information 2014 [place], [place].   ________________________________________________________________________

## 2022-07-20 ENCOUNTER — Encounter (HOSPITAL_COMMUNITY): Payer: Self-pay

## 2022-07-20 ENCOUNTER — Other Ambulatory Visit: Payer: Self-pay

## 2022-07-20 ENCOUNTER — Encounter (HOSPITAL_COMMUNITY)
Admission: RE | Admit: 2022-07-20 | Discharge: 2022-07-20 | Disposition: A | Discharge status: Home / Self Care | Payer: BLUE CROSS/BLUE SHIELD | Source: Ambulatory Visit | Attending: Surgery | Admitting: Surgery

## 2022-07-20 DIAGNOSIS — I10 Essential (primary) hypertension: Secondary | ICD-10-CM | POA: Diagnosis not present

## 2022-07-20 DIAGNOSIS — Z7985 Long-term (current) use of injectable non-insulin antidiabetic drugs: Secondary | ICD-10-CM | POA: Insufficient documentation

## 2022-07-20 DIAGNOSIS — E119 Type 2 diabetes mellitus without complications: Secondary | ICD-10-CM | POA: Diagnosis not present

## 2022-07-20 DIAGNOSIS — Z01812 Encounter for preprocedural laboratory examination: Secondary | ICD-10-CM | POA: Insufficient documentation

## 2022-07-20 DIAGNOSIS — I2510 Atherosclerotic heart disease of native coronary artery without angina pectoris: Secondary | ICD-10-CM | POA: Diagnosis not present

## 2022-07-20 DIAGNOSIS — Z87891 Personal history of nicotine dependence: Secondary | ICD-10-CM | POA: Insufficient documentation

## 2022-07-20 DIAGNOSIS — I351 Nonrheumatic aortic (valve) insufficiency: Secondary | ICD-10-CM | POA: Insufficient documentation

## 2022-07-20 DIAGNOSIS — R739 Hyperglycemia, unspecified: Secondary | ICD-10-CM

## 2022-07-20 DIAGNOSIS — C20 Malignant neoplasm of rectum: Secondary | ICD-10-CM | POA: Diagnosis not present

## 2022-07-20 DIAGNOSIS — Z79899 Other long term (current) drug therapy: Secondary | ICD-10-CM | POA: Diagnosis not present

## 2022-07-20 DIAGNOSIS — Z01818 Encounter for other preprocedural examination: Secondary | ICD-10-CM

## 2022-07-20 DIAGNOSIS — Z794 Long term (current) use of insulin: Secondary | ICD-10-CM | POA: Insufficient documentation

## 2022-07-20 HISTORY — DX: Atherosclerotic heart disease of native coronary artery without angina pectoris: I25.10

## 2022-07-20 LAB — COMPREHENSIVE METABOLIC PANEL
ALT: 27 U/L (ref 0–44)
AST: 26 U/L (ref 15–41)
Albumin: 4.1 g/dL (ref 3.5–5.0)
Alkaline Phosphatase: 56 U/L (ref 38–126)
Anion gap: 10 (ref 5–15)
BUN: 14 mg/dL (ref 6–20)
CO2: 24 mmol/L (ref 22–32)
Calcium: 9.1 mg/dL (ref 8.9–10.3)
Chloride: 109 mmol/L (ref 98–111)
Creatinine, Ser: 1 mg/dL (ref 0.61–1.24)
GFR, Estimated: >60 mL/min (ref >60)
Glucose, Bld: 178 mg/dL — ABNORMAL HIGH (ref 70–99)
Potassium: 4 mmol/L (ref 3.5–5.1)
Sodium: 143 mmol/L (ref 135–145)
Total Bilirubin: 0.6 mg/dL (ref 0.3–1.2)
Total Protein: 6.7 g/dL (ref 6.5–8.1)

## 2022-07-20 LAB — CBC WITH DIFFERENTIAL/PLATELET
Abs Immature Granulocytes: 0.02 10*3/uL (ref 0.00–0.07)
Basophils Absolute: 0.1 10*3/uL (ref 0.0–0.1)
Basophils Relative: 1 %
Eosinophils Absolute: 0.4 10*3/uL (ref 0.0–0.5)
Eosinophils Relative: 6 %
HCT: 39.1 % (ref 39.0–52.0)
Hemoglobin: 12 g/dL — ABNORMAL LOW (ref 13.0–17.0)
Immature Granulocytes: 0 %
Lymphocytes Relative: 28 %
Lymphs Abs: 1.9 10*3/uL (ref 0.7–4.0)
MCH: 25.3 pg — ABNORMAL LOW (ref 26.0–34.0)
MCHC: 30.7 g/dL (ref 30.0–36.0)
MCV: 82.3 fL (ref 80.0–100.0)
Monocytes Absolute: 0.5 10*3/uL (ref 0.1–1.0)
Monocytes Relative: 7 %
Neutro Abs: 3.9 10*3/uL (ref 1.7–7.7)
Neutrophils Relative %: 58 %
Platelets: 218 10*3/uL (ref 150–400)
RBC: 4.75 MIL/uL (ref 4.22–5.81)
RDW: 14.6 % (ref 11.5–15.5)
WBC: 6.7 10*3/uL (ref 4.0–10.5)
nRBC: 0 % (ref 0.0–0.2)

## 2022-07-20 LAB — GLUCOSE, CAPILLARY: Glucose-Capillary: 169 mg/dL — ABNORMAL HIGH (ref 70–99)

## 2022-07-20 NOTE — Consult Note (Signed)
[place] Nurse ostomy consult note [place] Nurse requested for preoperative stoma site marking by Dr. Dema Severin  Discussed surgical procedure and stoma creation with patient.  Explained role of the [place] nurse team.  He believes that the chance of having an ostomy is small, but consents to a marking. Answered patient questions.   Examined patient sitting and standing in order to place the marking in the patient's visual field, away from any creases or abdominal contour issues and within the rectus muscle.  Attempted to mark below the patient's belt line, but patient wears his slacks low on the abdomen.   Marked for colostomy in the LLQ  7cm to the left of the umbilicus and 2cm below the umbilicus.  Patient's abdomen cleansed with CHG wipes at site markings, allowed to air dry prior to marking.Covered mark with thin film transparent dressing to preserve mark until date of surgery.   [place] Nurse team will follow up with patient after surgery for continue ostomy care and teaching if an ostomy is created intraopeartively.   Thank you for inviting [place] to participate in this patient's Plan of Care.  Maudie Flakes, MSN, RN, CNS, [place], Serita Grammes, [employer], [employer] phone:  [phone]  [place] MSN, [place], [place], [place]

## 2022-07-20 NOTE — Progress Notes (Signed)
Anesthesia note:  Bowel prep reminder:    PCP - [facility] Cardiologist -Dr. Leward Quan Other-   Chest x-ray - no EKG - 06/19/22-epic Stress Test - no ECHO - 07/13/22-epic Cardiac Cath - no CABG-no Pacemaker/ICD device last checked:no  Sleep Study - no CPAP -    CBG at PAT visit-169 Fasting Blood Sugar at home-1-2 times a week Checks Blood Sugar _137-269____  Blood Thinner:no Blood Thinner Instructions: Aspirin Instructions: Last Dose:  Anesthesia review: Yes / reason:CAD  Patient denies shortness of breath, fever, cough and chest pain at PAT appointment. Pt has no SOB with activities. Hid blood sugars run high . The Dr. Garth Schlatter increased his metformin to BID last week. I told him to monitor his CBGs more often at home and alert the Dr. If they are trending high   Patient verbalized understanding of instructions that were given to them at the PAT appointment. Patient was also instructed that they will need to review over the PAT instructions again at home before surgery.yes

## 2022-07-21 LAB — HEMOGLOBIN A1C
Hgb A1c MFr Bld: 7.9 % — ABNORMAL HIGH (ref 4.8–5.6)
Mean Plasma Glucose: 180 mg/dL

## 2022-07-22 ENCOUNTER — Other Ambulatory Visit: Payer: Self-pay | Admitting: Family Medicine

## 2022-07-22 DIAGNOSIS — Z0000 Encounter for general adult medical examination without abnormal findings: Secondary | ICD-10-CM

## 2022-07-22 DIAGNOSIS — E669 Obesity, unspecified: Secondary | ICD-10-CM

## 2022-07-22 DIAGNOSIS — E785 Hyperlipidemia, unspecified: Secondary | ICD-10-CM

## 2022-07-22 DIAGNOSIS — E1169 Type 2 diabetes mellitus with other specified complication: Secondary | ICD-10-CM

## 2022-07-22 DIAGNOSIS — I10 Essential (primary) hypertension: Secondary | ICD-10-CM

## 2022-07-22 DIAGNOSIS — I152 Hypertension secondary to endocrine disorders: Secondary | ICD-10-CM

## 2022-07-23 ENCOUNTER — Inpatient Hospital Stay (HOSPITAL_COMMUNITY): Payer: BLUE CROSS/BLUE SHIELD | Admitting: Certified Registered Nurse Anesthetist

## 2022-07-23 ENCOUNTER — Inpatient Hospital Stay (HOSPITAL_COMMUNITY): Payer: BLUE CROSS/BLUE SHIELD | Admitting: Physician Assistant

## 2022-07-23 NOTE — Progress Notes (Signed)
Anesthesia Chart Review   Case: [id number] Date/Time: 07/27/22 1129   Procedures:      XI ROBOTIC ASSISTED LOWER ANTERIOR RESECTION - 3.5 HOURS ROOM 2     FLEXIBLE SIGMOIDOSCOPY     POSSIBLE DIVERTING LOOP ILEOSTOMY   Anesthesia type: General   Pre-op diagnosis: rectal cancer   Location: [facility] ROOM 02 / [facility]   Surgeons: Ileana Roup, MD       DISCUSSION:51 y.o. former smoker with h/o HTN, DM II, CAD, moderate aortic valve regurgitation, rectal cancer scheduled for above procedure 07/27/2022 with Dr .Harrell Gave Lovena Neighbours.   Pt last seen by cardiology 06/19/2022. Per OV note, "Preoperative Risk Assessment - The Revised Cardiac Risk Index = 1;  0.9% very low risk of perioperative myocardial infarction, pulmonary edema, ventricular fibrillation, cardiac arrest, or complete heart block.  - DASI score of 43 associated with 8 functional mets - No further cardiac testing is recommended prior to surgery.  - The patient may proceed to surgery at acceptable risk.  "  Anticipate pt can proceed with planned procedure barring acute status change.   VS: BP (!) 148/94   Pulse 63   Temp 37 C (Oral)   Resp 18   Ht '6\' 3"'$  (1.905 m)   Wt 126.6 kg   SpO2 97%   BMI 34.87 kg/m   PROVIDERS: [name], MD is PCP   Cardiologist:  Werner Lean, MD  LABS: Labs reviewed: Acceptable for surgery. (all labs ordered are listed, but only abnormal results are displayed)  Labs Reviewed  HEMOGLOBIN A1C - Abnormal; Notable for the following components:      Result Value   Hgb A1c MFr Bld 7.9 (*)    All other components within normal limits  CBC WITH DIFFERENTIAL/PLATELET - Abnormal; Notable for the following components:   Hemoglobin 12.0 (*)    MCH 25.3 (*)    All other components within normal limits  COMPREHENSIVE METABOLIC PANEL - Abnormal; Notable for the following components:   Glucose, Bld 178 (*)    All other components within normal limits  GLUCOSE, CAPILLARY - Abnormal;  Notable for the following components:   Glucose-Capillary 169 (*)    All other components within normal limits  TYPE AND SCREEN     IMAGES:   EKG:   CV: Echo 07/13/2022 1. Left ventricular ejection fraction, by estimation, is 60 to 65%. The  left ventricle has normal function. The left ventricle has no regional  wall motion abnormalities. The left ventricular internal cavity size was  mildly dilated. There is severe left  ventricular hypertrophy. Left ventricular diastolic parameters are  consistent with Grade II diastolic dysfunction (pseudonormalization).   2. Right ventricular systolic function is normal. The right ventricular  size is mildly enlarged.   3. Left atrial size was moderately dilated.   4. The mitral valve is normal in structure. No evidence of mitral valve  regurgitation. No evidence of mitral stenosis.   5. The aortic valve is normal in structure. Aortic valve regurgitation is  moderate. No aortic stenosis is present. Aortic regurgitation PHT measures  487 msec. Aortic valve area, by VTI measures 3.18 cm. Aortic valve mean  gradient measures 8.5 mmHg.  Aortic valve Vmax measures 2.09 m/s.   6. Aortic dilatation noted. 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   7. The inferior vena cava is dilated in size with <50%  respiratory  variability, suggesting right atrial pressure of 15 mmHg.  Past Medical History:  Diagnosis Date   Anemia    Arthralgia 01/11/2013   Diffuse, b/l Hips R>L Knees, ankles, low back   Arthritis    Back pain 12/08/2015   Cancer ([place])    Coronary artery disease    Diabetes mellitus type 2 in obese ([place]) 11/09/2012   Dyslipidemia 11/09/2012   Esophageal reflux 12/08/2015   History of migraine headaches    Hyperglycemia 11/09/2012   Hypertension     Past Surgical History:  Procedure Laterality Date   EUS N/A 06/21/2022   Procedure:  LOWER ENDOSCOPIC ULTRASOUND (EUS);  Surgeon: Irving Copas., MD;  Location: [facility];  Service: Gastroenterology;  Laterality: N/A;   FLEXIBLE SIGMOIDOSCOPY N/A 06/21/2022   Procedure: FLEXIBLE SIGMOIDOSCOPY;  Surgeon: Rush Landmark Telford Nab., MD;  Location: [facility];  Service: Gastroenterology;  Laterality: N/A;    MEDICATIONS:  amLODipine (NORVASC) 5 MG tablet   carvedilol (COREG) 25 MG tablet   esomeprazole (NEXIUM) 20 MG capsule   glucose blood (COOL BLOOD GLUCOSE TEST STRIPS) test strip   Iron, Ferrous Sulfate, 325 (65 Fe) MG TABS   losartan (COZAAR) 100 MG tablet   metFORMIN (GLUCOPHAGE) 500 MG tablet   Multiple Vitamins-Minerals (MULTIVITAMIN WITH MINERALS) tablet   tiZANidine (ZANAFLEX) 2 MG tablet   No current facility-administered medications for this encounter.     Konrad Felix Ward, PA-C [facility] [phone]

## 2022-07-23 NOTE — Anesthesia Preprocedure Evaluation (Addendum)
Anesthesia Evaluation  Patient identified by MRN, date of birth, ID band Patient awake    Reviewed: Allergy & Precautions, NPO status , Patient's Chart, lab work & pertinent test results  History of Anesthesia Complications Negative for: history of anesthetic complications  Airway        Dental   Pulmonary former smoker          Cardiovascular hypertension, Pt. on medications and Pt. on home beta blockers (-) angina + CAD (coronary calcifications)    07/13/2022 ECHO: EF 60-65%, normal LVF, severe LVH, Grade 2 DD, normal RVF, mod AI. Severe dilation of aortic root (91m) and asc aorta (516m   Neuro/Psych negative neurological ROS     GI/Hepatic ,GERD  Controlled,,Rectal cancer   Endo/Other  diabetes (glu 167), Oral Hypoglycemic Agents  obese  Renal/GU      Musculoskeletal  (+) Arthritis ,    Abdominal   Peds  Hematology negative hematology ROS (+)   Anesthesia Other Findings   Reproductive/Obstetrics                             Anesthesia Physical Anesthesia Plan  ASA: 3  Anesthesia Plan:    Post-op Pain Management:    Induction:   PONV Risk Score and Plan:   Airway Management Planned:   Additional Equipment:   Intra-op Plan:   Post-operative Plan:   Informed Consent:   Plan Discussed with:   Anesthesia Plan Comments: (See PAT note 07/20/2022  Discussed new ECHO findings of mod AI along with increasing size of aortic root and ascending aorta with Dr. LiKipp BroodCT Surgery). His recommendation is to repeat CT scan to confirm prox aortic size, given mod AI, prior to proceeding with colorectal surgery.  Discussed with Dr. WhDema Severinpt, and pt's wife. CTA being arranged expeditiously. Surgery postponed until cleared by CT Surgery.  All questions answered.)       Anesthesia Quick Evaluation

## 2022-07-26 ENCOUNTER — Other Ambulatory Visit: Payer: Self-pay | Admitting: Family Medicine

## 2022-07-27 ENCOUNTER — Inpatient Hospital Stay (HOSPITAL_COMMUNITY)
Admission: RE | Admit: 2022-07-27 | Discharge: 2022-07-27 | Disposition: A | Discharge status: Home / Self Care | Payer: BLUE CROSS/BLUE SHIELD | Source: Home / Self Care | Attending: Thoracic Surgery (Cardiothoracic Vascular Surgery) | Admitting: Thoracic Surgery (Cardiothoracic Vascular Surgery)

## 2022-07-27 ENCOUNTER — Telehealth: Payer: Self-pay | Admitting: Internal Medicine

## 2022-07-27 ENCOUNTER — Encounter (HOSPITAL_COMMUNITY)
Admission: RE | Disposition: A | Discharge status: Home / Self Care | Payer: Self-pay | Source: Home / Self Care | Attending: Surgery

## 2022-07-27 ENCOUNTER — Encounter (HOSPITAL_COMMUNITY): Payer: Self-pay | Admitting: Surgery

## 2022-07-27 ENCOUNTER — Ambulatory Visit (HOSPITAL_COMMUNITY)
Admission: RE | Admit: 2022-07-27 | Discharge: 2022-07-27 | Disposition: A | Discharge status: Home / Self Care | Payer: BLUE CROSS/BLUE SHIELD | Attending: Surgery | Admitting: Surgery

## 2022-07-27 DIAGNOSIS — I517 Cardiomegaly: Secondary | ICD-10-CM | POA: Diagnosis not present

## 2022-07-27 DIAGNOSIS — E119 Type 2 diabetes mellitus without complications: Secondary | ICD-10-CM | POA: Diagnosis not present

## 2022-07-27 DIAGNOSIS — Z01812 Encounter for preprocedural laboratory examination: Secondary | ICD-10-CM | POA: Insufficient documentation

## 2022-07-27 DIAGNOSIS — I712 Thoracic aortic aneurysm, without rupture, unspecified: Secondary | ICD-10-CM | POA: Diagnosis not present

## 2022-07-27 LAB — GLUCOSE, CAPILLARY: Glucose-Capillary: 167 mg/dL — ABNORMAL HIGH (ref 70–99)

## 2022-07-27 LAB — TYPE AND SCREEN
ABO/RH(D): A POS
Antibody Screen: NEGATIVE

## 2022-07-27 SURGERY — RESECTION, RECTUM, LOW ANTERIOR, ROBOT-ASSISTED
Anesthesia: General

## 2022-07-27 MED ORDER — BISACODYL 5 MG PO TBEC: 20.0000 mg | DELAYED_RELEASE_TABLET | Freq: Once | ORAL | Status: DC

## 2022-07-27 MED ORDER — DEXAMETHASONE SODIUM PHOSPHATE 10 MG/ML IJ SOLN
INTRAMUSCULAR | Status: AC
  Filled 2022-07-27: qty 1

## 2022-07-27 MED ORDER — ALVIMOPAN 12 MG PO CAPS
12.0000 mg | ORAL_CAPSULE | ORAL | Status: AC
  Administered 2022-07-27: 12 mg via ORAL
  Filled 2022-07-27: qty 1

## 2022-07-27 MED ORDER — CHLORHEXIDINE GLUCONATE 0.12 % MT SOLN
15.0000 mL | Freq: Once | OROMUCOSAL | Status: AC
  Administered 2022-07-27: 15 mL via OROMUCOSAL

## 2022-07-27 MED ORDER — SODIUM CHLORIDE 0.9 % IV SOLN
2.0000 g | INTRAVENOUS | Status: DC
  Filled 2022-07-27: qty 2

## 2022-07-27 MED ORDER — CHLORHEXIDINE GLUCONATE CLOTH 2 % EX PADS: 6.0000 | MEDICATED_PAD | Freq: Once | CUTANEOUS | Status: DC

## 2022-07-27 MED ORDER — ACETAMINOPHEN 500 MG PO TABS
ORAL_TABLET | ORAL | Status: AC
  Filled 2022-07-27: qty 1

## 2022-07-27 MED ORDER — PROPOFOL 10 MG/ML IV BOLUS
INTRAVENOUS | Status: AC
  Filled 2022-07-27: qty 20

## 2022-07-27 MED ORDER — SODIUM CHLORIDE (PF) 0.9 % IJ SOLN
INTRAMUSCULAR | Status: AC
  Filled 2022-07-27: qty 50

## 2022-07-27 MED ORDER — MIDAZOLAM HCL 2 MG/2ML IJ SOLN
INTRAMUSCULAR | Status: AC
  Filled 2022-07-27: qty 2

## 2022-07-27 MED ORDER — METRONIDAZOLE 500 MG PO TABS: 1000.0000 mg | ORAL_TABLET | ORAL | Status: DC

## 2022-07-27 MED ORDER — HEPARIN SODIUM (PORCINE) 5000 UNIT/ML IJ SOLN
5000.0000 [IU] | Freq: Once | INTRAMUSCULAR | Status: AC
  Administered 2022-07-27: 5000 [IU] via SUBCUTANEOUS
  Filled 2022-07-27: qty 1

## 2022-07-27 MED ORDER — ORAL CARE MOUTH RINSE: 15.0000 mL | Freq: Once | OROMUCOSAL | Status: AC

## 2022-07-27 MED ORDER — ENSURE PRE-SURGERY PO LIQD: 296.0000 mL | Freq: Once | ORAL | Status: DC

## 2022-07-27 MED ORDER — NEOMYCIN SULFATE 500 MG PO TABS: 1000.0000 mg | ORAL_TABLET | ORAL | Status: DC

## 2022-07-27 MED ORDER — ENSURE PRE-SURGERY PO LIQD
592.0000 mL | Freq: Once | ORAL | Status: DC
  Filled 2022-07-27: qty 592

## 2022-07-27 MED ORDER — ROCURONIUM BROMIDE 10 MG/ML (PF) SYRINGE
PREFILLED_SYRINGE | INTRAVENOUS | Status: AC
  Filled 2022-07-27: qty 10

## 2022-07-27 MED ORDER — LIDOCAINE HCL 2 % IJ SOLN
INTRAMUSCULAR | Status: AC
  Filled 2022-07-27: qty 20

## 2022-07-27 MED ORDER — IOHEXOL 350 MG/ML SOLN
100.0000 mL | Freq: Once | INTRAVENOUS | Status: AC | PRN
  Administered 2022-07-27: 100 mL via INTRAVENOUS

## 2022-07-27 MED ORDER — FENTANYL CITRATE (PF) 100 MCG/2ML IJ SOLN
INTRAMUSCULAR | Status: AC
  Filled 2022-07-27: qty 2

## 2022-07-27 MED ORDER — ACETAMINOPHEN 500 MG PO TABS
1000.0000 mg | ORAL_TABLET | ORAL | Status: AC
  Administered 2022-07-27: 1000 mg via ORAL
  Filled 2022-07-27: qty 2

## 2022-07-27 MED ORDER — LACTATED RINGERS IV SOLN: INTRAVENOUS | Status: DC

## 2022-07-27 MED ORDER — BUPIVACAINE LIPOSOME 1.3 % IJ SUSP: 20.0000 mL | Freq: Once | INTRAMUSCULAR | Status: DC

## 2022-07-27 MED ORDER — POLYETHYLENE GLYCOL 3350 17 GM/SCOOP PO POWD: 1.0000 | Freq: Once | ORAL | Status: DC

## 2022-07-27 MED ORDER — ONDANSETRON HCL 4 MG/2ML IJ SOLN
INTRAMUSCULAR | Status: AC
  Filled 2022-07-27: qty 2

## 2022-07-27 SURGICAL SUPPLY — 109 items
APPLIER CLIP 5 13 M/L LIGAMAX5 (MISCELLANEOUS)
APPLIER CLIP ROT 10 11.4 M/L (STAPLE)
BAG COUNTER SPONGE SURGICOUNT (BAG) IMPLANT
BLADE EXTENDED COATED 6.5IN (ELECTRODE) ×1 IMPLANT
CANNULA REDUC XI 12-8 STAPL (CANNULA) ×1
CANNULA REDUCER 12-8 DVNC XI (CANNULA) ×1 IMPLANT
CELLS DAT CNTRL 66122 CELL SVR (MISCELLANEOUS) IMPLANT
CHLORAPREP W/TINT 26 (MISCELLANEOUS) ×1 IMPLANT
CLIP APPLIE 5 13 M/L LIGAMAX5 (MISCELLANEOUS) IMPLANT
CLIP APPLIE ROT 10 11.4 M/L (STAPLE) IMPLANT
CLIP LIGATING HEM O LOK PURPLE (MISCELLANEOUS) IMPLANT
CLIP LIGATING HEMO O LOK GREEN (MISCELLANEOUS) IMPLANT
COVER SURGICAL LIGHT HANDLE (MISCELLANEOUS) ×2 IMPLANT
COVER TIP SHEARS 8 DVNC (MISCELLANEOUS) ×1 IMPLANT
COVER TIP SHEARS 8MM DA VINCI (MISCELLANEOUS) ×1
DEFOGGER SCOPE WARMER CLEARIFY (MISCELLANEOUS) ×1 IMPLANT
DEVICE TROCAR PUNCTURE CLOSURE (ENDOMECHANICALS) IMPLANT
DRAIN CHANNEL 19F RND (DRAIN) ×1 IMPLANT
DRAPE ARM DVNC X/XI (DISPOSABLE) ×4 IMPLANT
DRAPE COLUMN DVNC XI (DISPOSABLE) ×1 IMPLANT
DRAPE DA VINCI XI ARM (DISPOSABLE) ×4
DRAPE DA VINCI XI COLUMN (DISPOSABLE) ×1
DRAPE SURG IRRIG POUCH 19X23 (DRAPES) ×1 IMPLANT
DRSG OPSITE POSTOP 4X10 (GAUZE/BANDAGES/DRESSINGS) IMPLANT
DRSG OPSITE POSTOP 4X6 (GAUZE/BANDAGES/DRESSINGS) IMPLANT
DRSG OPSITE POSTOP 4X8 (GAUZE/BANDAGES/DRESSINGS) IMPLANT
DRSG TEGADERM 2-3/8X2-3/4 SM (GAUZE/BANDAGES/DRESSINGS) ×5 IMPLANT
DRSG TEGADERM 4X4.75 (GAUZE/BANDAGES/DRESSINGS) ×1 IMPLANT
ELECT REM PT RETURN 15FT ADLT (MISCELLANEOUS) ×1 IMPLANT
ENDOLOOP SUT PDS II  0 18 (SUTURE)
ENDOLOOP SUT PDS II 0 18 (SUTURE) IMPLANT
EVACUATOR SILICONE 100CC (DRAIN) ×1 IMPLANT
GAUZE SPONGE 2X2 8PLY STRL LF (GAUZE/BANDAGES/DRESSINGS) ×1 IMPLANT
GAUZE SPONGE 4X4 12PLY STRL (GAUZE/BANDAGES/DRESSINGS) IMPLANT
GLOVE BIO SURGEON STRL SZ7.5 (GLOVE) ×3 IMPLANT
GLOVE INDICATOR 8.0 STRL GRN (GLOVE) ×3 IMPLANT
GOWN SRG XL LVL 4 BRTHBL STRL (GOWNS) ×1 IMPLANT
GOWN STRL NON-REIN XL LVL4 (GOWNS) ×1
GOWN STRL REUS W/ TWL XL LVL3 (GOWN DISPOSABLE) ×5 IMPLANT
GOWN STRL REUS W/TWL XL LVL3 (GOWN DISPOSABLE) ×5
GRASPER SUT TROCAR 14GX15 (MISCELLANEOUS) IMPLANT
HOLDER FOLEY CATH W/STRAP (MISCELLANEOUS) ×1 IMPLANT
IRRIG SUCT STRYKERFLOW 2 WTIP (MISCELLANEOUS) ×1
IRRIGATION SUCT STRKRFLW 2 WTP (MISCELLANEOUS) ×1 IMPLANT
KIT PROCEDURE DA VINCI SI (MISCELLANEOUS)
KIT PROCEDURE DVNC SI (MISCELLANEOUS) IMPLANT
KIT TURNOVER KIT A (KITS) IMPLANT
NEEDLE INSUFFLATION 14GA 120MM (NEEDLE) ×1 IMPLANT
PACK CARDIOVASCULAR III (CUSTOM PROCEDURE TRAY) ×1 IMPLANT
PACK COLON (CUSTOM PROCEDURE TRAY) ×1 IMPLANT
PAD POSITIONING PINK XL (MISCELLANEOUS) ×1 IMPLANT
PENCIL SMOKE EVACUATOR (MISCELLANEOUS) IMPLANT
PROTECTOR NERVE ULNAR (MISCELLANEOUS) ×2 IMPLANT
RELOAD STAPLER 3.5X45 BLU DVNC (STAPLE) IMPLANT
RELOAD STAPLER 3.5X60 BLU DVNC (STAPLE) IMPLANT
RELOAD STAPLER 4.3X45 GRN DVNC (STAPLE) IMPLANT
RELOAD STAPLER 4.3X60 GRN DVNC (STAPLE) IMPLANT
RTRCTR WOUND ALEXIS 18CM MED (MISCELLANEOUS)
SCISSORS LAP 5X35 DISP (ENDOMECHANICALS) IMPLANT
SEAL CANN UNIV 5-8 DVNC XI (MISCELLANEOUS) ×4 IMPLANT
SEAL XI 5MM-8MM UNIVERSAL (MISCELLANEOUS) ×4
SEALER VESSEL DA VINCI XI (MISCELLANEOUS) ×1
SEALER VESSEL EXT DVNC XI (MISCELLANEOUS) ×1 IMPLANT
SLEEVE ADV FIXATION 5X100MM (TROCAR) IMPLANT
SOLUTION ELECTROLUBE (MISCELLANEOUS) ×1 IMPLANT
SPIKE FLUID TRANSFER (MISCELLANEOUS) ×1 IMPLANT
STAPLER CANNULA SEAL DVNC XI (STAPLE) ×1 IMPLANT
STAPLER CANNULA SEAL XI (STAPLE) ×1
STAPLER ECHELON POWER CIR 29 (STAPLE) IMPLANT
STAPLER ECHELON POWER CIR 31 (STAPLE) IMPLANT
STAPLER RELOAD 3.5X45 BLU DVNC (STAPLE)
STAPLER RELOAD 3.5X45 BLUE (STAPLE)
STAPLER RELOAD 3.5X60 BLU DVNC (STAPLE)
STAPLER RELOAD 3.5X60 BLUE (STAPLE)
STAPLER RELOAD 4.3X45 GREEN (STAPLE)
STAPLER RELOAD 4.3X45 GRN DVNC (STAPLE)
STAPLER RELOAD 4.3X60 GREEN (STAPLE)
STAPLER RELOAD 4.3X60 GRN DVNC (STAPLE)
STOPCOCK 4 WAY LG BORE MALE ST (IV SETS) ×2 IMPLANT
SURGILUBE 2OZ TUBE FLIPTOP (MISCELLANEOUS) ×1 IMPLANT
SUT MNCRL AB 4-0 PS2 18 (SUTURE) ×1 IMPLANT
SUT PDS AB 1 CT1 27 (SUTURE) IMPLANT
SUT PDS AB 1 TP1 96 (SUTURE) IMPLANT
SUT PROLENE 0 CT 2 (SUTURE) IMPLANT
SUT PROLENE 2 0 KS (SUTURE) ×1 IMPLANT
SUT PROLENE 2 0 SH DA (SUTURE) IMPLANT
SUT SILK 2 0 (SUTURE)
SUT SILK 2 0 SH CR/8 (SUTURE) IMPLANT
SUT SILK 2-0 18XBRD TIE 12 (SUTURE) IMPLANT
SUT SILK 3 0 (SUTURE) ×1
SUT SILK 3 0 SH CR/8 (SUTURE) ×1 IMPLANT
SUT SILK 3-0 18XBRD TIE 12 (SUTURE) ×1 IMPLANT
SUT V-LOC BARB 180 2/0GR6 GS22 (SUTURE)
SUT VIC AB 3-0 SH 18 (SUTURE) IMPLANT
SUT VIC AB 3-0 SH 27 (SUTURE)
SUT VIC AB 3-0 SH 27XBRD (SUTURE) IMPLANT
SUT VICRYL 0 UR6 27IN ABS (SUTURE) ×1 IMPLANT
SUTURE V-LC BRB 180 2/0GR6GS22 (SUTURE) IMPLANT
SYR 10ML LL (SYRINGE) ×1 IMPLANT
SYS LAPSCP GELPORT 120MM (MISCELLANEOUS)
SYS WOUND ALEXIS 18CM MED (MISCELLANEOUS) ×1
SYSTEM LAPSCP GELPORT 120MM (MISCELLANEOUS) IMPLANT
SYSTEM WOUND ALEXIS 18CM MED (MISCELLANEOUS) ×1 IMPLANT
TAPE UMBILICAL 1/8 X36 TWILL (MISCELLANEOUS) ×1 IMPLANT
TOWEL OR NON WOVEN STRL DISP B (DISPOSABLE) ×1 IMPLANT
TRAY FOLEY MTR SLVR 16FR STAT (SET/KITS/TRAYS/PACK) ×1 IMPLANT
TROCAR ADV FIXATION 5X100MM (TROCAR) ×1 IMPLANT
TUBING CONNECTING 10 (TUBING) ×2 IMPLANT
TUBING INSUFFLATION 10FT LAP (TUBING) ×1 IMPLANT

## 2022-07-27 NOTE — Progress Notes (Signed)
Patients procedure is cancelled today because he needs CTA Chest.    Order placed for CTA to be completed today.

## 2022-07-27 NOTE — Progress Notes (Signed)
Patient d/c home with wife

## 2022-07-27 NOTE — H&P (Signed)
No H&P for today as case has been canceled   He had updated echo with cardiology that was done this month. He has new changes and despite 'clearance' was concerning per our anesthesia team Dr. Glennon Mac. 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 Therefore, his procedure is being postponed  All of this was reviewed with myself and Dr. Glennon Mac with him and his wife today. We are working to get expedited CAT scan/CTA and evaluation with CT surgery   Nadeen Landau, MD [facility], [place]

## 2022-07-27 NOTE — Telephone Encounter (Signed)
Called to review Echo (12/1- resulted 12/6), CT Chest (05/17/22), and CT Aorta 07/27/22.  Reviewed Aortic dimensions 07/27/22 - root 53 mm - ascending 48 mm  Reviewed present guidelines for AA and Aortic regurgitation intervention.  In previous information received from primary, we were told that surgical timing was important; that he had early staged colorectal surgery and that intervention so that he may not need chemotherapy or radiation.  Clarified our 07/18/22; discussion: "though doctors are unable to "clear" someone for surgery, he is asymptomatic, without severe LV dilation or LV dysfunction with moderate aortic regurgitation and an aortic dimension < 55 mm.  If there is time sensitivity of this surgery, repeat CMR in and MRA in 4 months would be reasonable."  The Revised Cardiac Risk Index = 1 DASI score of 43 associated with 8 functional mets  Summary of imaging: Aortic Root 53 mm Ascending Aortic Dimension 48 mm Mild CAC Moderate AI: PHT 487 msec LVIDD 6.2  Will send copy of this message to Dr. Cyndia Bent who sees this patient 07/30/22.  If LHC/RHC needs (if planned for aortic surgery)  Risks and benefits of cardiac catheterization have been discussed with the patient.  These include bleeding, infection, kidney damage, stroke, heart attack, death.  The patient understands these risks and is willing to proceed.  Access recommendations: R radial R AC Procedural considerations no plans for intervention  Rudean Haskell, MD [place]  [address] [place], [place] [address] [phone]  7:01 PM

## 2022-07-30 ENCOUNTER — Encounter: Payer: Self-pay | Admitting: Surgery

## 2022-07-30 ENCOUNTER — Institutional Professional Consult (permissible substitution) (INDEPENDENT_AMBULATORY_CARE_PROVIDER_SITE_OTHER): Payer: BLUE CROSS/BLUE SHIELD | Admitting: Surgery

## 2022-07-30 VITALS — BP 166/99 | HR 65 | Resp 20 | Ht 75.0 in | Wt 277.0 lb

## 2022-07-30 DIAGNOSIS — I7121 Aneurysm of the ascending aorta, without rupture: Secondary | ICD-10-CM | POA: Diagnosis not present

## 2022-07-30 DIAGNOSIS — I351 Nonrheumatic aortic (valve) insufficiency: Secondary | ICD-10-CM | POA: Diagnosis not present

## 2022-07-30 NOTE — Progress Notes (Signed)
Cardiothoracic Surgery Consultation  PCP is [name], MD Referring Provider is Rudean Haskell A*  Chief Complaint  Patient presents with   Thoracic Aortic Aneurysm    ECHO 12/1, CTA 12/15    HPI:  The patient is a 51 year old gentleman with history of type 2 diabetes, dyslipidemia, and hypertension who was recently diagnosed with rectal cancer and seen by Dr. Dema Severin for surgical treatment.  He was noted to have some coronary calcification on CT scanning of the chest, abdomen, and pelvis on 05/17/2022.  He was seen by Dr. Gasper Sells for cardiology evaluation and had a 2D echocardiogram on 07/13/2022.  This showed the aortic valve to have normal structure with moderate aortic insufficiency with a pressure half-time of 487 ms.  Left ventricular ejection fraction was 60 to 65% with grade 2 diastolic dysfunction.  The LV diastolic diameter was 6.2 cm with a systolic diameter of 3.7 cm.  The aortic root was measured at 5.2 cm and the ascending aorta was measured at 5 cm.  Dr. Gasper Sells cleared him for surgery and he was scheduled for cancer surgery on 07/23/2022.  He was seen in PAT preoperatively by the nurse and came in for surgery on the morning of 07/23/2022.  Anesthesia was concerned about proceeding with surgery due to the aneurysm which was felt to be increasing in size and aortic insufficiency and the surgery was canceled.  Anesthesia discussed it with Dr. Kipp Brood who recommended getting a CTA of the chest and cardiac surgery follow-up.  The patient is here today with his wife.  She and her husband are both concerned and upset that this was not evaluated preoperatively by anesthesia before he showed up in preop holding since it took considerable effort on their part to arrange time off from work, childcare, short-term disability, etc and he was cleared by cardiology preop.  He denies any chest pain or pressure.  He denies any exertional dyspnea or fatigue.  He works lifting  heavy boxes daily without any symptoms.  He denies any orthopnea or PND.  He has had no peripheral edema.  He had a sleep study that ruled out sleep apnea.  There is no family history of aortic aneurysm, aortic dissection, or connective tissue disorder.   Past Medical History:  Diagnosis Date   Anemia    Arthralgia 01/11/2013   Diffuse, b/l Hips R>L Knees, ankles, low back   Arthritis    Back pain 12/08/2015   Cancer ([place])    Coronary artery disease    Diabetes mellitus type 2 in obese ([place]) 11/09/2012   Dyslipidemia 11/09/2012   Esophageal reflux 12/08/2015   History of migraine headaches    Hyperglycemia 11/09/2012   Hypertension     Past Surgical History:  Procedure Laterality Date   EUS N/A 06/21/2022   Procedure: LOWER ENDOSCOPIC ULTRASOUND (EUS);  Surgeon: Irving Copas., MD;  Location: [facility];  Service: Gastroenterology;  Laterality: N/A;   FLEXIBLE SIGMOIDOSCOPY N/A 06/21/2022   Procedure: FLEXIBLE SIGMOIDOSCOPY;  Surgeon: Rush Landmark Telford Nab., MD;  Location: [facility];  Service: Gastroenterology;  Laterality: N/A;    Family History  Problem Relation Age of Onset   Arthritis Mother    Hypertension Mother    Diabetes Mother    Cancer Mother    Arthritis Father    Hypertension Father    Diabetes Father    Sleep apnea Father    Obesity Father    Arthritis Maternal Grandmother    Diabetes Maternal Grandmother  Stroke Maternal Grandmother    Arthritis Paternal Grandmother    Diabetes Paternal Grandmother    Cancer Paternal Grandmother        lung, smoker   Diabetes Paternal Grandfather    Heart disease Paternal Grandfather 18       MI   Hyperlipidemia Brother    Hypertension Brother    Diabetes Brother    COPD Maternal Grandfather    Kidney disease Neg Hx     Social History Social History   Tobacco Use   Smoking status: Former    Packs/day: 1.50    Years: 10.00    Total pack years: 15.00    Types: Cigarettes    Quit date:  08/14/1995    Years since quitting: 26.9   Smokeless tobacco: Never  Vaping Use   Vaping Use: Never used  Substance Use Topics   Alcohol use: Yes    Alcohol/week: 6.0 - 10.0 standard drinks of alcohol    Types: 6 - 10 Cans of beer per week    Comment: 2/day, more in the past   Drug use: Yes    Types: Marijuana    Comment: marijuana 12/11    Current Outpatient Medications  Medication Sig Dispense Refill   amLODipine (NORVASC) 5 MG tablet TAKE 1 TABLET (5 MG TOTAL) BY MOUTH DAILY. (Patient taking differently: Take 5 mg by mouth daily.) 90 tablet 1   carvedilol (COREG) 25 MG tablet Take 1 tablet (25 mg total) by mouth 2 (two) times daily. 180 tablet 3   esomeprazole (NEXIUM) 20 MG capsule Take 20 mg by mouth daily.     glucose blood (COOL BLOOD GLUCOSE TEST STRIPS) test strip Check blood sugar once daily 100 each 12   Iron, Ferrous Sulfate, 325 (65 Fe) MG TABS Take 325 mg by mouth 2 (two) times daily with a meal. 30 tablet 2   losartan (COZAAR) 100 MG tablet TAKE 1 TABLET BY MOUTH EVERY DAY (Patient taking differently: Take 100 mg by mouth daily.) 90 tablet 1   metFORMIN (GLUCOPHAGE) 500 MG tablet Take 1 tablet (500 mg total) by mouth 2 (two) times daily. (Patient taking differently: Take 500 mg by mouth 2 (two) times daily. '1000mg'$  in am, '500mg'$  pm) 180 tablet 1   Multiple Vitamins-Minerals (MULTIVITAMIN WITH MINERALS) tablet Take 1 tablet by mouth daily.     tiZANidine (ZANAFLEX) 2 MG tablet Take 0.5-2 tablets (1-4 mg total) by mouth 2 (two) times daily as needed for muscle spasms. (Patient taking differently: Take 2-4 mg by mouth 3 (three) times daily as needed for muscle spasms.) 30 tablet 5   No current facility-administered medications for this visit.    Allergies  Allergen Reactions   Mucinex [Guaifenesin Er] Shortness Of Breath, Itching, Swelling, Dermatitis and Rash    Swelling of hands and feet   Nsaids Shortness Of Breath, Itching, Swelling and Rash    Aleve and Advil    Latex  Rash and Other (See Comments)    Cartilage will harden     Review of Systems  Constitutional:  Negative for activity change and fatigue.  HENT: Negative.    Eyes: Negative.   Respiratory:  Negative for chest tightness and shortness of breath.   Cardiovascular:  Negative for chest pain and leg swelling.  Gastrointestinal:  Positive for blood in stool.       Reflux  Endocrine: Negative.   Genitourinary: Negative.   Musculoskeletal:  Positive for arthralgias.  Allergic/Immunologic: Negative.   Neurological:  Negative for  dizziness and syncope.  Hematological: Negative.   Psychiatric/Behavioral: Negative.      BP (!) 166/99 (BP Location: Right Arm, Patient Position: Sitting)   Pulse 65   Resp 20   Ht '6\' 3"'$  (1.905 m)   Wt 277 lb (125.6 kg)   SpO2 94% Comment: RA  BMI 34.62 kg/m  Physical Exam Constitutional:      Appearance: Normal appearance. He is obese.  HENT:     Head: Normocephalic and atraumatic.  Eyes:     Extraocular Movements: Extraocular movements intact.     Conjunctiva/sclera: Conjunctivae normal.     Pupils: Pupils are equal, round, and reactive to light.  Neck:     Vascular: No carotid bruit.  Cardiovascular:     Rate and Rhythm: Normal rate and regular rhythm.     Pulses: Normal pulses.     Heart sounds: Normal heart sounds. No murmur heard. Pulmonary:     Effort: Pulmonary effort is normal.     Breath sounds: Normal breath sounds.  Musculoskeletal:        General: No swelling.  Lymphadenopathy:     Cervical: No cervical adenopathy.  Skin:    General: Skin is warm and dry.  Neurological:     General: No focal deficit present.     Mental Status: He is alert and oriented to person, place, and time.  Psychiatric:        Mood and Affect: Mood normal.        Behavior: Behavior normal.      Diagnostic Tests:   ECHOCARDIOGRAM REPORT       Patient Name:   Robert [name]  Date of Exam: 07/13/2022 Medical Rec #:  [id number]     Height:       75.0  in Accession #:    [id number]    Weight:       280.0 lb Date of Birth:  1970-10-18     BSA:          2.532 m Patient Age:    51 years      BP:           140/90 mmHg Patient Gender: M             HR:           68 bpm. Exam Location:  [facility]  Procedure: 2D Echo, Cardiac Doppler and Color Doppler  Indications:    I51.7 LVH   History:        Patient has no prior history of Echocardiogram examinations.                 LVH; Risk Factors:Hypertension, Dyslipidemia and Morbid obesity.   Sonographer:    Coralyn Helling RDCS Referring Phys: [id number] [facility] A CHANDRASEKHAR  IMPRESSIONS    1. Left ventricular ejection fraction, by estimation, is 60 to 65%. The left ventricle has normal function. The left ventricle has no regional wall motion abnormalities. The left ventricular internal cavity size was mildly dilated. There is severe left ventricular hypertrophy. Left ventricular diastolic parameters are consistent with Grade II diastolic dysfunction (pseudonormalization).  2. Right ventricular systolic function is normal. The right ventricular size is mildly enlarged.  3. Left atrial size was moderately dilated.  4. The mitral valve is normal in structure. No evidence of mitral valve regurgitation. No evidence of mitral stenosis.  5. The aortic valve is normal in structure. Aortic valve regurgitation is moderate. No aortic stenosis is present. Aortic regurgitation PHT measures 487  msec. Aortic valve area, by VTI measures 3.18 cm. Aortic valve mean gradient measures 8.5 mmHg. Aortic valve Vmax measures 2.09 m/s.  6. Aortic dilatation noted. There is severe dilatation of the aortic root, measuring 52 mm. There is severe dilatation of the ascending aorta, measuring 50 mm.  7. The inferior vena cava is dilated in size with <50% respiratory variability, suggesting right atrial pressure of 15 mmHg.  FINDINGS  Left Ventricle: Left ventricular ejection fraction, by estimation, is  60 to 65%. The left ventricle has normal function. The left ventricle has no regional wall motion abnormalities. The left ventricular internal cavity size was mildly dilated. There is  severe left ventricular hypertrophy. Left ventricular diastolic parameters are consistent with Grade II diastolic dysfunction (pseudonormalization).  Right Ventricle: The right ventricular size is mildly enlarged. No increase in right ventricular wall thickness. Right ventricular systolic function is normal.  Left Atrium: Left atrial size was moderately dilated.  Right Atrium: Right atrial size was normal in size.  Pericardium: Trivial pericardial effusion is present. The pericardial effusion is posterior to the left ventricle.  Mitral Valve: The mitral valve is normal in structure. No evidence of mitral valve regurgitation. No evidence of mitral valve stenosis.  Tricuspid Valve: The tricuspid valve is normal in structure. Tricuspid valve regurgitation is not demonstrated. No evidence of tricuspid stenosis.  Aortic Valve: The aortic valve is normal in structure. Aortic valve regurgitation is moderate. Aortic regurgitation PHT measures 487 msec. No aortic stenosis is present. Aortic valve mean gradient measures 8.5 mmHg. Aortic valve peak gradient measures 17.5 mmHg. Aortic valve area, by VTI measures 3.18 cm.  Pulmonic Valve: The pulmonic valve was normal in structure. Pulmonic valve regurgitation is mild. No evidence of pulmonic stenosis.  Aorta: Aortic dilatation noted. There is severe dilatation of the aortic root, measuring 52 mm. There is severe dilatation of the ascending aorta, measuring 50 mm.  Venous: The inferior vena cava is dilated in size with less than 50% respiratory variability, suggesting right atrial pressure of 15 mmHg.  IAS/Shunts: No atrial level shunt detected by color flow Doppler.    LEFT VENTRICLE PLAX 2D LVIDd:         6.20 cm   Diastology LVIDs:         3.70  cm   LV e' medial:    6.20 cm/s LV PW:         1.40 cm   LV E/e' medial:  18.2 LV IVS:        1.70 cm   LV e' lateral:   8.59 cm/s LVOT diam:     2.60 cm   LV E/e' lateral: 13.2 LV SV:         134 LV SV Index:   53 LVOT Area:     5.31 cm    RIGHT VENTRICLE             IVC RV S prime:     22.40 cm/s  IVC diam: 2.10 cm TAPSE (M-mode): 2.8 cm  LEFT ATRIUM              Index        RIGHT ATRIUM           Index LA diam:        4.60 cm  1.82 cm/m   RA Pressure: 3.00 mmHg LA Vol (A2C):   108.0 ml 42.66 ml/m  RA Area:     19.50 cm LA Vol (A4C):   118.0 ml 46.61 ml/m  RA Volume:   58.10 ml  22.95 ml/m LA Biplane Vol: 114.0 ml 45.03 ml/m  AORTIC VALVE AV Area (Vmax):    3.28 cm AV Area (Vmean):   3.30 cm AV Area (VTI):     3.18 cm AV Vmax:           209.00 cm/s AV Vmean:          133.000 cm/s AV VTI:            0.421 m AV Peak Grad:      17.5 mmHg AV Mean Grad:      8.5 mmHg LVOT Vmax:         129.00 cm/s LVOT Vmean:        82.700 cm/s LVOT VTI:          0.252 m LVOT/AV VTI ratio: 0.60 AI PHT:            487 msec   AORTA Ao Root diam: 5.20 cm Ao Asc diam:  5.00 cm  MITRAL VALVE                TRICUSPID VALVE MV Area (PHT): 2.92 cm     Estimated RAP:  3.00 mmHg MV Decel Time: 260 msec MV E velocity: 113.00 cm/s  SHUNTS MV A velocity: 96.90 cm/s   Systemic VTI:  0.25 m MV E/A ratio:  1.17         Systemic Diam: 2.60 cm  Candee Furbish MD Electronically signed by Candee Furbish MD Signature Date/Time: 07/13/2022/2:15:15 PM       Final     Narrative & Impression  CLINICAL DATA:  Thoracic aorta disease, pre-op planning preop AVR. History of colon cancer. * Tracking Code: BO *   EXAM: CT ANGIOGRAPHY CHEST WITH CONTRAST   TECHNIQUE: Multidetector CT imaging of the chest was performed using the standard protocol during bolus administration of intravenous contrast. Multiplanar CT image reconstructions and MIPs were obtained to evaluate the vascular anatomy.    RADIATION DOSE REDUCTION: This exam was performed according to the departmental dose-optimization program which includes automated exposure control, adjustment of the mA and/or kV according to patient size and/or use of iterative reconstruction technique.   CONTRAST:  182m OMNIPAQUE IOHEXOL 350 MG/ML SOLN   COMPARISON:  05/17/2022 CT chest, abdomen and pelvis.   FINDINGS: Cardiovascular: Mild cardiomegaly. No significant pericardial effusion/thickening. Left anterior descending and left circumflex coronary atherosclerosis. Mildly tortuous thoracic aorta. No evidence of acute aortic syndrome. Dilated aortic root measuring 5.3 cm diameter at the level of the sinuses of Valsalva. Ascending thoracic aortic aneurysm measuring 4.8 cm diameter. Normal caliber aortic isthmus (2.8 cm diameter), descending thoracic aorta (3.0 cm diameter) and aorta at the diaphragmatic hiatus (2.8 cm diameter). Dilated main pulmonary artery (3.6 cm diameter). No central pulmonary emboli.   Mediastinum/Nodes: No significant thyroid nodules. Unremarkable esophagus. No pathologically enlarged axillary, mediastinal or hilar lymph nodes.   Lungs/Pleura: No pneumothorax. No pleural effusion. No acute consolidative airspace disease or lung masses. Solid peripheral right middle lobe 0.4 cm pulmonary nodule (series 10/image 71), stable. No new significant pulmonary nodules.   Upper abdomen: No acute abnormality.   Musculoskeletal: No aggressive appearing focal osseous lesions. Moderate thoracic spondylosis.   Review of the MIP images confirms the above findings.   IMPRESSION: 1. Dilated aortic root measuring 5.3 cm diameter at the level of the sinuses of Valsalva. Ascending thoracic aortic aneurysm measuring 4.8 cm diameter. Ascending thoracic aortic aneurysm. Recommend semi-annual imaging followup by CTA or  MRA and referral to cardiothoracic surgery if not already obtained. This recommendation follows  2010 ACCF/AHA/AATS/ACR/ASA/SCA/SCAI/SIR/STS/SVM Guidelines for the Diagnosis and Management of Patients With Thoracic Aortic Disease. Circulation. 2010; 121: K938-H829. Aortic aneurysm NOS (ICD10-I71.9). 2. Mild cardiomegaly. Two-vessel coronary atherosclerosis. 3. Dilated main pulmonary artery, suggesting pulmonary arterial hypertension. 4. Stable 0.4 cm right middle lobe pulmonary nodule since recent 05/17/2022 chest CT. Suggest attention on follow-up chest CT in 6 months given history of malignancy.     Electronically Signed   By: Ilona Sorrel M.D.   On: 07/27/2022 14:00       Impression:  This 51 year old gentleman has a 5.3 cm aortic root and 4.8 cm ascending aortic aneurysm extending up to the origin of the innominate artery.  He is a '6\' 3"'$  tall gentleman so these measurements are not quite as significant as if he were a shorter small frame person. I have personally reviewed his 2D echocardiogram, CT scan of the chest, abdomen, and pelvis from October 2023 and his current CTA of the chest from 07/27/2022.  I do not think the aorta has changed compared to his CT scan of the chest, abdomen, and pelvis in October 2023.  The radiologist just did not comment on the aortic root at that time.  In general the threshold for surgery is 5.5 cm.  He has a trileaflet aortic valve with moderate asymptomatic aortic insufficiency, normal left ventricular systolic function and an LV diastolic diameter of 6.2 cm with a systolic diameter 3.7 cm.  There is no indication for replacement of his aortic valve and aortic aneurysm at this time.  The risk of aortic dissection is less than 0.5% and I stressed the importance of continued good blood pressure control in preventing further enlargement and acute aortic dissection.  I do not think there is any contraindication to proceeding with his cancer surgery which is a priority at this time.  He will require a CTA of the chest/aorta with gating and a 2D  echocardiogram in 6 months and I will arrange those studies with follow-up with me at that time.  I reviewed all of the CT scan and echo images with the patient and his wife and answered their questions.   Plan:  He will proceed with his cancer surgery and I will see him back in 6 months with a CT scan of the chest/aorta with gating and a 2D echocardiogram.  I spent 60 minutes performing this consultation and > 50% of this time was spent face to face counseling and coordinating the care of this patient's aortic root and ascending aortic aneurysm with moderate aortic insufficiency.   Gaye Pollack, MD [facility] [phone]

## 2022-07-31 ENCOUNTER — Encounter: Payer: Self-pay | Admitting: Nurse Practitioner

## 2022-08-01 NOTE — Progress Notes (Signed)
Sent message, via epic in basket, requesting orders in epic from surgeon.  

## 2022-08-03 ENCOUNTER — Encounter: Payer: BLUE CROSS/BLUE SHIELD | Admitting: Thoracic Surgery (Cardiothoracic Vascular Surgery)

## 2022-08-08 ENCOUNTER — Encounter (HOSPITAL_COMMUNITY)
Admission: RE | Admit: 2022-08-08 | Discharge: 2022-08-08 | Disposition: A | Discharge status: Home / Self Care | Payer: BLUE CROSS/BLUE SHIELD | Source: Ambulatory Visit | Attending: Family Medicine | Admitting: Family Medicine

## 2022-08-09 NOTE — Patient Instructions (Addendum)
DUE TO COVID-19 ONLY TWO VISITORS  (aged 53 and older)  ARE ALLOWED TO COME WITH YOU AND STAY IN THE WAITING ROOM ONLY DURING PRE OP AND PROCEDURE.   **NO VISITORS ARE ALLOWED IN THE SHORT STAY AREA OR RECOVERY ROOM!!**  IF YOU WILL BE ADMITTED INTO THE HOSPITAL YOU ARE ALLOWED ONLY FOUR SUPPORT PEOPLE DURING VISITATION HOURS ONLY (7 AM -8PM)   The support person(s) must pass our screening, gel in and out, and wear a mask at all times, including in the patient's room. Patients must also wear a mask when staff or their support person are in the room. Visitors GUEST BADGE MUST BE WORN VISIBLY  One adult visitor may remain with you overnight and MUST be in the room by 8 P.M.     Your procedure is scheduled on: 08/23/22   Report to [facility] Main Entrance    Report to admitting at : 5:15 AM   Call this number if you have problems the morning of surgery [phone] .   Clear liquids starting the day before surgery until : 4:30 AM DAY OF SURGERY. Drink plenty clear liquids the day before surgery.  Water Black Coffee (sugar ok, NO MILK/CREAM OR CREAMERS)  Tea (sugar ok, NO MILK/CREAM OR CREAMERS) regular and decaf                             Plain Jell-O (NO RED)                                           Fruit ices (not with fruit pulp, NO RED)                                     Popsicles (NO RED)                                                                  Juice: apple, WHITE grape, WHITE cranberry Sports drinks like Gatorade (NO RED)  DRINK 2 PRESURGERY ENSURE DRINKS THE NIGHT BEFORE SURGERY AT  1000 PM AND 1 PRESURGERY DRINK THE DAY OF THE PROCEDURE 3 HOURS PRIOR TO SCHEDULED SURGERY. NO SOLIDS AFTER MIDNIGHT THE DAY PRIOR TO THE SURGERY. NOTHING BY MOUTH EXCEPT CLEAR LIQUIDS UNTIL THREE HOURS PRIOR TO SCHEDULED SURGERY. PLEASE FINISH PRESURGERY ENSURE DRINK PER SURGEON ORDER 3 HOURS PRIOR TO SCHEDULED SURGERY TIME WHICH NEEDS TO BE COMPLETED AT 4:30 AM.   FOLLOW BOWEL  PREP AND ANY ADDITIONAL PRE OP INSTRUCTIONS YOU RECEIVED FROM YOUR SURGEON'S OFFICE!!!   Oral Hygiene is also important to reduce your risk of infection.                                    Remember - BRUSH YOUR TEETH THE MORNING OF SURGERY WITH YOUR REGULAR TOOTHPASTE  DENTURES WILL BE REMOVED PRIOR TO SURGERY PLEASE DO NOT APPLY "Poly grip" OR ADHESIVES!!!   Do NOT smoke after Midnight   Take  these medicines the morning of surgery with A SIP OF WATER: carvedilol,amlodipine,esomeprazole.  How to Manage Your Diabetes Before and After Surgery  Why is it important to control my blood sugar before and after surgery? Improving blood sugar levels before and after surgery helps healing and can limit problems. A way of improving blood sugar control is eating a healthy diet by:  Eating less sugar and carbohydrates  Increasing activity/exercise  Talking with your doctor about reaching your blood sugar goals High blood sugars (greater than 180 mg/dL) can raise your risk of infections and slow your recovery, so you will need to focus on controlling your diabetes during the weeks before surgery. Make sure that the doctor who takes care of your diabetes knows about your planned surgery including the date and location.  How do I manage my blood sugar before surgery? Check your blood sugar at least 4 times a day, starting 2 days before surgery, to make sure that the level is not too high or low. Check your blood sugar the morning of your surgery when you wake up and every 2 hours until you get to the Short Stay unit. If your blood sugar is less than 70 mg/dL, you will need to treat for low blood sugar: Do not take insulin. Treat a low blood sugar (less than 70 mg/dL) with  cup of clear juice (cranberry or apple), 4 glucose tablets, OR glucose gel. Recheck blood sugar in 15 minutes after treatment (to make sure it is greater than 70 mg/dL). If your blood sugar is not greater than 70 mg/dL on recheck,  call [phone] for further instructions. Report your blood sugar to the short stay nurse when you get to Short Stay.  If you are admitted to the hospital after surgery: Your blood sugar will be checked by the staff and you will probably be given insulin after surgery (instead of oral diabetes medicines) to make sure you have good blood sugar levels. The goal for blood sugar control after surgery is 80-180 mg/dL.   WHAT DO I DO ABOUT MY DIABETES MEDICATION?  Do not take oral diabetes medicines (pills) the morning of surgery.  THE DAY BEFORE SURGERY, take metformin as usual.      THE MORNING OF SURGERY,DO NOT TAKE ANY ORAL DIABETIC MEDICATIONS DAY OF YOUR SURGERY                              You may not have any metal on your body including hair pins, jewelry, and body piercing             Do not wear lotions, powders, perfumes/cologne, or deodorant              Men may shave face and neck.   Do not bring valuables to the hospital. [place].   Contacts, glasses, or bridgework may not be worn into surgery.   Bring small overnight bag day of surgery.   DO NOT [place]. PHARMACY WILL DISPENSE MEDICATIONS LISTED ON YOUR MEDICATION LIST TO YOU DURING YOUR ADMISSION [place]!    Patients discharged on the day of surgery will not be allowed to drive home.  Someone NEEDS to stay with you for the first 24 hours after anesthesia.   Special Instructions: Bring a copy of your  healthcare power of attorney and living will documents         the day of surgery if you haven't scanned them before.              Please read over the following fact sheets you were given: IF YOU HAVE QUESTIONS ABOUT YOUR PRE-OP INSTRUCTIONS PLEASE CALL [phone]    [facility] - Preparing for Surgery Before surgery, you can play an important role.  Because skin is not sterile, your skin needs to be as free of germs as  possible.  You can reduce the number of germs on your skin by washing with CHG (chlorahexidine gluconate) soap before surgery.  CHG is an antiseptic cleaner which kills germs and bonds with the skin to continue killing germs even after washing. Please DO NOT use if you have an allergy to CHG or antibacterial soaps.  If your skin becomes reddened/irritated stop using the CHG and inform your nurse when you arrive at Short Stay. Do not shave (including legs and underarms) for at least 48 hours prior to the first CHG shower.  You may shave your face/neck. Please follow these instructions carefully:  1.  Shower with CHG Soap the night before surgery and the  morning of Surgery.  2.  If you choose to wash your hair, wash your hair first as usual with your  normal  shampoo.  3.  After you shampoo, rinse your hair and body thoroughly to remove the  shampoo.                           4.  Use CHG as you would any other liquid soap.  You can apply chg directly  to the skin and wash                       Gently with a scrungie or clean washcloth.  5.  Apply the CHG Soap to your body ONLY FROM THE NECK DOWN.   Do not use on face/ open                           Wound or open sores. Avoid contact with eyes, ears mouth and genitals (private parts).                       Wash face,  Genitals (private parts) with your normal soap.             6.  Wash thoroughly, paying special attention to the area where your surgery  will be performed.  7.  Thoroughly rinse your body with warm water from the neck down.  8.  DO NOT shower/wash with your normal soap after using and rinsing off  the CHG Soap.                9.  Pat yourself dry with a clean towel.            10.  Wear clean pajamas.            11.  Place clean sheets on your bed the night of your first shower and do not  sleep with pets. Day of Surgery : Do not apply any lotions/deodorants the morning of surgery.  Please wear clean clothes to the hospital/surgery  center.  FAILURE TO FOLLOW THESE INSTRUCTIONS MAY RESULT IN THE CANCELLATION OF YOUR SURGERY PATIENT  SIGNATURE_________________________________  NURSE SIGNATURE__________________________________  ________________________________________________________________________

## 2022-08-10 ENCOUNTER — Other Ambulatory Visit: Payer: Self-pay

## 2022-08-10 ENCOUNTER — Encounter (HOSPITAL_COMMUNITY): Payer: Self-pay

## 2022-08-10 ENCOUNTER — Encounter (HOSPITAL_COMMUNITY)
Admission: RE | Admit: 2022-08-10 | Discharge: 2022-08-10 | Disposition: A | Discharge status: Home / Self Care | Payer: BLUE CROSS/BLUE SHIELD | Source: Ambulatory Visit | Attending: Surgery | Admitting: Surgery

## 2022-08-10 VITALS — BP 158/99 | HR 67 | Temp 98.7°F | Ht 75.0 in | Wt 278.2 lb

## 2022-08-10 DIAGNOSIS — Z01812 Encounter for preprocedural laboratory examination: Secondary | ICD-10-CM | POA: Insufficient documentation

## 2022-08-10 DIAGNOSIS — E669 Obesity, unspecified: Secondary | ICD-10-CM | POA: Diagnosis not present

## 2022-08-10 DIAGNOSIS — I10 Essential (primary) hypertension: Secondary | ICD-10-CM

## 2022-08-10 DIAGNOSIS — E1169 Type 2 diabetes mellitus with other specified complication: Secondary | ICD-10-CM | POA: Diagnosis not present

## 2022-08-10 DIAGNOSIS — Z01818 Encounter for other preprocedural examination: Secondary | ICD-10-CM

## 2022-08-10 LAB — TYPE AND SCREEN
ABO/RH(D): A POS
Antibody Screen: NEGATIVE

## 2022-08-10 LAB — CBC
HCT: 37.7 % — ABNORMAL LOW (ref 39.0–52.0)
Hemoglobin: 11.6 g/dL — ABNORMAL LOW (ref 13.0–17.0)
MCH: 24.8 pg — ABNORMAL LOW (ref 26.0–34.0)
MCHC: 30.8 g/dL (ref 30.0–36.0)
MCV: 80.7 fL (ref 80.0–100.0)
Platelets: 253 10*3/uL (ref 150–400)
RBC: 4.67 MIL/uL (ref 4.22–5.81)
RDW: 14.1 % (ref 11.5–15.5)
WBC: 7.6 10*3/uL (ref 4.0–10.5)
nRBC: 0 % (ref 0.0–0.2)

## 2022-08-10 LAB — COMPREHENSIVE METABOLIC PANEL
ALT: 22 U/L (ref 0–44)
AST: 23 U/L (ref 15–41)
Albumin: 4 g/dL (ref 3.5–5.0)
Alkaline Phosphatase: 63 U/L (ref 38–126)
Anion gap: 5 (ref 5–15)
BUN: 14 mg/dL (ref 6–20)
CO2: 26 mmol/L (ref 22–32)
Calcium: 9.4 mg/dL (ref 8.9–10.3)
Chloride: 107 mmol/L (ref 98–111)
Creatinine, Ser: 1.15 mg/dL (ref 0.61–1.24)
GFR, Estimated: >60 mL/min (ref >60)
Glucose, Bld: 195 mg/dL — ABNORMAL HIGH (ref 70–99)
Potassium: 4.3 mmol/L (ref 3.5–5.1)
Sodium: 138 mmol/L (ref 135–145)
Total Bilirubin: 0.5 mg/dL (ref 0.3–1.2)
Total Protein: 7 g/dL (ref 6.5–8.1)

## 2022-08-10 LAB — GLUCOSE, CAPILLARY: Glucose-Capillary: 212 mg/dL — ABNORMAL HIGH (ref 70–99)

## 2022-08-10 NOTE — Progress Notes (Signed)
For Short Stay: [place] appointment date:  Bowel Prep reminder:   For Anesthesia: PCP - Dr. Penni Homans Cardiologist - Dr. Rudean Haskell. LOV: 06/19/22 Clearance Dr. Gaye Pollack: 07/30/22 Chest x-ray -  EKG - 06/19/22 Stress Test -  ECHO - 07/13/22 Cardiac Cath -  Pacemaker/ICD device last checked: Pacemaker orders received: Device Rep notified:  Spinal Cord Stimulator:  Sleep Study - Yes CPAP - NO  Fasting Blood Sugar - 130's Checks Blood Sugar ___2__ times a day Date and result of last Hgb A1c-7.9: 07/20/22  Last dose of GLP1 agonist-  GLP1 instructions:   Last dose of SGLT-2 inhibitors-  SGLT-2 instructions:   Blood Thinner Instructions: Aspirin Instructions: Last Dose:  Activity level: Can go up a flight of stairs and activities of daily living without stopping and without chest pain and/or shortness of breath   Able to exercise without chest pain and/or shortness of breath   Unable to go up a flight of stairs without chest pain and/or shortness of breath     Anesthesia review: Hx: HTN,DIA,CAD  Patient denies shortness of breath, fever, cough and chest pain at PAT appointment   Patient verbalized understanding of instructions that were given to them at the PAT appointment. Patient was also instructed that they will need to review over the PAT instructions again at home before surgery.

## 2022-08-10 NOTE — Consult Note (Addendum)
[place] Nurse requested for preoperative stoma site marking.  This was previously performed on 12/8 by a member of the [place] team but mark faded and is no longer present.  Mark placed in the same location.    Examined patient lsitting, and standing in order to place the marking in the patient's visual field, away from any creases or abdominal contour issues and within the rectus muscle.  Attempted to mark below the patient's belt line.    Marked for colostomy in the LLQ  _7___ cm to the left of the umbilicus and __4__BE below the umbilicus.   Marked for ileostomy in the RLQ  __7__cm to the right of the umbilicus and  __0__ cm below the umbilicus.   Patient's abdomen cleansed with CHG wipes at site markings, allowed to air dry prior to marking.Covered mark with thin film transparent dressing to preserve mark until date of surgery. Pt states he has a marking pen at home and will re-color the marks in if they begin to fade prior to surgery.   [place] Nurse team will follow up with patient after surgery for continued ostomy care and teaching if he receives and ostomy.  Please re-consult if further assistance is needed.  Thank-you,  Julien Girt MSN, [place], [place], [place], [place]

## 2022-08-12 ENCOUNTER — Other Ambulatory Visit: Payer: Self-pay | Admitting: Family Medicine

## 2022-08-12 DIAGNOSIS — I10 Essential (primary) hypertension: Secondary | ICD-10-CM

## 2022-08-12 DIAGNOSIS — E669 Obesity, unspecified: Secondary | ICD-10-CM

## 2022-08-12 DIAGNOSIS — Z0000 Encounter for general adult medical examination without abnormal findings: Secondary | ICD-10-CM

## 2022-08-12 DIAGNOSIS — E1169 Type 2 diabetes mellitus with other specified complication: Secondary | ICD-10-CM

## 2022-08-12 DIAGNOSIS — E785 Hyperlipidemia, unspecified: Secondary | ICD-10-CM

## 2022-08-12 DIAGNOSIS — I152 Hypertension secondary to endocrine disorders: Secondary | ICD-10-CM

## 2022-08-16 NOTE — Anesthesia Preprocedure Evaluation (Addendum)
Anesthesia Evaluation  Patient identified by MRN, date of birth, ID band Patient awake    Reviewed: Allergy & Precautions, H&P , NPO status , Patient's Chart, lab work & pertinent test results  Airway Mallampati: II   Neck ROM: full    Dental   Pulmonary former smoker   breath sounds clear to auscultation       Cardiovascular hypertension, + CAD  + Valvular Problems/Murmurs AI  Rhythm:regular Rate:Normal  Cleared by cardiology as very low risk for adverse cardiac events.  EF 60%, moderate AI   Neuro/Psych    GI/Hepatic ,GERD  ,,Rectal CA   Endo/Other  diabetes, Type 2    Renal/GU      Musculoskeletal  (+) Arthritis ,    Abdominal   Peds  Hematology   Anesthesia Other Findings   Reproductive/Obstetrics                             Anesthesia Physical Anesthesia Plan  ASA: 3  Anesthesia Plan: General   Post-op Pain Management:    Induction: Intravenous  PONV Risk Score and Plan: 2 and Ondansetron, Dexamethasone, Midazolam and Treatment may vary due to age or medical condition  Airway Management Planned: Oral ETT  Additional Equipment:   Intra-op Plan:   Post-operative Plan: Extubation in OR  Informed Consent: I have reviewed the patients History and Physical, chart, labs and discussed the procedure including the risks, benefits and alternatives for the proposed anesthesia with the patient or authorized representative who has indicated his/her understanding and acceptance.     Dental advisory given  Plan Discussed with: CRNA, Anesthesiologist and Surgeon  Anesthesia Plan Comments: (See PAT note 08/16/2022)       Anesthesia Quick Evaluation

## 2022-08-16 NOTE — Progress Notes (Signed)
Anesthesia Chart Review   Case: [id number] Date/Time: 08/23/22 0715   Procedures:      XI ROBOTIC ASSISTED LOWER ANTERIOR RESECTION     FLEXIBLE SIGMOIDOSCOPY     POSSIBLE DIVERTING LOOP ILEOSTOMY   Anesthesia type: General   Pre-op diagnosis: rectal cancer   Location: [facility] / [facility]   Surgeons: Ileana Roup, MD       DISCUSSION:52 y.o.  former smoker with h/o HTN, DM II, CAD, moderate aortic valve regurgitation, rectal cancer scheduled for above procedure 08/23/22 with Dr .Harrell Gave Lovena Neighbours.    Pt last seen by cardiology 06/19/2022. Per OV note, "Preoperative Risk Assessment - The Revised Cardiac Risk Index = 1;  0.9% very low risk of perioperative myocardial infarction, pulmonary edema, ventricular fibrillation, cardiac arrest, or complete heart block.  - DASI score of 43 associated with 8 functional mets - No further cardiac testing is recommended prior to surgery.  - The patient may proceed to surgery at acceptable risk.  "  Pt was previously scheduled 07/23/2022.  Case cancelled DOS due moderate AI and increased size of aortic root and ascending aorta.  Dr. Glennon Mac discussed with CT surgery DOS who recommended a repeat CT scan.   Pt seen by CT surgery 07/30/2022. 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  He is a '6\' 3"'$  tall gentleman so these measurements are not quite as significant as if he were a shorter small frame person. I have personally reviewed his 2D echocardiogram, CT scan of the chest, abdomen, and pelvis from October 2023 and his current CTA of the chest from 07/27/2022.  I do not think the aorta has changed compared to his CT scan of the chest, abdomen, and pelvis in October 2023.  The radiologist just did not comment on the aortic root at that time.  In general the threshold for surgery is 5.5 cm.  He has a trileaflet aortic valve with moderate  asymptomatic aortic insufficiency, normal left ventricular systolic function and an LV diastolic diameter of 6.2 cm with a systolic diameter 3.7 cm.  There is no indication for replacement of his aortic valve and aortic aneurysm at this time.  The risk of aortic dissection is less than 0.5% and I stressed the importance of continued good blood pressure control in preventing further enlargement and acute aortic dissection.  I do not think there is any contraindication to proceeding with his cancer surgery which is a priority at this time.  He will require a CTA of the chest/aorta with gating and a 2D echocardiogram in 6 months and I will arrange those studies with follow-up with me at that time.  I reviewed all of the CT scan and echo images with the patient and his wife and answered their questions. "  Anticipate pt can proceed with planned procedure barring acute status change.   VS: There were no vitals taken for this visit.  PROVIDERS: [name], MD  Gilford Raid, MD is Cardiothoracic surgeon LABS: Labs reviewed: Acceptable for surgery. (all labs ordered are listed, but only abnormal results are displayed)  Labs Reviewed - No data to display   IMAGES: CT Angio Chest 07/27/2022 IMPRESSION: 1. Dilated aortic root measuring 5.3 cm diameter at the level of the sinuses of Valsalva. Ascending thoracic aortic aneurysm measuring 4.8 cm diameter. Ascending thoracic aortic aneurysm. Recommend semi-annual imaging followup by CTA or  MRA and referral to cardiothoracic surgery if not already obtained. This recommendation follows 2010 ACCF/AHA/AATS/ACR/ASA/SCA/SCAI/SIR/STS/SVM Guidelines for the Diagnosis and Management of Patients With Thoracic Aortic Disease. Circulation. 2010; 121: K240-X735. Aortic aneurysm NOS (ICD10-I71.9). 2. Mild cardiomegaly. Two-vessel coronary atherosclerosis. 3. Dilated main pulmonary artery, suggesting pulmonary arterial hypertension. 4. Stable 0.4 cm right  middle lobe pulmonary nodule since recent 05/17/2022 chest CT. Suggest attention on follow-up chest CT in 6 months given history of malignancy.  EKG:   CV: Echo 07/13/2022 1. Left ventricular ejection fraction, by estimation, is 60 to 65%. The  left ventricle has normal function. The left ventricle has no regional  wall motion abnormalities. The left ventricular internal cavity size was  mildly dilated. There is severe left  ventricular hypertrophy. Left ventricular diastolic parameters are  consistent with Grade II diastolic dysfunction (pseudonormalization).   2. Right ventricular systolic function is normal. The right ventricular  size is mildly enlarged.   3. Left atrial size was moderately dilated.   4. The mitral valve is normal in structure. No evidence of mitral valve  regurgitation. No evidence of mitral stenosis.   5. The aortic valve is normal in structure. Aortic valve regurgitation is  moderate. No aortic stenosis is present. Aortic regurgitation PHT measures  487 msec. Aortic valve area, by VTI measures 3.18 cm. Aortic valve mean  gradient measures 8.5 mmHg.  Aortic valve Vmax measures 2.09 m/s.   6. Aortic dilatation noted. 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   7. The inferior vena cava is dilated in size with <50% respiratory  variability, suggesting right atrial pressure of 15 mmHg.  Past Medical History:  Diagnosis Date   Arthralgia 01/11/2013   Diffuse, b/l Hips R>L Knees, ankles, low back   Arthritis    Back pain 12/08/2015   Cancer ([place])    Coronary artery disease    Diabetes mellitus type 2 in obese ([place]) 11/09/2012   Dyslipidemia 11/09/2012   Esophageal reflux 12/08/2015   History of migraine headaches    Hyperglycemia 11/09/2012   Hypertension     Past Surgical History:  Procedure Laterality Date   EUS N/A 06/21/2022   Procedure: LOWER ENDOSCOPIC  ULTRASOUND (EUS);  Surgeon: Irving Copas., MD;  Location: [facility];  Service: Gastroenterology;  Laterality: N/A;   FLEXIBLE SIGMOIDOSCOPY N/A 06/21/2022   Procedure: FLEXIBLE SIGMOIDOSCOPY;  Surgeon: Rush Landmark Telford Nab., MD;  Location: [facility];  Service: Gastroenterology;  Laterality: N/A;    MEDICATIONS: No current facility-administered medications for this encounter.    carvedilol (COREG) 25 MG tablet   esomeprazole (NEXIUM) 20 MG capsule   metFORMIN (GLUCOPHAGE) 500 MG tablet   Multiple Vitamins-Minerals (MULTIVITAMIN WITH MINERALS) tablet   tiZANidine (ZANAFLEX) 2 MG tablet   amLODipine (NORVASC) 5 MG tablet   glucose blood (COOL BLOOD GLUCOSE TEST STRIPS) test strip   Iron, Ferrous Sulfate, 325 (65 Fe) MG TABS   losartan (COZAAR) 100 MG tablet    [facility] Ward, PA-C [facility] Pre-Surgical Testing [phone]

## 2022-08-20 ENCOUNTER — Ambulatory Visit: Payer: BLUE CROSS/BLUE SHIELD | Admitting: Hematology

## 2022-08-23 ENCOUNTER — Inpatient Hospital Stay (HOSPITAL_COMMUNITY): Payer: BLUE CROSS/BLUE SHIELD | Admitting: Physician Assistant

## 2022-08-23 ENCOUNTER — Other Ambulatory Visit: Payer: Self-pay

## 2022-08-23 ENCOUNTER — Inpatient Hospital Stay (HOSPITAL_COMMUNITY)
Admission: RE | Admit: 2022-08-23 | Discharge: 2022-08-26 | DRG: 331 | Disposition: A | Discharge status: Home Health | Payer: BLUE CROSS/BLUE SHIELD | Source: Ambulatory Visit | Attending: Surgery | Admitting: Surgery

## 2022-08-23 ENCOUNTER — Encounter (HOSPITAL_COMMUNITY)
Admission: RE | Disposition: A | Discharge status: Home Health | Payer: Self-pay | Source: Ambulatory Visit | Attending: Surgery

## 2022-08-23 ENCOUNTER — Encounter (HOSPITAL_COMMUNITY): Payer: Self-pay | Admitting: Surgery

## 2022-08-23 DIAGNOSIS — I700 Atherosclerosis of aorta: Secondary | ICD-10-CM | POA: Diagnosis present

## 2022-08-23 DIAGNOSIS — Z87891 Personal history of nicotine dependence: Secondary | ICD-10-CM

## 2022-08-23 DIAGNOSIS — Z6834 Body mass index (BMI) 34.0-34.9, adult: Secondary | ICD-10-CM | POA: Diagnosis not present

## 2022-08-23 DIAGNOSIS — Z833 Family history of diabetes mellitus: Secondary | ICD-10-CM | POA: Diagnosis not present

## 2022-08-23 DIAGNOSIS — E1169 Type 2 diabetes mellitus with other specified complication: Principal | ICD-10-CM

## 2022-08-23 DIAGNOSIS — Z8249 Family history of ischemic heart disease and other diseases of the circulatory system: Secondary | ICD-10-CM | POA: Diagnosis not present

## 2022-08-23 DIAGNOSIS — Z886 Allergy status to analgesic agent status: Secondary | ICD-10-CM | POA: Diagnosis not present

## 2022-08-23 DIAGNOSIS — K219 Gastro-esophageal reflux disease without esophagitis: Secondary | ICD-10-CM | POA: Diagnosis present

## 2022-08-23 DIAGNOSIS — D509 Iron deficiency anemia, unspecified: Secondary | ICD-10-CM | POA: Diagnosis present

## 2022-08-23 DIAGNOSIS — Z888 Allergy status to other drugs, medicaments and biological substances status: Secondary | ICD-10-CM

## 2022-08-23 DIAGNOSIS — I2510 Atherosclerotic heart disease of native coronary artery without angina pectoris: Secondary | ICD-10-CM | POA: Diagnosis present

## 2022-08-23 DIAGNOSIS — E785 Hyperlipidemia, unspecified: Secondary | ICD-10-CM | POA: Diagnosis present

## 2022-08-23 DIAGNOSIS — Z7984 Long term (current) use of oral hypoglycemic drugs: Secondary | ICD-10-CM | POA: Diagnosis not present

## 2022-08-23 DIAGNOSIS — Z8261 Family history of arthritis: Secondary | ICD-10-CM | POA: Diagnosis not present

## 2022-08-23 DIAGNOSIS — K648 Other hemorrhoids: Secondary | ICD-10-CM | POA: Diagnosis present

## 2022-08-23 DIAGNOSIS — I10 Essential (primary) hypertension: Secondary | ICD-10-CM | POA: Diagnosis present

## 2022-08-23 DIAGNOSIS — I351 Nonrheumatic aortic (valve) insufficiency: Secondary | ICD-10-CM | POA: Diagnosis present

## 2022-08-23 DIAGNOSIS — Z932 Ileostomy status: Secondary | ICD-10-CM

## 2022-08-23 DIAGNOSIS — M1990 Unspecified osteoarthritis, unspecified site: Secondary | ICD-10-CM | POA: Diagnosis present

## 2022-08-23 DIAGNOSIS — Z91040 Latex allergy status: Secondary | ICD-10-CM | POA: Diagnosis not present

## 2022-08-23 DIAGNOSIS — C20 Malignant neoplasm of rectum: Principal | ICD-10-CM | POA: Diagnosis present

## 2022-08-23 DIAGNOSIS — I7121 Aneurysm of the ascending aorta, without rupture: Secondary | ICD-10-CM | POA: Diagnosis present

## 2022-08-23 DIAGNOSIS — E669 Obesity, unspecified: Secondary | ICD-10-CM | POA: Diagnosis present

## 2022-08-23 DIAGNOSIS — I517 Cardiomegaly: Secondary | ICD-10-CM | POA: Diagnosis present

## 2022-08-23 DIAGNOSIS — Z9889 Other specified postprocedural states: Principal | ICD-10-CM

## 2022-08-23 DIAGNOSIS — E119 Type 2 diabetes mellitus without complications: Secondary | ICD-10-CM | POA: Diagnosis present

## 2022-08-23 HISTORY — PX: DIVERTING ILEOSTOMY: SHX5799

## 2022-08-23 HISTORY — PX: XI ROBOTIC ASSISTED LOWER ANTERIOR RESECTION: SHX6558

## 2022-08-23 HISTORY — PX: FLEXIBLE SIGMOIDOSCOPY: SHX5431

## 2022-08-23 LAB — GLUCOSE, CAPILLARY
Glucose-Capillary: 141 mg/dL — ABNORMAL HIGH (ref 70–99)
Glucose-Capillary: 175 mg/dL — ABNORMAL HIGH (ref 70–99)
Glucose-Capillary: 182 mg/dL — ABNORMAL HIGH (ref 70–99)
Glucose-Capillary: 167 mg/dL — ABNORMAL HIGH (ref 70–99)

## 2022-08-23 LAB — CREATININE, SERUM
Creatinine, Ser: 1.18 mg/dL (ref 0.61–1.24)
GFR, Estimated: >60 mL/min (ref >60)

## 2022-08-23 LAB — CBC
HCT: 37.1 % — ABNORMAL LOW (ref 39.0–52.0)
Hemoglobin: 11.2 g/dL — ABNORMAL LOW (ref 13.0–17.0)
MCH: 24.5 pg — ABNORMAL LOW (ref 26.0–34.0)
MCHC: 30.2 g/dL (ref 30.0–36.0)
MCV: 81 fL (ref 80.0–100.0)
Platelets: 212 10*3/uL (ref 150–400)
RBC: 4.58 MIL/uL (ref 4.22–5.81)
RDW: 14.4 % (ref 11.5–15.5)
WBC: 13.7 10*3/uL — ABNORMAL HIGH (ref 4.0–10.5)
nRBC: 0 % (ref 0.0–0.2)

## 2022-08-23 SURGERY — RESECTION, RECTUM, LOW ANTERIOR, ROBOT-ASSISTED
Anesthesia: General

## 2022-08-23 MED ORDER — SUGAMMADEX SODIUM 500 MG/5ML IV SOLN
INTRAVENOUS | Status: AC
  Filled 2022-08-23: qty 5

## 2022-08-23 MED ORDER — FENTANYL CITRATE (PF) 100 MCG/2ML IJ SOLN
INTRAMUSCULAR | Status: AC
  Filled 2022-08-23: qty 2

## 2022-08-23 MED ORDER — EPHEDRINE SULFATE-NACL 50-0.9 MG/10ML-% IV SOSY
PREFILLED_SYRINGE | INTRAVENOUS | Status: DC | PRN
  Administered 2022-08-23: 10 mg via INTRAVENOUS
  Administered 2022-08-23 (×2): 15 mg via INTRAVENOUS
  Administered 2022-08-23 (×2): 5 mg via INTRAVENOUS

## 2022-08-23 MED ORDER — LIDOCAINE HCL (PF) 2 % IJ SOLN
INTRAMUSCULAR | Status: AC
  Filled 2022-08-23: qty 20

## 2022-08-23 MED ORDER — CHLORHEXIDINE GLUCONATE CLOTH 2 % EX PADS: 6.0000 | MEDICATED_PAD | Freq: Once | CUTANEOUS | Status: DC

## 2022-08-23 MED ORDER — OXYCODONE HCL 5 MG/5ML PO SOLN
5.0000 mg | Freq: Once | ORAL | Status: AC | PRN
  Administered 2022-08-23: 5 mg via ORAL

## 2022-08-23 MED ORDER — SODIUM CHLORIDE 0.9 % IV SOLN
2.0000 g | INTRAVENOUS | Status: AC
  Administered 2022-08-23: 2 g via INTRAVENOUS
  Filled 2022-08-23: qty 2

## 2022-08-23 MED ORDER — ROCURONIUM BROMIDE 10 MG/ML (PF) SYRINGE
PREFILLED_SYRINGE | INTRAVENOUS | Status: AC
  Filled 2022-08-23: qty 10

## 2022-08-23 MED ORDER — PROPOFOL 10 MG/ML IV BOLUS
INTRAVENOUS | Status: AC
  Filled 2022-08-23: qty 20

## 2022-08-23 MED ORDER — HEPARIN SODIUM (PORCINE) 5000 UNIT/ML IJ SOLN
5000.0000 [IU] | Freq: Three times a day (TID) | INTRAMUSCULAR | Status: DC
  Administered 2022-08-23 – 2022-08-26 (×8): 5000 [IU] via SUBCUTANEOUS
  Filled 2022-08-23 (×8): qty 1

## 2022-08-23 MED ORDER — INSULIN ASPART 100 UNIT/ML IJ SOLN
0.0000 [IU] | Freq: Three times a day (TID) | INTRAMUSCULAR | Status: DC
  Administered 2022-08-23 – 2022-08-24 (×3): 3 [IU] via SUBCUTANEOUS
  Administered 2022-08-24: 2 [IU] via SUBCUTANEOUS
  Administered 2022-08-25 (×2): 3 [IU] via SUBCUTANEOUS
  Administered 2022-08-26: 2 [IU] via SUBCUTANEOUS

## 2022-08-23 MED ORDER — ENSURE PRE-SURGERY PO LIQD
592.0000 mL | Freq: Once | ORAL | Status: DC
  Filled 2022-08-23: qty 592

## 2022-08-23 MED ORDER — ONDANSETRON HCL 4 MG/2ML IJ SOLN
INTRAMUSCULAR | Status: AC
  Filled 2022-08-23: qty 2

## 2022-08-23 MED ORDER — SIMETHICONE 80 MG PO CHEW: 40.0000 mg | CHEWABLE_TABLET | Freq: Four times a day (QID) | ORAL | Status: DC | PRN

## 2022-08-23 MED ORDER — ALVIMOPAN 12 MG PO CAPS
12.0000 mg | ORAL_CAPSULE | ORAL | Status: AC
  Administered 2022-08-23: 12 mg via ORAL
  Filled 2022-08-23: qty 1

## 2022-08-23 MED ORDER — PHENYLEPHRINE HCL-NACL 20-0.9 MG/250ML-% IV SOLN
INTRAVENOUS | Status: AC
  Filled 2022-08-23: qty 250

## 2022-08-23 MED ORDER — ALUM & MAG HYDROXIDE-SIMETH 200-200-20 MG/5ML PO SUSP: 30.0000 mL | Freq: Four times a day (QID) | ORAL | Status: DC | PRN

## 2022-08-23 MED ORDER — DIPHENHYDRAMINE HCL 12.5 MG/5ML PO ELIX: 12.5000 mg | ORAL_SOLUTION | Freq: Four times a day (QID) | ORAL | Status: DC | PRN

## 2022-08-23 MED ORDER — LOSARTAN POTASSIUM 50 MG PO TABS
100.0000 mg | ORAL_TABLET | Freq: Every day | ORAL | Status: DC
  Administered 2022-08-24 – 2022-08-26 (×3): 100 mg via ORAL
  Filled 2022-08-23 (×3): qty 2

## 2022-08-23 MED ORDER — DIPHENHYDRAMINE HCL 50 MG/ML IJ SOLN: 12.5000 mg | Freq: Four times a day (QID) | INTRAMUSCULAR | Status: DC | PRN

## 2022-08-23 MED ORDER — LIDOCAINE HCL (PF) 2 % IJ SOLN
INTRAMUSCULAR | Status: DC | PRN
  Administered 2022-08-23: 1.5 mg/kg/h via INTRADERMAL

## 2022-08-23 MED ORDER — BUPIVACAINE-EPINEPHRINE (PF) 0.5% -1:200000 IJ SOLN
INTRAMUSCULAR | Status: DC | PRN
  Administered 2022-08-23: 30 mL

## 2022-08-23 MED ORDER — ACETAMINOPHEN 500 MG PO TABS
1000.0000 mg | ORAL_TABLET | Freq: Four times a day (QID) | ORAL | Status: DC
  Administered 2022-08-23 – 2022-08-26 (×11): 1000 mg via ORAL
  Filled 2022-08-23 (×12): qty 2

## 2022-08-23 MED ORDER — ONDANSETRON HCL 4 MG/2ML IJ SOLN: 4.0000 mg | Freq: Four times a day (QID) | INTRAMUSCULAR | Status: DC | PRN

## 2022-08-23 MED ORDER — CARVEDILOL 25 MG PO TABS
25.0000 mg | ORAL_TABLET | Freq: Two times a day (BID) | ORAL | Status: DC
  Administered 2022-08-23 – 2022-08-26 (×6): 25 mg via ORAL
  Filled 2022-08-23 (×6): qty 1

## 2022-08-23 MED ORDER — INSULIN ASPART 100 UNIT/ML IJ SOLN: 0.0000 [IU] | Freq: Every day | INTRAMUSCULAR | Status: DC

## 2022-08-23 MED ORDER — BUPIVACAINE-EPINEPHRINE (PF) 0.5% -1:200000 IJ SOLN
INTRAMUSCULAR | Status: AC
  Filled 2022-08-23: qty 30

## 2022-08-23 MED ORDER — HYDROMORPHONE HCL 1 MG/ML IJ SOLN
0.2500 mg | INTRAMUSCULAR | Status: DC | PRN
  Administered 2022-08-23: 0.5 mg via INTRAVENOUS

## 2022-08-23 MED ORDER — ACETAMINOPHEN 500 MG PO TABS
1000.0000 mg | ORAL_TABLET | ORAL | Status: AC
  Administered 2022-08-23: 1000 mg via ORAL
  Filled 2022-08-23: qty 2

## 2022-08-23 MED ORDER — LIDOCAINE 2% (20 MG/ML) 5 ML SYRINGE
INTRAMUSCULAR | Status: DC | PRN
  Administered 2022-08-23: 60 mg via INTRAVENOUS

## 2022-08-23 MED ORDER — LACTATED RINGERS IV SOLN: INTRAVENOUS | Status: DC | PRN

## 2022-08-23 MED ORDER — ONDANSETRON HCL 4 MG/2ML IJ SOLN
INTRAMUSCULAR | Status: DC | PRN
  Administered 2022-08-23: 4 mg via INTRAVENOUS

## 2022-08-23 MED ORDER — 0.9 % SODIUM CHLORIDE (POUR BTL) OPTIME
TOPICAL | Status: DC | PRN
  Administered 2022-08-23: 1000 mL

## 2022-08-23 MED ORDER — OXYCODONE HCL 5 MG PO TABS: 5.0000 mg | ORAL_TABLET | Freq: Once | ORAL | Status: AC | PRN

## 2022-08-23 MED ORDER — BUPIVACAINE LIPOSOME 1.3 % IJ SUSP
INTRAMUSCULAR | Status: AC
  Filled 2022-08-23: qty 20

## 2022-08-23 MED ORDER — ALVIMOPAN 12 MG PO CAPS: 12.0000 mg | ORAL_CAPSULE | Freq: Two times a day (BID) | ORAL | Status: DC

## 2022-08-23 MED ORDER — DEXAMETHASONE SODIUM PHOSPHATE 10 MG/ML IJ SOLN
INTRAMUSCULAR | Status: DC | PRN
  Administered 2022-08-23: 4 mg via INTRAVENOUS

## 2022-08-23 MED ORDER — ORAL CARE MOUTH RINSE: 15.0000 mL | Freq: Once | OROMUCOSAL | Status: AC

## 2022-08-23 MED ORDER — ROCURONIUM BROMIDE 10 MG/ML (PF) SYRINGE
PREFILLED_SYRINGE | INTRAVENOUS | Status: DC | PRN
  Administered 2022-08-23: 20 mg via INTRAVENOUS
  Administered 2022-08-23: 60 mg via INTRAVENOUS
  Administered 2022-08-23: 20 mg via INTRAVENOUS
  Administered 2022-08-23: 40 mg via INTRAVENOUS

## 2022-08-23 MED ORDER — HEPARIN SODIUM (PORCINE) 5000 UNIT/ML IJ SOLN
5000.0000 [IU] | Freq: Once | INTRAMUSCULAR | Status: AC
  Administered 2022-08-23: 5000 [IU] via SUBCUTANEOUS
  Filled 2022-08-23: qty 1

## 2022-08-23 MED ORDER — ENSURE PRE-SURGERY PO LIQD
296.0000 mL | Freq: Once | ORAL | Status: DC
  Filled 2022-08-23: qty 296

## 2022-08-23 MED ORDER — MIDAZOLAM HCL 2 MG/2ML IJ SOLN
INTRAMUSCULAR | Status: AC
  Filled 2022-08-23: qty 2

## 2022-08-23 MED ORDER — HYDROMORPHONE HCL 1 MG/ML IJ SOLN
0.5000 mg | INTRAMUSCULAR | Status: DC | PRN
  Administered 2022-08-23 – 2022-08-24 (×5): 0.5 mg via INTRAVENOUS
  Filled 2022-08-23 (×5): qty 0.5

## 2022-08-23 MED ORDER — HYDROMORPHONE HCL 1 MG/ML IJ SOLN
INTRAMUSCULAR | Status: AC
  Administered 2022-08-23: 0.5 mg via INTRAVENOUS
  Filled 2022-08-23: qty 1

## 2022-08-23 MED ORDER — TIZANIDINE HCL 2 MG PO TABS
1.0000 mg | ORAL_TABLET | Freq: Two times a day (BID) | ORAL | Status: DC | PRN
  Administered 2022-08-25: 4 mg via ORAL
  Filled 2022-08-23 (×2): qty 2

## 2022-08-23 MED ORDER — TRAMADOL HCL 50 MG PO TABS
50.0000 mg | ORAL_TABLET | Freq: Four times a day (QID) | ORAL | Status: DC | PRN
  Administered 2022-08-24 – 2022-08-25 (×4): 50 mg via ORAL
  Filled 2022-08-23 (×4): qty 1

## 2022-08-23 MED ORDER — ENSURE SURGERY PO LIQD
237.0000 mL | Freq: Two times a day (BID) | ORAL | Status: DC
  Administered 2022-08-24 – 2022-08-26 (×5): 237 mL via ORAL

## 2022-08-23 MED ORDER — BUPIVACAINE LIPOSOME 1.3 % IJ SUSP
INTRAMUSCULAR | Status: DC | PRN
  Administered 2022-08-23: 20 mL

## 2022-08-23 MED ORDER — HYDRALAZINE HCL 20 MG/ML IJ SOLN: 10.0000 mg | INTRAMUSCULAR | Status: DC | PRN

## 2022-08-23 MED ORDER — FENTANYL CITRATE (PF) 100 MCG/2ML IJ SOLN
INTRAMUSCULAR | Status: DC | PRN
  Administered 2022-08-23 (×3): 50 ug via INTRAVENOUS
  Administered 2022-08-23: 100 ug via INTRAVENOUS
  Administered 2022-08-23: 50 ug via INTRAVENOUS

## 2022-08-23 MED ORDER — CLEVIDIPINE BUTYRATE 0.5 MG/ML IV EMUL
0.0000 mg/h | INTRAVENOUS | Status: DC
  Filled 2022-08-23: qty 50

## 2022-08-23 MED ORDER — BUPIVACAINE LIPOSOME 1.3 % IJ SUSP: 20.0000 mL | Freq: Once | INTRAMUSCULAR | Status: DC

## 2022-08-23 MED ORDER — PHENYLEPHRINE HCL (PRESSORS) 10 MG/ML IV SOLN
INTRAVENOUS | Status: AC
  Filled 2022-08-23: qty 1

## 2022-08-23 MED ORDER — LACTATED RINGERS IR SOLN
Status: DC | PRN
  Administered 2022-08-23: 1000 mL

## 2022-08-23 MED ORDER — CHLORHEXIDINE GLUCONATE 0.12 % MT SOLN
15.0000 mL | Freq: Once | OROMUCOSAL | Status: AC
  Administered 2022-08-23: 15 mL via OROMUCOSAL

## 2022-08-23 MED ORDER — PANTOPRAZOLE SODIUM 40 MG PO TBEC
40.0000 mg | DELAYED_RELEASE_TABLET | Freq: Every day | ORAL | Status: DC
  Administered 2022-08-24 – 2022-08-26 (×3): 40 mg via ORAL
  Filled 2022-08-23 (×3): qty 1

## 2022-08-23 MED ORDER — EPHEDRINE 5 MG/ML INJ
INTRAVENOUS | Status: AC
  Filled 2022-08-23: qty 5

## 2022-08-23 MED ORDER — LACTATED RINGERS IV SOLN: INTRAVENOUS | Status: DC

## 2022-08-23 MED ORDER — DEXAMETHASONE SODIUM PHOSPHATE 10 MG/ML IJ SOLN
INTRAMUSCULAR | Status: AC
  Filled 2022-08-23: qty 1

## 2022-08-23 MED ORDER — LIDOCAINE HCL (PF) 2 % IJ SOLN
INTRAMUSCULAR | Status: AC
  Filled 2022-08-23: qty 5

## 2022-08-23 MED ORDER — SUGAMMADEX SODIUM 500 MG/5ML IV SOLN
INTRAVENOUS | Status: DC | PRN
  Administered 2022-08-23: 300 mg via INTRAVENOUS

## 2022-08-23 MED ORDER — MIDAZOLAM HCL 5 MG/5ML IJ SOLN
INTRAMUSCULAR | Status: DC | PRN
  Administered 2022-08-23: 2 mg via INTRAVENOUS

## 2022-08-23 MED ORDER — AMLODIPINE BESYLATE 5 MG PO TABS
5.0000 mg | ORAL_TABLET | Freq: Every day | ORAL | Status: DC
  Administered 2022-08-24 – 2022-08-26 (×3): 5 mg via ORAL
  Filled 2022-08-23 (×3): qty 1

## 2022-08-23 MED ORDER — OXYCODONE HCL 5 MG/5ML PO SOLN
ORAL | Status: AC
  Filled 2022-08-23: qty 5

## 2022-08-23 MED ORDER — PROPOFOL 10 MG/ML IV BOLUS
INTRAVENOUS | Status: DC | PRN
  Administered 2022-08-23: 150 mg via INTRAVENOUS
  Administered 2022-08-23: 20 mg via INTRAVENOUS

## 2022-08-23 MED ORDER — ONDANSETRON HCL 4 MG PO TABS: 4.0000 mg | ORAL_TABLET | Freq: Four times a day (QID) | ORAL | Status: DC | PRN

## 2022-08-23 MED ORDER — PHENYLEPHRINE HCL-NACL 20-0.9 MG/250ML-% IV SOLN
INTRAVENOUS | Status: DC | PRN
  Administered 2022-08-23: 20 ug/min via INTRAVENOUS

## 2022-08-23 MED ORDER — INDOCYANINE GREEN 25 MG IV SOLR
INTRAVENOUS | Status: DC | PRN
  Administered 2022-08-23: 2.5 mg via INTRAVENOUS

## 2022-08-23 SURGICAL SUPPLY — 118 items
APPLIER CLIP 5 13 M/L LIGAMAX5 (MISCELLANEOUS)
APPLIER CLIP ROT 10 11.4 M/L (STAPLE)
BAG COUNTER SPONGE SURGICOUNT (BAG) IMPLANT
BLADE EXTENDED COATED 6.5IN (ELECTRODE) ×1 IMPLANT
CANNULA REDUC XI 12-8 STAPL (CANNULA) ×1
CANNULA REDUCER 12-8 DVNC XI (CANNULA) ×1 IMPLANT
CATH SILICONE 16FRX5CC (CATHETERS) IMPLANT
CELLS DAT CNTRL 66122 CELL SVR (MISCELLANEOUS) IMPLANT
CHLORAPREP W/TINT 26 (MISCELLANEOUS) ×1 IMPLANT
CLIP APPLIE 5 13 M/L LIGAMAX5 (MISCELLANEOUS) IMPLANT
CLIP APPLIE ROT 10 11.4 M/L (STAPLE) IMPLANT
CLIP LIGATING HEM O LOK PURPLE (MISCELLANEOUS) IMPLANT
CLIP LIGATING HEMO O LOK GREEN (MISCELLANEOUS) IMPLANT
COVER SURGICAL LIGHT HANDLE (MISCELLANEOUS) ×2 IMPLANT
COVER TIP SHEARS 8 DVNC (MISCELLANEOUS) ×1 IMPLANT
COVER TIP SHEARS 8MM DA VINCI (MISCELLANEOUS) ×1
DEFOGGER SCOPE WARMER CLEARIFY (MISCELLANEOUS) ×1 IMPLANT
DEVICE TROCAR PUNCTURE CLOSURE (ENDOMECHANICALS) IMPLANT
DRAIN CHANNEL 19F RND (DRAIN) ×1 IMPLANT
DRAPE ARM DVNC X/XI (DISPOSABLE) ×4 IMPLANT
DRAPE COLUMN DVNC XI (DISPOSABLE) ×1 IMPLANT
DRAPE DA VINCI XI ARM (DISPOSABLE) ×4
DRAPE DA VINCI XI COLUMN (DISPOSABLE) ×1
DRAPE SURG IRRIG POUCH 19X23 (DRAPES) ×1 IMPLANT
DRSG OPSITE POSTOP 4X10 (GAUZE/BANDAGES/DRESSINGS) IMPLANT
DRSG OPSITE POSTOP 4X6 (GAUZE/BANDAGES/DRESSINGS) IMPLANT
DRSG OPSITE POSTOP 4X8 (GAUZE/BANDAGES/DRESSINGS) IMPLANT
DRSG TEGADERM 2-3/8X2-3/4 SM (GAUZE/BANDAGES/DRESSINGS) ×5 IMPLANT
DRSG TEGADERM 4X4.75 (GAUZE/BANDAGES/DRESSINGS) ×1 IMPLANT
ELECT REM PT RETURN 15FT ADLT (MISCELLANEOUS) ×1 IMPLANT
ENDOLOOP SUT PDS II  0 18 (SUTURE)
ENDOLOOP SUT PDS II 0 18 (SUTURE) IMPLANT
EVACUATOR SILICONE 100CC (DRAIN) ×1 IMPLANT
GAUZE SPONGE 2X2 STRL 8-PLY (GAUZE/BANDAGES/DRESSINGS) ×1 IMPLANT
GAUZE SPONGE 4X4 12PLY STRL (GAUZE/BANDAGES/DRESSINGS) IMPLANT
GLOVE BIO SURGEON STRL SZ7.5 (GLOVE) ×3 IMPLANT
GLOVE INDICATOR 8.0 STRL GRN (GLOVE) ×3 IMPLANT
GOWN SRG XL LVL 4 BRTHBL STRL (GOWNS) ×1 IMPLANT
GOWN STRL NON-REIN XL LVL4 (GOWNS) ×1
GOWN STRL REUS W/ TWL XL LVL3 (GOWN DISPOSABLE) ×5 IMPLANT
GOWN STRL REUS W/TWL XL LVL3 (GOWN DISPOSABLE) ×5
GRASPER SUT TROCAR 14GX15 (MISCELLANEOUS) IMPLANT
HOLDER FOLEY CATH W/STRAP (MISCELLANEOUS) ×1 IMPLANT
IRRIG SUCT STRYKERFLOW 2 WTIP (MISCELLANEOUS) ×1
IRRIGATION SUCT STRKRFLW 2 WTP (MISCELLANEOUS) ×1 IMPLANT
KIT PROCEDURE DA VINCI SI (MISCELLANEOUS)
KIT PROCEDURE DVNC SI (MISCELLANEOUS) IMPLANT
KIT TURNOVER KIT A (KITS) IMPLANT
NDL INSUFFLATION 14GA 120MM (NEEDLE) ×1 IMPLANT
NEEDLE INSUFFLATION 14GA 120MM (NEEDLE) ×1 IMPLANT
PACK CARDIOVASCULAR III (CUSTOM PROCEDURE TRAY) ×1 IMPLANT
PACK COLON (CUSTOM PROCEDURE TRAY) ×1 IMPLANT
PAD POSITIONING PINK XL (MISCELLANEOUS) ×1 IMPLANT
PENCIL SMOKE EVACUATOR (MISCELLANEOUS) IMPLANT
PROTECTOR NERVE ULNAR (MISCELLANEOUS) ×2 IMPLANT
RELOAD STAPLE 45 3.5 BLU DVNC (STAPLE) IMPLANT
RELOAD STAPLE 45 4.3 GRN DVNC (STAPLE) IMPLANT
RELOAD STAPLE 60 3.5 BLU DVNC (STAPLE) IMPLANT
RELOAD STAPLE 60 4.3 GRN DVNC (STAPLE) IMPLANT
RELOAD STAPLER 3.5X45 BLU DVNC (STAPLE) IMPLANT
RELOAD STAPLER 3.5X60 BLU DVNC (STAPLE) IMPLANT
RELOAD STAPLER 4.3X45 GRN DVNC (STAPLE) IMPLANT
RELOAD STAPLER 4.3X60 GRN DVNC (STAPLE) ×3 IMPLANT
RETRACTOR WND ALEXIS 18 MED (MISCELLANEOUS) IMPLANT
RTRCTR WOUND ALEXIS 18CM MED (MISCELLANEOUS)
SCISSORS LAP 5X35 DISP (ENDOMECHANICALS) IMPLANT
SEAL CANN UNIV 5-8 DVNC XI (MISCELLANEOUS) ×4 IMPLANT
SEAL XI 5MM-8MM UNIVERSAL (MISCELLANEOUS) ×4
SEALER VESSEL DA VINCI XI (MISCELLANEOUS) ×1
SEALER VESSEL EXT DVNC XI (MISCELLANEOUS) ×1 IMPLANT
SLEEVE ADV FIXATION 5X100MM (TROCAR) IMPLANT
SOLUTION ELECTROLUBE (MISCELLANEOUS) ×1 IMPLANT
SPIKE FLUID TRANSFER (MISCELLANEOUS) ×1 IMPLANT
STAPLER 60 DA VINCI SURE FORM (STAPLE)
STAPLER 60 SUREFORM DVNC (STAPLE) IMPLANT
STAPLER CANNULA SEAL DVNC XI (STAPLE) ×1 IMPLANT
STAPLER CANNULA SEAL XI (STAPLE) ×1
STAPLER ECHELON POWER CIR 29 (STAPLE) IMPLANT
STAPLER ECHELON POWER CIR 31 (STAPLE) IMPLANT
STAPLER RELOAD 3.5X45 BLU DVNC (STAPLE)
STAPLER RELOAD 3.5X45 BLUE (STAPLE)
STAPLER RELOAD 3.5X60 BLU DVNC (STAPLE)
STAPLER RELOAD 3.5X60 BLUE (STAPLE)
STAPLER RELOAD 4.3X45 GREEN (STAPLE)
STAPLER RELOAD 4.3X45 GRN DVNC (STAPLE)
STAPLER RELOAD 4.3X60 GREEN (STAPLE) ×3
STAPLER RELOAD 4.3X60 GRN DVNC (STAPLE) ×3
STOPCOCK 4 WAY LG BORE MALE ST (IV SETS) ×2 IMPLANT
SURGILUBE 2OZ TUBE FLIPTOP (MISCELLANEOUS) ×1 IMPLANT
SUT MNCRL AB 4-0 PS2 18 (SUTURE) ×1 IMPLANT
SUT PDS AB 1 CT1 27 (SUTURE) IMPLANT
SUT PDS AB 1 TP1 96 (SUTURE) IMPLANT
SUT PROLENE 0 CT 2 (SUTURE) IMPLANT
SUT PROLENE 2 0 KS (SUTURE) ×1 IMPLANT
SUT PROLENE 2 0 SH DA (SUTURE) IMPLANT
SUT SILK 2 0 (SUTURE)
SUT SILK 2 0 SH CR/8 (SUTURE) IMPLANT
SUT SILK 2-0 18XBRD TIE 12 (SUTURE) IMPLANT
SUT SILK 3 0 (SUTURE) ×1
SUT SILK 3 0 SH CR/8 (SUTURE) ×1 IMPLANT
SUT SILK 3-0 18XBRD TIE 12 (SUTURE) ×1 IMPLANT
SUT V-LOC BARB 180 2/0GR6 GS22 (SUTURE)
SUT VIC AB 3-0 SH 18 (SUTURE) IMPLANT
SUT VIC AB 3-0 SH 27 (SUTURE)
SUT VIC AB 3-0 SH 27XBRD (SUTURE) IMPLANT
SUT VICRYL 0 UR6 27IN ABS (SUTURE) ×1 IMPLANT
SUTURE V-LC BRB 180 2/0GR6GS22 (SUTURE) IMPLANT
SYR 10ML LL (SYRINGE) ×1 IMPLANT
SYS LAPSCP GELPORT 120MM (MISCELLANEOUS)
SYS WOUND ALEXIS 18CM MED (MISCELLANEOUS) ×1
SYSTEM LAPSCP GELPORT 120MM (MISCELLANEOUS) IMPLANT
SYSTEM WOUND ALEXIS 18CM MED (MISCELLANEOUS) ×1 IMPLANT
TAPE UMBILICAL 1/8 X36 TWILL (MISCELLANEOUS) ×1 IMPLANT
TOWEL OR NON WOVEN STRL DISP B (DISPOSABLE) ×1 IMPLANT
TRAY FOLEY MTR SLVR 16FR STAT (SET/KITS/TRAYS/PACK) ×1 IMPLANT
TROCAR ADV FIXATION 5X100MM (TROCAR) ×1 IMPLANT
TUBING CONNECTING 10 (TUBING) ×3 IMPLANT
TUBING INSUFFLATION 10FT LAP (TUBING) ×1 IMPLANT

## 2022-08-23 NOTE — Op Note (Signed)
PATIENT: Robert [name]  52 y.o. male  Patient Care Team: [name], MD as PCP - General (Family Medicine) Werner Lean, MD as PCP - Cardiology (Cardiology) Truitt Merle, MD as Consulting Physician (Oncology) Alla Feeling, NP as Nurse Practitioner (Oncology) Royston Bake, RN as Oncology Nurse Navigator (Oncology)  PREOP DIAGNOSIS: rectal cancer  POSTOP DIAGNOSIS: rectal cancer  PROCEDURE:  Robotic assisted low anterior resection with diverting loop ileostomy Diagnostic flexible sigmoidoscopy - necessary to confirm tumor location Intraoperative assessment of perfusion using ICG fluorescence imaging Bilateral transversus abdominus plane (TAP) blocks  SURGEON: Sharon Mt. Dema Severin, MD  ASSISTANT: Leighton Ruff, MD  ANESTHESIA: General endotracheal  EBL: 40 mL Total I/O In: 100 [IV Piggyback:100] Out: 13 [Blood:50]  DRAINS: None  SPECIMEN: Rectosigmoid colon - staple line is the distal end Distal anastomotic donut  COUNTS: Sponge, needle and instrument counts were reported correct x2  FINDINGS: Tattoo in proximal rectum. Flexible sigmoidoscope demonstrates the fungiating mass to be located distal to this along the right posterior wall. With the endoscope in place, we are able to orient our distal point of transection of ~2 cm with sufficient distal margin. Narrow male pelvis increasing the complexity of the dissection. No obvious metastatic disease on visceral or parietal peritoneum or liver. A well perfused, tension free, hemostatic, air tight 31 mm EEA colorectal anastomosis fashioned 4 cm from the anal verge by a straightened flexible sigmoidoscopy (approximately 2 cm from the top of the sphincter complex).   NARRATIVE: Informed consent was verified. The patient was taken to the operating room, placed supine on the operating table and SCD's were applied. General endotracheal anesthesia was induced without difficulty. He was then positioned in the lithotomy  position with Allen stirrups.  Pressure points were evaluated and padded.  A foley catheter was then placed by nursing under sterile conditions. Hair on the abdomen was clipped.  He was secured to the operating table. The abdomen was then prepped and draped in the standard sterile fashion. Surgical timeout was called indicating the correct patient, procedure, positioning and need for preoperative antibiotics.   An OG tube was placed by anesthesia and confirmed to be to suction.  At Palmer's point, a stab incision was created and the Veress needle was introduced into the peritoneal cavity on the first attempt.  Intraperitoneal location was confirmed by the aspiration and saline drop test.  Pneumoperitoneum was established to a maximum pressure of 15 mmHg using CO2.  Following this, the abdomen was marked for planned trocar sites.  Just to the right and cephalad to the umbilicus, an 8 mm incision was created and an 8 mm blunt tipped robotic trocar was cautiously placed into the peritoneal cavity.  The laparoscope was inserted and demonstrated no evidence of trocar site nor Veress needle site complications.  The Veress needle was removed.  Bilateral transversus abdominis plane blocks were then created using a dilute mixture of Exparel with Marcaine.  3 additional 8 mm robotic trochars were placed under direct visualization roughly in a line extending from the right ASIS towards the left upper quadrant. The bladder was inspected and noted to be at/below the pubic symphysis.  Staying 3 fingerbreadths above the pubic symphysis, an incision was created and the 12 mm robotic trocar inserted directed cephalad into the peritoneal cavity under direct visualization.  An additional 5 mm assist port was placed in the right lateral abdomen under direct visualization.  The abdomen was surveyed and there are normal peritoneal surfaces. Surface of  liver is also normal in appearance. He was positioned in Trendelenburg with the left  side tilted slightly up.  The omentum is reflected cephalad. Small bowel was carefully retracted out of the pelvis. Tattoo is evident towards the proximal rectum. There is no evidence of any sort of dimpling or visible mass.  The robot was then docked and I went to the console.    The sigmoid colon was readily identified and normal in appearance.  Attachments of the sigmoid colon were taken down from the intersigmoid fossa.  The rectosigmoid colon was grasped and elevated anteriorly. Beginning with a medial to lateral approach, the peritoneum overlying the presacral space was carefully incised.  The TME plane was readily gained working in a plane between the fascia propria of the rectum and the presacral fascia.  Hypogastric nerves were seen going along the the presacral fascia and were protected free of injury.  Working more proximally, the mesorectum and sigmoid mesentery were carefully mobilized off of the peritoneum.  The left ureter was identified and protected free of injury.  The left gonadal vessels were identified and protected.  These were both swept "down."  The superior hemorrhoidal and IMA pedicles are identified. Further mesocolon was mobilized proximally staying in this plane between the retroperitoneum proper and the mesocolon. Attention was then turned to the lateral portion of dissection.  The sigmoid colon was then retracted to the right.  The sigmoid colon was fully mobilized. The descending colon was mobilized by incising the Lillyanne Bradburn line of Toldt.  This was done all the way up to the level of the splenic flexure.  The associated mesocolon was also mobilized medially.  The left ureter again was confirmed to be well away from the vasculature which had been dissected medially.  The rectosigmoid colon was elevated anteriorly. The left ureter was re-identified. The IMA was clear of this and dissected circumferentially. The IMA was then sealed and divided with the vessel sealer. The stump was  inspected and noted to be completely hemostatic with a good seal.  The mesentery was divided out to the point of planned proximal division.  This is on the proximal portion of the sigmoid colon.  There is a visible pulse in the mesentery going out to the planned point of proximal transection.  Attention then directed at the pelvic portion of the dissection.  Continue within her total mesorectal excision plane posteriorly first, dissection is carried into the deep pelvis.  Or working between the fascia propria the rectum and the presacral fascia.  This plane was then carried laterally on both the right and left sides. Staying within the pelvic inlet and medial to the gonaldals and ureters, the stalks of the rectum were taken down bilaterally.  The TME plane is then continued anteriorly last.  The peritoneal reflection is incised.  The space is continued working between the fascia propria the rectum and the prostate.  We are able to stay well beneath the prostate gland.  The mid sigmoid colon is then gently occluded using a tip up grasper.  I then went below to pass a flexible sigmoidoscope.  Under direct visualization, the sigmoidoscope was inserted into the anal canal and advanced into the rectum.  The mass is visualized within the midportion of the rectum on the right posterior side.  It is ulcerated/fungating in appearance typical in appearance for rectal cancer.  The tattoo sits well proximal to this mass.  Were able to see where the endoscope was from a robotic camera.  We are able to orient a margin approximately 2 cm distal to this mass.  Insufflation was then evacuated in the rectum.  I went back to the console.   The distal point of transection on the proximal rectum was identified.   A 60 mm green load robotic stapler was then placed through the 12 mm port at the Pfannenstiel location and introduced into the peritoneal cavity.  He has a large rectum.  The rectum was divided with 3 firings of the  stapler.  The staple line on the rectal stump is intact and healthy in appearance.   Attention was turned to performing a perfusion test. 2.5 mg of indocyanine green was administered by anesthesia and at the level of the cleared mesentery proximally, there was excellent uptake of the tracer.  The rectum is also well perfused in appearance.  There was a visible pulse in the mesentery out to the level of the cleared colon at the level of the proximal sigmoid/descending colon junction.  This colon is also supple and healthy in appearance without any thickening.  This reached into the pelvis without any difficulty and remained in that location without any tension. The terminal ileum is mobilized off the underlying retroperitoneum by releasing congenital attachments. In doing so, the loop ileostomy will reach the abdominal wall. A locking grasper was then placed on the sigmoid staple line.    Attention was turned to the extracorporeal portion of the procedure.  The robot was undocked.  I scrubbed back in.  Using the 12 mm trocar site, a Pfannenstiel incision was created and incorporated the fascial opening through the 12 mm port site.  The rectus fascia was incised and then elevated.  The rectus muscle was mobilized free of the overlying fascia.  The peritoneum was incised in the midline well above the location of the bladder.  An [place] wound protector was placed.  Towels were placed around the field.  The divided colon was passed through the wound protector.  The point of proximal division was identified and was again on a healthy segment of supple colon with a palpable pulse in the mesentery. This is pink in color.  A pursestring device was applied.  A 2-0 Prolene on a Keith needle was passed.  The colon was divided and passed off with the open end being proximal.  I opened the specimen on the back table - the mass has been fully excised with clear margins ~2 cm distally including staple line.  I scrubbed back  in. EEA sizers were then introduced and a 31 mm EEA selected.  "Belt loops" consisting of 3-0 silk were placed around the pursestring suture line.  The anvil was placed and the pursestring tied. A small amount of fat was cleared from the planned anastomosis and no diverticula were apparent within this.  This was placed back into the abdomen and a cap placed over the wound protector port site.  Pneumoperitoneum was reestablished.  I then went below to pass the stapler.  My partner remained above. Flexible sigmoidoscopy from below demonstrates an intact staple line with healthy distal rectal mucosa, air tight. EEA sizers were cautiously introduced via the anus and advanced under direct visualization.  The stapler was passed and the spike deployed just anterior to the staple line.  The components were then mated.  Orientation was confirmed such that there is no twisting of the colon nor small bowel underneath the mesenteric defect. Care was taken to ensure no other structures were incorporated  within this either by retracting the prostate anteriorly.  The stapler was then closed, held, and fired. This was then removed. The donuts were inspected and noted to be complete.  The colon proximal to the anastomosis was then gently occluded. The pelvis was filled with sterile irrigation. Under direct visualization, I passed a flexible sigmoidoscope.  The anastomosis was under water.  With good distention of the anastomosis there was no air leak. The anastomosis pink in appearance.  This is located at ~4 cm from the anal verge by flexible sigmoidoscopy and about 2 cm above the internal anal sphincter complex.  It is hemostatic.  Additionally, looking from above, there is no tension on the colon or mesentery.  Sigmoidoscope was withdrawn.  Irrigation was evacuated from the pelvis.  The abdomen and pelvis are surveyed and noted to be completely hemostatic without any apparent injury.  I scrubbed back in. The planned ostomy site  in the right abdomen is identified and the skin excised. The subcutaneous tissue divided and the rectus fascia incised in a cruciate manner. The underlying rectus is spread and the abdomen entered bluntly with a hand in place. The loop of distal ileum is passed and maintained with a Babcock. Orientation confirmed such that there is no twisting.   Under direct visualization, all trochars are removed.  The [place] wound protector was removed.  Gowns/gloves are changed and a fresh set of clean instruments utilized. Additional sterile drapes were placed around the field.   The Pfannenstiel peritoneum was closed with a running 0 Vicryl suture.  The rectus fascia was then closed using 2 running #1 PDS sutures.  The fascia was then palpated and noted to be completely closed.  Additional anesthetic was infiltrated at the Pfannenstiel site.  Sponge, needle, and instrument counts were reported correct x2. 4-0 Monocryl subcuticular suture was used to close the skin of all incision sites.  Dermabond was placed over all incisions.   Attention was then directed to maturing the loop ileostomy.  This is incised and matured in [place] fashion using 3-0 vicryl suture.  This reached well above the level of the skin without any difficulty and was not under any tension.  Therefore, a stoma rod was not utilized.  An ostomy appliance was then cut to fit.  Final counts were correct.   He was then taken out of lithotomy, awakened from anesthesia, extubated, and transferred to a stretcher for transport to PACU in satisfactory condition having tolerated the procedure well.

## 2022-08-23 NOTE — Consult Note (Signed)
WOC consulted for new ileostomy; aware of needs. Will follow up POD # 1 to assess needs.  [place], [place], [place]

## 2022-08-23 NOTE — Transfer of Care (Signed)
Immediate Anesthesia Transfer of Care Note  Patient: Robert [name]  Procedure(s) Performed: XI ROBOTIC ASSISTED LOWER ANTERIOR RESECTION with Introperative assessment of profusion and TAP Block FLEXIBLE SIGMOIDOSCOPY DIVERTING LOOP ILEOSTOMY  Patient Location: PACU  Anesthesia Type:General  Level of Consciousness: drowsy and patient cooperative  Airway & Oxygen Therapy: Patient Spontanous Breathing and Patient connected to face mask oxygen  Post-op Assessment: Report given to RN and Post -op Vital signs reviewed and stable  Post vital signs: Reviewed and stable  Last Vitals:  Vitals Value Taken Time  BP    Temp    Pulse    Resp    SpO2      Last Pain:  Vitals:   08/23/22 0549  TempSrc: Oral  PainSc: 0-No pain         Complications: No notable events documented.

## 2022-08-23 NOTE — Progress Notes (Signed)
0645 Paged Dr. Deland Pretty for orders

## 2022-08-23 NOTE — Progress Notes (Signed)
0630 Called Dr. Deland Pretty for orders, consent and antibiotic.

## 2022-08-23 NOTE — H&P (Signed)
CC: Here today for surgery  HPI: Robert [name] is an 52 y.o. male with history of HTN, DM, GERD, CAD, whom is seen in the office today as a referral by Dr. Pasty Arch for evaluation of rectal cancer  Colonoscopy with Dr. Fuller Plan 05/09/22 -  Five, 5-7 mm polyps removed Proximal rectal tumor, biopsied, submucosal injection tattoo - area proximal to mass tattoo'd x2 Internal hemorrhoids  PATH - Tubular adenomas from poylps; rectal mass - invasive mod diff adenoCA  CT CAP 05/18/22 -  1. Known primary malignancy is not well visualized. Correlate with recent colonoscopy. 2. No evidence of metastatic disease in the chest, abdomen or pelvis. 3. Mildly enlarged bilateral inguinal lymph nodes, likely reactive. 4. 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 Recommend annual imaging followup by CTA or MRA. This recommendation follows 2010 ACCF/AHA/AATS/ACR/ASA/SCA/SCAI/SIR/STS/SVM Guidelines for the Diagnosis and Management of Patients with Thoracic Aortic Disease. Circulation. 2010; 121: Y185-U314. 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 Severe coronary artery calcifications of the LAD and circumflex. 6. Aortic Atherosclerosis (ICD10-I70.0).  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  He notes history of approximately 2 to 3 years of intermittent dark red blood per rectum that coats the stool. Generally not bright or dripping in the toilet bowl. This resolved for some time at which point he had forgotten about it. More recently, had increased again.  Referred to see [place] for evaluation. Of note, his case was presented at our multidisciplinary tumor board 05/30/2022. Consensus recommendation was for EUS for further local staging categorization to help delineate if this is actually a T1 vs T2 lesion.  He was recently started on  iron by Dr. Fuller Plan for possible iron deficiency anemia. Hemoglobin is 12.5 on 9/27. This was down from 14.6 back in March.  Underwent EUS with Dr. Rush Landmark 06/21/2022: FLEX Impression: - Diverticulosis in the recto-sigmoid colon and in the sigmoid colon. - Malignant tumor in the proximal rectum (11-14 cm) - Non-bleeding non-thrombosed internal hemorrhoids. EUS Impression: - Rectal mass was visualized endosonographically. It invades the muscularis propria but does not extend through it. A tissue diagnosis was obtained prior to this exam. This is consistent with adenocarcinoma. This was staged uT2 uN0. - No malignant-appearing lymph nodes were visualized endosonographically in the perirectal region and in the left iliac region. - The internal anal sphincter was visualized endosonographically and appeared normal.  Case was reviewed with our multidisciplinary board and consensus was for upfront surgery given this endoscopic ultrasound finding. Surgery has been scheduled. He returns to see me for follow-up.  INTERVAL HX Case scheduled for 12/15 and arrived as scheduled day of surgery. Please refer to notes from anesthesia and TCTS for details. Case was postponed at their advisement. He was rescheduled for today after meeting with Dr. Cyndia Bent and being cleared for surgery. He denies any complaints at present or changes in his health or health history otherwise. Tolerated bowel prep with satifactory result. He states he is ready for surgery.  PMH: HTN, DM (on metformin), CAD, GERD, Iron deficiency anemia,  PSH: Denies  FHx: Father had a brain tumor behind his sinuses. Denies any known family history of colorectal, breast, endometrial or ovarian cancer  Social Hx:  Reformed smoker, quit 1997. Social EtOH use. Denies illicit drug use. Works at [employer]. He is here today with his wife.  Past Medical History:  Diagnosis Date   Arthralgia 01/11/2013   Diffuse, b/l Hips R>L Knees, ankles, low back    Arthritis    Back pain 12/08/2015   Cancer ([place])    Coronary artery disease    Diabetes mellitus type 2 in obese ([place]) 11/09/2012   Dyslipidemia 11/09/2012   Esophageal reflux 12/08/2015   History of migraine headaches    Hyperglycemia 11/09/2012   Hypertension     Past Surgical History:  Procedure Laterality Date   EUS N/A 06/21/2022   Procedure: LOWER ENDOSCOPIC ULTRASOUND (EUS);  Surgeon: Irving Copas., MD;  Location: [facility];  Service: Gastroenterology;  Laterality: N/A;   FLEXIBLE SIGMOIDOSCOPY N/A 06/21/2022   Procedure: FLEXIBLE SIGMOIDOSCOPY;  Surgeon: Rush Landmark Telford Nab., MD;  Location: [facility];  Service: Gastroenterology;  Laterality: N/A;    Family History  Problem Relation Age of Onset   Arthritis Mother    Hypertension Mother    Diabetes Mother    Cancer Mother    Arthritis Father    Hypertension Father    Diabetes Father    Sleep apnea Father    Obesity Father    Arthritis Maternal Grandmother    Diabetes Maternal Grandmother    Stroke Maternal Grandmother    Arthritis Paternal Grandmother    Diabetes Paternal Grandmother    Cancer Paternal Grandmother        lung, smoker   Diabetes Paternal Grandfather    Heart disease Paternal Grandfather 47       MI   Hyperlipidemia Brother    Hypertension Brother    Diabetes Brother    COPD Maternal Grandfather    Kidney disease Neg Hx     Social:  reports that he quit smoking about 27 years ago. His smoking use included cigarettes. He has a 15.00 pack-year smoking history. He has never used smokeless tobacco. He reports current alcohol use of about 6.0 - 10.0 standard drinks of alcohol per week. He reports current drug use. Drug: Marijuana.  Allergies:  Allergies  Allergen Reactions   Mucinex [Guaifenesin Er] Shortness Of Breath, Itching, Swelling, Dermatitis and Rash    Swelling of hands and feet   Nsaids Shortness Of Breath, Itching, Swelling and Rash    Aleve and Advil    Latex  Rash and Other (See Comments)    Cartilage will harden     Medications: I have reviewed the patient's current medications.  Results for orders placed or performed during the hospital encounter of 08/23/22 (from the past 48 hour(s))  Glucose, capillary     Status: Abnormal   Collection Time: 08/23/22  6:00 AM  Result Value Ref Range   Glucose-Capillary 141 (H) 70 - 99 mg/dL    Comment: Glucose reference range applies only to samples taken after fasting for at least 8 hours.    No results found.  ROS - all of the below systems have been reviewed with the patient and positives are indicated with bold text General: chills, fever or night sweats Eyes: blurry vision or double vision ENT: epistaxis or sore throat Allergy/Immunology: itchy/watery eyes or nasal congestion Hematologic/Lymphatic: bleeding problems, blood clots or swollen lymph nodes Endocrine: temperature intolerance or unexpected weight changes Breast: new or changing breast lumps or nipple discharge Resp: cough, shortness of breath, or wheezing CV: chest pain or dyspnea on exertion GI: as per HPI GU: dysuria, trouble voiding, or hematuria MSK: joint pain or joint stiffness Neuro: TIA or stroke symptoms Derm: pruritus and skin lesion changes  Psych: anxiety and depression  PE Blood pressure (!) 151/92, pulse 62, temperature 98.9 F (37.2 C), temperature source Oral, resp. rate 18, height '6\' 3"'$  (1.905 m), weight 126.2 kg, SpO2 94 %. Constitutional: NAD; conversant Eyes: Moist conjunctiva Lungs: Normal respiratory effort CV: RRR GI: Abd soft, NT/ND MSK: Normal range of motion of extremities Psychiatric: Appropriate affect  Results for orders placed or performed during the hospital encounter of 08/23/22 (from the past 48 hour(s))  Glucose, capillary     Status: Abnormal   Collection Time: 08/23/22  6:00 AM  Result Value Ref Range   Glucose-Capillary 141 (H) 70 - 99 mg/dL    Comment: Glucose reference range applies  only to samples taken after fasting for at least 8 hours.    No results found.   A/P: Robert [name] is an 52 y.o. male wwith hx of HTN, DM, GERD here for evaluation of newly diagnosed cmriT1/T2N0 rectal cancer - right lateral wall, mid rectum.   CT CAP 05/17/22 - Known primary not well visualized. No evidence of metastatic disease  Cmri 05/29/22 - T1/T2N0, 4.6 cm from IAS, 10.1 cm from  ceusT2N0 06/21/22 (Dr. Rush Landmark)  -The anatomy and physiology of the GI tract was reviewed with the patient. The pathophysiology of rectal cancer was discussed as well with associated pictures.  -We have discussed various different treatment options going forward including surgery (the most definitive) to address this -robotic assisted low anterior resection, flexible sigmoidoscopy to help elicit distal extent of mass, possible diverting loop ileostomy.  -We spent time risk seeing what ileostomy is and what this would entail as well as general postoperative expectations therein.  -The planned procedure, material risks (including, but not limited to, pain, bleeding, infection, scarring, need for blood transfusion, damage to surrounding structures- blood vessels/nerves/viscus/organs, damage to ureter, urine leak, leak from anastomosis, need for additional procedures, sexual dysfunction and retrograde ejaculation, scenarios where a stoma may be necessary and where it may be permanent, worsening of pre-existing medical conditions, hernia, recurrence, pneumonia, heart attack, stroke, death) benefits and alternatives to surgery were discussed at length. The patient's questions were answered to his satisfaction, he voiced understanding and elected to proceed with surgery. Additionally, we discussed typical postoperative expectations and the recovery process.   Nadeen Landau, [facility], [place]

## 2022-08-23 NOTE — Anesthesia Procedure Notes (Signed)
Procedure Name: Intubation Date/Time: 08/23/2022 7:49 AM  Performed by: West Pugh, CRNAPre-anesthesia Checklist: Patient identified, Emergency Drugs available, Suction available, Patient being monitored and Timeout performed Patient Re-evaluated:Patient Re-evaluated prior to induction Oxygen Delivery Method: Circle system utilized Preoxygenation: Pre-oxygenation with 100% oxygen Induction Type: IV induction Ventilation: Mask ventilation without difficulty Laryngoscope Size: Mac and 4 Grade View: Grade II Tube type: Oral Tube size: 7.5 mm Number of attempts: 1 Airway Equipment and Method: Stylet Placement Confirmation: ETT inserted through vocal cords under direct vision, positive ETCO2, CO2 detector and breath sounds checked- equal and bilateral Secured at: 22 cm Tube secured with: Tape Dental Injury: Teeth and Oropharynx as per pre-operative assessment

## 2022-08-24 ENCOUNTER — Encounter: Payer: Self-pay | Admitting: Hematology

## 2022-08-24 ENCOUNTER — Encounter (HOSPITAL_COMMUNITY): Payer: Self-pay | Admitting: Surgery

## 2022-08-24 ENCOUNTER — Other Ambulatory Visit (HOSPITAL_COMMUNITY): Payer: Self-pay

## 2022-08-24 DIAGNOSIS — Z932 Ileostomy status: Secondary | ICD-10-CM

## 2022-08-24 LAB — CBC
HCT: 35.2 % — ABNORMAL LOW (ref 39.0–52.0)
Hemoglobin: 10.8 g/dL — ABNORMAL LOW (ref 13.0–17.0)
MCH: 24.1 pg — ABNORMAL LOW (ref 26.0–34.0)
MCHC: 30.7 g/dL (ref 30.0–36.0)
MCV: 78.4 fL — ABNORMAL LOW (ref 80.0–100.0)
Platelets: 237 10*3/uL (ref 150–400)
RBC: 4.49 MIL/uL (ref 4.22–5.81)
RDW: 14.4 % (ref 11.5–15.5)
WBC: 12.7 10*3/uL — ABNORMAL HIGH (ref 4.0–10.5)
nRBC: 0 % (ref 0.0–0.2)

## 2022-08-24 LAB — BASIC METABOLIC PANEL
Anion gap: 9 (ref 5–15)
BUN: 8 mg/dL (ref 6–20)
CO2: 22 mmol/L (ref 22–32)
Calcium: 8.3 mg/dL — ABNORMAL LOW (ref 8.9–10.3)
Chloride: 106 mmol/L (ref 98–111)
Creatinine, Ser: 0.85 mg/dL (ref 0.61–1.24)
GFR, Estimated: >60 mL/min (ref >60)
Glucose, Bld: 124 mg/dL — ABNORMAL HIGH (ref 70–99)
Potassium: 3.9 mmol/L (ref 3.5–5.1)
Sodium: 137 mmol/L (ref 135–145)

## 2022-08-24 LAB — GLUCOSE, CAPILLARY
Glucose-Capillary: 144 mg/dL — ABNORMAL HIGH (ref 70–99)
Glucose-Capillary: 171 mg/dL — ABNORMAL HIGH (ref 70–99)
Glucose-Capillary: 155 mg/dL — ABNORMAL HIGH (ref 70–99)
Glucose-Capillary: 159 mg/dL — ABNORMAL HIGH (ref 70–99)

## 2022-08-24 MED ORDER — LOPERAMIDE HCL 2 MG PO CAPS: 2.0000 mg | ORAL_CAPSULE | Freq: Four times a day (QID) | ORAL | Status: DC | PRN

## 2022-08-24 MED ORDER — FERROUS SULFATE 325 (65 FE) MG PO TABS
325.0000 mg | ORAL_TABLET | Freq: Two times a day (BID) | ORAL | Status: DC
  Administered 2022-08-25 – 2022-08-26 (×3): 325 mg via ORAL
  Filled 2022-08-24 (×3): qty 1

## 2022-08-24 MED ORDER — TRAMADOL HCL 50 MG PO TABS
50.0000 mg | ORAL_TABLET | Freq: Four times a day (QID) | ORAL | 0 refills | Status: DC | PRN
  Filled 2022-08-24: qty 15, 4d supply, fill #0

## 2022-08-24 MED ORDER — LOPERAMIDE HCL 2 MG PO CAPS
2.0000 mg | ORAL_CAPSULE | Freq: Two times a day (BID) | ORAL | Status: DC
  Administered 2022-08-24 – 2022-08-26 (×4): 2 mg via ORAL
  Filled 2022-08-24 (×4): qty 1

## 2022-08-24 MED ORDER — CALCIUM POLYCARBOPHIL 625 MG PO TABS
625.0000 mg | ORAL_TABLET | Freq: Two times a day (BID) | ORAL | Status: DC
  Administered 2022-08-25 – 2022-08-26 (×3): 625 mg via ORAL
  Filled 2022-08-24 (×3): qty 1

## 2022-08-24 NOTE — Discharge Instructions (Signed)
POST OP INSTRUCTIONS AFTER COLON SURGERY  DIET: Be sure to include lots of fluids daily to stay hydrated - 64oz of water per day (8, 8 oz glasses).  Avoid fast food or heavy meals for the first couple of weeks as your are more likely to get nauseated. Avoid raw/uncooked fruits or vegetables for the first 4 weeks (its ok to have these if they are blended into smoothie form). If you have fruits/vegetables, make sure they are cooked until soft enough to mash on the roof of your mouth and chew your food well. Otherwise, diet as tolerated.  Take your usually prescribed home medications unless otherwise directed.  PAIN CONTROL: Pain is best controlled by a usual combination of three different methods TOGETHER: Ice/Heat Over the counter pain medication Prescription pain medication Most patients will experience some swelling and bruising around the surgical site.  Ice packs or heating pads (30-60 minutes up to 6 times a day) will help. Some people prefer to use ice alone, heat alone, alternating between ice & heat.  Experiment to what works for you.  Swelling and bruising can take several weeks to resolve.   It is helpful to take an over-the-counter pain medication regularly for the first few weeks: Ibuprofen (Motrin/Advil) - '200mg'$  tabs - take 3 tabs ('600mg'$ ) every 6 hours as needed for pain (unless you have been directed previously to avoid NSAIDs/ibuprofen) Acetaminophen (Tylenol) - you may take '650mg'$  every 6 hours as needed. You can take this with motrin as they act differently on the body. If you are taking a narcotic pain medication that has acetaminophen in it, do not take over the counter tylenol at the same time. NOTE: You may take both of these medications together - most patients  find it most helpful when alternating between the two (i.e. Ibuprofen at 6am, tylenol at 9am, ibuprofen at 12pm ..Marland Kitchen) A  prescription for pain medication should be given to you upon discharge.  Take your pain medication as  prescribed if your pain is not adequatly controlled with the over-the-counter pain reliefs mentioned above.  Avoid getting constipated.  Between the surgery and the pain medications, it is common to experience some constipation.  Increasing fluid intake and taking a fiber supplement (such as Metamucil, Citrucel, FiberCon, MiraLax, etc) 1-2 times a day regularly will usually help prevent this problem from occurring.  A mild laxative (prune juice, Milk of Magnesia, MiraLax, etc) should be taken according to package directions if there are no bowel movements after 48 hours.    Dressing: Your incisions are covered in Dermabond which is like sterile superglue for the skin. This will come off on it's own in a couple weeks. It is waterproof and you may bathe normally starting the day after your surgery in a shower. Avoid baths/pools/lakes/oceans until your wounds have fully healed. Ileostomy care: as instructed with our wound ostomy care team. Generally emptying when the pouch is ~2/3 full and 'burping' if accumulating gas. Output should be toothpaste consistence. Monitor your daily output and generally this should be < 1.2 L in 24 hrs. If it is > 1.2 L/24 hrs, please let us know. Imodium is often used to slow this down - starting 2 mg twice daily. Fiber tablets (over the counter, fibercon etc) are a good way to keep the output 'thicker' and can be taken twice daily.  ACTIVITIES as tolerated:   Avoid heavy lifting (>10lbs or 1 gallon of milk) for the next 6 weeks. You may resume regular daily activities  as tolerated--such as daily self-care, walking, climbing stairs--gradually increasing activities as tolerated.  If you can walk 30 minutes without difficulty, it is safe to try more intense activity such as jogging, treadmill, bicycling, low-impact aerobics.  DO NOT PUSH THROUGH PAIN.  Let pain be your guide: If it hurts to do something, don't do it. You may drive when you are no longer taking prescription pain  medication, you can comfortably wear a seatbelt, and you can safely maneuver your car and apply brakes.  FOLLOW UP in our office Please call [facility] at [phone] to set up an appointment to see your surgeon in the office for a follow-up appointment approximately 2 weeks after your surgery. Make sure that you call for this appointment the day you arrive home to insure a convenient appointment time.  9. If you have disability or family leave forms that need to be completed, you may have them completed by your primary care physician's office; for return to work instructions, please ask our office staff and they will be happy to assist you in obtaining this documentation   When to call us [phone]: Poor pain control Reactions / problems with new medications (rash/itching, etc)  Fever over 101.5 F (38.5 C) Inability to urinate Nausea/vomiting Worsening swelling or bruising Continued bleeding from incision. Increased pain, redness, or drainage from the incision  The clinic staff is available to answer your questions during regular business hours (8:30am-5pm).  Please don't hesitate to call and ask to speak to one of our nurses for clinical concerns.   A surgeon from [facility] is always on call at the hospitals   If you have a medical emergency, go to the nearest emergency room or call 911.  [facility], [place] [address] [place], [place], [place]  [address] MAIN: [phone] FAX: [phone] [email or web address]   #######################################################  Ostomy Support Information  You've heard that people get along just fine with only one of their eyes, or one of their lungs, or one of their kidneys. But you also know that you have only one intestine and only one bladder, and that leaves you feeling awfully empty, both physically and emotionally: You think no other people go around without part of their intestine  with the ends of their intestines sticking out through their abdominal walls.   YOU ARE NOT ALONE.  There are nearly three quarters of a million people in the [place] who have an ostomy; people who have had surgery to remove all or part of their colons or bladders.   There is even a national association, the [place] [employer] [place] with over 350 local affiliated support groups that are organized by volunteers who provide peer support and counseling. [place] has a toll free telephone num-ber, [phone] and an educational, interactive website, www.ostomy.org   An ostomy is an opening in the belly (abdominal wall) made by surgery. Ostomates are people who have had this procedure. The opening (stoma) allows the kidney or bowel to grdischarge waste. An external pouch covers the stoma to collect waste. Pouches are are a simple bag and are odor free. Different companies have disposable or reusable pouches to fit one's lifestyle. An ostomy can either be temporary or permanent.   THERE ARE THREE MAIN TYPES OF OSTOMIES Colostomy. A colostomy is a surgically created opening in the large intestine (colon). Ileostomy. An ileostomy is a surgically created opening in the small intestine. Urostomy. A urostomy is a surgically created opening  to divert urine away from the bladder.  OSTOMY Care  The following guidelines will make care of your colostomy easier. Keep this information close by for quick reference.  Helpful DIET hints Eat a well-balanced diet including vegetables and fresh fruits. Eat on a regular schedule.  Drink at least 6 to 8 glasses of fluids daily. Eat slowly in a relaxed atmosphere. Chew your food thoroughly. Avoid chewing gum, smoking, and drinking from a straw. This will help decrease the amount of air you swallow, which may help reduce gas. Eating yogurt or drinking buttermilk may help reduce gas.  To control gas at night, do not eat after 8 p.m. This will give your bowel time to  quiet down before you go to bed.  If gas is a problem, you can purchase Beano. Sprinkle Beano on the first bite of food before eating to reduce gas. It has no flavor and should not change the taste of your food. You can buy Beano over the counter at your local drugstore.  Foods like fish, onions, garlic, broccoli, asparagus, and cabbage produce odor. Although your pouch is odor-proof, if you eat these foods you may notice a stronger odor when emptying your pouch. If this is a concern, you may want to limit these foods in your diet.  If you have an ileostomy, you will have chronic diarrhea & need to drink more liquids to avoid getting dehydrated.  Consider antidiarrheal medicine like imodium (loperamide) or Lomotil to help slow down bowel movements / diarrhea into your ileostomy bag.  GETTING TO GOOD BOWEL HEALTH WITH AN ILEOSTOMY    With the colon bypassed & not in use, you will have small bowel diarrhea.   It is important to thicken & slow your bowel movements down.   The goal: 4-6 small BOWEL MOVEMENTS A DAY It is important to drink plenty of liquids to avoid getting dehydrated  CONTROLLING ILEOSTOMY DIARRHEA  TAKE A FIBER SUPPLEMENT (FiberCon or Benefiner soluble fiber) twice a day - to thicken stools by absorbing excess fluid and retrain the intestines to act more normally.  Slowly increase the dose over a few weeks.  Too much fiber too soon can backfire and cause cramping & bloating.  TAKE AN IRON SUPPLEMENT twice a day to naturally constipate your bowels.  Usually ferrous sulfate '325mg'$  twice a day)  TAKE ANTI-DIARRHEAL MEDICINES: Loperamide (Imodium) can slow down diarrhea.  Start with two tablets (= '4mg'$ ) first and then try one tablet every 6 hours.  Can go up to 2 pills four times day (8 pills of '2mg'$  max) Avoid if you are having fevers or severe pain.  If you are not better or start feeling worse, stop all medicines and call your doctor for advice LoMotil (Diphenoxylate / Atropine) is  another medicine that can constipate & slow down bowel moevements Pepto Bismol (bismuth) can gently thicken bowels as well  If diarrhea is worse,: drink plenty of liquids and try simpler foods for a few days to avoid stressing your intestines further. Avoid dairy products (especially milk & ice cream) for a short time.  The intestines often can lose the ability to digest lactose when stressed. Avoid foods that cause gassiness or bloating.  Typical foods include beans and other legumes, cabbage, broccoli, and dairy foods.  Every person has some sensitivity to other foods, so listen to our body and avoid those foods that trigger problems for you.Call your doctor if you are getting worse or not better.  Sometimes further testing (  cultures, endoscopy, X-ray studies, bloodwork, etc) may be needed to help diagnose and treat the cause of the diarrhea. Take extra anti-diarrheal medicines (maximum is 8 pills of '2mg'$  loperamide a day)   Tips for POUCHING an OSTOMY   Changing Your Pouch The best time to change your pouch is in the morning, before eating or drinking anything. Your stoma can function at any time, but it will function more after eating or drinking.   Applying the pouching system  Place all your equipment close at hand before removing your pouch.  Wash your hands.  Stand or sit in front of a mirror. Use the position that works best for you. Remember that you must keep the skin around the stoma wrinkle-free for a good seal.  Gently remove the used pouch (1-piece system) or the pouch and old wafer (2-piece system). Empty the pouch into the toilet. Save the closure clip to use again.  Wash the stoma itself and the skin around the stoma. Your stoma may bleed a little when being washed. This is normal. Rinse and pat dry. You may use a wash cloth or soft paper towels (like Bounty), mild soap (like Dial, Safeguard, or [place]), and water. Avoid soaps that contain perfumes or lotions.  For a new  pouch (1-piece system) or a new wafer (2-piece system), measure your stoma using the stoma guide in each box of supplies.  Trace the shape of your stoma onto the back of the new pouch or the back of the new wafer. Cut out the opening. Remove the paper backing and set it aside.  Optional: Apply a skin barrier powder to surrounding skin if it is irritated (bare or weeping), and dust off the excess. Optional: Apply a skin-prep wipe (such as Skin Prep or All-Kare) to the skin around the stoma, and let it dry. Do not apply this solution if the skin is irritated (red, tender, or broken) or if you have shaved around the stoma. Optional: Apply a skin barrier paste (such as Stomahesive, Coloplast, or Premium) around the opening cut in the back of the pouch or wafer. Allow it to dry for 30 to 60 seconds.  Hold the pouch (1-piece system) or wafer (2-piece system) with the sticky side toward your body. Make sure the skin around the stoma is wrinkle-free. Center the opening on the stoma, then press firmly to your abdomen (Fig. 4). Look in the mirror to check if you are placing the pouch, or wafer, in the right position. For a 2-piece system, snap the pouch onto the wafer. Make sure it snaps into place securely.  Place your hand over the stoma and the pouch or wafer for about 30 seconds. The heat from your hand can help the pouch or wafer stick to your skin.  Add deodorant (such as Super Banish or Nullo) to your pouch. Other options include food extracts such as vanilla oil and peppermint extract. Add about 10 drops of the deodorant to the pouch. Then apply the closure clamp. Note: Do not use toxic  chemicals or commercial cleaning agents in your pouch. These substances may harm the stoma.  Optional: For extra seal, apply tape to all 4 sides around the pouch or wafer, as if you were framing a picture. You may use any brand of medical adhesive tape. Change your pouch every 5 to 7 days. Change it immediately if a  leak occurs.  Wash your hands afterwards.  If you are wearing a 2-piece system, you may use  2 new pouches per week and alternate them. Rinse the pouch with mild soap and warm water and hang it to dry for the next day. Apply the fresh pouch. Alternate the 2 pouches like this for a week. After a week, change the wafer and begin with 2 new pouches. Place the old pouches in a plastic bag, and put them in the trash.   LIVING WITH AN OSTOMY  Emptying Your Pouch Empty your pouch when it is one-third full (of urine, stool, and/or gas). If you wait until your pouch is fuller than this, it will be more difficult to empty and more noticeable. When you empty your pouch, either put toilet paper in the toilet bowl first, or flush the toilet while you empty the pouch. This will reduce splashing. You can empty the pouch between your legs or to one side while sitting, or while standing or stooping. If you have a 2-piece system, you can snap off the pouch to empty it. Remember that your stoma may function during this time. If you wish to rinse your pouch after you empty it, a [place] baster can be helpful. When using a baster, squirt water up into the pouch through the opening at the bottom. With a 2-piece system, you can snap off the pouch to rinse it. After rinsing  your pouch, empty it into the toilet. When rinsing your pouch at home, put a few granules of Dreft soap in the rinse water. This helps lubricate and freshen your pouch. The inside of your pouch can be sprayed with non-stick cooking oil (Pam spray). This may help reduce stool sticking to the inside of the pouch.  Bathing You may shower or bathe with your pouch on or off. Remember that your stoma may function during this time.  The materials you use to wash your stoma and the skin around it should be clean, but they do not need to be sterile.  Wearing Your Pouch During hot weather, or if you perspire a lot in general, wear a cover over your pouch. This  may prevent a rash on your skin under the pouch. Pouch covers are sold at ostomy supply stores. Wear the pouch inside your underwear for better support. Watch your weight. Any gain or loss of 10 to 15 pounds or more can change the way your pouch fits.  Going Away From Home A collapsible cup (like those that come in travel kits) or a soft plastic squirt bottle with a pull-up top (like a travel bottle for shampoo) can be used for rinsing your pouch when you are away from home. Tilt the opening of the pouch at an upward angle when using a cup to rinse.  Carry wet wipes or extra tissues to use in public bathrooms.  Carry an extra pouching system with you at all times.  Never keep ostomy supplies in the glove compartment of your car. Extreme heat or cold can damage the skin barriers and adhesive wafers on the pouch.  When you travel, carry your ostomy supplies with you at all times. Keep them within easy reach. Do not pack ostomy supplies in baggage that will be checked or otherwise separated from you, because your baggage might be lost. If you're traveling out of the country, it is helpful to have a letter stating that you are carrying ostomy supplies as a medical necessity.  If you need ostomy supplies while traveling, look in the yellow pages of the telephone book under "Surgical Supplies." Or call the  local ostomy organization to find out where supplies are available.  Do not let your ostomy supplies get low. Always order new pouches before you use the last one.  Reducing Odor Limit foods such as broccoli, cabbage, onions, fish, and garlic in your diet to help reduce odor. Each time you empty your pouch, carefully clean the opening of the pouch, both inside and outside, with toilet paper. Rinse your pouch 1 or 2 times daily after you empty it (see directions for emptying your pouch and going away from home). Add deodorant (such as Super Banish or Nullo) to your pouch. Use air deodorizers in  your bathroom. Do not add aspirin to your pouch. Even though aspirin can help prevent odor, it could cause ulcers on your stoma.  When to call the doctor Call the doctor if you have any of the following symptoms: Purple, black, or white stoma Severe cramps lasting more than 6 hours Severe watery discharge from the stoma lasting more than 6 hours No output from the colostomy for 3 days Excessive bleeding from your stoma Swelling of your stoma to more than 1/2-inch larger than usual Pulling inward of your stoma below skin level Severe skin irritation or deep ulcers Bulging or other changes in your abdomen  When to call your ostomy nurse Call your ostomy/enterostomal therapy (WOCN) nurse if any of the following occurs: Frequent leaking of your pouching system Change in size or appearance of your stoma, causing discomfort or problems with your pouch Skin rash or rawness Weight gain or loss that causes problems with your pouch     FREQUENTLY ASKED QUESTIONS   Why haven't you met any of these folks who have an ostomy?  Well, maybe you have! You just did not recognize them because an ostomy doesn't show. It can be kept secret if you wish. Why, maybe some of your best friends, office associates or neighbors have an ostomy ... you never can tell. People facing ostomy surgery have many quality-of-life questions like: Will you bulge? Smell? Make noises? Will you feel waste leaving your body? Will you be a captive of the toilet? Will you starve? Be a social outcast? Get/stay married? Have babies? Easily bathe, go swimming, bend over?  OK, let's look at what you can expect:   Will you bulge?  Remember, without part of the intestine or bladder, and its contents, you should have a flatter tummy than before. You can expect to wear, with little exception, what you wore before surgery ... and this in-cludes tight clothing and bathing suits.   Will you smell?  Today, thanks to modern odor proof  pouching systems, you can walk into an ostomy support group meeting and not smell anything that is foul or offensive. And, for those with an ileostomy or colostomy who are concerned about odor when emptying their pouch, there are in-pouch deodorants that can be used to eliminate any waste odors that may exist.   Will you make noises?  Everyone produces gas, especially if they are an air-swallower. But intestinal sounds that occur from time to time are no differ-ent than a gurgling tummy, and quite often your clothing will muffle any sounds.   Will you feel the waste discharges?  For those with a colostomy or ileostomy there might be a slight pressure when waste leaves your body, but understand that the intestines have no nerve endings, so there will be no unpleasant sensations. Those with a urostomy will probably be unaware of any kidney drainage.  Will you be a captive of the toilet?  Immediately post-op you will spend more time in the bathroom than you will after your body recovers from surgery. Every person is different, but on average those with an ileostomy or urostomy may empty their pouches 4 to 6 times a day; a little  less if you have a colostomy. The average wear time between pouch system changes is 3 to 5 days and the changing process should take less than 30 minutes.   Will I need to be on a special diet? Most people return to their normal diet when they have recovered from surgery. Be sure to chew your food well, eat a well-balanced diet and drink plenty of fluids. If you experience problems with a certain food, wait a couple of weeks and try it again.  Will there be odor and noises? Pouching systems are designed to be odor-proof or odor-resistant. There are deodorants that can be used in the pouch. Medications are also available to help reduce odor. Limit gas-producing foods and carbonated beverages. You will experience less gas and fewer noises as you heal from surgery.  How much  time will it take to care for my ostomy? At first, you may spend a lot of time learning about your ostomy and how to take care of it. As you become more comfortable and skilled at changing the pouching system, it will take very little time to care for it.   Will I be able to return to work? People with ostomies can perform most jobs. As soon as you have healed from surgery, you should be able to return to work. Heavy lifting (more than 10 pounds) may be discouraged.   What about intimacy? Sexual relationships and intimacy are important and fulfilling aspects of your life. They should continue after ostomy surgery. Intimacy-related concerns should be discussed openly between you and your partner.   Can I wear regular clothing? You do not need to wear special clothing. Ostomy pouches are fairly flat and barely noticeable. Elastic undergarments will not hurt the stoma or prevent the ostomy from functioning.   Can I participate in sports? An ostomy should not limit your involvement in sports. Many people with ostomies are runners, skiers, swimmers or participate in other active lifestyles. Talk with your caregiver first before doing heavy physical activity.  Will you starve?  Not if you follow doctor's orders at each stage of your post-op adjustment. There is no such thing as an "ostomy diet". Some people with an ostomy will be able to eat and tolerate anything; others may find diffi-culty with some foods. Each person is an individual and must determine, by trial, what is best for them. A good practice for all is to drink plenty of water.   Will you be a social outcast?  Have you met anyone who has an ostomy and is a social outcast? Why should you be the first? Only your attitude and self image will effect how you are treated. No confi-dent person is an Occupational psychologist.    PROFESSIONAL HELP   Resources are available if you need help or have questions about your ostomy.   Specially trained nurses called  Wound, Ostomy Continence Nurses (WOCN) are available for consultation in most major medical centers.  Consider getting an ostomy consult at an outpatient ostomy clinic.   [place] has an [facility] run by an Programmer, systems at the [place].  [phone]. [facility] can help set up  an appointment   The [employer] (UOA) is a group made up of many local chapters throughout the [place]. These local groups hold meetings and provide support to prospective and existing ostomates. They sponsor educational events and have qualified visitors to make personal or telephone visits. Contact the UOA for the chapter nearest you and for other educational publications.  More detailed information can be found in Colostomy Guide, a publication of the [employer] ([employer]). Contact [employer] at [phone] or visit their web site at [email or web address]/. The website contains links to other sites, suppliers and resources.  Tree [employer] Start Services: Start at the website to enlist for support.  Your Wound Ostomy (WOCN) nurse may have started this process. [email or web address].com/en/securestart Secure Start services are designed to support people as they live their lives with an ostomy or neurogenic bladder. Enrolling is easy and at no cost to the patient. We realize that each person's needs and life journey are different. Through Secure Start services, we want to help people live their life, their way.  #######################################################

## 2022-08-24 NOTE — Progress Notes (Signed)
  Subjective No acute events. Feeling well. Getting up for first time this morning. Ileostomy working well. Tolerating liquids without issue.  Objective: Vital signs in last 24 hours: Temp:  [97.7 F (36.5 C)-98.8 F (37.1 C)] 98.8 F (37.1 C) (01/12 0454) Pulse Rate:  [55-81] 66 (01/12 0454) Resp:  [10-20] 16 (01/12 0454) BP: (125-161)/(78-98) 140/78 (01/12 0454) SpO2:  [92 %-99 %] 99 % (01/12 0454) Last BM Date : 08/23/22  Intake/Output from previous day: 01/11 0701 - 01/12 0700 In: 4168.8 [P.O.:900; I.V.:3168.8; IV Piggyback:100] Out: 3800 [Urine:3550; Stool:200; Blood:50] Intake/Output this shift: No intake/output data recorded.  Gen: NAD, comfortable CV: RRR Pulm: Normal work of breathing Abd: Soft, NT/ND, ileostomy pink and productive Ext: SCDs in place  Lab Results: CBC  Recent Labs    08/23/22 1329 08/24/22 0439  WBC 13.7* 12.7*  HGB 11.2* 10.8*  HCT 37.1* 35.2*  PLT 212 237   BMET Recent Labs    08/23/22 1329 08/24/22 0439  NA  --  137  K  --  3.9  CL  --  106  CO2  --  22  GLUCOSE  --  124*  BUN  --  8  CREATININE 1.18 0.85  CALCIUM  --  8.3*   PT/INR No results for input(s): "LABPROT", "INR" in the last 72 hours. ABG No results for input(s): "PHART", "HCO3" in the last 72 hours.  Invalid input(s): "PCO2", "PO2"  Studies/Results:  Anti-infectives: Anti-infectives (From admission, onward)    Start     Dose/Rate Route Frequency Ordered Stop   08/23/22 0700  cefoTEtan (CEFOTAN) 2 g in sodium chloride 0.9 % 100 mL IVPB        2 g 200 mL/hr over 30 Minutes Intravenous On call to O.R. 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   Rectal cancer ([place]) 05/23/2022   Plantar wart of left foot 12/05/2020    Blood in stool 12/05/2020   Feeling grief 06/09/2020   Neck pain 06/09/2020   Bilateral hand pain 06/09/2020   Esophageal reflux 12/08/2015   Back pain 12/08/2015   Morbid obesity ([place]) 01/27/2014   Arthralgia 01/11/2013   Hyperlipidemia LDL goal <70 11/09/2012   Diabetes mellitus type 2 in obese ([place]) 11/09/2012   Colon cancer screening 05/31/2012   HTN (hypertension) 04/25/2012   s/p Procedure(s): XI ROBOTIC ASSISTED LOWER ANTERIOR RESECTION with Introperative assessment of profusion and TAP Block FLEXIBLE SIGMOIDOSCOPY DIVERTING LOOP ILEOSTOMY 08/23/2022  -Advance to carb modified diet as tolerated -Cont MIVF -Ambulate 5x/day -PT/OT -WOCN to see today -Ppx: SQH, SCDs  We spent time reviewing his procedure, findings, plans and answering questions. General postoperative expectations have been reviewed as well.   LOS: 1 day   Nadeen Landau, MD [facility], [place]

## 2022-08-24 NOTE — Consult Note (Signed)
[place] Nurse ostomy consult note Stoma type/location: RLQ, loop ileostomy  Stomal assessment/size: 1 3/4" budded, but appears to have os at skin level. No support bridge in place, verified in OP note as well.  Peristomal assessment: NA Treatment options for stomal/peristomal skin: NA Output 200 cc liquid green emptied and reported to bedside nurse  Ostomy pouching: 1pc.convex in place from pouch change just prior to my arrival, has had 2 pouch leaks since returning from OR, both were flat pouching systems.  Bedside nurse decided to try convex pouch which was a good choice but patient may end up needing soft convex due to the contour of the abdomen. Discussed with bedside nurse and with patient and his wife. Giving rationale to both for use of this pouch Education provided:  Explained role of ostomy nurse and creation of stoma  Explained stoma characteristics (budded, flush, color, texture, care) Education on emptying when 1/3 to 1/2 full and how to empty Demonstrated emptying and using wick to clean spout Discussed bathing, diet, gas, medication use,diarrhea, dehydration Patient using XR medication at home, made patient and wife aware of need to discuss with pharmacist, may need regular strength until stoma reversed and rationale for this.  Discussed food blockage   Discussed risk of peristomal hernia; dicussed patient's current work. 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  Answered patient/family questions; wife engaged to learn and assist with care. Encouraged patient to begin to empty pouch over the weekend either while sitting on the side of the bed or going into the restroom    Enrolled patient in [place] program: Yes  [place] Nurse will follow along with you for continued support  with ostomy teaching and care Tracy MSN, RN, [place], [place], [place]

## 2022-08-24 NOTE — Evaluation (Signed)
Occupational Therapy Evaluation Patient Details Name: Robert [name] MRN: [id number] DOB: November 04, 1970 Today's Date: 08/24/2022   History of Present Illness Patient s/p DIVERTING LOOP ILEOSTOMY on 08/23/2022   Clinical Impression   Robert [name] is a 52 year old woman who presents with pain s/p abdominal surgery and with presence of new ostomy. On evaluation he is able to mobilize without physical assistance and needing min-mod assist for LB ADLs due to pain. Patient has assistance of wife at discharge. Expect once pain improves patient will become independent with ADLs. No Ot needs at this time.      Recommendations for follow up therapy are one component of a multi-disciplinary discharge planning process, led by the attending physician.  Recommendations may be updated based on patient status, additional functional criteria and insurance authorization.   Follow Up Recommendations  No OT follow up     Assistance Recommended at Discharge Intermittent Supervision/Assistance  Patient can return home with the following A little help with bathing/dressing/bathroom;Assistance with cooking/housework    Functional Status Assessment  Patient has had a recent decline in their functional status and demonstrates the ability to make significant improvements in function in a reasonable and predictable amount of time.  Equipment Recommendations  None recommended by OT    Recommendations for Other Services       Precautions / Restrictions Precautions Precaution Comments: ostomy Restrictions Weight Bearing Restrictions: No      Mobility Bed Mobility Overal bed mobility: Modified Independent             General bed mobility comments: increased time secondary to pain    Transfers Overall transfer level: Modified independent Equipment used: None               General transfer comment: min guard for safety and assessment purposes but no physical assistance required       Balance Overall balance assessment: Mild deficits observed, not formally tested                                         ADL either performed or assessed with clinical judgement   ADL Overall ADL's : Needs assistance/impaired Eating/Feeding: Independent   Grooming: Independent   Upper Body Bathing: Independent   Lower Body Bathing: Minimal assistance   Upper Body Dressing : Independent   Lower Body Dressing: Moderate assistance   Toilet Transfer: Independent   Toileting- Clothing Manipulation and Hygiene: Total assistance Toileting - Clothing Manipulation Details (indicate cue type and reason): technically total assist due to presence of catheter and ostomyy -- but exhibits physical abilities to perfomr     Functional mobility during ADLs: Min guard;Supervision/safety       Vision Patient Visual Report: No change from baseline       Perception     Praxis      Pertinent Vitals/Pain Pain Assessment Pain Assessment: Faces Faces Pain Scale: Hurts little more Pain Location: low abdomen Pain Descriptors / Indicators: Burning, Grimacing Pain Intervention(s): Monitored during session, Ice applied     Hand Dominance     Extremity/Trunk Assessment Upper Extremity Assessment Upper Extremity Assessment: Overall WFL for tasks assessed   Lower Extremity Assessment Lower Extremity Assessment: Overall WFL for tasks assessed   Cervical / Trunk Assessment Cervical / Trunk Assessment: Normal   Communication Communication Communication: No difficulties   Cognition Arousal/Alertness: Awake/alert Behavior During Therapy: WFL for tasks assessed/performed Overall  Cognitive Status: Within Functional Limits for tasks assessed                                       General Comments       Exercises     Shoulder Instructions      Home Living Family/patient expects to be discharged to:: Private residence Living Arrangements:  Spouse/significant other Available Help at Discharge: Family Type of Home: House       Home Layout: Two level     Bathroom Shower/Tub: Occupational psychologist: [place]: None          Prior Functioning/Environment Prior Level of Function : Independent/Modified Independent                        OT Problem List: Pain      OT Treatment/Interventions:      OT Goals(Current goals can be found in the care plan section) Acute Rehab OT Goals OT Goal Formulation: All assessment and education complete, DC therapy  OT Frequency:      Co-evaluation              AM-PAC OT "6 Clicks" Daily Activity     Outcome Measure Help from another person eating meals?: None Help from another person taking care of personal grooming?: None Help from another person toileting, which includes using toliet, bedpan, or urinal?: Total (catheter, ostomy) Help from another person bathing (including washing, rinsing, drying)?: A Little Help from another person to put on and taking off regular upper body clothing?: None Help from another person to put on and taking off regular lower body clothing?: A Lot 6 Click Score: 18   End of Session Nurse Communication: Mobility status  Activity Tolerance: Patient tolerated treatment well Patient left: in bed;with call bell/phone within reach;with family/visitor present  OT Visit Diagnosis: Pain                Time: 5784-6962 OT Time Calculation (min): 15 min Charges:  OT General Charges $OT Visit: 1 Visit OT Evaluation $OT Eval Low Complexity: 1 Low  Robert [name], OTR/L Robert [name]  Office [phone]   Robert [name] 08/24/2022, 9:56 AM

## 2022-08-24 NOTE — TOC Initial Note (Signed)
Transition of Care [facility]) - Initial/Assessment Note   Patient Details  Name: Robert [name] MRN: [id number] Date of Birth: October 10, 1970  Transition of Care [facility]) CM/SW Contact:    Sherie Don, LCSW Phone Number: 08/24/2022, 1:51 PM  Clinical Narrative: [facility] consulted for [facility] as patient has new ostomy. CSW met with patient to discuss referral. Patient agreeable to [facility] referral being made. CSW made referrals to the following agencies:  [facility]: out of network [facility]: declined [facility]: declined [facility]: declined [facility]: accepted  Referral information sent to [place] with [facility]. CSW updated patient.  Expected Discharge Plan: [place] Barriers to Discharge: No Barriers Identified  Patient Goals and CMS Choice Patient states their goals for this hospitalization and ongoing recovery are:: Return home CMS Medicare.gov Compare Post Acute Care list provided to:: Patient Choice offered to / list presented to : Patient  Expected Discharge Plan and Services In-house Referral: Clinical Social Work Living arrangements for the past 2 months: Single Family Home              DME Arranged: N/A DME Agency: NA HH Arranged: RN [place] Agency: Other - See comment ([place]) Date [place]: 08/24/22 Time [place]: 83 Representative spoke with at [place]: [place] Arrangements/Services Living arrangements for the past 2 months: [place] Lives with:: Spouse Patient language and need for interpreter reviewed:: Yes Do you feel safe going back to the place where you live?: Yes      Need for Family Participation in Patient Care: No (Comment) Care giver support system in place?: Yes (comment) Criminal Activity/Legal Involvement Pertinent to Current Situation/Hospitalization: No - Comment as needed  Activities of Daily Living Home Assistive Devices/Equipment: None ADL Screening (condition at time of admission) Patient's  cognitive ability adequate to safely complete daily activities?: Yes Is the patient deaf or have difficulty hearing?: No Does the patient have difficulty seeing, even when wearing glasses/contacts?: No Does the patient have difficulty concentrating, remembering, or making decisions?: No Patient able to express need for assistance with ADLs?: Yes Does the patient have difficulty dressing or bathing?: No Independently performs ADLs?: Yes (appropriate for developmental age) Does the patient have difficulty walking or climbing stairs?: No Weakness of Legs: None Weakness of Arms/Hands: None  Permission Sought/Granted Permission sought to share information with : Other (comment) Permission granted to share information with : Yes, Verbal Permission Granted Permission granted to share info w AGENCY: [place] agencies  Emotional Assessment Appearance:: Appears stated age Attitude/Demeanor/Rapport: Engaged Affect (typically observed): Accepting, Appropriate Orientation: : Oriented to Self, Oriented to Place, Oriented to  Time, Oriented to Situation Alcohol / Substance Use: Not Applicable Psych Involvement: No (comment)  Admission diagnosis:  S/P robot-assisted surgical procedure [Z98.890] Patient Active Problem List   Diagnosis Date Noted   S/P robot-assisted surgical procedure 08/23/2022   CAD (coronary artery disease) 06/20/2022   LVH (left ventricular hypertrophy) 06/19/2022   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   Rectal cancer ([place]) 05/23/2022   Plantar wart of left foot 12/05/2020   Blood in stool 12/05/2020   Feeling grief 06/09/2020   Neck pain 06/09/2020   Bilateral hand pain 06/09/2020   Esophageal reflux 12/08/2015   Back pain 12/08/2015   Morbid obesity ([place]) 01/27/2014   Arthralgia 01/11/2013   Hyperlipidemia LDL goal <70 11/09/2012   Diabetes mellitus type 2 in obese ([place]) 11/09/2012   Colon cancer screening 05/31/2012   HTN (hypertension) 04/25/2012   PCP:  [name],  MD Pharmacy:   [id number] [place], [id number]: [phone]: [phone] Social Determinants of Health (SDOH) Social History: SDOH Screenings   Depression (PHQ2-9): Low Risk  (06/19/2022)  Tobacco Use: Medium Risk (08/24/2022)   SDOH Interventions:    Readmission Risk Interventions     No data to display

## 2022-08-24 NOTE — Progress Notes (Signed)
   PT Cancellation Note  Patient Details Name: Robert [name] MRN: [id number] DOB: [birth year]   Cancelled Treatment:    Reason Eval/Treat Not Completed: PT screened, no needs identified, will sign off  Patient ambulating with staff and family ans should progress well. [place] Office [phone] Weekend [phone]  Claretha Cooper 08/24/2022, 9:39 AM

## 2022-08-25 LAB — BASIC METABOLIC PANEL
Anion gap: 6 (ref 5–15)
BUN: 11 mg/dL (ref 6–20)
CO2: 27 mmol/L (ref 22–32)
Calcium: 8.6 mg/dL — ABNORMAL LOW (ref 8.9–10.3)
Chloride: 103 mmol/L (ref 98–111)
Creatinine, Ser: 1.08 mg/dL (ref 0.61–1.24)
GFR, Estimated: >60 mL/min (ref >60)
Glucose, Bld: 179 mg/dL — ABNORMAL HIGH (ref 70–99)
Potassium: 3.7 mmol/L (ref 3.5–5.1)
Sodium: 136 mmol/L (ref 135–145)

## 2022-08-25 LAB — CBC
HCT: 36.1 % — ABNORMAL LOW (ref 39.0–52.0)
Hemoglobin: 11 g/dL — ABNORMAL LOW (ref 13.0–17.0)
MCH: 24.4 pg — ABNORMAL LOW (ref 26.0–34.0)
MCHC: 30.5 g/dL (ref 30.0–36.0)
MCV: 80.2 fL (ref 80.0–100.0)
Platelets: 216 10*3/uL (ref 150–400)
RBC: 4.5 MIL/uL (ref 4.22–5.81)
RDW: 14.6 % (ref 11.5–15.5)
WBC: 9.3 10*3/uL (ref 4.0–10.5)
nRBC: 0 % (ref 0.0–0.2)

## 2022-08-25 LAB — GLUCOSE, CAPILLARY
Glucose-Capillary: 104 mg/dL — ABNORMAL HIGH (ref 70–99)
Glucose-Capillary: 152 mg/dL — ABNORMAL HIGH (ref 70–99)
Glucose-Capillary: 164 mg/dL — ABNORMAL HIGH (ref 70–99)
Glucose-Capillary: 142 mg/dL — ABNORMAL HIGH (ref 70–99)

## 2022-08-25 MED ORDER — TRAMADOL HCL 50 MG PO TABS
50.0000 mg | ORAL_TABLET | Freq: Four times a day (QID) | ORAL | Status: DC | PRN
  Administered 2022-08-25: 100 mg via ORAL
  Administered 2022-08-26: 50 mg via ORAL
  Administered 2022-08-26: 100 mg via ORAL
  Administered 2022-08-26: 50 mg via ORAL
  Filled 2022-08-25 (×3): qty 2
  Filled 2022-08-25: qty 1
  Filled 2022-08-25: qty 2

## 2022-08-25 NOTE — Progress Notes (Signed)
PHARMACIST - PHYSICIAN COMMUNICATION  CONCERNING: Alvimopan   RECOMMENDATION: This patient is receiving alvimopan post-operatively.  Based on criteria approved by the Pharmacy and Therapeutics Committee, the medication will be discontinued.  DESCRIPTION: These criteria include: Patient will receive NO MORE than 15 doses total during current hospitalization If bowel recovery (documented return of bowel sounds and a bowel movement confirmed by RN or patient) occurs before completion of 7 days of therapy, a pharmacist may discontinue alvimopan  If you have questions about this conversion, please contact the [place], PharmD, BCPS Clinical Pharmacist 08/25/2022 6:49 AM

## 2022-08-25 NOTE — Progress Notes (Signed)
2 Days Post-Op   Subjective/Chief Complaint: Tolerating carb modified Has emptied his pouch but hasn't changed bag Some burning in lower abd at times with certain mvmts Ostomy 1.5L    Objective: Vital signs in last 24 hours: Temp:  [98.3 F (36.8 C)-99 F (37.2 C)] 98.9 F (37.2 C) (01/13 0845) Pulse Rate:  [59-69] 63 (01/13 0845) Resp:  [16-17] 16 (01/13 0428) BP: (122-150)/(74-87) 140/80 (01/13 0845) SpO2:  [90 %-93 %] 90 % (01/13 0845) Weight:  [126.1 kg] 126.1 kg (01/13 0428) Last BM Date : 08/24/22  Intake/Output from previous day: 01/12 0701 - 01/13 0700 In: 2031.3 [P.O.:900; I.V.:1131.3] Out: 66 [Urine:4100; Stool:1510] Intake/Output this shift: Total I/O In: 120 [P.O.:120] Out: -   Alert, nontoxic Soft, mild expected TTP, incisions ok, air in bag. Ostomy ok  Lab Results:  Recent Labs    08/23/22 1329 08/24/22 0439  WBC 13.7* 12.7*  HGB 11.2* 10.8*  HCT 37.1* 35.2*  PLT 212 237   BMET Recent Labs    08/23/22 1329 08/24/22 0439  NA  --  137  K  --  3.9  CL  --  106  CO2  --  22  GLUCOSE  --  124*  BUN  --  8  CREATININE 1.18 0.85  CALCIUM  --  8.3*   PT/INR No results for input(s): "LABPROT", "INR" in the last 72 hours. ABG No results for input(s): "PHART", "HCO3" in the last 72 hours.  Invalid input(s): "PCO2", "PO2"  Studies/Results: No results found.  Anti-infectives: Anti-infectives (From admission, onward)    Start     Dose/Rate Route Frequency Ordered Stop   08/23/22 0700  cefoTEtan (CEFOTAN) 2 g in sodium chloride 0.9 % 100 mL IVPB        2 g 200 mL/hr over 30 Minutes Intravenous On call to O.R. 08/23/22 0645 08/23/22 0820       Assessment/Plan: s/p Procedure(s): XI ROBOTIC ASSISTED LOWER ANTERIOR RESECTION with Introperative assessment of profusion and TAP Block (N/A) FLEXIBLE SIGMOIDOSCOPY (N/A) DIVERTING LOOP ILEOSTOMY (N/A) Dr Dema Severin  - carb modified diet as tolerated -Cont MIVF -Ambulate 5x/day -PT/OT -WOCN;  ostomy teaching -Ppx: SQH, SCDs - Blood sugars ok - foley out this am, pt has voided  Ostomy output too high for dc. Started on imodium yesterday. Will see what ostomy output does today before increasing imodium and/or adding addl agents.   LOS: 2 days    Greer Pickerel 08/25/2022

## 2022-08-26 LAB — BASIC METABOLIC PANEL
Anion gap: 7 (ref 5–15)
BUN: 12 mg/dL (ref 6–20)
CO2: 26 mmol/L (ref 22–32)
Calcium: 8.7 mg/dL — ABNORMAL LOW (ref 8.9–10.3)
Chloride: 103 mmol/L (ref 98–111)
Creatinine, Ser: 0.97 mg/dL (ref 0.61–1.24)
GFR, Estimated: >60 mL/min (ref >60)
Glucose, Bld: 118 mg/dL — ABNORMAL HIGH (ref 70–99)
Potassium: 3.6 mmol/L (ref 3.5–5.1)
Sodium: 136 mmol/L (ref 135–145)

## 2022-08-26 LAB — CBC
HCT: 34.5 % — ABNORMAL LOW (ref 39.0–52.0)
Hemoglobin: 10.3 g/dL — ABNORMAL LOW (ref 13.0–17.0)
MCH: 23.9 pg — ABNORMAL LOW (ref 26.0–34.0)
MCHC: 29.9 g/dL — ABNORMAL LOW (ref 30.0–36.0)
MCV: 80 fL (ref 80.0–100.0)
Platelets: 211 10*3/uL (ref 150–400)
RBC: 4.31 MIL/uL (ref 4.22–5.81)
RDW: 14.6 % (ref 11.5–15.5)
WBC: 8 10*3/uL (ref 4.0–10.5)
nRBC: 0 % (ref 0.0–0.2)

## 2022-08-26 LAB — GLUCOSE, CAPILLARY
Glucose-Capillary: 116 mg/dL — ABNORMAL HIGH (ref 70–99)
Glucose-Capillary: 143 mg/dL — ABNORMAL HIGH (ref 70–99)

## 2022-08-26 MED ORDER — LOPERAMIDE HCL 2 MG PO CAPS
2.0000 mg | ORAL_CAPSULE | Freq: Two times a day (BID) | ORAL | 0 refills | Status: DC
  Filled 2022-08-26: qty 30, 15d supply, fill #0

## 2022-08-26 MED ORDER — LOPERAMIDE HCL 2 MG PO CAPS: 2.0000 mg | ORAL_CAPSULE | Freq: Two times a day (BID) | ORAL | 0 refills | Status: DC

## 2022-08-26 MED ORDER — TRAMADOL HCL 50 MG PO TABS: 50.0000 mg | ORAL_TABLET | Freq: Four times a day (QID) | ORAL | 0 refills | Status: AC | PRN

## 2022-08-26 NOTE — TOC Transition Note (Addendum)
Transition of Care [facility]) - CM/SW Discharge Note   Patient Details  Name: Robert [name] MRN: [id number] Date of Birth: 08-31-1970  [facility]) CM/SW Contact:  Henrietta Dine, RN Phone Number: 08/26/2022, 11:12 AM   Clinical Narrative:    D/C orders received; [facility] previously arranged w/ [place]; LVM for Erasmo Downer at agency to notify her of pt d/c; contact information for agency placed in follow up provider section d/c instructions; no TOC needs.  27- notified pt's wife would like to [facility] regarding [place]; spoke w/ pt's wife and gave her name of agency and they will contact her; she verbalized understanding; spoke w Leonard Downing at agency and she confirms referral has been received; agency will contact pt/wife.   Barriers to Discharge: No Barriers Identified   Patient Goals and CMS Choice CMS Medicare.gov Compare Post Acute Care list provided to:: Patient Choice offered to / list presented to : Patient  Discharge Placement                         Discharge Plan and Services Additional resources added to the After Visit Summary for   In-house Referral: Clinical Social Work              DME Arranged: N/A DME Agency: NA       HH Arranged: RN [place] Agency: Other - See comment ([place]) Date [place]: 08/24/22 Time [place]: 6314 Representative spoke with at [place]: [place] Determinants of Health ([place]) Interventions SDOH Screenings   Depression (PHQ2-9): Low Risk  (06/19/2022)  Tobacco Use: Medium Risk (08/24/2022)     Readmission Risk Interventions     No data to display

## 2022-08-26 NOTE — Anesthesia Postprocedure Evaluation (Signed)
Anesthesia Post Note  Patient: Win Guajardo  Procedure(s) Performed: XI ROBOTIC ASSISTED LOWER ANTERIOR RESECTION with Introperative assessment of profusion and TAP Block FLEXIBLE SIGMOIDOSCOPY DIVERTING LOOP ILEOSTOMY     Patient location during evaluation: PACU Anesthesia Type: General Level of consciousness: awake and alert Pain management: pain level controlled Vital Signs Assessment: post-procedure vital signs reviewed and stable Respiratory status: spontaneous breathing, nonlabored ventilation, respiratory function stable and patient connected to nasal cannula oxygen Cardiovascular status: blood pressure returned to baseline and stable Postop Assessment: no apparent nausea or vomiting Anesthetic complications: no   No notable events documented.  Last Vitals:  Vitals:   08/25/22 2038 08/26/22 0552  BP: (!) 149/84 (!) 180/95  Pulse: 64 (!) 56  Resp: 16 16  Temp: 36.9 C 36.8 C  SpO2: 93% 96%    Last Pain:  Vitals:   08/26/22 0812  TempSrc:   PainSc: [place]

## 2022-08-26 NOTE — Progress Notes (Signed)
3 Days Post-Op   Subjective/Chief Complaint: Tolerating carb modified Has emptied his pouch but still hasn't changed bag Slept well Ostomy 875    Objective: Vital signs in last 24 hours: Temp:  [98.2 F (36.8 C)-98.9 F (37.2 C)] 98.2 F (36.8 C) (01/14 0552) Pulse Rate:  [56-68] 56 (01/14 0552) Resp:  [16] 16 (01/14 0552) BP: (149-180)/(84-95) 180/95 (01/14 0552) SpO2:  [93 %-96 %] 96 % (01/14 0552) Last BM Date : 08/26/22  Intake/Output from previous day: 01/13 0701 - 01/14 0700 In: 1999.2 [P.O.:360; I.V.:1639.2] Out: 875 [Stool:875] Intake/Output this shift: Total I/O In: 240 [P.O.:240] Out: 0   Alert, nontoxic Soft, mild expected TTP, incisions ok, air in bag. Ostomy ok  Lab Results:  Recent Labs    08/25/22 0840 08/26/22 0518  WBC 9.3 8.0  HGB 11.0* 10.3*  HCT 36.1* 34.5*  PLT 216 211    BMET Recent Labs    08/25/22 0840 08/26/22 0518  NA 136 136  K 3.7 3.6  CL 103 103  CO2 27 26  GLUCOSE 179* 118*  BUN 11 12  CREATININE 1.08 0.97  CALCIUM 8.6* 8.7*    PT/INR No results for input(s): "LABPROT", "INR" in the last 72 hours. ABG No results for input(s): "PHART", "HCO3" in the last 72 hours.  Invalid input(s): "PCO2", "PO2"  Studies/Results: No results found.  Anti-infectives: Anti-infectives (From admission, onward)    Start     Dose/Rate Route Frequency Ordered Stop   08/23/22 0700  cefoTEtan (CEFOTAN) 2 g in sodium chloride 0.9 % 100 mL IVPB        2 g 200 mL/hr over 30 Minutes Intravenous On call to O.R. 08/23/22 0645 08/23/22 0820       Assessment/Plan: s/p Procedure(s): XI ROBOTIC ASSISTED LOWER ANTERIOR RESECTION with Introperative assessment of profusion and TAP Block (N/A) FLEXIBLE SIGMOIDOSCOPY (N/A) DIVERTING LOOP ILEOSTOMY (N/A) Dr Dema Severin  - carb modified diet as tolerated -Cont MIVF -Ambulate 5x/day -PT/OT -WOCN; ostomy teaching -Ppx: SQH, SCDs - Blood sugars ok - foley out this am, pt has voided  Ostomy  output safe for discharge. Needs to apply pouch before dc. If that happens today, will dc. Will dc on imodium. Advised pt to keep up with ostomy output and fluid status, may have to adjust imodium depending on ostomy output    LOS: 3 days    Greer Pickerel 08/26/2022

## 2022-08-27 ENCOUNTER — Ambulatory Visit: Payer: BLUE CROSS/BLUE SHIELD | Admitting: Family Medicine

## 2022-08-27 ENCOUNTER — Other Ambulatory Visit: Payer: Self-pay

## 2022-08-28 ENCOUNTER — Other Ambulatory Visit: Payer: Self-pay

## 2022-08-29 NOTE — Discharge Summary (Signed)
Patient ID: Robert [name] MRN: [id number] DOB/AGE: 09/15/1970 52 y.o.  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   Rectal cancer ([place]) 05/23/2022   Plantar wart of left foot 12/05/2020   Neck pain 06/09/2020   Bilateral hand pain 06/09/2020   Esophageal reflux 12/08/2015   Back pain 12/08/2015   Arthralgia 01/11/2013   Hyperlipidemia LDL goal <70 11/09/2012   Diabetes mellitus type 2 in obese ([place]) 11/09/2012   HTN (hypertension) 04/25/2012    Consultants Wound ostomy team  Procedures OR 08/23/22: Robotic assisted low anterior resection with diverting loop ileostomy Diagnostic flexible sigmoidoscopy - necessary to confirm tumor location Intraoperative assessment of perfusion using ICG fluorescence imaging Bilateral transversus abdominus plane (TAP) blocks   Hospital Course: He was admitted postoperatively and recovered uneventfully. His diet gradually advanced and tolerated this well. Ileostomy began functioning and he underwent teaching sessions with our wound ostomy nurse team. Imodium was initiated and ostomy output thickened and was noted to be at a safe level for discharge home. On 08/26/22 he was noted to be tolerating a diet, mobilizing well on his own; he was noted to be stable for discharge home by my partner. Follow-up in my office arranged for 09/10/22 @ 2pm. This was confirmed with him over phone call 08/28/22.  Final pathology:  A. COLON, RECTOSIGMOID, RESECTION:  - Invasive moderately differentiated adenocarcinoma, 2.5 cm, involving  rectum  - Carcinoma invades focally into the perirectal adipose tissue  - Resection margins are negative for carcinoma  - Negative for  lymphovascular invasion  - Fourteen lymph nodes, negative for carcinoma (0/14)  - See oncology table   B. DISTAL MARGIN DONUT:  - Rectal donut, negative for carcinoma    COLON AND RECTUM, CARCINOMA:  Resection, Including Transanal Disk Excision of Rectal Neoplasms  Procedure: Resection, rectosigmoid colon Tumor Site: Rectum Tumor Size: 2.5 cm Macroscopic Tumor Perforation: Not identified Macroscopic Evaluation of Mesorectum (required for rectal cancer): Complete Histologic Type: Adenocarcinoma Histologic Grade: G2: Moderately differentiated Multiple Primary Sites: Not applicable Tumor Extension: Tumor invades focally into perirectal adipose tissue Lymphovascular Invasion: Not identified Perineural Invasion: Not identified Treatment Effect: No known presurgical therapy Margins:      Margin Status for Invasive Carcinoma: All margins negative for invasive carcinoma      Distance from Invasive Carcinoma to Radial (Circumferential) Margin (required for rectal           tumors): 3.5 cm      Margin Status for Non-Invasive Tumor: All margins negative for high-grade dysplasia / intramucosal           carcinoma and low-grade dysplasia Regional Lymph Nodes:      Number of Lymph Nodes with Tumor: 0      Number of Lymph Nodes Examined: 14 Tumor Deposits: Not identified Distant Metastasis:      Distant Site(s) Involved: Not applicable Pathologic Stage Classification (pTNM, AJCC 8th Edition): pT3, pN0 Ancillary Studies: MMR / MSI testing will be ordered. Representative Tumor Block: A6 Comments: None   Dr. Burr Medico updated on pathology and plans to follow-up with him. His case has been submitted for discussion at multidisciplinary tumor board as well.   Allergies as of 08/26/2022       Reactions   Mucinex [guaifenesin  Er] Shortness Of Breath, Itching, Swelling, Dermatitis, Rash   Swelling of hands and feet   Nsaids Shortness Of Breath, Itching, Swelling, Rash   Aleve and Advil     Latex Rash, Other (See Comments)   Cartilage will harden         Medication List     TAKE these medications    amLODipine 5 MG tablet Commonly known as: NORVASC TAKE 1 TABLET (5 MG TOTAL) BY MOUTH DAILY.   carvedilol 25 MG tablet Commonly known as: COREG Take 1 tablet (25 mg total) by mouth 2 (two) times daily.   esomeprazole 20 MG capsule Commonly known as: NEXIUM Take 20 mg by mouth daily.   glucose blood test strip Commonly known as: Cool Blood Glucose Test Strips Check blood sugar once daily   Iron (Ferrous Sulfate) 325 (65 Fe) MG Tabs Take 325 mg by mouth 2 (two) times daily with a meal.   loperamide 2 MG capsule Commonly known as: IMODIUM Take 1 capsule (2 mg total) by mouth 2 (two) times daily.   losartan 100 MG tablet Commonly known as: COZAAR TAKE 1 TABLET BY MOUTH EVERY DAY   metFORMIN 500 MG tablet Commonly known as: GLUCOPHAGE Take 1 tablet (500 mg total) by mouth 2 (two) times daily. What changed: additional instructions   multivitamin with minerals tablet Take 1 tablet by mouth 4 (four) times a week.   tiZANidine 2 MG tablet Commonly known as: ZANAFLEX Take 0.5-2 tablets (1-4 mg total) by mouth 2 (two) times daily as needed for muscle spasms. What changed:  how much to take when to take this   traMADol 50 MG tablet Commonly known as: Ultram Take 1 tablet by mouth every 6 hours as needed for up to 5 days (postop pain not controlled with tylenol and ibuprofen first).          Follow-up Information     Ileana Roup, MD. Go on 09/10/2022.   Specialties: General Surgery, Colon and Rectal Surgery Contact information: [place] [id number] [facility].   Why: [facility] will provide nursing in the home after discharge to assist with the ostomy.        [facility] Follow [facility].   Contact information: [place] [address] [phone]                 Lawerance Matsuo M. Dema Severin, M.D. [facility]

## 2022-08-30 ENCOUNTER — Other Ambulatory Visit (HOSPITAL_COMMUNITY): Payer: Self-pay

## 2022-09-05 ENCOUNTER — Other Ambulatory Visit: Payer: Self-pay

## 2022-09-05 LAB — SURGICAL PATHOLOGY

## 2022-09-05 IMAGING — DX DG HIP (WITH OR WITHOUT PELVIS) 2-3V*L*
3 series · 3 of 3 positions shown · non-contrast
Comparison: None.

CLINICAL DATA: Left hip pain.  No known injury.

EXAM:
DG HIP (WITH OR WITHOUT PELVIS) 2-3V LEFT

[pelvis ap]
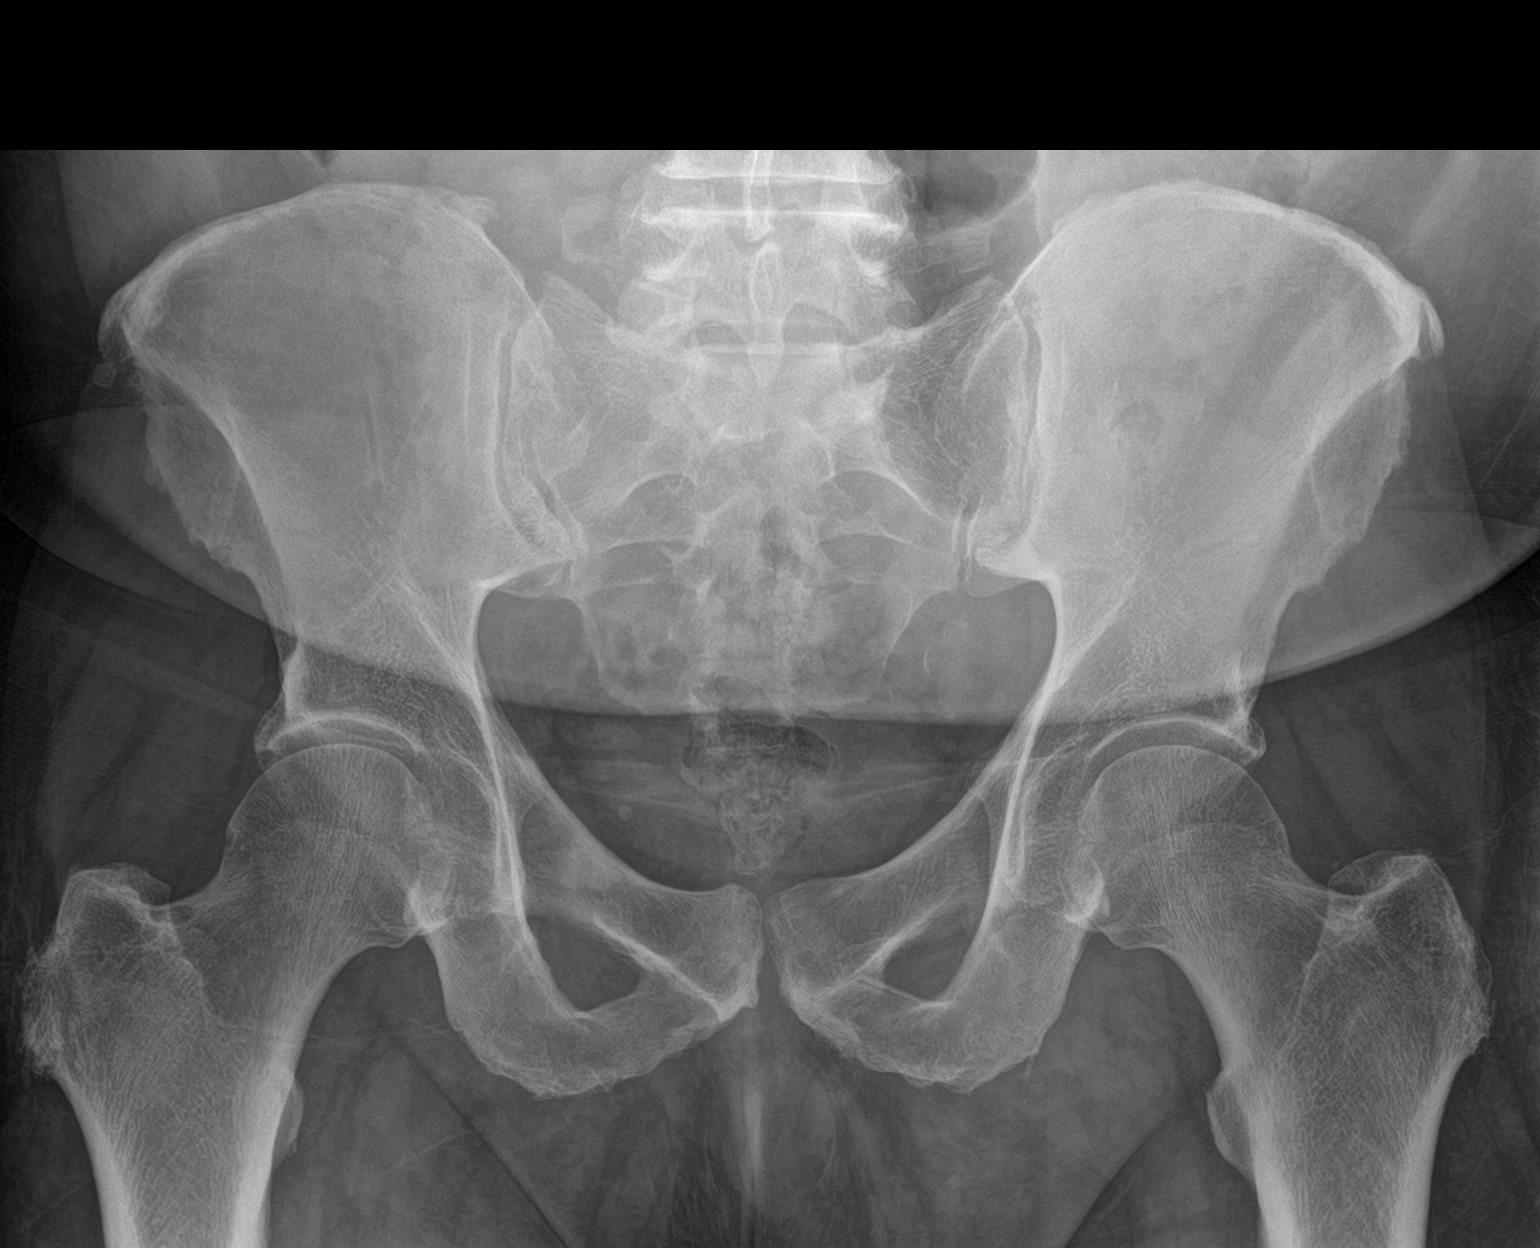

[hip ap]
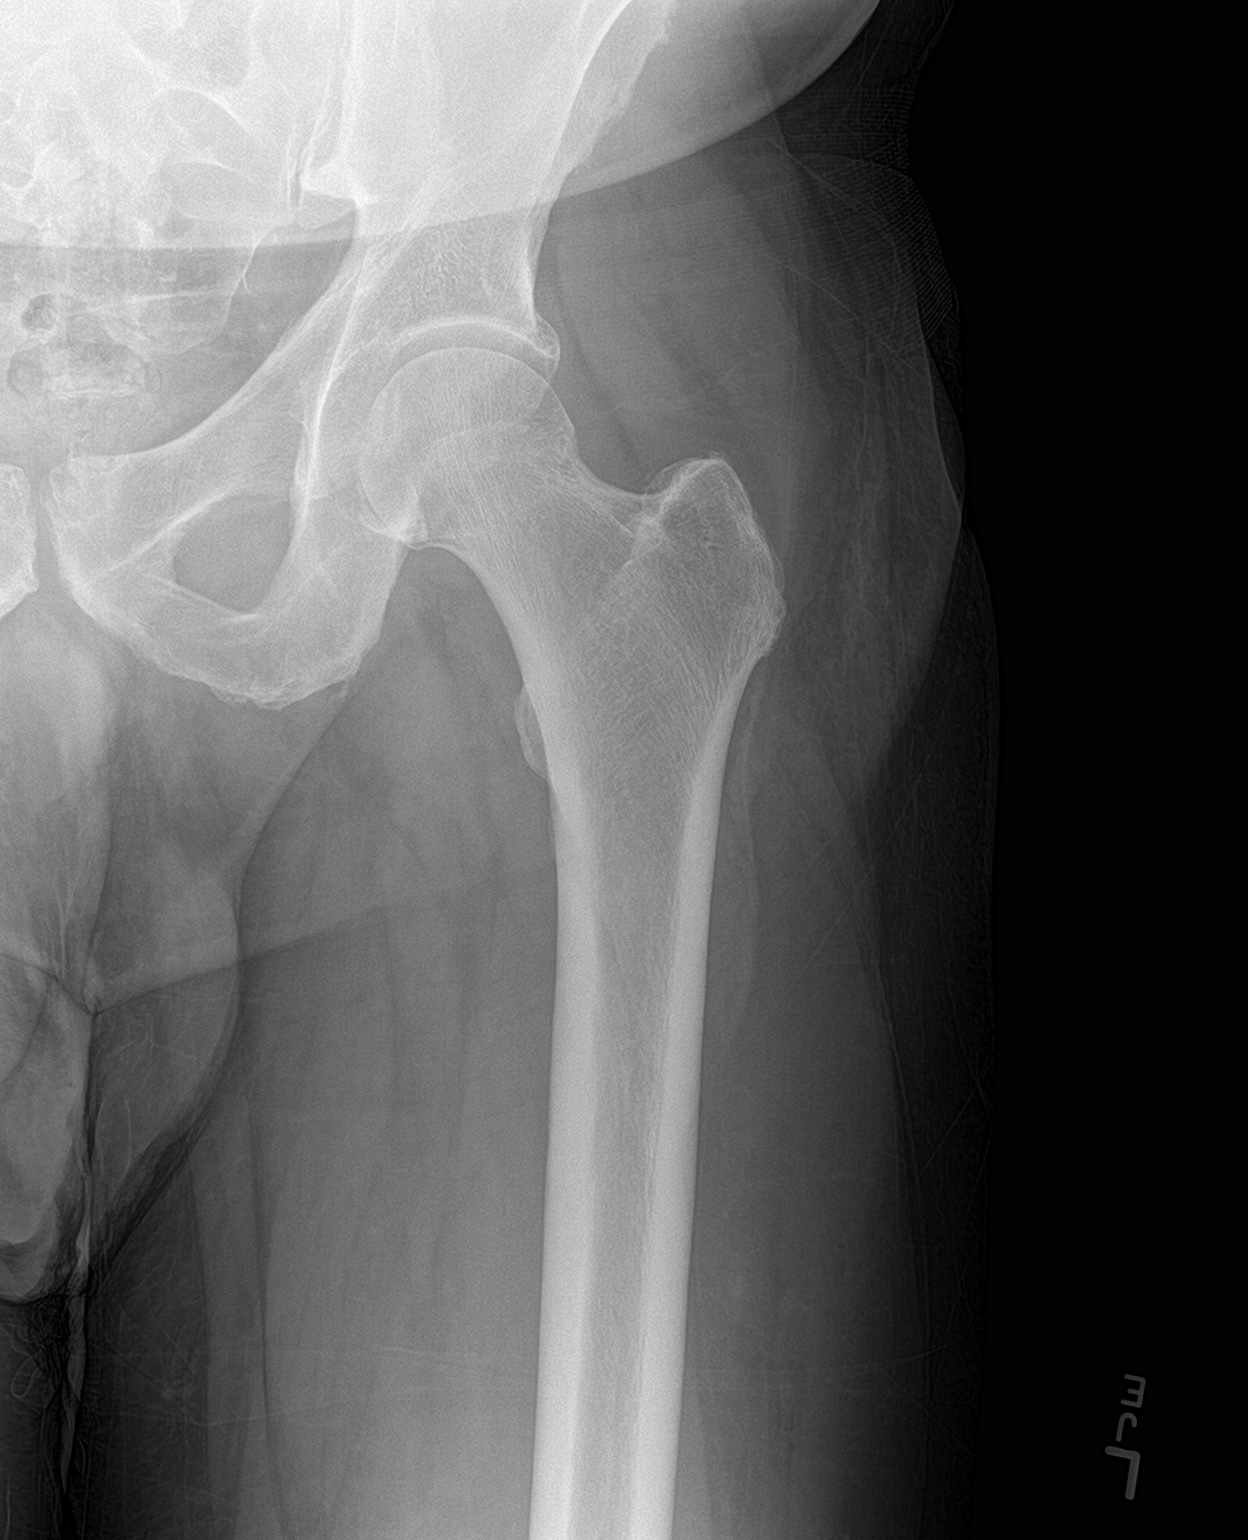

[hip lat]
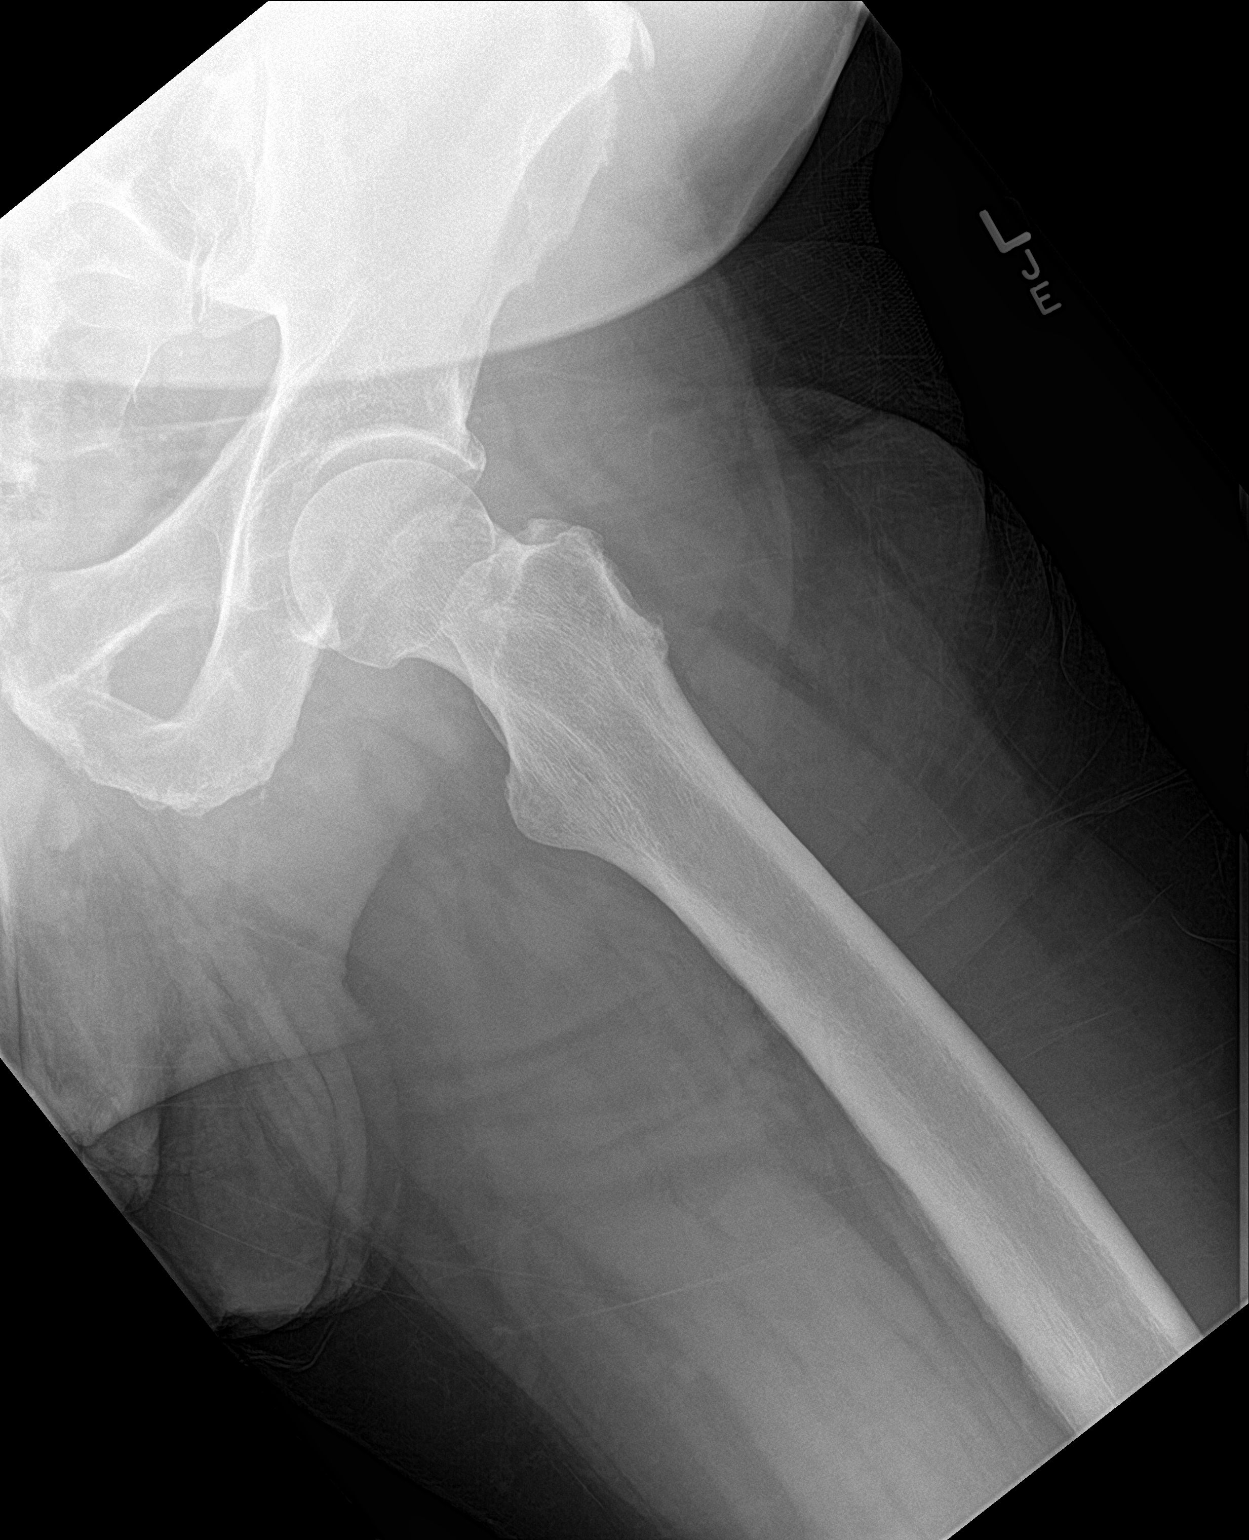

[3 of 3 positions shown; findings below may reference images not displayed]

FINDINGS: Normal appearing left hip. Mild lower lumbar spine degenerative
changes.
IMPRESSION: Normal left hip.

## 2022-09-05 NOTE — Progress Notes (Signed)
The proposed treatment discussed in conference is for discussion purpose only and is not a binding recommendation.  The patients have not been physically examined, or presented with their treatment options.  Therefore, final treatment plans cannot be decided.  

## 2022-09-10 NOTE — Progress Notes (Addendum)
Patient Care Team: [name], MD as PCP - General (Family Medicine) Werner Lean, MD as PCP - Cardiology (Cardiology) Truitt Merle, MD as Consulting Physician (Oncology) Alla Feeling, NP as Nurse Practitioner (Oncology) Royston Bake, RN as Oncology Nurse Navigator (Oncology)   CHIEF COMPLAINT: Follow-up rectal cancer  Oncology History  Rectal cancer [facility])  05/09/2022 Procedure   Colonoscopy by Dr. Fuller Plan, impression - Five 5 to 7 mm polyps in the rectum, in the transverse colon and at the hepatic flexure, removed with a cold snare. Resected and retrieved. removed with a cold snare. Resected and retrieved. 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 Submucosal injection tattoo. - Internal hemorrhoids. - The examination was otherwise normal on direct and retroflexion views.   05/09/2022 Initial Biopsy   Diagnosis 1. Surgical [P], colon, transverse x1 and hepatic flexure x 3 (2 pieces only, 2 stuck together taken off together), polyp (4) - TUBULAR ADENOMA(S) - NEGATIVE FOR HIGH-GRADE DYSPLASIA OR MALIGNANCY 2. Surgical [P], colon, rectal mass - INVASIVE MODERATELY DIFFERENTIATED ADENOCARCINOMA. SEE NOTE 3. Surgical [P], colon, rectum, polyp (1) - HYPERPLASTIC POLYP   05/09/2022 Tumor Marker   CEA < 2.0   05/17/2022 Imaging   IMPRESSION: 1. Known primary malignancy is not well visualized. Correlate with recent colonoscopy. 2. No evidence of metastatic disease in the chest, abdomen or pelvis. 3. Mildly enlarged bilateral inguinal lymph nodes, likely reactive. 4. 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 Recommend annual imaging followup by CTA or MRA. This recommendation follows 2010 ACCF/AHA/AATS/ACR/ASA/SCA/SCAI/SIR/STS/SVM Guidelines for the Diagnosis and Management of Patients with Thoracic Aortic Disease. Circulation. 2010; 121: Y659-D357. 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 Severe coronary artery calcifications of the LAD and  circumflex. 6. Aortic Atherosclerosis (ICD10-I70.0).   05/23/2022 Initial Diagnosis   Rectal cancer ([place])   06/06/2022 -  Chemotherapy   Patient is on Treatment Plan : RECTAL Xelox (Capeox) (130/850) q21d x 6 cycles     08/23/2022 Cancer Staging   Staging form: Colon and Rectum, AJCC 8th Edition - Pathologic stage from 08/23/2022: Stage IIA (pT3, pN0, cM0) - Signed by Alla Feeling, NP on 09/10/2022 Stage prefix: Initial diagnosis Total positive nodes: 0   08/23/2022 Surgery   PROCEDURE:  Robotic assisted low anterior resection with diverting loop ileostomy SURGEON: Sharon Mt. Dema Severin, MD ASSISTANT: Leighton Ruff, MD   0/17/7939 Pathology Results   FINAL MICROSCOPIC DIAGNOSIS:  A. COLON, RECTOSIGMOID, RESECTION:  - Invasive moderately differentiated adenocarcinoma, 2.5 cm, involving rectum  - Carcinoma invades focally into the perirectal adipose tissue  - Resection margins are negative for carcinoma  - Negative for lymphovascular invasion  - Fourteen lymph nodes, negative for carcinoma (0/14)  - See oncology table  B. DISTAL MARGIN DONUT:  - Rectal donut, negative for carcinoma   Regional Lymph Nodes:       Number of Lymph Nodes with Tumor: 0       Number of Lymph Nodes Examined: 14  Pathologic Stage Classification (pTNM, AJCC 8th Edition): pT3, pN0  Mismatch Repair Protein (IHC)  SUMMARY INTERPRETATION: NORMAL  MSI-Stable      CURRENT THERAPY: S/P upfront surgical resection, pending adjuvant therapy  INTERVAL HISTORY Robert [name] returns for follow-up as scheduled, last seen by [place] as a new patient 05/23/2022.  Further workup pelvic MRI showed T1/T2, N0 was followed up with a lower endoscopic ultrasound showing uT2, uN0 lesion.  He proceeded with upfront robotic LAR with diverting loop ileostomy by Dr. Dema Severin on 08/23/2022.  Surgical path of the rectosigmoid resection showed invasive grade 2 adenocarcinoma spanning 2.5 cm involving the rectum with carcinoma invading focally  into the perirectal adipose tissue.  0/14 lymph nodes involved with clear margins and no LVI.  It was staged PT3 pN0 with preserved expression of the major mismatch repair proteins and was microsatellite stable.  Recovery was uneventful and he was discharged on postop day 3 on 1/14.  He presents today with his wife, feeling well overall.  He has periodic bouts of fatigue and occasional lightheadedness with prolonged standing.  Has not gone back to work. Appetite fluctuates, eating less carbs, sugar, alcohol, and caffeine therefore his weight has dropped. Does not tolerate oral iron due to significant GI upset. Energy about 75% of baseline.  No abdominal pain unless sneezing or coughing, no longer needing meds.  Stool output is thick/paste in the early morning, then thins out throughout the day, empties bag at bedtime.  He saw Dr. Dema Severin and the ostomy clinic earlier this week who felt he is doing well.  ROS  All other systems reviewed and negative  Past Medical History:  Diagnosis Date   Arthralgia 01/11/2013   Diffuse, b/l Hips R>L Knees, ankles, low back   Arthritis    Back pain 12/08/2015   Cancer ([place])    Coronary artery disease    Diabetes mellitus type 2 in obese ([place]) 11/09/2012   Dyslipidemia 11/09/2012   Esophageal reflux 12/08/2015   Feeling grief 06/09/2020   History of migraine headaches    Hyperglycemia 11/09/2012   Hypertension      Past Surgical History:  Procedure Laterality Date   DIVERTING ILEOSTOMY N/A 08/23/2022   Procedure: DIVERTING LOOP ILEOSTOMY;  Surgeon: Ileana Roup, MD;  Location: [facility];  Service: General;  Laterality: N/A;   EUS N/A 06/21/2022   Procedure: LOWER ENDOSCOPIC ULTRASOUND (EUS);  Surgeon: Irving Copas., MD;  Location: [facility];  Service: Gastroenterology;  Laterality: N/A;   FLEXIBLE SIGMOIDOSCOPY N/A 06/21/2022   Procedure: FLEXIBLE SIGMOIDOSCOPY;  Surgeon: Rush Landmark Telford Nab., MD;  Location: [facility];  Service:  Gastroenterology;  Laterality: N/A;   FLEXIBLE SIGMOIDOSCOPY N/A 08/23/2022   Procedure: FLEXIBLE SIGMOIDOSCOPY;  Surgeon: Ileana Roup, MD;  Location: [facility];  Service: General;  Laterality: N/A;   XI ROBOTIC ASSISTED LOWER ANTERIOR RESECTION N/A 08/23/2022   Procedure: XI ROBOTIC ASSISTED LOWER ANTERIOR RESECTION with Introperative assessment of profusion and TAP Block;  Surgeon: Ileana Roup, MD;  Location: [facility];  Service: General;  Laterality: N/A;     Outpatient Encounter Medications as of 09/13/2022  Medication Sig   amLODipine (NORVASC) 5 MG tablet TAKE 1 TABLET (5 MG TOTAL) BY MOUTH DAILY.   carvedilol (COREG) 25 MG tablet Take 1 tablet (25 mg total) by mouth 2 (two) times daily.   esomeprazole (NEXIUM) 20 MG capsule Take 20 mg by mouth daily.   glucose blood (COOL BLOOD GLUCOSE TEST STRIPS) test strip Check blood sugar once daily   loperamide (IMODIUM) 2 MG capsule Take 1 capsule (2 mg total) by mouth 2 (two) times daily.   losartan (COZAAR) 100 MG tablet TAKE 1 TABLET BY MOUTH EVERY DAY   metFORMIN (GLUCOPHAGE) 500 MG tablet Take 1 tablet (500 mg total) by mouth 2 (two) times daily. (Patient taking differently: Take 1,000 mg by mouth 2 (two) times daily. '1000mg'$  in am, '1000mg'$  pm)   tiZANidine (ZANAFLEX) 2 MG tablet Take 0.5-2 tablets (1-4 mg total) by mouth 2 (two) times daily as needed  for muscle spasms. (Patient taking differently: Take 2-4 mg by mouth 3 (three) times daily as needed for muscle spasms.)   Iron, Ferrous Sulfate, 325 (65 Fe) MG TABS Take 325 mg by mouth 2 (two) times daily with a meal. (Patient not taking: Reported on 08/08/2022)   Multiple Vitamins-Minerals (MULTIVITAMIN WITH MINERALS) tablet Take 1 tablet by mouth 4 (four) times a week.   No facility-administered encounter medications on file as of 09/13/2022.     Today's Vitals   09/13/22 0953 09/13/22 1003  BP: 119/79   Pulse: 73   Resp: 14   Temp: 98.3 F (36.8 C)   TempSrc: Oral   SpO2: 100%    Weight: 265 lb 4.8 oz (120.3 kg)   PainSc:  0-No pain   Body mass index is 33.16 kg/m.   PHYSICAL EXAM GENERAL:alert, no distress and comfortable SKIN: no rash  EYES: sclera clear NECK: without mass LYMPH:  no palpable cervical or supraclavicular lymphadenopathy  LUNGS: clear with normal breathing effort HEART: regular rate & rhythm, no lower extremity edema ABDOMEN: abdomen soft, non-tender and normal bowel sounds. Surgical incisions healing well. RLQ ileostomy in place NEURO: alert & oriented x 3 with fluent speech   CBC    Component Value Date/Time   WBC 8.0 08/26/2022 0518   RBC 4.31 08/26/2022 0518   HGB 10.3 (L) 08/26/2022 0518   HCT 34.5 (L) 08/26/2022 0518   PLT 211 08/26/2022 0518   MCV 80.0 08/26/2022 0518   MCH 23.9 (L) 08/26/2022 0518   MCHC 29.9 (L) 08/26/2022 0518   RDW 14.6 08/26/2022 0518   LYMPHSABS 1.9 07/20/2022 0930   MONOABS 0.5 07/20/2022 0930   EOSABS 0.4 07/20/2022 0930   BASOSABS 0.1 07/20/2022 0930     CMP     Component Value Date/Time   NA 136 08/26/2022 0518   K 3.6 08/26/2022 0518   CL 103 08/26/2022 0518   CO2 26 08/26/2022 0518   GLUCOSE 118 (H) 08/26/2022 0518   BUN 12 08/26/2022 0518   CREATININE 0.97 08/26/2022 0518   CREATININE 1.19 06/09/2020 1613   CALCIUM 8.7 (L) 08/26/2022 0518   PROT 7.0 08/10/2022 0954   ALBUMIN 4.0 08/10/2022 0954   AST 23 08/10/2022 0954   ALT 22 08/10/2022 0954   ALKPHOS 63 08/10/2022 0954   BILITOT 0.5 08/10/2022 0954   GFRNONAA >60 08/26/2022 0518     ASSESSMENT & PLAN:52 year old male   Moderately differentiated adenocarcinoma of the rectum -We reviewed his medical record in detail with the patient and his wife.  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 CT negative for distant metastasis -local staging MRI  showed T1/T2, N0 rectal cancer and likely reactive inguinal nodes.  -rectal EUS showed uT2, uN0 disease, case reviewed in multidisciplinary conference and the recommendation was for upfront surgery -Proceeded with LAR and diverting ileostomy with Dr. Dema Severin on 08/23/2022. I reviewed surgical path which showed invasive grade 2 adenocarcinoma spanning 2.5 cm involving the rectum with carcinoma invading focally into the perirectal adipose tissue.  0/14 lymph nodes involved with clear margins and no LVI. Stage pT3 pN0, IIA, with no high risk features, MMR normal and MSI stable  -Case again discussed in conference and the recommendation is for 3 months adjuvant chemo. Dr. Lisbeth Renshaw felt that he could forego radiation -We discussed adjuvant FOLFOX q14 days x6 cycles vs  CapeOx q21 days x4 cycles. Potential risk/benefit and SEs for both regimens were discussed. He would like to proceed with CapeOx and have port placed for convenience. Will discuss with Dr. Dema Severin -Chemotherapy consent: Side effects including but does not not limited to, fatigue, nausea, vomiting, diarrhea, hair thinning/loss, cold sensitivity and neuropathy, fluid retention, renal and liver dysfunction, neutropenic fever, needed for blood transfusion, bleeding, were discussed with patient in great detail. He agrees to proceed.  The goal of adjuvant chemo is curative.  -We also discussed the risk of cancer recurrence in the future and a surveillance plan including a physical exam and lab test (including CBC, CMP and CEA) every 3 months for the first 2 years, then every 6-12 months, colonoscopy in one year, and surveilliance CT scan every 6-12 month for up to 3 year.  Total of 5 years surveillance  -Mr. Lina has recovered well from surgery. Will check baseline labs today. PS is adequate for treatment, plan to start in 2 weeks. He will get port placed and attend chemo class first -F/up with cycle 1 -Pt seen with Dr. Burr Medico   2. IDA -Secondary to  #1 -Started oral iron in 03/2022. Tried taking with vit C and separating from reflux meds. Eventually he can not tolerate oral iron and stopped -We discussed IV iron including risk of allergic reaction, he agrees to proceed. Plan to start with C1 chemo   3. incidental findings on rectal staging: Enlarged thoracic aorta and coronary artery calcifications -Met with cardiothoracic surgery, this is being monitored. No plan for surgery at this time   4. Chronic conditions: HTN, HL, DM, GERD, former tobacco and current alcohol use -On amlodipine, carvedilol, losartan, metformin, and esomeprazole -Last hemoglobin A1c 7.2 in 10/2021 -He quit smoking more than 20 years ago and has decreased his alcohol intake.   -Since losing weight from surgery, BGs have improved now ~120's. He is hoping to cut metformine dose -PCP Dr. Willette Alma, next visit next week     PLAN: -Reviewed surgical path, stage IIA rectal cancer  -Recommend adjuvant chemo: plan CapeOx with oxaliplatin on day 1 (q21 days) and Oral xeloda twice daily on days 1-14 (q21 days), starting in 2 weeks -Chemo class and port placement (will message Dr. Dema Severin) -F/up on day 1, then toxicity check one week later -IV iron replacement pending insurance preference    Orders Placed This Encounter  Procedures   CBC with Differential ([place] Only)    Standing Status:   Future    Number of Occurrences:   1    Standing Expiration Date:   09/14/2023   CMP ([place] only)    Standing Status:   Future    Number of Occurrences:   1    Standing Expiration Date:   09/14/2023   CEA (Access)-[facility] ONLY    Standing Status:   Future    Number of Occurrences:   1    Standing Expiration Date:   09/14/2023   Ferritin    Standing Status:   Future    Number of Occurrences:   1    Standing Expiration Date:   09/14/2023   Iron and Iron Binding Capacity ([facility] only)    Standing Status:   Future    Number of Occurrences:   1    Standing Expiration  Date:   09/14/2023      All questions were answered. The patient knows to call the clinic with any problems, questions or concerns. No barriers to  learning were detected.  Cira Rue, NP-C 09/13/2022  Addendum I have seen the patient, examined him. I agree with the assessment and and plan and have edited the notes.   I reviewed his surgical pathology in detail and discussed with patient and his wife.  His case has been reviewed in our GI tumor board, his initial clinical staging was T2 N0, and pathological staging was T3 N0, with clear margins and no high risk features.  We discussed the option of chemotherapy with or without radiation.  Radiation oncologist Dr. Lisbeth Renshaw feel it is reasonable to forego adjuvant radiation given overall favorable prognosis.  I recommend adjuvant chemotherapy CAPOX for 3 months, chemo consent obtained.  Plan to start in about 2 weeks.  Patient agrees with port placement.  Truitt Merle MD 09/13/2022

## 2022-09-11 ENCOUNTER — Ambulatory Visit (HOSPITAL_COMMUNITY)
Admission: RE | Admit: 2022-09-11 | Discharge: 2022-09-11 | Disposition: A | Discharge status: Home / Self Care | Payer: BLUE CROSS/BLUE SHIELD | Source: Ambulatory Visit | Attending: Surgery | Admitting: Surgery

## 2022-09-11 DIAGNOSIS — Z432 Encounter for attention to ileostomy: Secondary | ICD-10-CM | POA: Insufficient documentation

## 2022-09-11 DIAGNOSIS — C20 Malignant neoplasm of rectum: Secondary | ICD-10-CM | POA: Insufficient documentation

## 2022-09-11 NOTE — Progress Notes (Signed)
[facility]   Reason for visit:  RLQ ileostomy HPI:  Rectal cancer Past Medical History:  Diagnosis Date   Arthralgia 01/11/2013   Diffuse, b/l Hips R>L Knees, ankles, low back   Arthritis    Back pain 12/08/2015   Cancer ([place])    Coronary artery disease    Diabetes mellitus type 2 in obese ([place]) 11/09/2012   Dyslipidemia 11/09/2012   Esophageal reflux 12/08/2015   Feeling grief 06/09/2020   History of migraine headaches    Hyperglycemia 11/09/2012   Hypertension    Family History  Problem Relation Age of Onset   Arthritis Mother    Hypertension Mother    Diabetes Mother    Cancer Mother    Arthritis Father    Hypertension Father    Diabetes Father    Sleep apnea Father    Obesity Father    Arthritis Maternal Grandmother    Diabetes Maternal Grandmother    Stroke Maternal Grandmother    Arthritis Paternal Grandmother    Diabetes Paternal Grandmother    Cancer Paternal Grandmother        lung, smoker   Diabetes Paternal Grandfather    Heart disease Paternal Grandfather 37       MI   Hyperlipidemia Brother    Hypertension Brother    Diabetes Brother    COPD Maternal Grandfather    Kidney disease Neg Hx    Allergies  Allergen Reactions   Mucinex [Guaifenesin Er] Shortness Of Breath, Itching, Swelling, Dermatitis and Rash    Swelling of hands and feet   Nsaids Shortness Of Breath, Itching, Swelling and Rash    Aleve and Advil    Latex Rash and Other (See Comments)    Cartilage will harden    Current Outpatient Medications  Medication Sig Dispense Refill Last Dose   amLODipine (NORVASC) 5 MG tablet TAKE 1 TABLET (5 MG TOTAL) BY MOUTH DAILY. 90 tablet 1    capecitabine (XELODA) 500 MG tablet Take 4 tablets (2,000 mg total) by mouth 2 (two) times daily after a meal. Take for 14 days on, 7 days off. Repeat every 21 days. 112 tablet 0    carvedilol (COREG) 25 MG tablet Take 1 tablet (25 mg total) by mouth 2 (two) times daily. 180 tablet 3     esomeprazole (NEXIUM) 20 MG capsule Take 20 mg by mouth daily.      glucose blood (COOL BLOOD GLUCOSE TEST STRIPS) test strip Check blood sugar once daily 100 each 12    Iron, Ferrous Sulfate, 325 (65 Fe) MG TABS Take 325 mg by mouth 2 (two) times daily with a meal. (Patient not taking: Reported on 08/08/2022) 30 tablet 2    lidocaine-prilocaine (EMLA) cream Apply to affected area once 30 g 3    loperamide (IMODIUM) 2 MG capsule Take 1 capsule (2 mg total) by mouth 2 (two) times daily. 30 capsule 0    losartan (COZAAR) 100 MG tablet TAKE 1 TABLET BY MOUTH EVERY DAY 90 tablet 1    metFORMIN (GLUCOPHAGE) 500 MG tablet Take 1 tablet (500 mg total) by mouth 2 (two) times daily. (Patient taking differently: Take 1,000 mg by mouth 2 (two) times daily. '1000mg'$  in am, '1000mg'$  pm) 180 tablet 1    Multiple Vitamins-Minerals (MULTIVITAMIN WITH MINERALS) tablet Take 1 tablet by mouth 4 (four) times a week.      ondansetron (ZOFRAN) 8 MG tablet Take 1 tablet (8 mg total) by mouth every 8 (eight) hours as  needed for nausea or vomiting. Start on the third day after chemotherapy. 30 tablet 1    prochlorperazine (COMPAZINE) 10 MG tablet Take 1 tablet (10 mg total) by mouth every 6 (six) hours as needed for nausea or vomiting. 30 tablet 1    tiZANidine (ZANAFLEX) 2 MG tablet Take 0.5-2 tablets (1-4 mg total) by mouth 2 (two) times daily as needed for muscle spasms. (Patient taking differently: Take 2-4 mg by mouth 3 (three) times daily as needed for muscle spasms.) 30 tablet 5    No current facility-administered medications for this encounter.   ROS  Review of Systems  Gastrointestinal:        RLQ ileostomy  Skin:  Positive for color change.  Psychiatric/Behavioral: Negative.    All other systems reviewed and are negative.  Vital signs:  BP 109/76   Pulse 70   Temp 98.4 F (36.9 C)   Resp 18   SpO2 98%  Exam:  Physical Exam Vitals reviewed.  Constitutional:      Appearance: Normal appearance.   Abdominal:     Palpations: Abdomen is soft.     Comments: RLQ ileostomy  Skin:    General: Skin is warm and dry.     Findings: Erythema present.  Neurological:     Mental Status: He is alert and oriented to person, place, and time.  Psychiatric:        Mood and Affect: Mood normal.        Behavior: Behavior normal.     Stoma type/location:  RLQ ileostomy Stomal assessment/size:  1 " pink and moist   Peristomal assessment:  intact  no leaks since discharge home Treatment options for stomal/peristomal skin: 1 piece flexible convex with barrier ring , stoma powder and skin prep to peristomal skin Output: liquid brown stool Ostomy pouching: 1pc. Convex  wearing [id number] hollister with ring powder and skin prep Education provided:  Discussed risk of dehydration, he is drinking sufficiently he reports.  No dry mouth, dizziness, concentrated urine. 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  We discuss risk of hernia and to minimize/avoid heavy lifting, pushing and pulling.     Impression/dx  ileostomy Discussion  Supplies ordered from [facility]. Will be available for help with supplies as needed.  States he is ok for now.  Plan  See back as needed.     Visit time:  45inutes.   Domenic Moras FNP-BC

## 2022-09-13 ENCOUNTER — Inpatient Hospital Stay: Payer: BLUE CROSS/BLUE SHIELD

## 2022-09-13 ENCOUNTER — Encounter: Payer: Self-pay | Admitting: Nurse Practitioner

## 2022-09-13 ENCOUNTER — Inpatient Hospital Stay: Payer: BLUE CROSS/BLUE SHIELD | Attending: Nurse Practitioner | Admitting: Nurse Practitioner

## 2022-09-13 VITALS — BP 119/79 | HR 73 | Temp 98.3°F | Resp 14 | Wt 265.3 lb

## 2022-09-13 DIAGNOSIS — R197 Diarrhea, unspecified: Secondary | ICD-10-CM | POA: Diagnosis not present

## 2022-09-13 DIAGNOSIS — Z87891 Personal history of nicotine dependence: Secondary | ICD-10-CM | POA: Insufficient documentation

## 2022-09-13 DIAGNOSIS — C20 Malignant neoplasm of rectum: Secondary | ICD-10-CM | POA: Diagnosis present

## 2022-09-13 DIAGNOSIS — Z5111 Encounter for antineoplastic chemotherapy: Secondary | ICD-10-CM | POA: Diagnosis present

## 2022-09-13 DIAGNOSIS — D509 Iron deficiency anemia, unspecified: Secondary | ICD-10-CM | POA: Diagnosis not present

## 2022-09-13 DIAGNOSIS — K219 Gastro-esophageal reflux disease without esophagitis: Secondary | ICD-10-CM | POA: Insufficient documentation

## 2022-09-13 DIAGNOSIS — I2510 Atherosclerotic heart disease of native coronary artery without angina pectoris: Secondary | ICD-10-CM | POA: Insufficient documentation

## 2022-09-13 DIAGNOSIS — I10 Essential (primary) hypertension: Secondary | ICD-10-CM | POA: Diagnosis not present

## 2022-09-13 LAB — IRON AND IRON BINDING CAPACITY (CC-WL,HP ONLY)
Iron: 69 ug/dL (ref 45–182)
Saturation Ratios: 15 % — ABNORMAL LOW (ref 17.9–39.5)
TIBC: 459 ug/dL — ABNORMAL HIGH (ref 250–450)
UIBC: 390 ug/dL — ABNORMAL HIGH (ref 117–376)

## 2022-09-13 LAB — CMP (CANCER CENTER ONLY)
ALT: 22 U/L (ref 0–44)
AST: 20 U/L (ref 15–41)
Albumin: 4.3 g/dL (ref 3.5–5.0)
Alkaline Phosphatase: 72 U/L (ref 38–126)
Anion gap: 7 (ref 5–15)
BUN: 28 mg/dL — ABNORMAL HIGH (ref 6–20)
CO2: 24 mmol/L (ref 22–32)
Calcium: 10 mg/dL (ref 8.9–10.3)
Chloride: 106 mmol/L (ref 98–111)
Creatinine: 1.45 mg/dL — ABNORMAL HIGH (ref 0.61–1.24)
GFR, Estimated: 58 mL/min — ABNORMAL LOW (ref >60)
Glucose, Bld: 155 mg/dL — ABNORMAL HIGH (ref 70–99)
Potassium: 4.4 mmol/L (ref 3.5–5.1)
Sodium: 137 mmol/L (ref 135–145)
Total Bilirubin: 0.4 mg/dL (ref 0.3–1.2)
Total Protein: 7.7 g/dL (ref 6.5–8.1)

## 2022-09-13 LAB — CBC WITH DIFFERENTIAL (CANCER CENTER ONLY)
Abs Immature Granulocytes: 0.02 10*3/uL (ref 0.00–0.07)
Basophils Absolute: 0.1 10*3/uL (ref 0.0–0.1)
Basophils Relative: 1 %
Eosinophils Absolute: 0.3 10*3/uL (ref 0.0–0.5)
Eosinophils Relative: 4 %
HCT: 39.4 % (ref 39.0–52.0)
Hemoglobin: 12.5 g/dL — ABNORMAL LOW (ref 13.0–17.0)
Immature Granulocytes: 0 %
Lymphocytes Relative: 31 %
Lymphs Abs: 2 10*3/uL (ref 0.7–4.0)
MCH: 24.5 pg — ABNORMAL LOW (ref 26.0–34.0)
MCHC: 31.7 g/dL (ref 30.0–36.0)
MCV: 77.1 fL — ABNORMAL LOW (ref 80.0–100.0)
Monocytes Absolute: 0.5 10*3/uL (ref 0.1–1.0)
Monocytes Relative: 8 %
Neutro Abs: 3.7 10*3/uL (ref 1.7–7.7)
Neutrophils Relative %: 56 %
Platelet Count: 322 10*3/uL (ref 150–400)
RBC: 5.11 MIL/uL (ref 4.22–5.81)
RDW: 15.3 % (ref 11.5–15.5)
WBC Count: 6.6 10*3/uL (ref 4.0–10.5)
nRBC: 0 % (ref 0.0–0.2)

## 2022-09-13 LAB — CEA (ACCESS): CEA (CHCC): <1 ng/mL (ref 0.00–5.00)

## 2022-09-13 LAB — FERRITIN: Ferritin: 33 ng/mL (ref 24–336)

## 2022-09-13 MED ORDER — CAPECITABINE 500 MG PO TABS
850.0000 mg/m2 | ORAL_TABLET | Freq: Two times a day (BID) | ORAL | 0 refills | Status: DC
  Filled 2022-09-13: qty 112, 14d supply, fill #0

## 2022-09-13 MED ORDER — ONDANSETRON HCL 8 MG PO TABS: 8.0000 mg | ORAL_TABLET | Freq: Three times a day (TID) | ORAL | 1 refills | Status: DC | PRN

## 2022-09-13 MED ORDER — PROCHLORPERAZINE MALEATE 10 MG PO TABS: 10.0000 mg | ORAL_TABLET | Freq: Four times a day (QID) | ORAL | 1 refills | Status: DC | PRN

## 2022-09-13 MED ORDER — LIDOCAINE-PRILOCAINE 2.5-2.5 % EX CREA: TOPICAL_CREAM | CUTANEOUS | 3 refills | Status: DC

## 2022-09-14 ENCOUNTER — Telehealth: Payer: Self-pay

## 2022-09-14 ENCOUNTER — Telehealth: Payer: Self-pay | Admitting: Pharmacy Technician

## 2022-09-14 ENCOUNTER — Other Ambulatory Visit (HOSPITAL_COMMUNITY): Payer: Self-pay

## 2022-09-14 ENCOUNTER — Encounter: Payer: Self-pay | Admitting: Hematology

## 2022-09-14 DIAGNOSIS — C20 Malignant neoplasm of rectum: Secondary | ICD-10-CM

## 2022-09-14 MED ORDER — CAPECITABINE 500 MG PO TABS: 850.0000 mg/m2 | ORAL_TABLET | Freq: Two times a day (BID) | ORAL | 0 refills | Status: DC

## 2022-09-14 NOTE — Telephone Encounter (Signed)
Oral Oncology Patient Advocate Encounter  Prior Authorization for Capecitabine has been approved.    PA# [id number] Effective dates: 09/14/22 through 09/14/23  Patient must fill at [facility].    Lady Deutscher, CPhT-Adv Oncology Pharmacy Patient [place] Direct Number: [phone]  Fax: [phone]

## 2022-09-14 NOTE — Progress Notes (Signed)
COVID Vaccine Completed:  Date of COVID positive in last 90 days:  PCP - Penni Homans, MD Cardiologist - Rudean Haskell, MD  Chest x-ray - CT chest 07-27-22 Epic EKG - 06-19-22 Epic Stress Test -  ECHO - 07-13-22 Epic Cardiac Cath -  Pacemaker/ICD device last checked: Spinal Cord Stimulator:  Bowel Prep -   Sleep Study - Yes,  CPAP -   Fasting Blood Sugar -  Checks Blood Sugar _____ times a day  Last dose of GLP1 agonist-  N/A GLP1 instructions:  N/A   Last dose of SGLT-2 inhibitors-  N/A SGLT-2 instructions: N/A   Blood Thinner Instructions: Aspirin Instructions: Last Dose:  Activity level:  Can go up a flight of stairs and perform activities of daily living without stopping and without symptoms of chest pain or shortness of breath.  Able to exercise without symptoms  Unable to go up a flight of stairs without symptoms of     Anesthesia review:  CAD, aortic ectasia, LVH, HTN, DM  Patient denies shortness of breath, fever, cough and chest pain at PAT appointment  Patient verbalized understanding of instructions that were given to them at the PAT appointment. Patient was also instructed that they will need to review over the PAT instructions again at home before surgery.

## 2022-09-14 NOTE — Telephone Encounter (Signed)
Oral Oncology Pharmacist Encounter  Received new prescription for Xeloda (capecitabine) for the treatment of colorectal cancer in conjunction with oxalyplatin, planned duration 4 months (6 cycles, 21 days per cycle).  Labs from 09/13/2022 assessed, noted serum creatinine of 1.45 (CrCl 100 mL/min). No changes/adjustments necessary at this time. Prescription dose and frequency assessed for appropriateness.   Current medication list in Epic reviewed, DDI with esomeprazole identified: PPIs can decrease efficacy of Xeloda, recommend to switch to famotidine while on Xeloda.   Evaluated chart and no patient barriers to medication adherence noted.   Patient agreement for treatment documented in MD note on 09/13/2022.  Prescription has been e-scribed to the [facility] for benefits analysis and approval.  Oral Oncology Clinic will continue to follow for insurance authorization (approved at this time), copayment issues, initial counseling and start date (09/27/2022).   [employer] PharmD Candidate [phone]  09/14/2022 12:03 PM Oral Oncology Clinic [phone]

## 2022-09-14 NOTE — Patient Instructions (Signed)
SURGICAL WAITING ROOM VISITATION Patients having surgery or a procedure may have no more than 2 support people in the waiting area - these visitors may rotate.    If the patient needs to stay at the hospital during part of their recovery, the visitor guidelines for inpatient rooms apply. Pre-op nurse will coordinate an appropriate time for 1 support person to accompany patient in pre-op.  This support person may not rotate.    Please refer to the [facility] website for the visitor guidelines for Inpatients (after your surgery is over and you are in a regular room).   Due to an increase in RSV and influenza rates and associated hospitalizations, children ages 32 and under may not visit patients in [place].     Your procedure is scheduled on: 09-21-22   Report to [facility] Main Entrance    Report to admitting at 10:30 AM   Call this number if you have problems the morning of surgery [phone]   Do not eat food or drink liquids :After Midnight.           If you have questions, please contact your surgeon's office.   FOLLOW BOWEL PREP AND ANY ADDITIONAL PRE OP INSTRUCTIONS YOU RECEIVED FROM YOUR SURGEON'S OFFICE!!!     Oral Hygiene is also important to reduce your risk of infection.                                    Remember - BRUSH YOUR TEETH THE MORNING OF SURGERY WITH YOUR REGULAR TOOTHPASTE   Do NOT smoke after Midnight   Take these medicines the morning of surgery with A SIP OF WATER:   Amlodipine  Carvedilol  Esomeprazole  How to Manage Your Diabetes Before and After Surgery  Why is it important to control my blood sugar before and after surgery? Improving blood sugar levels before and after surgery helps healing and can limit problems. A way of improving blood sugar control is eating a healthy diet by:  Eating less sugar and carbohydrates  Increasing activity/exercise  Talking with your doctor about reaching your blood sugar goals High  blood sugars (greater than 180 mg/dL) can raise your risk of infections and slow your recovery, so you will need to focus on controlling your diabetes during the weeks before surgery. Make sure that the doctor who takes care of your diabetes knows about your planned surgery including the date and location.  How do I manage my blood sugar before surgery? Check your blood sugar at least 4 times a day, starting 2 days before surgery, to make sure that the level is not too high or low. Check your blood sugar the morning of your surgery when you wake up and every 2 hours until you get to the Short Stay unit. If your blood sugar is less than 70 mg/dL, you will need to treat for low blood sugar: Do not take insulin. Treat a low blood sugar (less than 70 mg/dL) with  cup of clear juice (cranberry or apple), 4 glucose tablets, OR glucose gel. Recheck blood sugar in 15 minutes after treatment (to make sure it is greater than 70 mg/dL). If your blood sugar is not greater than 70 mg/dL on recheck, call [phone] for further instructions. Report your blood sugar to the short stay nurse when you get to Short Stay.  If you are admitted to the hospital after surgery:  Your blood sugar will be checked by the staff and you will probably be given insulin after surgery (instead of oral diabetes medicines) to make sure you have good blood sugar levels. The goal for blood sugar control after surgery is 80-180 mg/dL.   WHAT DO I DO ABOUT MY DIABETES MEDICATION?  Do not take oral diabetes medicines (pills) the morning of surgery.  DO NOT TAKE THE FOLLOWING 7 DAYS PRIOR TO SURGERY: Ozempic, Wegovy, Rybelsus (Semaglutide), Byetta (exenatide), Bydureon (exenatide ER), Victoza, Saxenda (liraglutide), or Trulicity (dulaglutide) Mounjaro (Tirzepatide) Adlyxin (Lixisenatide), Polyethylene Glycol Loxenatide.  Reviewed and Endorsed by [facility] Patient Education Committee, August 2015                              You  may not have any metal on your body including  jewelry, and body piercing             Do not wear lotions, powders, cologne, or deodorant              Men may shave face and neck.   Do not bring valuables to the hospital. [place].   Contacts, dentures or bridgework may not be worn into surgery.  DO NOT [place]. PHARMACY WILL DISPENSE MEDICATIONS LISTED ON YOUR MEDICATION LIST TO YOU DURING YOUR ADMISSION [place]!    Patients discharged on the day of surgery will not be allowed to drive home.  Someone NEEDS to stay with you for the first 24 hours after anesthesia.              Please read over the following fact sheets you were given: IF [place]  If you received a COVID test during your pre-op visit  it is requested that you wear a mask when out in public, stay away from anyone that may not be feeling well and notify your surgeon if you develop symptoms. If you test positive for Covid or have been in contact with anyone that has tested positive in the last 10 days please notify you surgeon.  [facility] - Preparing for Surgery Before surgery, you can play an important role.  Because skin is not sterile, your skin needs to be as free of germs as possible.  You can reduce the number of germs on your skin by washing with CHG (chlorahexidine gluconate) soap before surgery.  CHG is an antiseptic cleaner which kills germs and bonds with the skin to continue killing germs even after washing. Please DO NOT use if you have an allergy to CHG or antibacterial soaps.  If your skin becomes reddened/irritated stop using the CHG and inform your nurse when you arrive at Short Stay. Do not shave (including legs and underarms) for at least 48 hours prior to the first CHG shower.  You may shave your face/neck.  Please follow these instructions carefully:  1.   Shower with CHG Soap the night before surgery and the  morning of surgery.  2.  If you choose to wash your hair, wash your hair first as usual with your normal  shampoo.  3.  After you shampoo, rinse your hair and body thoroughly to remove the shampoo.  4.  Use CHG as you would any other liquid soap.  You can apply chg directly to the skin and wash.  Gently with a scrungie or clean washcloth.  5.  Apply the CHG Soap to your body ONLY FROM THE NECK DOWN.   Do   not use on face/ open                           Wound or open sores. Avoid contact with eyes, ears mouth and   genitals (private parts).                       Wash face,  Genitals (private parts) with your normal soap.             6.  Wash thoroughly, paying special attention to the area where your    surgery  will be performed.  7.  Thoroughly rinse your body with warm water from the neck down.  8.  DO NOT shower/wash with your normal soap after using and rinsing off the CHG Soap.                9.  Pat yourself dry with a clean towel.            10.  Wear clean pajamas.            11.  Place clean sheets on your bed the night of your first shower and do not  sleep with pets. Day of Surgery : Do not apply any lotions/deodorants the morning of surgery.  Please wear clean clothes to the hospital/surgery center.  FAILURE TO FOLLOW THESE INSTRUCTIONS MAY RESULT IN THE CANCELLATION OF YOUR SURGERY  PATIENT SIGNATURE_________________________________  NURSE SIGNATURE__________________________________  ________________________________________________________________________

## 2022-09-14 NOTE — Progress Notes (Signed)
Surgery orders requested via Epic inbox. °

## 2022-09-14 NOTE — Telephone Encounter (Signed)
Oral Oncology Patient Advocate Encounter   Received notification that prior authorization for Capecitabine is required.   PA submitted on 09/14/22 Key [id number] Status is pending     Robert [name], CPhT-Adv Oncology Pharmacy Patient [place] Direct Number: [phone]  Fax: [phone]

## 2022-09-15 DIAGNOSIS — Z432 Encounter for attention to ileostomy: Secondary | ICD-10-CM | POA: Insufficient documentation

## 2022-09-15 NOTE — Discharge Instructions (Signed)
Supplies from [employer] as needed, notify clinic for assistance

## 2022-09-16 ENCOUNTER — Ambulatory Visit: Payer: Self-pay | Admitting: Surgery

## 2022-09-17 ENCOUNTER — Encounter (HOSPITAL_COMMUNITY): Payer: Self-pay

## 2022-09-17 ENCOUNTER — Encounter: Payer: Self-pay | Admitting: Nurse Practitioner

## 2022-09-17 ENCOUNTER — Encounter (HOSPITAL_COMMUNITY)
Admission: RE | Admit: 2022-09-17 | Discharge: 2022-09-17 | Disposition: A | Discharge status: Home / Self Care | Payer: BLUE CROSS/BLUE SHIELD | Source: Ambulatory Visit | Attending: Surgery | Admitting: Surgery

## 2022-09-17 ENCOUNTER — Other Ambulatory Visit: Payer: Self-pay

## 2022-09-17 VITALS — BP 148/91 | HR 70 | Temp 98.3°F | Resp 16 | Ht 75.0 in | Wt 265.6 lb

## 2022-09-17 DIAGNOSIS — Z01812 Encounter for preprocedural laboratory examination: Secondary | ICD-10-CM | POA: Diagnosis not present

## 2022-09-17 DIAGNOSIS — E119 Type 2 diabetes mellitus without complications: Secondary | ICD-10-CM

## 2022-09-17 LAB — HEMOGLOBIN A1C
Hgb A1c MFr Bld: 7.2 % — ABNORMAL HIGH (ref 4.8–5.6)
Mean Plasma Glucose: 159.94 mg/dL

## 2022-09-17 LAB — GLUCOSE, CAPILLARY: Glucose-Capillary: 145 mg/dL — ABNORMAL HIGH (ref 70–99)

## 2022-09-18 ENCOUNTER — Ambulatory Visit (INDEPENDENT_AMBULATORY_CARE_PROVIDER_SITE_OTHER): Payer: BLUE CROSS/BLUE SHIELD | Admitting: Family Medicine

## 2022-09-18 ENCOUNTER — Encounter: Payer: Self-pay | Admitting: Nurse Practitioner

## 2022-09-18 VITALS — BP 136/78 | HR 68 | Temp 98.0°F | Resp 16 | Ht 75.0 in | Wt 266.4 lb

## 2022-09-18 DIAGNOSIS — K219 Gastro-esophageal reflux disease without esophagitis: Secondary | ICD-10-CM

## 2022-09-18 DIAGNOSIS — E669 Obesity, unspecified: Secondary | ICD-10-CM

## 2022-09-18 DIAGNOSIS — C20 Malignant neoplasm of rectum: Secondary | ICD-10-CM

## 2022-09-18 DIAGNOSIS — E1169 Type 2 diabetes mellitus with other specified complication: Secondary | ICD-10-CM | POA: Diagnosis not present

## 2022-09-18 DIAGNOSIS — I10 Essential (primary) hypertension: Secondary | ICD-10-CM | POA: Diagnosis not present

## 2022-09-18 MED ORDER — COOL BLOOD GLUCOSE TEST STRIPS VI STRP: ORAL_STRIP | 12 refills | Status: AC

## 2022-09-18 MED ORDER — METFORMIN HCL 500 MG PO TABS: 500.0000 mg | ORAL_TABLET | Freq: Every day | ORAL | 1 refills | Status: DC

## 2022-09-18 MED ORDER — SITAGLIPTIN PHOSPHATE 100 MG PO TABS: 100.0000 mg | ORAL_TABLET | Freq: Every day | ORAL | 4 refills | Status: DC

## 2022-09-18 NOTE — Telephone Encounter (Signed)
Oral Chemotherapy Pharmacist Encounter  I spoke with patient for overview of: Xeloda (capecitabine) for the adjuvant treatment of colorectal cancer in conjunction with infusional oxaliplatin, planned duration of 6 cycles (3-4 months).  Counseled patient on administration, dosing, side effects, monitoring, drug-drug interactions, safe handling, storage, and disposal.  Patient will take Xeloda '500mg'$  tablets, 4 tablets (2000 mg) by mouth in AM and 4 tabs (2000 mg) by mouth in PM, within 30 minutes of finishing meals, on days 1-14 of each 21 day cycle.   Oxaliplatin will be infused on day 1 of each 21 day cycle.  Xeloda and oxaliplatin start date: September 28, 2022  Adverse effects include but are not limited to: fatigue, decreased blood counts, GI upset, diarrhea, mouth sores, and hand-foot syndrome.  Patient has anti-emetics on hand and knows to take it if nausea develops. Discussed when to use prochlorperazine versus ondansetron and why he has both of these medications.   Patient will alert the office if he experiences increased ostomy output and indicated that he already has Imodium on-hand.   Reviewed with patient importance of keeping a medication schedule. No barriers to medication adherence identified.  Patient is currently taking esomeprazole for GERD, which can lead to decreased efficacy of capecitabine. Spoke with patient about this DDI and discussed switching to famotidine 20 mg up to BID for the duration of capecitabine treatment. Patient indicated understanding and willingness to switch as soon as possible.  Medication reconciliation performed and medication/allergy list updated.  Insurance authorization for Xeloda has been obtained. Insurance requires capecitabine to be filled and delivered via [facility].   All questions answered.  Robert [name] voiced understanding and appreciation.   Medication education handout placed in mail for patient. Patient knows to call  the office with questions or concerns. Oral Chemotherapy Clinic phone number provided to patient.   [employer] PharmD Candidate [phone] 09/18/2022   2:02 PM Oral Oncology Clinic [phone]

## 2022-09-18 NOTE — Patient Instructions (Addendum)
Ozempic, Trulicity or Mounjaro shots weekly for sugar, discuss with oncology  [place] Multivitamin with minerals

## 2022-09-18 NOTE — Assessment & Plan Note (Addendum)
Drop Metformin to 500 mg daily and add Januvia If sugar does not improve can consier Ozempic or Mounjaro in the future. . minimize simple carbs. Increase exercise as tolerated.

## 2022-09-18 NOTE — Progress Notes (Signed)
Subjective:   By signing my name below, I, [name], attest that this documentation has been prepared under the direction and in the presence of [name], MD., 09/18/2022.     Patient ID: Robert [name], male    DOB: August 31, 1970, 52 y.o.   MRN: [id number]  Chief Complaint  Patient presents with   Follow-up    Follow up   HPI Patient is in today for an office visit. He denies CP/palpitations/SOB/HA/congestion/ fever/chills.  Diabetes Mellitus Type 2 Patient continues taking Metformin 500 mg bid and has been checking his blood glucose daily. He reports that his most recent 7 day average was 123 ng/mL but when he checked yesterday, his blood glucose was 145 ng/mL. He states that his sister does not tolerate Metformin well and he is interested in trying alternative medications such as Januvia, Mounjaro, and Trulicity. Lab Results  Component Value Date   HGBA1C 7.2 (H) 09/17/2022   Rectal Cancer Patient continues to battle stage IIA rectal cancer and is preparing to start a 12-week regimen of chemotherapy. This regimen will comprise of an on and off schedule of 2 weeks of daily pills followed by 2 weeks of infusion. He is planning to attend a chemotherapy class this Thursday, 09/20/2022, with his wife to prepare himself. He underwent a lower anterior resection on 08/23/2022 under Dr. Dema Severin and reports that he has been experiencing episode of fatigue and lightheadedness daily since the procedure. He has been prescribed Ondansetron by his oncologist to use prn for nausea. He has received 6 incisions thus far and will undergo surgery on 09/21/2022 to insert a port.  Past Medical History:  Diagnosis Date   Arthralgia 01/11/2013   Diffuse, b/l Hips R>L Knees, ankles, low back   Arthritis    Back pain 12/08/2015   Cancer ([place])    Coronary artery disease    Diabetes mellitus type 2 in obese ([place]) 11/09/2012   Dyslipidemia 11/09/2012   Esophageal reflux 12/08/2015   Feeling grief  06/09/2020   History of migraine headaches    Hyperglycemia 11/09/2012   Hypertension     Past Surgical History:  Procedure Laterality Date   DIVERTING ILEOSTOMY N/A 08/23/2022   Procedure: DIVERTING LOOP ILEOSTOMY;  Surgeon: Ileana Roup, MD;  Location: [facility];  Service: General;  Laterality: N/A;   EUS N/A 06/21/2022   Procedure: LOWER ENDOSCOPIC ULTRASOUND (EUS);  Surgeon: Irving Copas., MD;  Location: [facility];  Service: Gastroenterology;  Laterality: N/A;   FLEXIBLE SIGMOIDOSCOPY N/A 06/21/2022   Procedure: FLEXIBLE SIGMOIDOSCOPY;  Surgeon: Rush Landmark Telford Nab., MD;  Location: [facility];  Service: Gastroenterology;  Laterality: N/A;   FLEXIBLE SIGMOIDOSCOPY N/A 08/23/2022   Procedure: FLEXIBLE SIGMOIDOSCOPY;  Surgeon: Ileana Roup, MD;  Location: [facility];  Service: General;  Laterality: N/A;   XI ROBOTIC ASSISTED LOWER ANTERIOR RESECTION N/A 08/23/2022   Procedure: XI ROBOTIC ASSISTED LOWER ANTERIOR RESECTION with Introperative assessment of profusion and TAP Block;  Surgeon: Ileana Roup, MD;  Location: [facility];  Service: General;  Laterality: N/A;    Family History  Problem Relation Age of Onset   Arthritis Mother    Hypertension Mother    Diabetes Mother    Cancer Mother    Arthritis Father    Hypertension Father    Diabetes Father    Sleep apnea Father    Obesity Father    Arthritis Maternal Grandmother    Diabetes Maternal Grandmother    Stroke Maternal Grandmother  Arthritis Paternal Grandmother    Diabetes Paternal Grandmother    Cancer Paternal Grandmother        lung, smoker   Diabetes Paternal Grandfather    Heart disease Paternal Grandfather 66       MI   Hyperlipidemia Brother    Hypertension Brother    Diabetes Brother    COPD Maternal Grandfather    Kidney disease Neg Hx     Social History   Socioeconomic History   Marital status: Married    Spouse name: Not on file   Number of children: 4   Years of  education: Not on file   Highest education level: Not on file  Occupational History   Occupation: Training and development officer    Employer: [employer]  Tobacco Use   Smoking status: Former    Packs/day: 1.50    Years: 10.00    Total pack years: 15.00    Types: Cigarettes    Quit date: 08/14/1995    Years since quitting: 27.1   Smokeless tobacco: Never  Vaping Use   Vaping Use: Never used  Substance and Sexual Activity   Alcohol use: Yes    Alcohol/week: 6.0 - 10.0 standard drinks of alcohol    Types: 6 - 10 Cans of beer per week    Comment: 2/day, more in the past   Drug use: Yes    Types: Marijuana    Comment: marijuana 12/11   Sexual activity: Yes    Comment: lives with wife and daughter, no dietary restrictions  Other Topics Concern   Not on file  Social History Narrative   Regular exercise:  Walks 1-2 x weekly   Caffeine use:  3-4 daily   4 children- oldest first marriage 70 (son), 98 son, 41 son, 81 yr old girl   Married   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 90 tablet 1   capecitabine (XELODA) 500 MG tablet Take 4 tablets (2,000 mg total) by mouth 2 (two) times daily after a meal. Take for 14 days on, 7 days off. Repeat every 21 days. 112 tablet 0   carvedilol (COREG) 25 MG tablet Take 1 tablet (25 mg total) by mouth 2 (two) times daily. 180 tablet 3   esomeprazole (NEXIUM) 20 MG capsule Take 20 mg by mouth daily.     Iron, Ferrous Sulfate, 325 (65 Fe) MG TABS Take 325 mg by mouth 2 (two) times daily with a meal. 30 tablet 2    lidocaine-prilocaine (EMLA) cream Apply to affected area once 30 g 3   loperamide (IMODIUM) 2 MG capsule Take 1 capsule (2 mg total) by mouth 2 (two) times daily. 30 capsule 0   losartan (COZAAR) 100 MG tablet TAKE 1 TABLET BY MOUTH EVERY DAY 90 tablet 1   ondansetron (ZOFRAN) 8 MG tablet Take 1 tablet (8 mg total) by mouth every 8 (eight) hours as needed for nausea or vomiting. Start on the third day after chemotherapy.  30 tablet 1   prochlorperazine (COMPAZINE) 10 MG tablet Take 1 tablet (10 mg total) by mouth every 6 (six) hours as needed for nausea or vomiting. 30 tablet 1   tiZANidine (ZANAFLEX) 2 MG tablet Take 0.5-2 tablets (1-4 mg total) by mouth 2 (two) times daily as needed for muscle spasms. (Patient taking differently: Take 2-4 mg by mouth 3 (three) times daily as needed for muscle spasms.) 30 tablet 5   glucose blood (COOL BLOOD GLUCOSE TEST STRIPS) test strip Check blood sugar once daily 100 each 12   metFORMIN (GLUCOPHAGE) 500 MG tablet Take 1 tablet (500 mg total) by mouth 2 (two) times daily. (Patient taking differently: Take 1,000 mg by mouth 2 (two) times daily.) 180 tablet 1   No facility-administered medications prior to visit.    Allergies  Allergen Reactions   Mucinex [Guaifenesin Er] Shortness Of Breath, Itching, Swelling, Dermatitis and Rash    Swelling of hands and feet   Nsaids Shortness Of Breath, Itching, Swelling and Rash    Aleve and Advil    Latex Rash and Other (See Comments)    Cartilage will harden     Review of Systems  Constitutional:  Negative for chills and fever.  HENT:  Negative for congestion.   Respiratory:  Negative for shortness of breath.   Cardiovascular:  Negative for chest pain and palpitations.  Gastrointestinal:  Positive for nausea.  Skin:           Neurological:  Negative for headaches.       (+) fatigue, lightheadedness.       Objective:    Physical Exam Constitutional:      General: He is not in acute distress.     Appearance: Normal appearance. He is normal weight. He is not ill-appearing.  HENT:     Head: Normocephalic and atraumatic.     Right Ear: External ear normal.     Left Ear: External ear normal.     Nose: Nose normal.     Mouth/Throat:     Mouth: Mucous membranes are moist.     Pharynx: Oropharynx is clear.  Eyes:     General:        Right eye: No discharge.        Left eye: No discharge.     Extraocular Movements: Extraocular movements intact.     Conjunctiva/sclera: Conjunctivae normal.     Pupils: Pupils are equal, round, and reactive to light.  Cardiovascular:     Rate and Rhythm: Normal rate and regular rhythm.     Pulses: Normal pulses.     Heart sounds: Normal heart sounds. No murmur heard.    No gallop.  Pulmonary:     Effort: Pulmonary effort is normal. No respiratory distress.     Breath sounds: Normal breath sounds. No wheezing or rales.  Abdominal:     General: Bowel sounds are normal.     Palpations: Abdomen is soft.     Tenderness: There is no abdominal tenderness. There is no guarding.  Musculoskeletal:        General: Normal range of motion.     Cervical back: Normal range of motion.     Right lower leg: No edema.     Left lower leg: No edema.  Skin:    General: Skin is warm and dry.  Neurological:     Mental Status: He is alert and oriented to person, place, and time.  Psychiatric:  Mood and Affect: Mood normal.        Behavior: Behavior normal.        Judgment: Judgment normal.     BP 136/78 (BP Location: Right Arm, Patient Position: Sitting, Cuff Size: Normal)   Pulse 68   Temp 98 F (36.7 C) (Oral)   Resp 16   Ht 6' 3"$  (1.905 m)   Wt 266 lb 6.4 oz (120.8 kg)   SpO2 97%   BMI 33.30 kg/m  Wt Readings from Last 3 Encounters:  09/21/22 268 lb (121.6 kg)  09/18/22 266 lb 6.4 oz (120.8 kg)  09/17/22 265 lb 9.6 oz (120.5 kg)    Diabetic Foot Exam - Simple   No data filed    Lab Results  Component Value Date   WBC 6.6 09/13/2022    HGB 12.5 (L) 09/13/2022   HCT 39.4 09/13/2022   PLT 322 09/13/2022   GLUCOSE 155 (H) 09/13/2022   CHOL 149 06/19/2022   TRIG 143.0 06/19/2022   HDL 37.90 (L) 06/19/2022   LDLCALC 83 06/19/2022   ALT 22 09/13/2022   AST 20 09/13/2022   NA 137 09/13/2022   K 4.4 09/13/2022   CL 106 09/13/2022   CREATININE 1.45 (H) 09/13/2022   BUN 28 (H) 09/13/2022   CO2 24 09/13/2022   TSH 0.69 06/19/2022   PSA 0.35 11/03/2021   HGBA1C 7.2 (H) 09/17/2022   MICROALBUR 0.8 11/03/2021    Lab Results  Component Value Date   TSH 0.69 06/19/2022   Lab Results  Component Value Date   WBC 6.6 09/13/2022   HGB 12.5 (L) 09/13/2022   HCT 39.4 09/13/2022   MCV 77.1 (L) 09/13/2022   PLT 322 09/13/2022   Lab Results  Component Value Date   NA 137 09/13/2022   K 4.4 09/13/2022   CO2 24 09/13/2022   GLUCOSE 155 (H) 09/13/2022   BUN 28 (H) 09/13/2022   CREATININE 1.45 (H) 09/13/2022   BILITOT 0.4 09/13/2022   ALKPHOS 72 09/13/2022   AST 20 09/13/2022   ALT 22 09/13/2022   PROT 7.7 09/13/2022   ALBUMIN 4.3 09/13/2022   CALCIUM 10.0 09/13/2022   ANIONGAP 7 09/13/2022   GFR 79.33 06/19/2022   Lab Results  Component Value Date   CHOL 149 06/19/2022   Lab Results  Component Value Date   HDL 37.90 (L) 06/19/2022   Lab Results  Component Value Date   LDLCALC 83 06/19/2022   Lab Results  Component Value Date   TRIG 143.0 06/19/2022   Lab Results  Component Value Date   CHOLHDL 4 06/19/2022   Lab Results  Component Value Date   HGBA1C 7.2 (H) 09/17/2022      Assessment & Plan:  Diabetes Mellitus Type 2: Januvia has been prescribed, Metformin has been reduced to 500 mg once daily, and test strips have been refilled today.  A referral to Endocrinology has also been made and patient has been informed about about alternate diabetes medications, such Mounjaro, Ozempic, and Trulicity. Problem List Items Addressed This Visit     Diabetes mellitus type 2 in obese ([place]) - Primary    Drop  Metformin to 500 mg daily and add Januvia If sugar does not improve can consier Ozempic or Mounjaro in the future. . minimize simple carbs. Increase exercise as tolerated.      Relevant Medications   sitaGLIPtin (JANUVIA) 100 MG tablet   metFORMIN (GLUCOPHAGE) 500 MG tablet   Other Relevant Orders   Ambulatory referral  to Endocrinology   Esophageal reflux    Avoid offending foods, start probiotics. Do not eat large meals in late evening and consider raising head of bed.        HTN (hypertension)    Well controlled, no changes to meds. Encouraged heart healthy diet such as the DASH diet and exercise as tolerated.        Rectal cancer [facility])    He is following closely with surgeon and oncology and is tolerating his colostomy       Meds ordered this encounter  Medications   sitaGLIPtin (JANUVIA) 100 MG tablet    Sig: Take 1 tablet (100 mg total) by mouth daily.    Dispense:  30 tablet    Refill:  4   glucose blood (COOL BLOOD GLUCOSE TEST STRIPS) test strip    Sig: Check blood sugar once daily    Dispense:  100 each    Refill:  12    One Touch Verio Dx: E11.9   metFORMIN (GLUCOPHAGE) 500 MG tablet    Sig: Take 1 tablet (500 mg total) by mouth daily with breakfast.    Dispense:  90 tablet    Refill:  1   I, Penni Homans, MD, personally preformed the services described in this documentation.  All medical record entries made by the scribe were at my direction and in my presence.  I have reviewed the chart and discharge instructions (if applicable) and agree that the record reflects my personal performance and is accurate and complete. 09/18/2022  I,Mohammed Iqbal,acting as a scribe for Penni Homans, MD.,have documented all relevant documentation on the behalf of Penni Homans, MD,as directed by  Penni Homans, MD while in the presence of Penni Homans, MD.  Penni Homans, MD

## 2022-09-18 NOTE — Telephone Encounter (Signed)
Spoke with [facility], confirmed that Robert [name] has scheduled first Xeloda shipment to be delivered to his home on February 13th, 2024.   Current Xeloda therapy start date is February 16th, 2024.   [facility] PharmD Candidate [phone]

## 2022-09-19 ENCOUNTER — Other Ambulatory Visit: Payer: Self-pay

## 2022-09-20 ENCOUNTER — Inpatient Hospital Stay: Payer: BLUE CROSS/BLUE SHIELD

## 2022-09-20 ENCOUNTER — Ambulatory Visit: Payer: Self-pay | Admitting: Surgery

## 2022-09-20 ENCOUNTER — Other Ambulatory Visit: Payer: Self-pay

## 2022-09-21 ENCOUNTER — Ambulatory Visit (HOSPITAL_COMMUNITY)
Admission: RE | Admit: 2022-09-21 | Discharge: 2022-09-21 | Disposition: A | Discharge status: Home / Self Care | Payer: BLUE CROSS/BLUE SHIELD | Attending: Surgery | Admitting: Surgery

## 2022-09-21 ENCOUNTER — Ambulatory Visit (HOSPITAL_COMMUNITY): Payer: BLUE CROSS/BLUE SHIELD

## 2022-09-21 ENCOUNTER — Other Ambulatory Visit: Payer: Self-pay

## 2022-09-21 ENCOUNTER — Ambulatory Visit (HOSPITAL_COMMUNITY): Payer: BLUE CROSS/BLUE SHIELD | Admitting: Certified Registered"

## 2022-09-21 ENCOUNTER — Encounter (HOSPITAL_COMMUNITY): Payer: Self-pay | Admitting: Surgery

## 2022-09-21 ENCOUNTER — Encounter (HOSPITAL_COMMUNITY)
Admission: RE | Disposition: A | Discharge status: Home / Self Care | Payer: Self-pay | Source: Home / Self Care | Attending: Surgery

## 2022-09-21 DIAGNOSIS — C20 Malignant neoplasm of rectum: Secondary | ICD-10-CM | POA: Diagnosis not present

## 2022-09-21 DIAGNOSIS — I10 Essential (primary) hypertension: Secondary | ICD-10-CM | POA: Diagnosis not present

## 2022-09-21 DIAGNOSIS — I2510 Atherosclerotic heart disease of native coronary artery without angina pectoris: Secondary | ICD-10-CM | POA: Insufficient documentation

## 2022-09-21 DIAGNOSIS — Z7984 Long term (current) use of oral hypoglycemic drugs: Secondary | ICD-10-CM | POA: Insufficient documentation

## 2022-09-21 DIAGNOSIS — E119 Type 2 diabetes mellitus without complications: Secondary | ICD-10-CM

## 2022-09-21 DIAGNOSIS — M1990 Unspecified osteoarthritis, unspecified site: Secondary | ICD-10-CM | POA: Diagnosis not present

## 2022-09-21 DIAGNOSIS — Z87891 Personal history of nicotine dependence: Secondary | ICD-10-CM | POA: Diagnosis not present

## 2022-09-21 HISTORY — PX: PORTACATH PLACEMENT: SHX2246

## 2022-09-21 LAB — GLUCOSE, CAPILLARY
Glucose-Capillary: 124 mg/dL — ABNORMAL HIGH (ref 70–99)
Glucose-Capillary: 126 mg/dL — ABNORMAL HIGH (ref 70–99)

## 2022-09-21 SURGERY — INSERTION, TUNNELED CENTRAL VENOUS DEVICE, WITH PORT
Anesthesia: General | Site: Chest

## 2022-09-21 MED ORDER — ONDANSETRON HCL 4 MG/2ML IJ SOLN
INTRAMUSCULAR | Status: DC | PRN
  Administered 2022-09-21: 4 mg via INTRAVENOUS

## 2022-09-21 MED ORDER — HEPARIN 6000 UNIT IRRIGATION SOLUTION
Freq: Once | Status: AC
  Administered 2022-09-21: 1
  Filled 2022-09-21: qty 6000

## 2022-09-21 MED ORDER — HEPARIN SOD (PORK) LOCK FLUSH 100 UNIT/ML IV SOLN
INTRAVENOUS | Status: AC
  Filled 2022-09-21: qty 5

## 2022-09-21 MED ORDER — HEPARIN SOD (PORK) LOCK FLUSH 100 UNIT/ML IV SOLN
INTRAVENOUS | Status: DC | PRN
  Administered 2022-09-21: 500 [IU] via INTRAVENOUS

## 2022-09-21 MED ORDER — 0.9 % SODIUM CHLORIDE (POUR BTL) OPTIME
TOPICAL | Status: DC | PRN
  Administered 2022-09-21: 1000 mL

## 2022-09-21 MED ORDER — IOHEXOL 300 MG/ML  SOLN
INTRAMUSCULAR | Status: DC | PRN
  Administered 2022-09-21: 3 mL

## 2022-09-21 MED ORDER — OXYCODONE HCL 5 MG/5ML PO SOLN: 5.0000 mg | Freq: Once | ORAL | Status: DC | PRN

## 2022-09-21 MED ORDER — FENTANYL CITRATE PF 50 MCG/ML IJ SOSY: 25.0000 ug | PREFILLED_SYRINGE | INTRAMUSCULAR | Status: DC | PRN

## 2022-09-21 MED ORDER — ORAL CARE MOUTH RINSE: 15.0000 mL | Freq: Once | OROMUCOSAL | Status: AC

## 2022-09-21 MED ORDER — CHLORHEXIDINE GLUCONATE 0.12 % MT SOLN
15.0000 mL | Freq: Once | OROMUCOSAL | Status: AC
  Administered 2022-09-21: 15 mL via OROMUCOSAL

## 2022-09-21 MED ORDER — DEXAMETHASONE SODIUM PHOSPHATE 10 MG/ML IJ SOLN
INTRAMUSCULAR | Status: AC
  Filled 2022-09-21: qty 1

## 2022-09-21 MED ORDER — PROMETHAZINE HCL 25 MG/ML IJ SOLN: 6.2500 mg | INTRAMUSCULAR | Status: DC | PRN

## 2022-09-21 MED ORDER — EPHEDRINE 5 MG/ML INJ
INTRAVENOUS | Status: AC
  Filled 2022-09-21: qty 5

## 2022-09-21 MED ORDER — LIDOCAINE-EPINEPHRINE 1 %-1:100000 IJ SOLN
INTRAMUSCULAR | Status: DC | PRN
  Administered 2022-09-21: 10 mL

## 2022-09-21 MED ORDER — OXYCODONE HCL 5 MG PO TABS: 5.0000 mg | ORAL_TABLET | Freq: Once | ORAL | Status: DC | PRN

## 2022-09-21 MED ORDER — MIDAZOLAM HCL 2 MG/2ML IJ SOLN
INTRAMUSCULAR | Status: DC | PRN
  Administered 2022-09-21: 2 mg via INTRAVENOUS

## 2022-09-21 MED ORDER — PROPOFOL 10 MG/ML IV BOLUS
INTRAVENOUS | Status: DC | PRN
  Administered 2022-09-21: 200 mg via INTRAVENOUS

## 2022-09-21 MED ORDER — LIDOCAINE HCL (PF) 2 % IJ SOLN
INTRAMUSCULAR | Status: AC
  Filled 2022-09-21: qty 5

## 2022-09-21 MED ORDER — ACETAMINOPHEN 500 MG PO TABS
1000.0000 mg | ORAL_TABLET | ORAL | Status: AC
  Administered 2022-09-21: 1000 mg via ORAL
  Filled 2022-09-21: qty 2

## 2022-09-21 MED ORDER — MIDAZOLAM HCL 2 MG/2ML IJ SOLN
INTRAMUSCULAR | Status: AC
  Filled 2022-09-21: qty 2

## 2022-09-21 MED ORDER — DEXAMETHASONE SODIUM PHOSPHATE 10 MG/ML IJ SOLN
INTRAMUSCULAR | Status: DC | PRN
  Administered 2022-09-21: 4 mg via INTRAVENOUS

## 2022-09-21 MED ORDER — CHLORHEXIDINE GLUCONATE CLOTH 2 % EX PADS: 6.0000 | MEDICATED_PAD | Freq: Once | CUTANEOUS | Status: DC

## 2022-09-21 MED ORDER — BUPIVACAINE HCL 0.25 % IJ SOLN
INTRAMUSCULAR | Status: AC
  Filled 2022-09-21: qty 1

## 2022-09-21 MED ORDER — FENTANYL CITRATE (PF) 100 MCG/2ML IJ SOLN
INTRAMUSCULAR | Status: DC | PRN
  Administered 2022-09-21: 25 ug via INTRAVENOUS

## 2022-09-21 MED ORDER — LIDOCAINE 2% (20 MG/ML) 5 ML SYRINGE
INTRAMUSCULAR | Status: DC | PRN
  Administered 2022-09-21: 100 mg via INTRAVENOUS

## 2022-09-21 MED ORDER — ONDANSETRON HCL 4 MG/2ML IJ SOLN
INTRAMUSCULAR | Status: AC
  Filled 2022-09-21: qty 2

## 2022-09-21 MED ORDER — FENTANYL CITRATE (PF) 100 MCG/2ML IJ SOLN
INTRAMUSCULAR | Status: AC
  Filled 2022-09-21: qty 2

## 2022-09-21 MED ORDER — EPHEDRINE SULFATE-NACL 50-0.9 MG/10ML-% IV SOSY
PREFILLED_SYRINGE | INTRAVENOUS | Status: DC | PRN
  Administered 2022-09-21: 10 mg via INTRAVENOUS

## 2022-09-21 MED ORDER — CEFAZOLIN IN SODIUM CHLORIDE 3-0.9 GM/100ML-% IV SOLN
3.0000 g | INTRAVENOUS | Status: AC
  Administered 2022-09-21: 3 g via INTRAVENOUS
  Filled 2022-09-21: qty 100

## 2022-09-21 MED ORDER — TRAMADOL HCL 50 MG PO TABS: 50.0000 mg | ORAL_TABLET | Freq: Four times a day (QID) | ORAL | 0 refills | Status: AC | PRN

## 2022-09-21 MED ORDER — PROPOFOL 10 MG/ML IV BOLUS
INTRAVENOUS | Status: AC
  Filled 2022-09-21: qty 20

## 2022-09-21 MED ORDER — LACTATED RINGERS IV SOLN: INTRAVENOUS | Status: DC

## 2022-09-21 MED ORDER — LIDOCAINE-EPINEPHRINE 1 %-1:100000 IJ SOLN
INTRAMUSCULAR | Status: AC
  Filled 2022-09-21: qty 1

## 2022-09-21 SURGICAL SUPPLY — 37 items
BAG COUNTER SPONGE SURGICOUNT (BAG) IMPLANT
BAG DECANTER FOR FLEXI CONT (MISCELLANEOUS) ×1 IMPLANT
BLADE SURG 15 STRL LF DISP TIS (BLADE) ×1 IMPLANT
BLADE SURG 15 STRL SS (BLADE) ×1
BLADE SURG SZ11 CARB STEEL (BLADE) ×1 IMPLANT
CHLORAPREP W/TINT 26 (MISCELLANEOUS) ×1 IMPLANT
COVER PROBE U/S 5X48 (MISCELLANEOUS) ×1 IMPLANT
DERMABOND ADVANCED .7 DNX12 (GAUZE/BANDAGES/DRESSINGS) IMPLANT
DRAPE C-ARM 42X120 X-RAY (DRAPES) ×1 IMPLANT
DRAPE LAPAROSCOPIC ABDOMINAL (DRAPES) ×1 IMPLANT
DRSG TEGADERM 2-3/8X2-3/4 SM (GAUZE/BANDAGES/DRESSINGS) IMPLANT
DRSG TEGADERM 4X4.75 (GAUZE/BANDAGES/DRESSINGS) IMPLANT
ELECT REM PT RETURN 15FT ADLT (MISCELLANEOUS) ×1 IMPLANT
GAUZE 4X4 16PLY ~~LOC~~+RFID DBL (SPONGE) ×1 IMPLANT
GAUZE SPONGE 4X4 12PLY STRL (GAUZE/BANDAGES/DRESSINGS) IMPLANT
GLOVE BIO SURGEON STRL SZ7.5 (GLOVE) ×1 IMPLANT
GLOVE INDICATOR 8.0 STRL GRN (GLOVE) ×1 IMPLANT
GOWN STRL REUS W/ TWL XL LVL3 (GOWN DISPOSABLE) ×1 IMPLANT
GOWN STRL REUS W/TWL XL LVL3 (GOWN DISPOSABLE) ×1
KIT BASIN OR (CUSTOM PROCEDURE TRAY) ×1 IMPLANT
KIT PORT POWER 8FR ISP CVUE (Port) ×1 IMPLANT
KIT TURNOVER KIT A (KITS) IMPLANT
NDL HYPO 25X1 1.5 SAFETY (NEEDLE) ×1 IMPLANT
NEEDLE HYPO 25X1 1.5 SAFETY (NEEDLE) ×1 IMPLANT
NS IRRIG 1000ML POUR BTL (IV SOLUTION) ×1 IMPLANT
PACK BASIC VI WITH GOWN DISP (CUSTOM PROCEDURE TRAY) ×1 IMPLANT
PENCIL SMOKE EVACUATOR (MISCELLANEOUS) ×1 IMPLANT
SPIKE FLUID TRANSFER (MISCELLANEOUS) ×1 IMPLANT
STRIP CLOSURE SKIN 1/2X4 (GAUZE/BANDAGES/DRESSINGS) IMPLANT
SUT MNCRL AB 4-0 PS2 18 (SUTURE) ×1 IMPLANT
SUT PROLENE 2 0 SH DA (SUTURE) ×1 IMPLANT
SUT VIC AB 3-0 SH 27 (SUTURE) ×1
SUT VIC AB 3-0 SH 27XBRD (SUTURE) ×1 IMPLANT
SYR 10ML LL (SYRINGE) ×1 IMPLANT
SYR 20ML LL LF (SYRINGE) ×2 IMPLANT
TOWEL OR 17X26 10 PK STRL BLUE (TOWEL DISPOSABLE) ×1 IMPLANT
TOWEL OR NON WOVEN STRL DISP B (DISPOSABLE) ×1 IMPLANT

## 2022-09-21 NOTE — Anesthesia Preprocedure Evaluation (Addendum)
Anesthesia Evaluation  Patient identified by MRN, date of birth, ID band Patient awake    Reviewed: Allergy & Precautions, NPO status , Patient's Chart, lab work & pertinent test results, reviewed documented beta blocker date and time   History of Anesthesia Complications Negative for: history of anesthetic complications  Airway Mallampati: III  TM Distance: >3 FB Neck ROM: Full   Comment: Previous grade II view with MAC 4, easy mask Dental  (+)    Pulmonary neg shortness of breath, neg sleep apnea, neg COPD, neg recent URI, former smoker   Pulmonary exam normal breath sounds clear to auscultation       Cardiovascular hypertension, Pt. on medications and Pt. on home beta blockers (-) angina + CAD  (-) Past MI and (-) Cardiac Stents (-) dysrhythmias  Rhythm:Regular Rate:Normal  HLD  TTE 07/13/2022: IMPRESSIONS     1. Left ventricular ejection fraction, by estimation, is 60 to 65%. The  left ventricle has normal function. The left ventricle has no regional  wall motion abnormalities. The left ventricular internal cavity size was  mildly dilated. There is severe left  ventricular hypertrophy. Left ventricular diastolic parameters are  consistent with Grade II diastolic dysfunction (pseudonormalization).   2. Right ventricular systolic function is normal. The right ventricular  size is mildly enlarged.   3. Left atrial size was moderately dilated.   4. The mitral valve is normal in structure. No evidence of mitral valve  regurgitation. No evidence of mitral stenosis.   5. The aortic valve is normal in structure. Aortic valve regurgitation is  moderate. No aortic stenosis is present. Aortic regurgitation PHT measures  487 msec. Aortic valve area, by VTI measures 3.18 cm. Aortic valve mean  gradient measures 8.5 mmHg.  Aortic valve Vmax measures 2.09 m/s.   6. Aortic dilatation noted. 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   7. The inferior vena cava is dilated in size with <50% respiratory  variability, suggesting right atrial pressure of 15 mmHg.     Neuro/Psych  Headaches, neg Seizures    GI/Hepatic Neg liver ROS,GERD  Medicated,,Rectal cancer   Endo/Other  diabetes, Type 2, Oral Hypoglycemic Agents    Renal/GU negative Renal ROS     Musculoskeletal  (+) Arthritis ,    Abdominal  (+) + obese  Peds  Hematology negative hematology ROS (+)   Anesthesia Other Findings   Reproductive/Obstetrics                             Anesthesia Physical Anesthesia Plan  ASA: 3  Anesthesia Plan: General   Post-op Pain Management:    Induction: Intravenous  PONV Risk Score and Plan: 2 and Ondansetron and Treatment may vary due to age or medical condition  Airway Management Planned: LMA  Additional Equipment:   Intra-op Plan:   Post-operative Plan: Extubation in OR  Informed Consent: I have reviewed the patients History and Physical, chart, labs and discussed the procedure including the risks, benefits and alternatives for the proposed anesthesia with the patient or authorized representative who has indicated his/her understanding and acceptance.     Dental advisory given  Plan Discussed with: CRNA and Anesthesiologist  Anesthesia Plan Comments: (Risks of general anesthesia discussed including, but not limited to, sore throat, hoarse voice, chipped/damaged teeth, injury to vocal cords, nausea and vomiting, allergic reactions,  lung infection, heart attack, stroke, and death. All questions answered. )        Anesthesia Quick Evaluation

## 2022-09-21 NOTE — Anesthesia Procedure Notes (Signed)
Procedure Name: LMA Insertion Date/Time: 09/21/2022 12:13 PM  Performed by: Niel Hummer, CRNAPre-anesthesia Checklist: Patient identified, Emergency Drugs available, Suction available and Patient being monitored Patient Re-evaluated:Patient Re-evaluated prior to induction Oxygen Delivery Method: Circle system utilized Preoxygenation: Pre-oxygenation with 100% oxygen Induction Type: IV induction LMA: LMA with gastric port inserted LMA Size: 4.0 Number of attempts: 1 Dental Injury: Teeth and Oropharynx as per pre-operative assessment

## 2022-09-21 NOTE — Anesthesia Postprocedure Evaluation (Signed)
Anesthesia Post Note  Patient: Robert [name]  Procedure(s) Performed: INSERTION PORT-A-CATH WITH ULTRASOUND GUIDANCE (Chest)     Patient location during evaluation: PACU Anesthesia Type: General Level of consciousness: awake Pain management: pain level controlled Vital Signs Assessment: post-procedure vital signs reviewed and stable Respiratory status: spontaneous breathing, nonlabored ventilation and respiratory function stable Cardiovascular status: blood pressure returned to baseline and stable Postop Assessment: no apparent nausea or vomiting Anesthetic complications: no   No notable events documented.  Last Vitals:  Vitals:   09/21/22 1330 09/21/22 1345  BP: 131/88 (!) 149/95  Pulse: (!) 53 62  Resp: 17 18  Temp:    SpO2: 99% 94%    Last Pain:  Vitals:   09/21/22 1051  TempSrc: Oral                 Nilda Simmer

## 2022-09-21 NOTE — Op Note (Signed)
09/21/2022  1:25 PM  PATIENT:  Robert [name]  52 y.o. male  Patient Care Team: [name], MD as PCP - General (Family Medicine) Werner Lean, MD as PCP - Cardiology (Cardiology) Truitt Merle, MD as Consulting Physician (Oncology) Alla Feeling, NP as Nurse Practitioner (Oncology) Royston Bake, RN as Oncology Nurse Navigator (Oncology)  PRE-OPERATIVE DIAGNOSIS:  Rectal cancer, need for port-a-catheter for chemotherapy  POST-OPERATIVE DIAGNOSIS:  Same  PROCEDURE:   Placement of port-a-catheter - right internal jugular vein with fluoroscopy Ultrasound guided access of right internal jugular vein (see photos below)  SURGEON:  Sharon Mt. Dheeraj Hail, MD  ANESTHESIA:   local and general  COUNTS:  Sponge, needle and instrument counts were reported correct x2 at the conclusion of the operation.  EBL: 5 mL  DRAINS: None  SPECIMEN: None  COMPLICATIONS: None  FINDINGS: Standard venous anatomy the right neck.  Ultrasound-guided access utilized to access the right internal jugular vein.  Portacatheter was placed using the standard technique with fluoroscopic assistance.  At conclusion, tip of tubing rests at the cavoatrial junction.  DISPOSITION: PACU in satisfactory condition  DESCRIPTION: The patient was identified in preop holding and taken to the OR where he was placed on the operating room table. SCDs were placed. General anesthesia was induced without difficulty with LMA.  Pressure points were padded.  He was positioned supine with the right arm tucked.  The head is gently tilted to the left without difficulty or resistance.  He was then prepped and draped in the usual sterile fashion. A surgical timeout was performed indicating the correct patient, procedure, positioning and need for preoperative antibiotics.   The right neck is evaluated with an ultrasound.  The internal jugular vein is readily identified and well-distended.  He was positioned in Trendelenburg.  Under  ultrasound guidance, the right internal jugular vein is accessed with her access needle on the first attempt.  A J-wire was brought onto the field and advanced into the jugular vein.  The needle was removed.  Using ultrasound, were able to confirm that the wire rests within the internal jugular vein well below the puncture site down to the level of clavicle and remains in that location.    Fluoroscopy was then utilized and confirms that the J-wire rests in the right hemithorax consistent with a venous path.  A peel-away sheath is then placed over the wire under fluoroscopic visualization.  The J-wire and introducer sheath/dilator are removed.  The port tubing is flushed and brought onto the field.  This is then advanced down the peel-away sheath to approximately 25 cm.  This sheath is then cracked and peeled away.  1 cc of Omnipaque is instilled into the tubing.  Under fluoroscopy, able to confirm that this rests in the right hemithorax.  Planned location of the port site is identified on the right chest.  The area is anesthetized with local anesthetic-1% lidocaine with epinephrine.  The planned location of the tunneling is identified and the subcutaneous tissue was also infiltrated with local anesthetic care.  An incision was made at the level of the planned port placement.  Subcutaneous tissues and divided with electrocautery.  The port pocket was created by dividing underlying subcutaneous tissue electrocautery.  Hemostasis was then verified.  A tunnel Puller is then brought onto the field and in a shallow subcutaneous position placed over the clavicle and to the level of the right neck puncture site.  The tubing was then attached to the tunneler and  pulled back into the port pocket.  The portacatheter is brought onto the field.  2-0 Prolene sutures were then used to anchor the portacatheter to the pectoralis fascia.  An additional 1 cc of Omnipaque was injected in the tubing and under fluoroscopic  guidance, the tubing is pulled back until the tip of the rest at the cavoatrial junction.  The port connector is applied to the tubing and the tubing is cut at the location where will be attached to the portacatheter.  The components were then mated in the locking clip is slid over securing the tubing onto the portacatheter.  The Prolene sutures were then tied.  The pocket was reinspected and noted to be hemostatic.  The portacatheter is accessed and flushes and aspirates freely.  It is then locked with heparinized saline.  The wound was then closed in layers using 3-0 Vicryl deep dermal sutures followed by running 4-0 Monocryl subcuticular suture.  All sponge, needle, and instrument counts were reported correct.  The wounds were then washed and dried.  Dermabond was applied to both the neck site as well as the portacatheter site.  He was then awakened from anesthesia, extubated, and transferred to a stretcher for transport to recovery in satisfactory condition.  A postprocedure chest x-ray has been ordered.

## 2022-09-21 NOTE — Transfer of Care (Signed)
Immediate Anesthesia Transfer of Care Note  Patient: Robert [name]  Procedure(s) Performed: INSERTION PORT-A-CATH WITH ULTRASOUND GUIDANCE (Chest)  Patient Location: PACU  Anesthesia Type:General  Level of Consciousness: drowsy  Airway & Oxygen Therapy: Patient Spontanous Breathing and Patient connected to face mask oxygen  Post-op Assessment: Report given to RN, Post -op Vital signs reviewed and stable, and Patient moving all extremities X 4  Post vital signs: Reviewed and stable  Last Vitals:  Vitals Value Taken Time  BP 129/86   Temp    Pulse 57 09/21/22 1325  Resp 18 09/21/22 1325  SpO2 96 % 09/21/22 1325  Vitals shown include unvalidated device data.  Last Pain:  Vitals:   09/21/22 1051  TempSrc: Oral         Complications: No notable events documented.

## 2022-09-21 NOTE — Discharge Instructions (Addendum)
POST OP INSTRUCTIONS  DIET: As tolerated. Follow a light bland diet the first 24 hours after arrival home, such as soup, liquids, crackers, etc.  Be sure to include lots of fluids daily.  Avoid fast food or heavy meals as your are more likely to get nauseated.  Eat a low fat the next few days after surgery.  Take your usually prescribed home medications unless otherwise directed.  PAIN CONTROL: Pain is best controlled by a usual combination of three different methods TOGETHER: Ice/Heat Over the counter pain medication Prescription pain medication Most patients will experience some swelling and bruising around the surgical site.  Ice packs or heating pads (30-60 minutes up to 6 times a day) will help. Some people prefer to use ice alone, heat alone, alternating between ice & heat.  Experiment to what works for you.  Swelling and bruising can take several weeks to resolve.   It is helpful to take an over-the-counter pain medication regularly for the first few weeks: Ibuprofen (Motrin/Advil) - 275m tabs - take 3 tabs (6035m every 6 hours as needed for pain Acetaminophen (Tylenol) - you may take 65034mvery 6 hours as needed. You can take this with motrin as they act differently on the body. If you are taking a narcotic pain medication that has acetaminophen in it, do not take over the counter tylenol at the same time.  Iii. NOTE: You may take both of these medications together - most patients  find it most helpful when alternating between the two (i.e. Ibuprofen at 6am,  tylenol at 9am, ibuprofen at 12pm ...) Marland Kitchen  prescription for pain medication should be given to you upon discharge.  Take your pain medication as prescribed if your pain is not adequatly controlled with the over-the-counter pain reliefs mentioned above.  Avoid getting constipated.  Between the surgery and the pain medications, it is common to experience some constipation.  Increasing fluid intake and taking a fiber supplement (such as  Metamucil, Citrucel, FiberCon, MiraLax, etc) 1-2 times a day regularly will usually help prevent this problem from occurring.  A mild laxative (prune juice, Milk of Magnesia, MiraLax, etc) should be taken according to package directions if there are no bowel movements after 48 hours.    Dressing: Your incisions are covered in Dermabond which is like sterile superglue for the skin. This will come off on it's own in a couple weeks. It is waterproof and you may bathe normally starting the day after your surgery in a shower. Avoid baths/pools/lakes/oceans until your wounds have fully healed.  ACTIVITIES as tolerated:   You may resume regular (light) daily activities beginning the next day--such as daily self-care, walking, climbing stairs--gradually increasing activities as tolerated.  If you can walk 30 minutes without difficulty, it is safe to try more intense activity such as jogging, treadmill, bicycling, low-impact aerobics.  DO NOT PUSH THROUGH PAIN.  Let pain be your guide: If it hurts to do something, don't do it. You may drive when you are no longer taking prescription pain medication, you can comfortably wear a seatbelt, and you can safely maneuver your car and apply brakes. Avoid extreme over the shoulder exercise for the next month   FOLLOW UP in our office Please call [facility] at [phone] to set up an appointment to see your surgeon in the office for a follow-up appointment approximately 2 weeks after your surgery. Make sure that you call for this appointment the day you arrive home to insure a convenient appointment  time.  9. If you have disability or family leave forms that need to be completed, you may have them completed by your primary care physician's office; for return to work instructions, please ask our office staff and they will be happy to assist you in obtaining this documentation   When to call us [phone]: Poor pain control Reactions / problems with new medications  (rash/itching, etc)  Fever over 101.5 F (38.5 C) Inability to urinate Nausea/vomiting Worsening swelling or bruising Continued bleeding from incision. Increased pain, redness, or drainage from the incision  The clinic staff is available to answer your questions during regular business hours (8:30am-5pm).  Please don't hesitate to call and ask to speak to one of our nurses for clinical concerns.   A surgeon from [facility] is always on call at the hospitals   If you have a medical emergency, go to the nearest emergency room or call 911.  [facility] [address] [place], [place], [place]  [address] MAIN: [phone] FAX: [phone] [email or web address]

## 2022-09-21 NOTE — H&P (Signed)
CC: Here today for port-a-catheter placement  HPI: Robert [name] is an 52 y.o. male with history of HTN, DM, GERD, CAD, whom is seen in the office today as a referral by Dr. Pasty Arch for evaluation of rectal cancer  Colonoscopy with Dr. Fuller Plan 05/09/22 - Five, 5-7 mm polyps removed Proximal rectal tumor, biopsied, submucosal injection tattoo - area proximal to mass tattoo'd x2 Internal hemorrhoids  PATH - Tubular adenomas from poylps; rectal mass - invasive mod diff adenoCA  CT CAP 05/18/22 - 1. Known primary malignancy is not well visualized. Correlate with recent colonoscopy. 2. No evidence of metastatic disease in the chest, abdomen or pelvis. 3. Mildly enlarged bilateral inguinal lymph nodes, likely reactive. 4. 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 Recommend annual imaging followup by CTA or MRA. This recommendation follows 2010 ACCF/AHA/AATS/ACR/ASA/SCA/SCAI/SIR/STS/SVM Guidelines for the Diagnosis and Management of Patients with Thoracic Aortic Disease. Circulation. 2010; [id number]. 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 Severe coronary artery calcifications of the LAD and circumflex. 6. Aortic Atherosclerosis (ICD10-I70.0).  MRI Pelvis 05/28/22 - Assuming a pathologic diagnosis of rectal adenocarcinoma, local stage by imaging is:  T stage- T1/T2  N stage- N0  Distance from tumor to the internal anal sphincter is 4.6 cm.  He notes history of approximately 2 to 3 years of intermittent dark red blood per rectum that coats the stool. Generally not bright or dripping in the toilet bowl. This resolved for some time at which point he had forgotten about it. More recently, had increased again.  Referred to see [place] for evaluation. Of note, his case was presented at our multidisciplinary tumor board 05/30/2022. Consensus recommendation was for EUS for further local staging categorization to help delineate if this is actually a T1 vs T2 lesion.  He was recently  started on iron by Dr. Fuller Plan for possible iron deficiency anemia. Hemoglobin is 12.5 on 9/27. This was down from 14.6 back in March.  Underwent EUS with Dr. Rush Landmark 06/21/2022: FLEX Impression: - Diverticulosis in the recto-sigmoid colon and in the sigmoid colon. - Malignant tumor in the proximal rectum (11-14 cm) - Non-bleeding non-thrombosed internal hemorrhoids. EUS Impression: - Rectal mass was visualized endosonographically. It invades the muscularis propria but does not extend through it. A tissue diagnosis was obtained prior to this exam. This is consistent with adenocarcinoma. This was staged uT2 uN0. - No malignant-appearing lymph nodes were visualized endosonographically in the perirectal region and in the left iliac region. - The internal anal sphincter was visualized endosonographically and appeared normal.  Case was reviewed with our multidisciplinary board and consensus was for upfront surgery given this endoscopic ultrasound finding.  OR 08/23/22 Robotic assisted low anterior resection with diverting loop ileostomy Diagnostic flexible sigmoidoscopy - necessary to confirm tumor location Intraoperative assessment of perfusion using ICG fluorescence imaging Bilateral transversus abdominus plane (TAP) blocks  FINDINGS: Tattoo in proximal rectum. Flexible sigmoidoscope demonstrates the fungiating mass to be located distal to this along the right posterior wall. With the endoscope in place, we are able to orient our distal point of transection of ~2 cm with sufficient distal margin. Narrow male pelvis increasing the complexity of the dissection. No obvious metastatic disease on visceral or parietal peritoneum or liver. A well perfused, tension free, hemostatic, air tight 31 mm EEA colorectal anastomosis fashioned 4 cm from the anal verge by a straightened flexible sigmoidoscopy (approximately 2 cm from the top of the sphincter complex).  Admitted postoperatively and recovered well.  He was discharged home 08/26/2022.  PATH FINAL MICROSCOPIC DIAGNOSIS:  A. COLON, RECTOSIGMOID, RESECTION: - Invasive moderately differentiated adenocarcinoma, 2.5 cm, involving rectum - Carcinoma invades focally into the perirectal adipose tissue - Resection margins are negative for carcinoma - Negative for lymphovascular invasion - Fourteen lymph nodes, negative for carcinoma (0/14) - See oncology table  B. DISTAL MARGIN DONUT: - Rectal donut, negative for carcinoma  IHC Expression - preserved  Case was presented at our multidisciplinary tumor board. Follow-up with medical oncology and referral was placed. He has an appointment scheduled to see medical oncology 09/13/2022. He is doing quite well at home. Occasional dizziness a couple times per week. No nausea/vomiting. Try to eat smaller more frequent meals as opposed to 2 or 3 big meals a day. Ostomy output has thickened nicely and is toothpaste consistency. He has been off Imodium now since a week after going home. He wakes up 1-2 times per night to empty his ostomy bag. They have not been measuring the output per se but it has been thick and he is voiding at least 5-6 times per day and he has pale/clear urine.  INTERVAL HX Denies any changes in his health or health history since we met in the office. Planning underway for systemic adjuvant chemotherapy for his locally advanced rectal cancer.  Denies any complaints at present.  Only change being that he is diabetes medications have been changed and his blood sugar has been under seemingly better control.   PMH: HTN, DM (on metformin), CAD, GERD, Iron deficiency anemia,  PSH: Denies  FHx: Father had a brain tumor behind his sinuses. Denies any known family history of colorectal, breast, endometrial or ovarian cancer  Social Hx: Reformed smoker, quit 1997. Social EtOH use. Denies illicit drug use. Works at [employer]. He is here today with his wife.   Past Medical History:  Diagnosis Date    Arthralgia 01/11/2013   Diffuse, b/l Hips R>L Knees, ankles, low back   Arthritis    Back pain 12/08/2015   Cancer ([place])    Coronary artery disease    Diabetes mellitus type 2 in obese ([place]) 11/09/2012   Dyslipidemia 11/09/2012   Esophageal reflux 12/08/2015   Feeling grief 06/09/2020   History of migraine headaches    Hyperglycemia 11/09/2012   Hypertension     Past Surgical History:  Procedure Laterality Date   DIVERTING ILEOSTOMY N/A 08/23/2022   Procedure: DIVERTING LOOP ILEOSTOMY;  Surgeon: Ileana Roup, MD;  Location: [facility];  Service: General;  Laterality: N/A;   EUS N/A 06/21/2022   Procedure: LOWER ENDOSCOPIC ULTRASOUND (EUS);  Surgeon: Irving Copas., MD;  Location: [facility];  Service: Gastroenterology;  Laterality: N/A;   FLEXIBLE SIGMOIDOSCOPY N/A 06/21/2022   Procedure: FLEXIBLE SIGMOIDOSCOPY;  Surgeon: Rush Landmark Telford Nab., MD;  Location: [facility];  Service: Gastroenterology;  Laterality: N/A;   FLEXIBLE SIGMOIDOSCOPY N/A 08/23/2022   Procedure: FLEXIBLE SIGMOIDOSCOPY;  Surgeon: Ileana Roup, MD;  Location: [facility];  Service: General;  Laterality: N/A;   XI ROBOTIC ASSISTED LOWER ANTERIOR RESECTION N/A 08/23/2022   Procedure: XI ROBOTIC ASSISTED LOWER ANTERIOR RESECTION with Introperative assessment of profusion and TAP Block;  Surgeon: Ileana Roup, MD;  Location: [facility];  Service: General;  Laterality: N/A;    Family History  Problem Relation Age of Onset   Arthritis Mother    Hypertension Mother    Diabetes Mother    Cancer Mother    Arthritis Father    Hypertension Father    Diabetes Father  Sleep apnea Father    Obesity Father    Arthritis Maternal Grandmother    Diabetes Maternal Grandmother    Stroke Maternal Grandmother    Arthritis Paternal Grandmother    Diabetes Paternal Grandmother    Cancer Paternal Grandmother        lung, smoker   Diabetes Paternal Grandfather    Heart disease Paternal  Grandfather 39       MI   Hyperlipidemia Brother    Hypertension Brother    Diabetes Brother    COPD Maternal Grandfather    Kidney disease Neg Hx     Social:  reports that he quit smoking about 27 years ago. His smoking use included cigarettes. He has a 15.00 pack-year smoking history. He has never used smokeless tobacco. He reports current alcohol use of about 6.0 - 10.0 standard drinks of alcohol per week. He reports current drug use. Drug: Marijuana.  Allergies:  Allergies  Allergen Reactions   Mucinex [Guaifenesin Er] Shortness Of Breath, Itching, Swelling, Dermatitis and Rash    Swelling of hands and feet   Nsaids Shortness Of Breath, Itching, Swelling and Rash    Aleve and Advil    Latex Rash and Other (See Comments)    Cartilage will harden     Medications: I have reviewed the patient's current medications.  No results found for this or any previous visit (from the past 48 hour(s)).  No results found.   PE There were no vitals taken for this visit. Constitutional: NAD; conversant Eyes: Moist conjunctiva Lungs: Normal respiratory effort CV: RRR Psychiatric: Appropriate affect  No results found for this or any previous visit (from the past 48 hour(s)).  No results found.  A/P: Robert [name] is an 52 y.o. male with hx of HTN, DM, GERD here for evaluation of newly diagnosed cmriT1/T2N0 rectal cancer - right lateral wall, mid rectum CT CAP 05/17/22 -   Cmri 05/29/22 - T1/T2N0, 4.6 cm from IAS, 10.1 cm from   ceusT2N0 06/21/22 (Dr. Rush Landmark)  MDT --> recommendation made for upfront surgery, LAR. OR 08/23/22 robotic LAR/DLI pT3N0 moderately differentiated adenocarcinoma; margins negative  -Cardiology & TCTS cleared for surgery, known ascending aortic aneurysm  -Postoperatively, case reviewed at MDT. Consensus recommendation for discussions with oncology for adjuvant treatment.   -Medical oncology has asked for Port-A-Cath placement.  We spent time today  reviewing the procedure-placement of port-a-catheter under ultrasound and fluoroscopic guidance.  The planned procedure including technical aspects, material risks (including, but not limited to, pain, bleeding, pneumothorax, hemothorax, placement of chest tube, damage to surrounding structures including blood vessels/arteries, need for additional procedures, pneumonia, heart attack, stroke, death) benefits and alternatives were reviewed.  All of his and his wife's questions were answered to their satisfaction, they expressed understanding, and granted consent to proceed with the surgery.  Nadeen Landau, [facility] [place]

## 2022-09-22 ENCOUNTER — Other Ambulatory Visit: Payer: Self-pay

## 2022-09-22 NOTE — Assessment & Plan Note (Signed)
Well controlled, no changes to meds. Encouraged heart healthy diet such as the DASH diet and exercise as tolerated.  

## 2022-09-22 NOTE — Assessment & Plan Note (Signed)
He is following closely with surgeon and oncology and is tolerating his colostomy

## 2022-09-22 NOTE — Assessment & Plan Note (Signed)
Avoid offending foods, start probiotics. Do not eat large meals in late evening and consider raising head of bed.  

## 2022-09-23 ENCOUNTER — Encounter (HOSPITAL_COMMUNITY): Payer: Self-pay | Admitting: Surgery

## 2022-09-24 ENCOUNTER — Other Ambulatory Visit: Payer: Self-pay | Admitting: Hematology

## 2022-09-24 DIAGNOSIS — C20 Malignant neoplasm of rectum: Secondary | ICD-10-CM

## 2022-09-26 ENCOUNTER — Encounter: Payer: Self-pay | Admitting: Nurse Practitioner

## 2022-09-26 ENCOUNTER — Other Ambulatory Visit (HOSPITAL_COMMUNITY): Payer: Self-pay

## 2022-09-27 ENCOUNTER — Emergency Department (HOSPITAL_COMMUNITY): Payer: BLUE CROSS/BLUE SHIELD

## 2022-09-27 ENCOUNTER — Other Ambulatory Visit: Payer: Self-pay

## 2022-09-27 ENCOUNTER — Encounter: Payer: Self-pay | Admitting: Nurse Practitioner

## 2022-09-27 ENCOUNTER — Encounter (HOSPITAL_COMMUNITY): Payer: Self-pay | Admitting: Internal Medicine

## 2022-09-27 ENCOUNTER — Inpatient Hospital Stay (HOSPITAL_COMMUNITY)
Admission: EM | Admit: 2022-09-27 | Discharge: 2022-09-30 | DRG: 683 | Disposition: A | Discharge status: Home / Self Care | Payer: BLUE CROSS/BLUE SHIELD | Attending: Internal Medicine | Admitting: Internal Medicine

## 2022-09-27 DIAGNOSIS — Z959 Presence of cardiac and vascular implant and graft, unspecified: Secondary | ICD-10-CM

## 2022-09-27 DIAGNOSIS — Z1152 Encounter for screening for COVID-19: Secondary | ICD-10-CM

## 2022-09-27 DIAGNOSIS — Z87891 Personal history of nicotine dependence: Secondary | ICD-10-CM

## 2022-09-27 DIAGNOSIS — E669 Obesity, unspecified: Secondary | ICD-10-CM | POA: Diagnosis present

## 2022-09-27 DIAGNOSIS — K911 Postgastric surgery syndromes: Secondary | ICD-10-CM | POA: Diagnosis present

## 2022-09-27 DIAGNOSIS — Z823 Family history of stroke: Secondary | ICD-10-CM

## 2022-09-27 DIAGNOSIS — E1165 Type 2 diabetes mellitus with hyperglycemia: Secondary | ICD-10-CM | POA: Diagnosis present

## 2022-09-27 DIAGNOSIS — Z8261 Family history of arthritis: Secondary | ICD-10-CM

## 2022-09-27 DIAGNOSIS — Z8249 Family history of ischemic heart disease and other diseases of the circulatory system: Secondary | ICD-10-CM

## 2022-09-27 DIAGNOSIS — Z83438 Family history of other disorder of lipoprotein metabolism and other lipidemia: Secondary | ICD-10-CM

## 2022-09-27 DIAGNOSIS — Z6832 Body mass index (BMI) 32.0-32.9, adult: Secondary | ICD-10-CM

## 2022-09-27 DIAGNOSIS — K219 Gastro-esophageal reflux disease without esophagitis: Secondary | ICD-10-CM | POA: Diagnosis present

## 2022-09-27 DIAGNOSIS — I2510 Atherosclerotic heart disease of native coronary artery without angina pectoris: Secondary | ICD-10-CM | POA: Diagnosis present

## 2022-09-27 DIAGNOSIS — Z91040 Latex allergy status: Secondary | ICD-10-CM

## 2022-09-27 DIAGNOSIS — E785 Hyperlipidemia, unspecified: Secondary | ICD-10-CM | POA: Diagnosis present

## 2022-09-27 DIAGNOSIS — E860 Dehydration: Secondary | ICD-10-CM | POA: Diagnosis present

## 2022-09-27 DIAGNOSIS — C20 Malignant neoplasm of rectum: Secondary | ICD-10-CM

## 2022-09-27 DIAGNOSIS — I959 Hypotension, unspecified: Secondary | ICD-10-CM | POA: Diagnosis present

## 2022-09-27 DIAGNOSIS — E1169 Type 2 diabetes mellitus with other specified complication: Secondary | ICD-10-CM | POA: Diagnosis present

## 2022-09-27 DIAGNOSIS — I10 Essential (primary) hypertension: Secondary | ICD-10-CM | POA: Diagnosis present

## 2022-09-27 DIAGNOSIS — Z886 Allergy status to analgesic agent status: Secondary | ICD-10-CM

## 2022-09-27 DIAGNOSIS — Z888 Allergy status to other drugs, medicaments and biological substances status: Secondary | ICD-10-CM

## 2022-09-27 DIAGNOSIS — Z833 Family history of diabetes mellitus: Secondary | ICD-10-CM

## 2022-09-27 DIAGNOSIS — Z932 Ileostomy status: Secondary | ICD-10-CM

## 2022-09-27 DIAGNOSIS — Z7984 Long term (current) use of oral hypoglycemic drugs: Secondary | ICD-10-CM

## 2022-09-27 DIAGNOSIS — N179 Acute kidney failure, unspecified: Principal | ICD-10-CM | POA: Diagnosis present

## 2022-09-27 DIAGNOSIS — Z825 Family history of asthma and other chronic lower respiratory diseases: Secondary | ICD-10-CM

## 2022-09-27 DIAGNOSIS — Z79899 Other long term (current) drug therapy: Secondary | ICD-10-CM

## 2022-09-27 LAB — COMPREHENSIVE METABOLIC PANEL
ALT: 28 U/L (ref 0–44)
AST: 36 U/L (ref 15–41)
Albumin: 4.6 g/dL (ref 3.5–5.0)
Alkaline Phosphatase: 71 U/L (ref 38–126)
Anion gap: 14 (ref 5–15)
BUN: 38 mg/dL — ABNORMAL HIGH (ref 6–20)
CO2: 22 mmol/L (ref 22–32)
Calcium: 9.6 mg/dL (ref 8.9–10.3)
Chloride: 96 mmol/L — ABNORMAL LOW (ref 98–111)
Creatinine, Ser: 1.93 mg/dL — ABNORMAL HIGH (ref 0.61–1.24)
GFR, Estimated: 41 mL/min — ABNORMAL LOW (ref >60)
Glucose, Bld: 171 mg/dL — ABNORMAL HIGH (ref 70–99)
Potassium: 4.8 mmol/L (ref 3.5–5.1)
Sodium: 132 mmol/L — ABNORMAL LOW (ref 135–145)
Total Bilirubin: 0.9 mg/dL (ref 0.3–1.2)
Total Protein: 8.1 g/dL (ref 6.5–8.1)

## 2022-09-27 LAB — CBC WITH DIFFERENTIAL/PLATELET
Abs Immature Granulocytes: 0.06 10*3/uL (ref 0.00–0.07)
Basophils Absolute: 0.1 10*3/uL (ref 0.0–0.1)
Basophils Relative: 1 %
Eosinophils Absolute: 0.2 10*3/uL (ref 0.0–0.5)
Eosinophils Relative: 3 %
HCT: 43.1 % (ref 39.0–52.0)
Hemoglobin: 13.5 g/dL (ref 13.0–17.0)
Immature Granulocytes: 1 %
Lymphocytes Relative: 15 %
Lymphs Abs: 1.4 10*3/uL (ref 0.7–4.0)
MCH: 24.3 pg — ABNORMAL LOW (ref 26.0–34.0)
MCHC: 31.3 g/dL (ref 30.0–36.0)
MCV: 77.5 fL — ABNORMAL LOW (ref 80.0–100.0)
Monocytes Absolute: 0.7 10*3/uL (ref 0.1–1.0)
Monocytes Relative: 8 %
Neutro Abs: 6.8 10*3/uL (ref 1.7–7.7)
Neutrophils Relative %: 72 %
Platelets: 218 10*3/uL (ref 150–400)
RBC: 5.56 MIL/uL (ref 4.22–5.81)
RDW: 17.1 % — ABNORMAL HIGH (ref 11.5–15.5)
WBC: 9.3 10*3/uL (ref 4.0–10.5)
nRBC: 0 % (ref 0.0–0.2)

## 2022-09-27 LAB — RESP PANEL BY RT-PCR (RSV, FLU A&B, COVID)  RVPGX2
Influenza A by PCR: NEGATIVE
Influenza B by PCR: NEGATIVE
Resp Syncytial Virus by PCR: NEGATIVE
SARS Coronavirus 2 by RT PCR: NEGATIVE

## 2022-09-27 LAB — URINALYSIS, ROUTINE W REFLEX MICROSCOPIC
Bilirubin Urine: NEGATIVE
Glucose, UA: NEGATIVE mg/dL
Hgb urine dipstick: NEGATIVE
Ketones, ur: NEGATIVE mg/dL
Leukocytes,Ua: NEGATIVE
Nitrite: NEGATIVE
Protein, ur: NEGATIVE mg/dL
Specific Gravity, Urine: 1.021 (ref 1.005–1.030)
pH: 5 (ref 5.0–8.0)

## 2022-09-27 LAB — CBC
HCT: 42.1 % (ref 39.0–52.0)
Hemoglobin: 13.5 g/dL (ref 13.0–17.0)
MCH: 25 pg — ABNORMAL LOW (ref 26.0–34.0)
MCHC: 32.1 g/dL (ref 30.0–36.0)
MCV: 78.1 fL — ABNORMAL LOW (ref 80.0–100.0)
Platelets: 207 10*3/uL (ref 150–400)
RBC: 5.39 MIL/uL (ref 4.22–5.81)
RDW: 17.1 % — ABNORMAL HIGH (ref 11.5–15.5)
WBC: 9 10*3/uL (ref 4.0–10.5)
nRBC: 0 % (ref 0.0–0.2)

## 2022-09-27 LAB — MAGNESIUM: Magnesium: 2.3 mg/dL (ref 1.7–2.4)

## 2022-09-27 LAB — GLUCOSE, CAPILLARY
Glucose-Capillary: 234 mg/dL — ABNORMAL HIGH (ref 70–99)
Glucose-Capillary: 159 mg/dL — ABNORMAL HIGH (ref 70–99)

## 2022-09-27 LAB — CREATININE, SERUM
Creatinine, Ser: 1.67 mg/dL — ABNORMAL HIGH (ref 0.61–1.24)
GFR, Estimated: 49 mL/min — ABNORMAL LOW (ref >60)

## 2022-09-27 LAB — HIV ANTIBODY (ROUTINE TESTING W REFLEX): HIV Screen 4th Generation wRfx: NONREACTIVE

## 2022-09-27 LAB — LIPASE, BLOOD: Lipase: 196 U/L — ABNORMAL HIGH (ref 11–51)

## 2022-09-27 MED ORDER — TRAZODONE HCL 50 MG PO TABS: 25.0000 mg | ORAL_TABLET | Freq: Every evening | ORAL | Status: DC | PRN

## 2022-09-27 MED ORDER — LOPERAMIDE HCL 2 MG PO CAPS
2.0000 mg | ORAL_CAPSULE | Freq: Two times a day (BID) | ORAL | Status: DC
  Administered 2022-09-27 – 2022-09-30 (×6): 2 mg via ORAL
  Filled 2022-09-27 (×6): qty 1

## 2022-09-27 MED ORDER — ALUM & MAG HYDROXIDE-SIMETH 200-200-20 MG/5ML PO SUSP
30.0000 mL | Freq: Once | ORAL | Status: AC
  Administered 2022-09-27: 30 mL via ORAL
  Filled 2022-09-27: qty 30

## 2022-09-27 MED ORDER — ACETAMINOPHEN 650 MG RE SUPP: 650.0000 mg | Freq: Four times a day (QID) | RECTAL | Status: DC | PRN

## 2022-09-27 MED ORDER — TIZANIDINE HCL 4 MG PO TABS: 2.0000 mg | ORAL_TABLET | Freq: Three times a day (TID) | ORAL | Status: DC | PRN

## 2022-09-27 MED ORDER — ONDANSETRON HCL 4 MG/2ML IJ SOLN
4.0000 mg | Freq: Once | INTRAMUSCULAR | Status: AC
  Administered 2022-09-27: 4 mg via INTRAVENOUS
  Filled 2022-09-27: qty 2

## 2022-09-27 MED ORDER — ENOXAPARIN SODIUM 60 MG/0.6ML IJ SOSY
60.0000 mg | PREFILLED_SYRINGE | INTRAMUSCULAR | Status: DC
  Administered 2022-09-27 – 2022-09-29 (×3): 60 mg via SUBCUTANEOUS
  Filled 2022-09-27 (×3): qty 0.6

## 2022-09-27 MED ORDER — IOHEXOL 300 MG/ML  SOLN
100.0000 mL | Freq: Once | INTRAMUSCULAR | Status: AC | PRN
  Administered 2022-09-27: 80 mL via INTRAVENOUS

## 2022-09-27 MED ORDER — ONDANSETRON HCL 4 MG PO TABS: 4.0000 mg | ORAL_TABLET | Freq: Four times a day (QID) | ORAL | Status: DC | PRN

## 2022-09-27 MED ORDER — ACETAMINOPHEN 325 MG PO TABS: 650.0000 mg | ORAL_TABLET | Freq: Four times a day (QID) | ORAL | Status: DC | PRN

## 2022-09-27 MED ORDER — FERROUS SULFATE 325 (65 FE) MG PO TABS
325.0000 mg | ORAL_TABLET | Freq: Two times a day (BID) | ORAL | Status: DC
  Filled 2022-09-27 (×2): qty 1

## 2022-09-27 MED ORDER — ONDANSETRON HCL 4 MG/2ML IJ SOLN
4.0000 mg | Freq: Four times a day (QID) | INTRAMUSCULAR | Status: DC | PRN
  Administered 2022-09-27: 4 mg via INTRAVENOUS
  Filled 2022-09-27: qty 2

## 2022-09-27 MED ORDER — PANTOPRAZOLE SODIUM 40 MG IV SOLR
40.0000 mg | INTRAVENOUS | Status: DC
  Administered 2022-09-27 – 2022-09-28 (×2): 40 mg via INTRAVENOUS
  Filled 2022-09-27 (×2): qty 10

## 2022-09-27 MED ORDER — INSULIN ASPART 100 UNIT/ML IJ SOLN: 0.0000 [IU] | Freq: Every day | INTRAMUSCULAR | Status: DC

## 2022-09-27 MED ORDER — SODIUM CHLORIDE (PF) 0.9 % IJ SOLN
INTRAMUSCULAR | Status: AC
  Filled 2022-09-27: qty 50

## 2022-09-27 MED ORDER — CARVEDILOL 12.5 MG PO TABS
25.0000 mg | ORAL_TABLET | Freq: Two times a day (BID) | ORAL | Status: DC
  Administered 2022-09-27 – 2022-09-30 (×6): 25 mg via ORAL
  Filled 2022-09-27 (×6): qty 2

## 2022-09-27 MED ORDER — SODIUM CHLORIDE 0.9 % IV SOLN
INTRAVENOUS | Status: DC
  Administered 2022-09-30: 125 mL/h via INTRAVENOUS

## 2022-09-27 MED ORDER — INSULIN ASPART 100 UNIT/ML IJ SOLN
0.0000 [IU] | Freq: Three times a day (TID) | INTRAMUSCULAR | Status: DC
  Administered 2022-09-27: 5 [IU] via SUBCUTANEOUS
  Administered 2022-09-28 – 2022-09-29 (×4): 2 [IU] via SUBCUTANEOUS
  Administered 2022-09-29: 3 [IU] via SUBCUTANEOUS
  Administered 2022-09-30: 2 [IU] via SUBCUTANEOUS

## 2022-09-27 MED ORDER — OXYCODONE HCL 5 MG PO TABS: 5.0000 mg | ORAL_TABLET | ORAL | Status: DC | PRN

## 2022-09-27 MED ORDER — SODIUM CHLORIDE 0.9 % IV BOLUS
1000.0000 mL | Freq: Once | INTRAVENOUS | Status: AC
  Administered 2022-09-27: 1000 mL via INTRAVENOUS

## 2022-09-27 MED FILL — Dexamethasone Sodium Phosphate Inj 100 MG/10ML: INTRAMUSCULAR | Qty: 1 | Status: AC

## 2022-09-27 NOTE — ED Triage Notes (Signed)
C/o liquid consistency stool x5 in ileostomy with emesis x1, headache.   Also c/o abd cramping, feet, and legs.  Ileostomy place 08/23/22.  Hx rectal cancer and DM.

## 2022-09-27 NOTE — Progress Notes (Deleted)
[facility]   [phone] [phone]   Clinic Follow up Note   Patient Care Team: [name], MD as PCP - General (Family Medicine) Werner Lean, MD as PCP - Cardiology (Cardiology) Truitt Merle, MD as Consulting Physician (Oncology) Alla Feeling, NP as Nurse Practitioner (Oncology) Royston Bake, RN as Oncology Nurse Navigator (Oncology)  Date of Service:  09/27/2022  CHIEF COMPLAINT: f/u of rectal cancer   CURRENT THERAPY: S/P upfront surgical resection, pending adjuvant therapy    ASSESSMENT: *** Robert [name] is a 52 y.o. male with   No problem-specific Assessment & Plan notes found for this encounter.  ***   PLAN:    SUMMARY OF ONCOLOGIC HISTORY: Oncology History  Rectal cancer ([place])  05/09/2022 Procedure   Colonoscopy by Dr. Fuller Plan, impression - Five 5 to 7 mm polyps in the rectum, in the transverse colon and at the hepatic flexure, removed with a cold snare. Resected and retrieved. removed with a cold snare. Resected and retrieved. 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 Submucosal injection tattoo. - Internal hemorrhoids. - The examination was otherwise normal on direct and retroflexion views.   05/09/2022 Initial Biopsy   Diagnosis 1. Surgical [P], colon, transverse x1 and hepatic flexure x 3 (2 pieces only, 2 stuck together taken off together), polyp (4) - TUBULAR ADENOMA(S) - NEGATIVE FOR HIGH-GRADE DYSPLASIA OR MALIGNANCY 2. Surgical [P], colon, rectal mass - INVASIVE MODERATELY DIFFERENTIATED ADENOCARCINOMA. SEE NOTE 3. Surgical [P], colon, rectum, polyp (1) - HYPERPLASTIC POLYP   05/09/2022 Tumor Marker   CEA < 2.0   05/17/2022 Imaging   IMPRESSION: 1. Known primary malignancy is not well visualized. Correlate with recent colonoscopy. 2. No evidence of metastatic disease in the chest, abdomen or pelvis. 3. Mildly enlarged bilateral inguinal lymph nodes, likely reactive. 4. 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 Recommend annual imaging followup by CTA or MRA. This recommendation follows 2010 ACCF/AHA/AATS/ACR/ASA/SCA/SCAI/SIR/STS/SVM Guidelines for the Diagnosis and Management of Patients with Thoracic Aortic Disease. Circulation. 2010; [id number]. 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 Severe coronary artery calcifications of the LAD and circumflex. 6. Aortic Atherosclerosis (ICD10-I70.0).   05/23/2022 Initial Diagnosis   Rectal cancer ([place])   08/23/2022 Cancer Staging   Staging form: Colon and Rectum, AJCC 8th Edition - Pathologic stage from 08/23/2022: Stage IIA (pT3, pN0, cM0) - Signed by Alla Feeling, NP on 09/10/2022 Stage prefix: Initial diagnosis Total positive nodes: 0   08/23/2022 Surgery   PROCEDURE:  Robotic assisted low anterior resection with diverting loop ileostomy SURGEON: Sharon Mt. Dema Severin, MD ASSISTANT: Leighton Ruff, MD   [id number] Pathology Results   FINAL MICROSCOPIC DIAGNOSIS:  A. COLON, RECTOSIGMOID, RESECTION:  - Invasive moderately differentiated adenocarcinoma, 2.5 cm, involving rectum  - Carcinoma invades focally into the perirectal adipose tissue  - Resection margins are negative for carcinoma  - Negative for lymphovascular invasion  - Fourteen lymph nodes, negative for carcinoma (0/14)  - See oncology table  B. DISTAL MARGIN DONUT:  - Rectal donut, negative for carcinoma   Regional Lymph Nodes:       Number of Lymph Nodes with Tumor: 0       Number of Lymph Nodes Examined: 14  Pathologic Stage Classification (pTNM, AJCC 8th Edition): pT3, pN0  Mismatch Repair Protein (IHC)  SUMMARY INTERPRETATION: NORMAL  MSI-Stable   09/27/2022 -  Chemotherapy   Patient is on Treatment Plan : RECTAL Xelox ([place]apeox) (130/850) q21d x 6 cycles  INTERVAL HISTORY: *** Robert [name] is here for a follow up of rectal cancer  He was last seen by NP Lacie on 09/13/2022 He presents to the clinic    All other systems  were reviewed with the patient and are negative.  MEDICAL HISTORY:  Past Medical History:  Diagnosis Date   Arthralgia 01/11/2013   Diffuse, b/l Hips R>L Knees, ankles, low back   Arthritis    Back pain 12/08/2015   Cancer ([place])    Coronary artery disease    Diabetes mellitus type 2 in obese ([place]) 11/09/2012   Dyslipidemia 11/09/2012   Esophageal reflux 12/08/2015   Feeling grief 06/09/2020   History of migraine headaches    Hyperglycemia 11/09/2012   Hypertension     SURGICAL HISTORY: Past Surgical History:  Procedure Laterality Date   DIVERTING ILEOSTOMY N/A 08/23/2022   Procedure: DIVERTING LOOP ILEOSTOMY;  Surgeon: Ileana Roup, MD;  Location: [facility];  Service: General;  Laterality: N/A;   EUS N/A 06/21/2022   Procedure: LOWER ENDOSCOPIC ULTRASOUND (EUS);  Surgeon: Irving Copas., MD;  Location: [facility];  Service: Gastroenterology;  Laterality: N/A;   FLEXIBLE SIGMOIDOSCOPY N/A 06/21/2022   Procedure: FLEXIBLE SIGMOIDOSCOPY;  Surgeon: Rush Landmark Telford Nab., MD;  Location: [facility];  Service: Gastroenterology;  Laterality: N/A;   FLEXIBLE SIGMOIDOSCOPY N/A 08/23/2022   Procedure: FLEXIBLE SIGMOIDOSCOPY;  Surgeon: Ileana Roup, MD;  Location: [facility];  Service: General;  Laterality: N/A;   PORTACATH PLACEMENT N/A 09/21/2022   Procedure: INSERTION PORT-A-CATH WITH ULTRASOUND GUIDANCE;  Surgeon: Ileana Roup, MD;  Location: [facility];  Service: General;  Laterality: N/A;   XI ROBOTIC ASSISTED LOWER ANTERIOR RESECTION N/A 08/23/2022   Procedure: XI ROBOTIC ASSISTED LOWER ANTERIOR RESECTION with Introperative assessment of profusion and TAP Block;  Surgeon: Ileana Roup, MD;  Location: [facility];  Service: General;  Laterality: N/A;    I have reviewed the social history and family history with the patient and they are unchanged from previous note.  ALLERGIES:  is allergic to mucinex [guaifenesin er], nsaids, and latex.  MEDICATIONS:  No  current facility-administered medications for this visit.   No current outpatient medications on file.   Facility-Administered Medications Ordered in Other Visits  Medication Dose Route Frequency Provider Last Rate Last Admin   0.9 %  sodium chloride infusion   Intravenous Continuous Hollice Gong, Mir M, MD 75 mL/hr at 09/27/22 1525 New Bag at 09/27/22 1525   acetaminophen (TYLENOL) tablet 650 mg  650 mg Oral Q6H PRN Hollice Gong, Mir M, MD       Or   acetaminophen (TYLENOL) suppository 650 mg  650 mg Rectal Q6H PRN Hollice Gong, Mir M, MD       carvedilol (COREG) tablet 25 mg  25 mg Oral BID Hollice Gong, Mir M, MD       enoxaparin (LOVENOX) injection 60 mg  60 mg Subcutaneous Q24H Hollice Gong, Mir M, MD       ferrous sulfate tablet 325 mg  325 mg Oral BID WC Hollice Gong, Mir M, MD       insulin aspart (novoLOG) injection 0-15 Units  0-15 Units Subcutaneous TID WC Ikramullah, Mir M, MD       insulin aspart (novoLOG) injection 0-5 Units  0-5 Units Subcutaneous QHS Hollice Gong, Mir M, MD       loperamide (IMODIUM) capsule 2 mg  2 mg Oral BID Hollice Gong, Mir M, MD       ondansetron [facility]) tablet 4 mg  4 mg Oral Q6H  PRN Hollice Gong, Mir M, MD       Or   ondansetron [facility]) injection 4 mg  4 mg Intravenous Q6H PRN Hollice Gong, Mir M, MD       oxyCODONE (Oxy IR/ROXICODONE) immediate release tablet 5 mg  5 mg Oral Q4H PRN Hollice Gong, Mir M, MD       pantoprazole (PROTONIX) injection 40 mg  40 mg Intravenous Q24H Hollice Gong, Mir M, MD       tiZANidine (ZANAFLEX) tablet 2-4 mg  2-4 mg Oral TID PRN Hollice Gong, Mir M, MD       traZODone (DESYREL) tablet 25 mg  25 mg Oral QHS PRN Hollice Gong, Mir M, MD        PHYSICAL EXAMINATION: ECOG PERFORMANCE STATUS: {CHL ONC ECOG [id number]  There were no vitals filed for this visit. Wt Readings from Last 3 Encounters:  09/21/22 268 lb (121.6 kg)  09/18/22 266 lb 6.4 oz (120.8 kg)  09/17/22 265 lb 9.6 oz (120.5 kg)    {Only keep what was examined. If exam not  performed, can use .CEXAM } GENERAL:alert, no distress and comfortable SKIN: skin color, texture, turgor are normal, no rashes or significant lesions EYES: normal, Conjunctiva are pink and non-injected, sclera clear {OROPHARYNX:no exudate, no erythema and lips, buccal mucosa, and tongue normal}  NECK: supple, thyroid normal size, non-tender, without nodularity LYMPH:  no palpable lymphadenopathy in the cervical, axillary {or inguinal} LUNGS: clear to auscultation and percussion with normal breathing effort HEART: regular rate & rhythm and no murmurs and no lower extremity edema ABDOMEN:abdomen soft, non-tender and normal bowel sounds Musculoskeletal:no cyanosis of digits and no clubbing  NEURO: alert & oriented x 3 with fluent speech, no focal motor/sensory deficits  LABORATORY DATA:  I have reviewed the data as listed    Latest Ref Rng & Units 09/27/2022    2:05 PM 09/27/2022    8:07 AM 09/13/2022   10:57 AM  CBC  WBC 4.0 - 10.5 K/uL 9.0  9.3  6.6   Hemoglobin 13.0 - 17.0 g/dL 13.5  13.5  12.5   Hematocrit 39.0 - 52.0 % 42.1  43.1  39.4   Platelets 150 - 400 K/uL 207  218  322         Latest Ref Rng & Units 09/27/2022    2:05 PM 09/27/2022    8:07 AM 09/13/2022   10:57 AM  CMP  Glucose 70 - 99 mg/dL  171  155   BUN 6 - 20 mg/dL  38  28   Creatinine 0.61 - 1.24 mg/dL 1.67  1.93  1.45   Sodium 135 - 145 mmol/L  132  137   Potassium 3.5 - 5.1 mmol/L  4.8  4.4   Chloride 98 - 111 mmol/L  96  106   CO2 22 - 32 mmol/L  22  24   Calcium 8.9 - 10.3 mg/dL  9.6  10.0   Total Protein 6.5 - 8.1 g/dL  8.1  7.7   Total Bilirubin 0.3 - 1.2 mg/dL  0.9  0.4   Alkaline Phos 38 - 126 U/L  71  72   AST 15 - 41 U/L  36  20   ALT 0 - 44 U/L  28  22       RADIOGRAPHIC STUDIES: I have personally reviewed the radiological images as listed and agreed with the findings in the report. CT ABDOMEN PELVIS W CONTRAST  Result Date: 09/27/2022 CLINICAL DATA:  LUQ pain.  Rectal cancer. EXAM: CT  ABDOMEN  AND PELVIS WITH CONTRAST TECHNIQUE: Multidetector CT imaging of the abdomen and pelvis was performed using the standard protocol following bolus administration of intravenous contrast. RADIATION DOSE REDUCTION: This exam was performed according to the departmental dose-optimization program which includes automated exposure control, adjustment of the mA and/or kV according to patient size and/or use of iterative reconstruction technique. CONTRAST:  80 mL OMNIPAQUE IOHEXOL 300 MG/ML  SOLN COMPARISON:  05/17/2022 FINDINGS: Lower chest: Dependent subsegmental atelectasis or scarring. No pleural or pericardial effusion. Hepatobiliary: No focal liver abnormality is seen. No gallstones, gallbladder wall thickening, or biliary dilatation. Pancreas: Unremarkable. No pancreatic ductal dilatation or surrounding inflammatory changes. Spleen: Normal in size without focal abnormality. Adrenals/Urinary Tract: Adrenal glands are unremarkable. Kidneys are normal, without renal calculi, focal lesion, or hydronephrosis. Bladder is unremarkable. Stomach/Bowel: Right lower quadrant ileostomy. No bowel dilatation to suggest obstruction. Rectosigmoid bowel anastomosis. Appendix is unremarkable. No bowel wall thickening or pericolonic inflammatory changes to suggest colitis. No evidence of abscess. Vascular/Lymphatic: Aortic atherosclerosis. No enlarged abdominal or pelvic lymph nodes. Reproductive: Prostate is unremarkable. Other: No abdominal wall hernia or abnormality. No abdominopelvic ascites. Musculoskeletal: No acute or significant osseous findings. IMPRESSION: 1. Status post ileostomy and rectosigmoid resection. No bowel obstruction or inflammatory changes. 2. No evidence of abdominal or pelvic metastatic disease. 3. No acute abdominal or pelvic pathology identified. Electronically Signed   By: Sammie Bench M.D.   On: 09/27/2022 10:49      No orders of the defined types were placed in this encounter.  All questions were  answered. The patient knows to call the clinic with any problems, questions or concerns. No barriers to learning was detected. The total time spent in the appointment was {CHL ONC TIME VISIT - [id number].     Baldemar Friday, CMA 09/27/2022   I, Audry Riles, CMA, am acting as scribe for Truitt Merle, MD.   {Add scribe attestation statement}

## 2022-09-27 NOTE — ED Provider Notes (Signed)
[place] EMERGENCY DEPARTMENT AT [facility] Provider Note   CSN: [id number] Arrival date & time: 09/27/22  0710     History  Chief Complaint  Patient presents with   Emesis    Robert [name] is a 52 y.o. male.  The history is provided by the patient, the spouse and medical records. No language interpreter was used.  Emesis    52 year old male with significant history of rectal cancer status post ileostomy, hypertension, diabetes, esophageal reflux, CAD who presents with complaint of abdominal cramping.  Patient report in October of last year he went for a regular checkup and was found to have a rectal mass.  He subsequently was diagnosed with rectal cancer.  He recently had a ileostomy that was placed approximately a month ago.  For the past 4 to 5 days he noticed high output of liquid stool in his ostomy bag requiring him to empty it multiple times overnight.  He also complaining of having burning sensation to his epigastric region after having to his stop his Nexium in preparation for chemo treatment starting next week.  He felt the burning sensation is related to his acid reflux.  He also complaining of aches and cramps throughout his extremities for the past several days.  He tries to stay hydrated with electrolytes and fluid but still putting out quite a bit of fluid through his ostomy bag.  He feels weak and tired.  He does not endorse any runny nose sneezing or coughing or sore throat.  No change in his urinary habits.  His oncologist is Dr. Burr Medico.    Home Medications Prior to Admission medications   Medication Sig Start Date End Date Taking? Authorizing Provider  amLODipine (NORVASC) 5 MG tablet TAKE 1 TABLET (5 MG TOTAL) BY MOUTH DAILY. 08/14/22   [name], MD  capecitabine (XELODA) 500 MG tablet TAKE 4 TABLETS BY MOUTH 2 TIMES A DAY AFTER A MEAL ON DAYS 1 TO 14 OF A 21 DAY CYCLE. 09/25/22   Truitt Merle, MD  carvedilol (COREG) 25 MG tablet Take 1 tablet (25 mg total)  by mouth 2 (two) times daily. 06/19/22   Chandrasekhar, Lyda Kalata A, MD  esomeprazole (NEXIUM) 20 MG capsule Take 20 mg by mouth daily.    [provider]  famotidine (PEPCID) 20 MG tablet Take 20 mg by mouth 2 (two) times daily.    [provider]  glucose blood (COOL BLOOD GLUCOSE TEST STRIPS) test strip Check blood sugar once daily 09/18/22   [name], MD  Iron, Ferrous Sulfate, 325 (65 Fe) MG TABS Take 325 mg by mouth 2 (two) times daily with a meal. 05/14/22   Ladene Artist, MD  lidocaine-prilocaine (EMLA) cream Apply to affected area once 09/13/22   Truitt Merle, MD  loperamide (IMODIUM) 2 MG capsule Take 1 capsule (2 mg total) by mouth 2 (two) times daily. 08/26/22   Greer Pickerel, MD  losartan (COZAAR) 100 MG tablet TAKE 1 TABLET BY MOUTH EVERY DAY 08/14/22   [name], MD  metFORMIN (GLUCOPHAGE) 500 MG tablet Take 1 tablet (500 mg total) by mouth daily with breakfast. 09/18/22   [name], MD  ondansetron (ZOFRAN) 8 MG tablet Take 1 tablet (8 mg total) by mouth every 8 (eight) hours as needed for nausea or vomiting. Start on the third day after chemotherapy. 09/13/22   Truitt Merle, MD  prochlorperazine (COMPAZINE) 10 MG tablet Take 1 tablet (10 mg total) by mouth every 6 (  six) hours as needed for nausea or vomiting. 09/13/22   Truitt Merle, MD  sitaGLIPtin (JANUVIA) 100 MG tablet Take 1 tablet (100 mg total) by mouth daily. 09/18/22   [name], MD  tiZANidine (ZANAFLEX) 2 MG tablet Take 0.5-2 tablets (1-4 mg total) by mouth 2 (two) times daily as needed for muscle spasms. Patient taking differently: Take 2-4 mg by mouth 3 (three) times daily as needed for muscle spasms. 04/13/21   [name], MD      Allergies    Mucinex [guaifenesin er], Nsaids, and Latex    Review of Systems   Review of Systems  Gastrointestinal:  Positive for vomiting.  All other systems reviewed and are negative.   Physical Exam Updated Vital Signs BP (!) 128/91 (BP Location: Left Arm)    Pulse 75   Temp 97.7 F (36.5 C) (Oral)   Resp 16   SpO2 99%  Physical Exam Vitals and nursing note reviewed.  Constitutional:      General: He is not in acute distress.    Appearance: He is well-developed.  HENT:     Head: Atraumatic.  Eyes:     Conjunctiva/sclera: Conjunctivae normal.  Cardiovascular:     Rate and Rhythm: Normal rate and regular rhythm.     Pulses: Normal pulses.     Heart sounds: Normal heart sounds.  Pulmonary:     Effort: Pulmonary effort is normal.     Breath sounds: Normal breath sounds. No wheezing, rhonchi or rales.  Abdominal:     Palpations: Abdomen is soft.     Tenderness: There is abdominal tenderness (Epigastric tenderness no guarding rebound tenderness.).     Comments: Ileostomy to right side abdomen with normal-appearing stoma and minimal output noted in bag.  Musculoskeletal:     Cervical back: Neck supple.  Skin:    Findings: No rash.  Neurological:     Mental Status: He is alert.     ED Results / Procedures / Treatments   Labs (all labs ordered are listed, but only abnormal results are displayed) Labs Reviewed  CBC WITH DIFFERENTIAL/PLATELET - Abnormal; Notable for the following components:      Result Value   MCV 77.5 (*)    MCH 24.3 (*)    RDW 17.1 (*)    All other components within normal limits  COMPREHENSIVE METABOLIC PANEL - Abnormal; Notable for the following components:   Sodium 132 (*)    Chloride 96 (*)    Glucose, Bld 171 (*)    BUN 38 (*)    Creatinine, Ser 1.93 (*)    GFR, Estimated 41 (*)    All other components within normal limits  LIPASE, BLOOD - Abnormal; Notable for the following components:   Lipase 196 (*)    All other components within normal limits  URINALYSIS, ROUTINE W REFLEX MICROSCOPIC - Abnormal; Notable for the following components:   APPearance HAZY (*)    All other components within normal limits  RESP PANEL BY RT-PCR (RSV, FLU A&B, COVID)  RVPGX2  MAGNESIUM    EKG None ED ECG REPORT    Date: 09/27/2022  Rate: 66  Rhythm: normal sinus rhythm  QRS Axis: left  Intervals: normal  ST/T Wave abnormalities: nonspecific ST changes  Conduction Disutrbances: Incomplete left bundle branch block  Narrative Interpretation:   Old EKG Reviewed: unchanged  I have personally reviewed the EKG tracing and agree with the computerized printout as noted.   Radiology CT ABDOMEN PELVIS W CONTRAST  Result Date: 09/27/2022 CLINICAL DATA:  LUQ pain.  Rectal cancer. EXAM: CT ABDOMEN AND PELVIS WITH CONTRAST TECHNIQUE: Multidetector CT imaging of the abdomen and pelvis was performed using the standard protocol following bolus administration of intravenous contrast. RADIATION DOSE REDUCTION: This exam was performed according to the departmental dose-optimization program which includes automated exposure control, adjustment of the mA and/or kV according to patient size and/or use of iterative reconstruction technique. CONTRAST:  80 mL OMNIPAQUE IOHEXOL 300 MG/ML  SOLN COMPARISON:  05/17/2022 FINDINGS: Lower chest: Dependent subsegmental atelectasis or scarring. No pleural or pericardial effusion. Hepatobiliary: No focal liver abnormality is seen. No gallstones, gallbladder wall thickening, or biliary dilatation. Pancreas: Unremarkable. No pancreatic ductal dilatation or surrounding inflammatory changes. Spleen: Normal in size without focal abnormality. Adrenals/Urinary Tract: Adrenal glands are unremarkable. Kidneys are normal, without renal calculi, focal lesion, or hydronephrosis. Bladder is unremarkable. Stomach/Bowel: Right lower quadrant ileostomy. No bowel dilatation to suggest obstruction. Rectosigmoid bowel anastomosis. Appendix is unremarkable. No bowel wall thickening or pericolonic inflammatory changes to suggest colitis. No evidence of abscess. Vascular/Lymphatic: Aortic atherosclerosis. No enlarged abdominal or pelvic lymph nodes. Reproductive: Prostate is unremarkable. Other: No abdominal wall  hernia or abnormality. No abdominopelvic ascites. Musculoskeletal: No acute or significant osseous findings. IMPRESSION: 1. Status post ileostomy and rectosigmoid resection. No bowel obstruction or inflammatory changes. 2. No evidence of abdominal or pelvic metastatic disease. 3. No acute abdominal or pelvic pathology identified. Electronically Signed   By: Sammie Bench M.D.   On: 09/27/2022 10:49    Procedures Procedures    Medications Ordered in ED Medications  sodium chloride 0.9 % bolus 1,000 mL (0 mLs Intravenous Stopped 09/27/22 0920)  alum & mag hydroxide-simeth (MAALOX/MYLANTA) 200-200-20 MG/5ML suspension 30 mL (30 mLs Oral Given 09/27/22 0828)  ondansetron (ZOFRAN) injection 4 mg (4 mg Intravenous Given 09/27/22 0828)  sodium chloride 0.9 % bolus 1,000 mL (1,000 mLs Intravenous New Bag/Given 09/27/22 0948)  iohexol (OMNIPAQUE) 300 MG/ML solution 100 mL (80 mLs Intravenous Contrast Given 09/27/22 1004)  sodium chloride (PF) 0.9 % injection (  Given by Other 09/27/22 1015)    ED Course/ Medical Decision Making/ A&P                             Medical Decision Making Amount and/or Complexity of Data Reviewed Labs: ordered. Radiology: ordered. ECG/medicine tests: ordered.  Risk OTC drugs. Prescription drug management. Decision regarding hospitalization.   BP (!) 128/91 (BP Location: Left Arm)   Pulse 75   Temp 97.7 F (36.5 C) (Oral)   Resp 16   SpO2 99%   76:35 AM 52 year old male with significant history of rectal cancer status post ileostomy, hypertension, diabetes, esophageal reflux, CAD who presents with complaint of abdominal cramping.  Patient report in October of last year he went for a regular checkup and was found to have a rectal mass.  He subsequently was diagnosed with rectal cancer.  He recently had a ileostomy that was placed approximately a month ago.  For the past 4 to 5 days he noticed high output of liquid stool in his ostomy bag requiring him to empty it  multiple times overnight.  He also complaining of having burning sensation to his epigastric region after having to his stop his Nexium in preparation for chemo treatment starting next week.  He felt the burning sensation is related to his acid reflux.  He also complaining of aches and cramps throughout his extremities for  the past several days.  He tries to stay hydrated with electrolytes and fluid but still putting out quite a bit of fluid through his ostomy bag.  He feels weak and tired.  He does not endorse any runny nose sneezing or coughing or sore throat.  No change in his urinary habits.  His oncologist is Dr. Burr Medico.    On exam this is a well-appearing male laying bed appears to be in no acute discomfort.  Heart with normal rate and rhythm, lungs are clear to auscultation bilaterally abdomen is soft with some tenderness to epigastric but no guarding rebound tenderness.  He has a ileostomy bag noted to his right side abdomen with normal-appearing stoma and no signs of surrounding skin infection.  Normal skin turgor.  Vitals are reviewed and overall reassuring he is not hypotensive no fever and no hypoxia.  Normal heart rate.  He does not have any URI symptoms.  Given his complaints of weakness, I ostomy output, and crampings, will check electrolyte panels, labs ordered, may consider abdominal pelvis CT scan for further assessment.  Will give IV fluid.  -Labs ordered, independently viewed and interpreted by me.  Labs remarkable for Cr. 1.93 suggestive for AKI.  GFR 41.  Elevated lipase of 196, since pt does have upper abd pain, will obtain abd/pelvis CT to r/o pancreatitis -The patient was maintained on a cardiac monitor.  I personally viewed and interpreted the cardiac monitored which showed an underlying rhythm of: NSR -Imaging independently viewed and interpreted by me and I agree with radiologist's interpretation.  Result remarkable for abd/pelvis CT showing no acute changes -This patient presents  to the ED for concern of diarrhea, this involves an extensive number of treatment options, and is a complaint that carries with it a high risk of complications and morbidity.  The differential diagnosis includes high output ileostomy, viral illness, medication side effect, malabsorption, AKI  -Co morbidities that complicate the patient evaluation includes rectal cancer -Treatment includes IVF, antiemetic -Reevaluation of the patient after these medicines showed that the patient improved -PCP office notes or outside notes reviewed -Discussion with specialist [facility] DR. Dalbert Batman who agrees with admission -Escalation to admission/observation considered: patient is comfortable with hospital admission.   12:36 PM Fortunately CT scan of the pelvis did not show any concerning finding.  Given patient new AKI, persistent high output ileostomy, and signs of dehydration despite oral hydration, he will need to be admitted for further care.         Final Clinical Impression(s) / ED Diagnoses Final diagnoses:  AKI (acute kidney injury) [facility])    Rx / [place] Orders ED Discharge Orders     None         Domenic Moras, PA-C 09/27/22 1238    Pattricia Boss, MD 09/29/22 1524

## 2022-09-27 NOTE — H&P (Signed)
History and Physical  Robert [name] [id number] DOB: 02-18-1971 DOA: 09/27/2022   PCP: Robert Lukes, MD   Patient coming from: Home   Chief Complaint: Copious watery ostomy output  HPI: Robert [name] is a pleasant but unfortunate 52 y.o. male with history of non-insulin-dependent type 2 diabetes, hypertension, and recent diagnosis of rectal cancer in October 2023 who had ostomy placed about 1 month ago, and is being admitted to the hospital with acute renal failure due to dehydration from high ostomy output.  Patient states that he just had a right chest wall port placed on 2/9, and on that same day in preparation for chemotherapy which is scheduled to start this week, his oral Nexium was discontinued and he was placed on Pepcid.  Since the switch to Pepcid, he has had significant daily watery diarrhea, and worsening in his GERD symptoms.  This switch was made due to potential interaction with his planned chemotherapy.  While the patient has not had any fevers, chills, nausea, vomiting, abdominal pain, etc. for the last few days, pretty much every evening he has copious large amounts of watery light brown stool output.  He says that in the last few days, he often will drink water, and feels like the water just immediately comes out of his ostomy.  Is been doing his best to drink plenty of fluids including Gatorade and water.  Due to continuation of the high output, he came to the emergency department for evaluation today.  ED Course: Evaluation in the emergency department revealed normal vital signs.  Lab work is significant only for sodium 132, BUN 38 and creatinine 1.93.  His baseline creatinine is normal at 0.97.  He had CT of the abdomen pelvis with IV contrast, which did not show any acute abnormality, no evidence of metastatic disease.  Review of Systems: Please see HPI for pertinent positives and negatives. A complete 10 system review of systems are otherwise negative.  Past Medical  History:  Diagnosis Date   Arthralgia 01/11/2013   Diffuse, b/l Hips R>L Knees, ankles, low back   Arthritis    Back pain 12/08/2015   Cancer ([place])    Coronary artery disease    Diabetes mellitus type 2 in obese ([place]) 11/09/2012   Dyslipidemia 11/09/2012   Esophageal reflux 12/08/2015   Feeling grief 06/09/2020   History of migraine headaches    Hyperglycemia 11/09/2012   Hypertension    Past Surgical History:  Procedure Laterality Date   DIVERTING ILEOSTOMY N/A 08/23/2022   Procedure: DIVERTING LOOP ILEOSTOMY;  Surgeon: Ileana Roup, MD;  Location: [facility];  Service: General;  Laterality: N/A;   EUS N/A 06/21/2022   Procedure: LOWER ENDOSCOPIC ULTRASOUND (EUS);  Surgeon: Irving Copas., MD;  Location: [facility];  Service: Gastroenterology;  Laterality: N/A;   FLEXIBLE SIGMOIDOSCOPY N/A 06/21/2022   Procedure: FLEXIBLE SIGMOIDOSCOPY;  Surgeon: Rush Landmark Telford Nab., MD;  Location: [facility];  Service: Gastroenterology;  Laterality: N/A;   FLEXIBLE SIGMOIDOSCOPY N/A 08/23/2022   Procedure: FLEXIBLE SIGMOIDOSCOPY;  Surgeon: Ileana Roup, MD;  Location: [facility];  Service: General;  Laterality: N/A;   PORTACATH PLACEMENT N/A 09/21/2022   Procedure: INSERTION PORT-A-CATH WITH ULTRASOUND GUIDANCE;  Surgeon: Ileana Roup, MD;  Location: [facility];  Service: General;  Laterality: N/A;   XI ROBOTIC ASSISTED LOWER ANTERIOR RESECTION N/A 08/23/2022   Procedure: XI ROBOTIC ASSISTED LOWER ANTERIOR RESECTION with Introperative assessment of profusion and TAP Block;  Surgeon: Ileana Roup, MD;  Location: [facility];  Service: General;  Laterality: N/A;    Social History:  reports that he quit smoking about 27 years ago. His smoking use included cigarettes. He has a 15.00 pack-year smoking history. He has never used smokeless tobacco. He reports current alcohol use of about 6.0 - 10.0 standard drinks of alcohol per week. He reports current drug use. Drug:  Marijuana.   Allergies  Allergen Reactions   Mucinex [Guaifenesin Er] Shortness Of Breath, Itching, Swelling, Dermatitis and Rash    Swelling of hands and feet   Nsaids Shortness Of Breath, Itching, Swelling and Rash    Aleve and Advil    Latex Rash and Other (See Comments)    Cartilage will harden     Family History  Problem Relation Age of Onset   Arthritis Mother    Hypertension Mother    Diabetes Mother    Cancer Mother    Arthritis Father    Hypertension Father    Diabetes Father    Sleep apnea Father    Obesity Father    Arthritis Maternal Grandmother    Diabetes Maternal Grandmother    Stroke Maternal Grandmother    Arthritis Paternal Grandmother    Diabetes Paternal Grandmother    Cancer Paternal Grandmother        lung, smoker   Diabetes Paternal Grandfather    Heart disease Paternal Grandfather 74       MI   Hyperlipidemia Brother    Hypertension Brother    Diabetes Brother    COPD Maternal Grandfather    Kidney disease Neg Hx      Prior to Admission medications   Medication Sig Start Date End Date Taking? Authorizing Provider  amLODipine (NORVASC) 5 MG tablet TAKE 1 TABLET (5 MG TOTAL) BY MOUTH DAILY. 08/14/22   Robert Lukes, MD  capecitabine (XELODA) 500 MG tablet TAKE 4 TABLETS BY MOUTH 2 TIMES A DAY AFTER A MEAL ON DAYS 1 TO 14 OF A 21 DAY CYCLE. 09/25/22   Truitt Merle, MD  carvedilol (COREG) 25 MG tablet Take 1 tablet (25 mg total) by mouth 2 (two) times daily. 06/19/22   Chandrasekhar, Lyda Kalata A, MD  esomeprazole (NEXIUM) 20 MG capsule Take 20 mg by mouth daily.    [provider]  famotidine (PEPCID) 20 MG tablet Take 20 mg by mouth 2 (two) times daily.    [provider]  glucose blood (COOL BLOOD GLUCOSE TEST STRIPS) test strip Check blood sugar once daily 09/18/22   Robert Lukes, MD  Iron, Ferrous Sulfate, 325 (65 Fe) MG TABS Take 325 mg by mouth 2 (two) times daily with a meal. 05/14/22   Ladene Artist, MD  lidocaine-prilocaine  (EMLA) cream Apply to affected area once 09/13/22   Truitt Merle, MD  loperamide (IMODIUM) 2 MG capsule Take 1 capsule (2 mg total) by mouth 2 (two) times daily. 08/26/22   Greer Pickerel, MD  losartan (COZAAR) 100 MG tablet TAKE 1 TABLET BY MOUTH EVERY DAY 08/14/22   Robert Lukes, MD  metFORMIN (GLUCOPHAGE) 500 MG tablet Take 1 tablet (500 mg total) by mouth daily with breakfast. 09/18/22   Robert Lukes, MD  ondansetron (ZOFRAN) 8 MG tablet Take 1 tablet (8 mg total) by mouth every 8 (eight) hours as needed for nausea or vomiting. Start on the third day after chemotherapy. 09/13/22   Truitt Merle, MD  prochlorperazine (COMPAZINE) 10 MG tablet Take 1 tablet (10 mg total) by mouth every 6 (six) hours as needed  for nausea or vomiting. 09/13/22   Truitt Merle, MD  sitaGLIPtin (JANUVIA) 100 MG tablet Take 1 tablet (100 mg total) by mouth daily. 09/18/22   Robert Lukes, MD  tiZANidine (ZANAFLEX) 2 MG tablet Take 0.5-2 tablets (1-4 mg total) by mouth 2 (two) times daily as needed for muscle spasms. Patient taking differently: Take 2-4 mg by mouth 3 (three) times daily as needed for muscle spasms. 04/13/21   Robert Lukes, MD   Physical Exam: BP 132/82   Pulse 72   Temp 98.7 F (37.1 C) (Oral)   Resp 18   SpO2 97%   General:  Alert, oriented, calm, in no acute distress resting comfortably, nontoxic Eyes: EOMI, clear conjuctivae, white sclerea Neck: supple, no masses, trachea mildline  Cardiovascular: RRR, no murmurs or rubs, no peripheral edema  Respiratory: clear to auscultation bilaterally, no wheezes, no crackles  Abdomen: soft, nontender, nondistended, ostomy in place looks healthy, with light brown liquid stool; hyperactive bowel sounds are heard Skin: dry, no rashes  Musculoskeletal: no joint effusions, normal range of motion  Psychiatric: appropriate affect, normal speech  Neurologic: extraocular muscles intact, clear speech, moving all extremities with intact sensorium          Labs on Admission:   Basic Metabolic Panel: Recent Labs  Lab 09/27/22 0807  NA 132*  K 4.8  CL 96*  CO2 22  GLUCOSE 171*  BUN 38*  CREATININE 1.93*  CALCIUM 9.6  MG 2.3   Liver Function Tests: Recent Labs  Lab 09/27/22 0807  AST 36  ALT 28  ALKPHOS 71  BILITOT 0.9  PROT 8.1  ALBUMIN 4.6   Recent Labs  Lab 09/27/22 0807  LIPASE 196*   No results for input(s): "AMMONIA" in the last 168 hours. CBC: Recent Labs  Lab 09/27/22 0807  WBC 9.3  NEUTROABS 6.8  HGB 13.5  HCT 43.1  MCV 77.5*  PLT 218   Cardiac Enzymes: No results for input(s): "CKTOTAL", "CKMB", "CKMBINDEX", "TROPONINI" in the last 168 hours.  BNP (last 3 results) No results for input(s): "BNP" in the last 8760 hours.  ProBNP (last 3 results) No results for input(s): "PROBNP" in the last 8760 hours.  CBG: Recent Labs  Lab 09/21/22 1049 09/21/22 1329  GLUCAP 124* 126*    Radiological Exams on Admission: CT ABDOMEN PELVIS W CONTRAST  Result Date: 09/27/2022 CLINICAL DATA:  LUQ pain.  Rectal cancer. EXAM: CT ABDOMEN AND PELVIS WITH CONTRAST TECHNIQUE: Multidetector CT imaging of the abdomen and pelvis was performed using the standard protocol following bolus administration of intravenous contrast. RADIATION DOSE REDUCTION: This exam was performed according to the departmental dose-optimization program which includes automated exposure control, adjustment of the mA and/or kV according to patient size and/or use of iterative reconstruction technique. CONTRAST:  80 mL OMNIPAQUE IOHEXOL 300 MG/ML  SOLN COMPARISON:  05/17/2022 FINDINGS: Lower chest: Dependent subsegmental atelectasis or scarring. No pleural or pericardial effusion. Hepatobiliary: No focal liver abnormality is seen. No gallstones, gallbladder wall thickening, or biliary dilatation. Pancreas: Unremarkable. No pancreatic ductal dilatation or surrounding inflammatory changes. Spleen: Normal in size without focal abnormality. Adrenals/Urinary Tract: Adrenal glands  are unremarkable. Kidneys are normal, without renal calculi, focal lesion, or hydronephrosis. Bladder is unremarkable. Stomach/Bowel: Right lower quadrant ileostomy. No bowel dilatation to suggest obstruction. Rectosigmoid bowel anastomosis. Appendix is unremarkable. No bowel wall thickening or pericolonic inflammatory changes to suggest colitis. No evidence of abscess. Vascular/Lymphatic: Aortic atherosclerosis. No enlarged abdominal or pelvic lymph nodes. Reproductive: Prostate is  unremarkable. Other: No abdominal wall hernia or abnormality. No abdominopelvic ascites. Musculoskeletal: No acute or significant osseous findings. IMPRESSION: 1. Status post ileostomy and rectosigmoid resection. No bowel obstruction or inflammatory changes. 2. No evidence of abdominal or pelvic metastatic disease. 3. No acute abdominal or pelvic pathology identified. Electronically Signed   By: Sammie Bench M.D.   On: 09/27/2022 10:49    Assessment/Plan Principal Problem:   AKI (acute kidney injury) ([place]) -patient presents with about 5 days of copious watery ostomy output, this is likely the cause of his dehydration and resulted acute kidney injury.  Especially in the setting of continued losartan which he takes for his blood pressure.  I suspect that his increased ostomy output may be related to Pepcid which was started just the day prior to his diarrhea. -Inpatient admission -Hold nephrotoxins including losartan -Hydrate with normal saline -Discontinue Pepcid, start IV PPI while in the hospital (may want to consider a different agent at discharge) -Note normal kidneys seen on CT abdomen pelvis -Recheck renal function in the morning Active Problems:   HTN (hypertension) -hold losartan and amlodipine due to AKI and borderline low blood pressure.  Will continue his home Coreg.   Hyperlipidemia LDL goal <70   Diabetes mellitus type 2 in obese ([place]) -diabetic diet when eating, will hold metformin and Januvia.  Sliding  scale insulin in the meantime   Esophageal reflux -IV Protonix as mentioned above   CAD (coronary artery disease)   Ileostomy in place [facility])   Dehydration  DVT prophylaxis: Lovenox and SCDs  Code Status: Full code is confirmed with the patient at the time of admission.  Family Communication: None present  Disposition Plan: Expect the patient will be discharged home when medically stable.  Consults called: None  Admission status: Observation  Time spent: 38 minutes  Elyce Zollinger Neva Seat MD [facility] Pager [phone]  If 7PM-7AM, please contact night-coverage [email or web address] Password [email or web address]  09/27/2022, 1:25 PM

## 2022-09-27 NOTE — Progress Notes (Signed)
Patient is alert and oriented X4, call bell within reach, food tray ordered and delivered to room, PRN Zofran administered and admission questions answered. VSS.

## 2022-09-27 NOTE — Progress Notes (Addendum)
Larence Rabe   DOB:Jan 09, 1971   [id number]   [phone]  Med/onc follow up   Subjective: Patient is well-known to me, under my care for his rectal cancer.  He is scheduled to start adjuvant chemotherapy tomorrow.  He presented with worsening diarrhea for the past 4 5 days, since he switched omeprazole to Pepcid, because he was started oral chemo Xeloda which has interaction with omeprazole.  He was admitted for AKI.  He denies fever, had 1 episode of vomiting, occasional abdominal cramps, just feel very tired.   Objective:  Vitals:   09/27/22 1356 09/27/22 1547  BP:  123/78  Pulse:  77  Resp:  20  Temp: 98.8 F (37.1 C) 98.3 F (36.8 C)  SpO2:  94%    There is no height or weight on file to calculate BMI.  Intake/Output Summary (Last 24 hours) at 09/27/2022 1749 Last data filed at 09/27/2022 1748 Gross per 24 hour  Intake 240 ml  Output 950 ml  Net -710 ml     Sclerae unicteric  Oropharynx clear  No peripheral adenopathy  Lungs clear -- no rales or rhonchi  Heart regular rate and rhythm  Abdomen soft, (+) ostomy bag   MSK no focal spinal tenderness, no peripheral edema  Neuro nonfocal    CBG (last 3)  Recent Labs    09/27/22 1745  GLUCAP 234*     Labs:   Urine Studies No results for input(s): "UHGB", "CRYS" in the last 72 hours.  Invalid input(s): "UACOL", "UAPR", "USPG", "UPH", "UTP", "UGL", "UKET", "UBIL", "UNIT", "UROB", "ULEU", "UEPI", "UWBC", "URBC", "UBAC", "CAST", "UCOM", "BILUA"  Basic Metabolic Panel: Recent Labs  Lab 09/27/22 0807 09/27/22 1405  NA 132*  --   K 4.8  --   CL 96*  --   CO2 22  --   GLUCOSE 171*  --   BUN 38*  --   CREATININE 1.93* 1.67*  CALCIUM 9.6  --   MG 2.3  --    GFR Estimated Creatinine Clearance: 71.8 mL/min (A) (by C-G formula based on SCr of 1.67 mg/dL (H)). Liver Function Tests: Recent Labs  Lab 09/27/22 0807  AST 36  ALT 28  ALKPHOS 71  BILITOT 0.9  PROT 8.1  ALBUMIN 4.6   Recent Labs  Lab  09/27/22 0807  LIPASE 196*   No results for input(s): "AMMONIA" in the last 168 hours. Coagulation profile No results for input(s): "INR", "PROTIME" in the last 168 hours.  CBC: Recent Labs  Lab 09/27/22 0807 09/27/22 1405  WBC 9.3 9.0  NEUTROABS 6.8  --   HGB 13.5 13.5  HCT 43.1 42.1  MCV 77.5* 78.1*  PLT 218 207   Cardiac Enzymes: No results for input(s): "CKTOTAL", "CKMB", "CKMBINDEX", "TROPONINI" in the last 168 hours. BNP: Invalid input(s): "POCBNP" CBG: Recent Labs  Lab 09/21/22 1049 09/21/22 1329 09/27/22 1745  GLUCAP 124* 126* 234*   D-Dimer No results for input(s): "DDIMER" in the last 72 hours. Hgb A1c No results for input(s): "HGBA1C" in the last 72 hours. Lipid Profile No results for input(s): "CHOL", "HDL", "LDLCALC", "TRIG", "CHOLHDL", "LDLDIRECT" in the last 72 hours. Thyroid function studies No results for input(s): "TSH", "T4TOTAL", "T3FREE", "THYROIDAB" in the last 72 hours.  Invalid input(s): "FREET3" Anemia work up No results for input(s): "VITAMINB12", "FOLATE", "FERRITIN", "TIBC", "IRON", "RETICCTPCT" in the last 72 hours. Microbiology Recent Results (from the past 240 hour(s))  Resp panel by RT-PCR (RSV, Flu A&B, Covid) Anterior Nasal Swab  Status: None   Collection Time: 09/27/22  7:53 AM   Specimen: Anterior Nasal Swab  Result Value Ref Range Status   SARS Coronavirus 2 by RT PCR NEGATIVE NEGATIVE Final    Comment: (NOTE) SARS-CoV-2 target nucleic acids are NOT DETECTED.  The SARS-CoV-2 RNA is generally detectable in upper respiratory specimens during the acute phase of infection. The lowest concentration of SARS-CoV-2 viral copies this assay can detect is 138 copies/mL. A negative result does not preclude SARS-Cov-2 infection and should not be used as the sole basis for treatment or other patient management decisions. A negative result may occur with  improper specimen collection/handling, submission of specimen other than  nasopharyngeal swab, presence of viral mutation(s) within the areas targeted by this assay, and inadequate number of viral copies(<138 copies/mL). A negative result must be combined with clinical observations, patient history, and epidemiological information. The expected result is Negative.  Fact Sheet for Patients:  EntrepreneurPulse.com.au  Fact Sheet for Healthcare Providers:  IncredibleEmployment.be  This test is no t yet approved or cleared by the [place] FDA and  has been authorized for detection and/or diagnosis of SARS-CoV-2 by FDA under an Emergency Use Authorization (EUA). This EUA will remain  in effect (meaning this test can be used) for the duration of the COVID-19 declaration under Section 564(b)(1) of the Act, 21 U.S.C.section 360bbb-3(b)(1), unless the authorization is terminated  or revoked sooner.       Influenza A by PCR NEGATIVE NEGATIVE Final   Influenza B by PCR NEGATIVE NEGATIVE Final    Comment: (NOTE) The Xpert Xpress SARS-CoV-2/FLU/RSV plus assay is intended as an aid in the diagnosis of influenza from Nasopharyngeal swab specimens and should not be used as a sole basis for treatment. Nasal washings and aspirates are unacceptable for Xpert Xpress SARS-CoV-2/FLU/RSV testing.  Fact Sheet for Patients: EntrepreneurPulse.com.au  Fact Sheet for Healthcare Providers: IncredibleEmployment.be  This test is not yet approved or cleared by the [place] FDA and has been authorized for detection and/or diagnosis of SARS-CoV-2 by FDA under an Emergency Use Authorization (EUA). This EUA will remain in effect (meaning this test can be used) for the duration of the COVID-19 declaration under Section 564(b)(1) of the Act, 21 U.S.C. section 360bbb-3(b)(1), unless the authorization is terminated or revoked.     Resp Syncytial Virus by PCR NEGATIVE NEGATIVE Final    Comment:  (NOTE) Fact Sheet for Patients: EntrepreneurPulse.com.au  Fact Sheet for Healthcare Providers: IncredibleEmployment.be  This test is not yet approved or cleared by the [place] FDA and has been authorized for detection and/or diagnosis of SARS-CoV-2 by FDA under an Emergency Use Authorization (EUA). This EUA will remain in effect (meaning this test can be used) for the duration of the COVID-19 declaration under Section 564(b)(1) of the Act, 21 U.S.C. section 360bbb-3(b)(1), unless the authorization is terminated or revoked.  Performed at [facility], [place] [address], [place], [place] [address]       Studies:  CT ABDOMEN PELVIS W CONTRAST  Result Date: 09/27/2022 CLINICAL DATA:  LUQ pain.  Rectal cancer. EXAM: CT ABDOMEN AND PELVIS WITH CONTRAST TECHNIQUE: Multidetector CT imaging of the abdomen and pelvis was performed using the standard protocol following bolus administration of intravenous contrast. RADIATION DOSE REDUCTION: This exam was performed according to the departmental dose-optimization program which includes automated exposure control, adjustment of the mA and/or kV according to patient size and/or use of iterative reconstruction technique. CONTRAST:  80 mL OMNIPAQUE IOHEXOL 300 MG/ML  SOLN COMPARISON:  05/17/2022 FINDINGS: Lower chest: Dependent subsegmental atelectasis or scarring. No pleural or pericardial effusion. Hepatobiliary: No focal liver abnormality is seen. No gallstones, gallbladder wall thickening, or biliary dilatation. Pancreas: Unremarkable. No pancreatic ductal dilatation or surrounding inflammatory changes. Spleen: Normal in size without focal abnormality. Adrenals/Urinary Tract: Adrenal glands are unremarkable. Kidneys are normal, without renal calculi, focal lesion, or hydronephrosis. Bladder is unremarkable. Stomach/Bowel: Right lower quadrant ileostomy. No bowel dilatation to suggest obstruction.  Rectosigmoid bowel anastomosis. Appendix is unremarkable. No bowel wall thickening or pericolonic inflammatory changes to suggest colitis. No evidence of abscess. Vascular/Lymphatic: Aortic atherosclerosis. No enlarged abdominal or pelvic lymph nodes. Reproductive: Prostate is unremarkable. Other: No abdominal wall hernia or abnormality. No abdominopelvic ascites. Musculoskeletal: No acute or significant osseous findings. IMPRESSION: 1. Status post ileostomy and rectosigmoid resection. No bowel obstruction or inflammatory changes. 2. No evidence of abdominal or pelvic metastatic disease. 3. No acute abdominal or pelvic pathology identified. Electronically Signed   By: Sammie Bench M.D.   On: 09/27/2022 10:49    Assessment: 52 y.o. male   AKI secondary to dehydration from diarrhea Stage II rectal cancer, status post surgery, pending adjuvant chemo  DM HTN CAD    Plan:  -lab and CT scan reviewed and discussed with pt  -he is getting IVF for hydration -I ordered GI panel on his stool, to rule out infectious cause  -he is on Imodium as needed.  If GI panel negative, please add on Lomotil as needed for his diarrhea -Will cancel his chemo tomorrow.  I discussed switching chemo regiment from CapeOx to FOLFOX, to avoid interaction with PPI.  I will tentatively plan to start him on the week of 2/26. He agrees with the plan -I will follow up as needed.    Truitt Merle, MD 09/27/2022  5:49 PM

## 2022-09-27 NOTE — TOC Progression Note (Signed)
Transition of Care [facility]) - Progression Note    Patient Details  Name: Robert [name] MRN: [id number] Date of Birth: 05-Jul-1971  Transition of Care [facility]) CM/SW [place], RN Phone Number:[phone]  09/27/2022, 3:28 PM  Clinical Narrative:     Transition of Care (TOC) Screening Note   Patient Details  Name: Robert [name] Date of Birth: [birth year]   Transition of Care [facility]) CM/SW Contact:    Angelita Ingles, RN Phone Number: 09/27/2022, 3:28 PM    Transition of Care Department [facility]) has reviewed patient and no TOC needs have been identified at this time. We will continue to monitor patient advancement through interdisciplinary progression rounds. If new patient transition needs arise, please place a TOC consult.          Expected Discharge Plan and Services                                               Social Determinants of Health (SDOH) Interventions [place]: Low Risk  (09/27/2022)  Transportation Needs: No Transportation Needs (09/27/2022)  Depression (PHQ2-9): Low Risk  (09/18/2022)  Tobacco Use: Medium Risk (09/27/2022)    Readmission Risk Interventions     No data to display

## 2022-09-28 ENCOUNTER — Other Ambulatory Visit: Payer: Self-pay

## 2022-09-28 ENCOUNTER — Inpatient Hospital Stay: Payer: BLUE CROSS/BLUE SHIELD

## 2022-09-28 ENCOUNTER — Other Ambulatory Visit: Payer: BLUE CROSS/BLUE SHIELD

## 2022-09-28 ENCOUNTER — Inpatient Hospital Stay: Payer: BLUE CROSS/BLUE SHIELD | Admitting: Hematology

## 2022-09-28 DIAGNOSIS — E860 Dehydration: Secondary | ICD-10-CM

## 2022-09-28 DIAGNOSIS — Z932 Ileostomy status: Secondary | ICD-10-CM

## 2022-09-28 DIAGNOSIS — E669 Obesity, unspecified: Secondary | ICD-10-CM

## 2022-09-28 DIAGNOSIS — K219 Gastro-esophageal reflux disease without esophagitis: Secondary | ICD-10-CM

## 2022-09-28 DIAGNOSIS — E1165 Type 2 diabetes mellitus with hyperglycemia: Secondary | ICD-10-CM | POA: Diagnosis present

## 2022-09-28 DIAGNOSIS — Z87891 Personal history of nicotine dependence: Secondary | ICD-10-CM | POA: Diagnosis not present

## 2022-09-28 DIAGNOSIS — C20 Malignant neoplasm of rectum: Secondary | ICD-10-CM | POA: Diagnosis not present

## 2022-09-28 DIAGNOSIS — N179 Acute kidney failure, unspecified: Secondary | ICD-10-CM | POA: Diagnosis not present

## 2022-09-28 DIAGNOSIS — Z959 Presence of cardiac and vascular implant and graft, unspecified: Secondary | ICD-10-CM | POA: Diagnosis not present

## 2022-09-28 DIAGNOSIS — K911 Postgastric surgery syndromes: Secondary | ICD-10-CM | POA: Diagnosis present

## 2022-09-28 DIAGNOSIS — I10 Essential (primary) hypertension: Secondary | ICD-10-CM | POA: Diagnosis not present

## 2022-09-28 DIAGNOSIS — Z823 Family history of stroke: Secondary | ICD-10-CM | POA: Diagnosis not present

## 2022-09-28 DIAGNOSIS — Z6832 Body mass index (BMI) 32.0-32.9, adult: Secondary | ICD-10-CM | POA: Diagnosis not present

## 2022-09-28 DIAGNOSIS — Z91040 Latex allergy status: Secondary | ICD-10-CM | POA: Diagnosis not present

## 2022-09-28 DIAGNOSIS — I2510 Atherosclerotic heart disease of native coronary artery without angina pectoris: Secondary | ICD-10-CM | POA: Diagnosis not present

## 2022-09-28 DIAGNOSIS — Z79899 Other long term (current) drug therapy: Secondary | ICD-10-CM | POA: Diagnosis not present

## 2022-09-28 DIAGNOSIS — Z888 Allergy status to other drugs, medicaments and biological substances status: Secondary | ICD-10-CM | POA: Diagnosis not present

## 2022-09-28 DIAGNOSIS — Z7984 Long term (current) use of oral hypoglycemic drugs: Secondary | ICD-10-CM | POA: Diagnosis not present

## 2022-09-28 DIAGNOSIS — E1169 Type 2 diabetes mellitus with other specified complication: Secondary | ICD-10-CM | POA: Diagnosis not present

## 2022-09-28 DIAGNOSIS — Z1152 Encounter for screening for COVID-19: Secondary | ICD-10-CM | POA: Diagnosis not present

## 2022-09-28 DIAGNOSIS — I959 Hypotension, unspecified: Secondary | ICD-10-CM | POA: Diagnosis present

## 2022-09-28 DIAGNOSIS — Z8249 Family history of ischemic heart disease and other diseases of the circulatory system: Secondary | ICD-10-CM | POA: Diagnosis not present

## 2022-09-28 DIAGNOSIS — Z886 Allergy status to analgesic agent status: Secondary | ICD-10-CM | POA: Diagnosis not present

## 2022-09-28 DIAGNOSIS — E785 Hyperlipidemia, unspecified: Secondary | ICD-10-CM | POA: Diagnosis present

## 2022-09-28 DIAGNOSIS — Z833 Family history of diabetes mellitus: Secondary | ICD-10-CM | POA: Diagnosis not present

## 2022-09-28 LAB — CBC
HCT: 40.9 % (ref 39.0–52.0)
Hemoglobin: 12.8 g/dL — ABNORMAL LOW (ref 13.0–17.0)
MCH: 24.9 pg — ABNORMAL LOW (ref 26.0–34.0)
MCHC: 31.3 g/dL (ref 30.0–36.0)
MCV: 79.4 fL — ABNORMAL LOW (ref 80.0–100.0)
Platelets: 191 10*3/uL (ref 150–400)
RBC: 5.15 MIL/uL (ref 4.22–5.81)
RDW: 16.7 % — ABNORMAL HIGH (ref 11.5–15.5)
WBC: 8.2 10*3/uL (ref 4.0–10.5)
nRBC: 0 % (ref 0.0–0.2)

## 2022-09-28 LAB — GASTROINTESTINAL PANEL BY PCR, STOOL (REPLACES STOOL CULTURE)

## 2022-09-28 LAB — DIFFERENTIAL
Abs Immature Granulocytes: 0.03 10*3/uL (ref 0.00–0.07)
Basophils Absolute: 0.1 10*3/uL (ref 0.0–0.1)
Basophils Relative: 1 %
Eosinophils Absolute: 0.4 10*3/uL (ref 0.0–0.5)
Eosinophils Relative: 5 %
Immature Granulocytes: 0 %
Lymphocytes Relative: 28 %
Lymphs Abs: 2.3 10*3/uL (ref 0.7–4.0)
Monocytes Absolute: 0.8 10*3/uL (ref 0.1–1.0)
Monocytes Relative: 10 %
Neutro Abs: 4.6 10*3/uL (ref 1.7–7.7)
Neutrophils Relative %: 56 %

## 2022-09-28 LAB — BASIC METABOLIC PANEL
Anion gap: 10 (ref 5–15)
BUN: 35 mg/dL — ABNORMAL HIGH (ref 6–20)
CO2: 25 mmol/L (ref 22–32)
Calcium: 8.6 mg/dL — ABNORMAL LOW (ref 8.9–10.3)
Chloride: 99 mmol/L (ref 98–111)
Creatinine, Ser: 1.89 mg/dL — ABNORMAL HIGH (ref 0.61–1.24)
GFR, Estimated: 42 mL/min — ABNORMAL LOW (ref >60)
Glucose, Bld: 130 mg/dL — ABNORMAL HIGH (ref 70–99)
Potassium: 3.9 mmol/L (ref 3.5–5.1)
Sodium: 134 mmol/L — ABNORMAL LOW (ref 135–145)

## 2022-09-28 LAB — GLUCOSE, CAPILLARY
Glucose-Capillary: 123 mg/dL — ABNORMAL HIGH (ref 70–99)
Glucose-Capillary: 144 mg/dL — ABNORMAL HIGH (ref 70–99)
Glucose-Capillary: 148 mg/dL — ABNORMAL HIGH (ref 70–99)
Glucose-Capillary: 153 mg/dL — ABNORMAL HIGH (ref 70–99)

## 2022-09-28 LAB — PHOSPHORUS: Phosphorus: 4.4 mg/dL (ref 2.5–4.6)

## 2022-09-28 LAB — MAGNESIUM: Magnesium: 2.4 mg/dL (ref 1.7–2.4)

## 2022-09-28 NOTE — Progress Notes (Signed)
PROGRESS NOTE    Robert [name]  [id number] DOB: [birth year] DOA: 09/27/2022 PCP: [name], MD     Brief Narrative:  Robert [name] 52 y.o.WM PMHx DM type II uncontrolled with Hyperglycemia , HTN, recent Dx  of rectal cancer in October 2023 who had ostomy placed about 1 month ago,   Admitted to the hospital with acute renal failure due to dehydration from high ostomy output.  Patient states that he just had a right chest wall port placed on 2/9, and on that same day in preparation for chemotherapy which is scheduled to start this week, his oral Nexium was discontinued and he was placed on Pepcid.  Since the switch to Pepcid, he has had significant daily watery diarrhea, and worsening in his GERD symptoms.  This switch was made due to potential interaction with his planned chemotherapy.  While the patient has not had any fevers, chills, nausea, vomiting, abdominal pain, etc. for the last few days, pretty much every evening he has copious large amounts of watery light brown stool output.  He says that in the last few days, he often will drink water, and feels like the water just immediately comes out of his ostomy.  Is been doing his best to drink plenty of fluids including Gatorade and water.  Due to continuation of the high output, he came to the emergency department for evaluation today.   ED Course: Evaluation in the emergency department revealed normal vital signs.  Lab work is significant only for sodium 132, BUN 38 and creatinine 1.93.  His baseline creatinine is normal at 0.97.  He had CT of the abdomen pelvis with IV contrast, which did not show any acute abnormality, no evidence of metastatic disease.   Subjective: A/O x 4, states feels significantly improved.  Ostomy output decreased.   Assessment & Plan: Covid vaccination;   Principal Problem:   AKI (acute kidney injury) ([place]) Active Problems:   HTN (hypertension)   Hyperlipidemia LDL goal <70   Diabetes mellitus type 2 in  obese [facility])   Esophageal reflux   CAD (coronary artery disease)   Ileostomy in place [facility])   Dehydration   Postsurgical dumping syndrome  Postsurgical dumping syndrome -S/p ileostomy placement - 2/16 patient's ostomy output decreased significantly. - 2/16 loperamide 2 mg BID - See AKI   AKI (acute kidney injury) ([place])   Lab Results  Component Value Date   CREATININE 1.89 (H) 09/28/2022   CREATININE 1.67 (H) 09/27/2022   CREATININE 1.93 (H) 09/27/2022   CREATININE 1.45 (H) 09/13/2022   CREATININE 0.97 08/26/2022  -2/16 normal saline 133mhr -Hold nephro toxins -CT abdomen pelvis showed normal kidneys   Essential HTN - Hold  losartan and amlodipine due to AKI/ borderline hypotension . -Coreg 25 mg BID   CAD - See HTN   DM type II uncontrolled with hyperglycemia -2/5 hemoglobin A1c= 7.2 -2/16 hold Metformin and Januvia -2/16 moderate SSI  HLD - Lipid panel pending.     Mobility Assessment (last 72 hours)     Mobility Assessment     Row Name 09/28/22 0825 09/27/22 1600         Does patient have an order for bedrest or is patient medically unstable No - Continue assessment No - Continue assessment      What is the highest level of mobility based on the progressive mobility assessment? Level 6 (Walks independently in room and hall) - Balance while walking in room without assist - Complete Level 6 (Walks  independently in room and hall) - Balance while walking in room without assist - Complete                     DVT prophylaxis: Lovenox and SCD Code Status: Full Family Communication:  Status is: Inpatient    Dispo: The patient is from: Home              Anticipated d/c is to: Home              Anticipated d/c date is: 3 days              Patient currently is not medically stable to d/c.      Consultants:    Procedures/Significant Events:  2/15 influenza A/B negative 2/15 SARS coronavirus negative 2/15 GI panel by PCR negative   I  have personally reviewed and interpreted all radiology studies and my findings are as above.  VENTILATOR SETTINGS:    Cultures   Antimicrobials:    Devices    LINES / TUBES:      Continuous Infusions:  sodium chloride 125 mL/hr at 09/28/22 1634     Objective: Vitals:   09/27/22 2354 09/28/22 0341 09/28/22 1230 09/28/22 1827  BP: 123/73 122/79 113/75   Pulse: 66 66 (!) 59   Resp: 20 20 16   $ Temp: 98.5 F (36.9 C) 98.5 F (36.9 C) 97.9 F (36.6 C)   TempSrc: Oral Oral Oral   SpO2: 90% 96% 93%   Height:    6' 3"$  (1.905 m)    Intake/Output Summary (Last 24 hours) at 09/28/2022 1841 Last data filed at 09/28/2022 1826 Gross per 24 hour  Intake 1412 ml  Output 700 ml  Net 712 ml   There were no vitals filed for this visit.  Examination:  General: A/O x 4, No acute respiratory distress Eyes: negative scleral hemorrhage, negative anisocoria, negative icterus ENT: Negative Runny nose, negative gingival bleeding, Neck:  Negative scars, masses, torticollis, lymphadenopathy, JVD Lungs: Clear to auscultation bilaterally without wheezes or crackles Cardiovascular: Regular rate and rhythm without murmur gallop or rub normal S1 and S2 Abdomen: OBESE, negative abdominal pain, nondistended, positive soft, bowel sounds, no rebound, no ascites, no appreciable mass, ileostomy in place Extremities: No significant cyanosis, clubbing, or edema bilateral lower extremities Skin: Negative rashes, lesions, ulcers Psychiatric:  Negative depression, negative anxiety, negative fatigue, negative mania  Central nervous system:  Cranial nerves II through XII intact, tongue/uvula midline, all extremities muscle strength 5/5, sensation intact throughout, negative dysarthria, negative expressive aphasia, negative receptive aphasia.  .     Data Reviewed: Care during the described time interval was provided by me .  I have reviewed this patient's available data, including medical history,  events of note, physical examination, and all test results as part of my evaluation.  CBC: Recent Labs  Lab 09/27/22 0807 09/27/22 1405 09/28/22 0617  WBC 9.3 9.0 8.2  NEUTROABS 6.8  --  4.6  HGB 13.5 13.5 12.8*  HCT 43.1 42.1 40.9  MCV 77.5* 78.1* 79.4*  PLT 218 207 [id number]   Basic Metabolic Panel: Recent Labs  Lab 09/27/22 0807 09/27/22 1405 09/28/22 0617  NA 132*  --  134*  K 4.8  --  3.9  CL 96*  --  99  CO2 22  --  25  GLUCOSE 171*  --  130*  BUN 38*  --  35*  CREATININE 1.93* 1.67* 1.89*  CALCIUM 9.6  --  8.6*  MG  2.3  --  2.4  PHOS  --   --  4.4   GFR: Estimated Creatinine Clearance: 63.5 mL/min (A) (by C-G formula based on SCr of 1.89 mg/dL (H)). Liver Function Tests: Recent Labs  Lab 09/27/22 0807  AST 36  ALT 28  ALKPHOS 71  BILITOT 0.9  PROT 8.1  ALBUMIN 4.6   Recent Labs  Lab 09/27/22 0807  LIPASE 196*   No results for input(s): "AMMONIA" in the last 168 hours. Coagulation Profile: No results for input(s): "INR", "PROTIME" in the last 168 hours. Cardiac Enzymes: No results for input(s): "CKTOTAL", "CKMB", "CKMBINDEX", "TROPONINI" in the last 168 hours. BNP (last 3 results) No results for input(s): "PROBNP" in the last 8760 hours. HbA1C: No results for input(s): "HGBA1C" in the last 72 hours. CBG: Recent Labs  Lab 09/27/22 1745 09/27/22 2048 09/28/22 0758 09/28/22 1228 09/28/22 1639  GLUCAP 234* 159* 123* 144* 148*   Lipid Profile: No results for input(s): "CHOL", "HDL", "LDLCALC", "TRIG", "CHOLHDL", "LDLDIRECT" in the last 72 hours. Thyroid Function Tests: No results for input(s): "TSH", "T4TOTAL", "FREET4", "T3FREE", "THYROIDAB" in the last 72 hours. Anemia Panel: No results for input(s): "VITAMINB12", "FOLATE", "FERRITIN", "TIBC", "IRON", "RETICCTPCT" in the last 72 hours. Sepsis Labs: No results for input(s): "PROCALCITON", "LATICACIDVEN" in the last 168 hours.  Recent Results (from the past 240 hour(s))  Resp panel by RT-PCR  (RSV, Flu A&B, Covid) Anterior Nasal Swab     Status: None   Collection Time: 09/27/22  7:53 AM   Specimen: Anterior Nasal Swab  Result Value Ref Range Status   SARS Coronavirus 2 by RT PCR NEGATIVE NEGATIVE Final    Comment: (NOTE) SARS-CoV-2 target nucleic acids are NOT DETECTED.  The SARS-CoV-2 RNA is generally detectable in upper respiratory specimens during the acute phase of infection. The lowest concentration of SARS-CoV-2 viral copies this assay can detect is 138 copies/mL. A negative result does not preclude SARS-Cov-2 infection and should not be used as the sole basis for treatment or other patient management decisions. A negative result may occur with  improper specimen collection/handling, submission of specimen other than nasopharyngeal swab, presence of viral mutation(s) within the areas targeted by this assay, and inadequate number of viral copies(<138 copies/mL). A negative result must be combined with clinical observations, patient history, and epidemiological information. The expected result is Negative.  Fact Sheet for Patients:  EntrepreneurPulse.com.au  Fact Sheet for Healthcare Providers:  IncredibleEmployment.be  This test is no t yet approved or cleared by the [place] FDA and  has been authorized for detection and/or diagnosis of SARS-CoV-2 by FDA under an Emergency Use Authorization (EUA). This EUA will remain  in effect (meaning this test can be used) for the duration of the COVID-19 declaration under Section 564(b)(1) of the Act, 21 U.S.C.section 360bbb-3(b)(1), unless the authorization is terminated  or revoked sooner.       Influenza A by PCR NEGATIVE NEGATIVE Final   Influenza B by PCR NEGATIVE NEGATIVE Final    Comment: (NOTE) The Xpert Xpress SARS-CoV-2/FLU/RSV plus assay is intended as an aid in the diagnosis of influenza from Nasopharyngeal swab specimens and should not be used as a sole basis for  treatment. Nasal washings and aspirates are unacceptable for Xpert Xpress SARS-CoV-2/FLU/RSV testing.  Fact Sheet for Patients: EntrepreneurPulse.com.au  Fact Sheet for Healthcare Providers: IncredibleEmployment.be  This test is not yet approved or cleared by the [place] FDA and has been authorized for detection and/or diagnosis of SARS-CoV-2 by FDA under  an Emergency Use Authorization (EUA). This EUA will remain in effect (meaning this test can be used) for the duration of the COVID-19 declaration under Section 564(b)(1) of the Act, 21 U.S.C. section 360bbb-3(b)(1), unless the authorization is terminated or revoked.     Resp Syncytial Virus by PCR NEGATIVE NEGATIVE Final    Comment: (NOTE) Fact Sheet for Patients: EntrepreneurPulse.com.au  Fact Sheet for Healthcare Providers: IncredibleEmployment.be  This test is not yet approved or cleared by the [place] FDA and has been authorized for detection and/or diagnosis of SARS-CoV-2 by FDA under an Emergency Use Authorization (EUA). This EUA will remain in effect (meaning this test can be used) for the duration of the COVID-19 declaration under Section 564(b)(1) of the Act, 21 U.S.C. section 360bbb-3(b)(1), unless the authorization is terminated or revoked.  Performed at [facility], [place] [address] [place], [place] [address]   Gastrointestinal Panel by PCR , Stool     Status: None   Collection Time: 09/27/22  5:56 PM   Specimen: Stool  Result Value Ref Range Status   Campylobacter species NOT DETECTED NOT DETECTED Final   Plesimonas shigelloides NOT DETECTED NOT DETECTED Final   Salmonella species NOT DETECTED NOT DETECTED Final   Yersinia enterocolitica NOT DETECTED NOT DETECTED Final   Vibrio species NOT DETECTED NOT DETECTED Final   Vibrio cholerae NOT DETECTED NOT DETECTED Final   Enteroaggregative E coli (EAEC) NOT  DETECTED NOT DETECTED Final   Enteropathogenic E coli (EPEC) NOT DETECTED NOT DETECTED Final   Enterotoxigenic E coli (ETEC) NOT DETECTED NOT DETECTED Final   Shiga like toxin producing E coli (STEC) NOT DETECTED NOT DETECTED Final   Shigella/Enteroinvasive E coli (EIEC) NOT DETECTED NOT DETECTED Final   Cryptosporidium NOT DETECTED NOT DETECTED Final   Cyclospora cayetanensis NOT DETECTED NOT DETECTED Final   Entamoeba histolytica NOT DETECTED NOT DETECTED Final   Giardia lamblia NOT DETECTED NOT DETECTED Final   Adenovirus F40/41 NOT DETECTED NOT DETECTED Final   Astrovirus NOT DETECTED NOT DETECTED Final   Norovirus GI/GII NOT DETECTED NOT DETECTED Final   Rotavirus A NOT DETECTED NOT DETECTED Final   Sapovirus (I, II, IV, and V) NOT DETECTED NOT DETECTED Final    Comment: Performed at [facility], [address], [place], [place] [address]         Radiology Studies: CT ABDOMEN PELVIS W CONTRAST  Result Date: 09/27/2022 CLINICAL DATA:  LUQ pain.  Rectal cancer. EXAM: CT ABDOMEN AND PELVIS WITH CONTRAST TECHNIQUE: Multidetector CT imaging of the abdomen and pelvis was performed using the standard protocol following bolus administration of intravenous contrast. RADIATION DOSE REDUCTION: This exam was performed according to the departmental dose-optimization program which includes automated exposure control, adjustment of the mA and/or kV according to patient size and/or use of iterative reconstruction technique. CONTRAST:  80 mL OMNIPAQUE IOHEXOL 300 MG/ML  SOLN COMPARISON:  05/17/2022 FINDINGS: Lower chest: Dependent subsegmental atelectasis or scarring. No pleural or pericardial effusion. Hepatobiliary: No focal liver abnormality is seen. No gallstones, gallbladder wall thickening, or biliary dilatation. Pancreas: Unremarkable. No pancreatic ductal dilatation or surrounding inflammatory changes. Spleen: Normal in size without focal abnormality. Adrenals/Urinary Tract: Adrenal  glands are unremarkable. Kidneys are normal, without renal calculi, focal lesion, or hydronephrosis. Bladder is unremarkable. Stomach/Bowel: Right lower quadrant ileostomy. No bowel dilatation to suggest obstruction. Rectosigmoid bowel anastomosis. Appendix is unremarkable. No bowel wall thickening or pericolonic inflammatory changes to suggest colitis. No evidence of abscess. Vascular/Lymphatic: Aortic atherosclerosis. No enlarged  abdominal or pelvic lymph nodes. Reproductive: Prostate is unremarkable. Other: No abdominal wall hernia or abnormality. No abdominopelvic ascites. Musculoskeletal: No acute or significant osseous findings. IMPRESSION: 1. Status post ileostomy and rectosigmoid resection. No bowel obstruction or inflammatory changes. 2. No evidence of abdominal or pelvic metastatic disease. 3. No acute abdominal or pelvic pathology identified. Electronically Signed   By: Sammie Bench M.D.   On: 09/27/2022 10:49        Scheduled Meds:  carvedilol  25 mg Oral BID   enoxaparin (LOVENOX) injection  60 mg Subcutaneous Q24H   ferrous sulfate  325 mg Oral BID WC   insulin aspart  0-15 Units Subcutaneous TID WC   insulin aspart  0-5 Units Subcutaneous QHS   loperamide  2 mg Oral BID   pantoprazole (PROTONIX) IV  40 mg Intravenous Q24H   Continuous Infusions:  sodium chloride 125 mL/hr at 09/28/22 1634     LOS: 0 days    Time spent:40 min    Luisana Lutzke, Geraldo Docker, MD [facility]   If 7PM-7AM, please contact night-coverage 09/28/2022, 6:41 PM

## 2022-09-29 ENCOUNTER — Encounter (HOSPITAL_COMMUNITY): Payer: Self-pay | Admitting: Internal Medicine

## 2022-09-29 DIAGNOSIS — C20 Malignant neoplasm of rectum: Secondary | ICD-10-CM | POA: Diagnosis not present

## 2022-09-29 DIAGNOSIS — I2510 Atherosclerotic heart disease of native coronary artery without angina pectoris: Secondary | ICD-10-CM | POA: Diagnosis not present

## 2022-09-29 DIAGNOSIS — N179 Acute kidney failure, unspecified: Secondary | ICD-10-CM | POA: Diagnosis not present

## 2022-09-29 DIAGNOSIS — I10 Essential (primary) hypertension: Secondary | ICD-10-CM | POA: Diagnosis not present

## 2022-09-29 DIAGNOSIS — E785 Hyperlipidemia, unspecified: Secondary | ICD-10-CM

## 2022-09-29 LAB — GLUCOSE, CAPILLARY
Glucose-Capillary: 126 mg/dL — ABNORMAL HIGH (ref 70–99)
Glucose-Capillary: 161 mg/dL — ABNORMAL HIGH (ref 70–99)
Glucose-Capillary: 106 mg/dL — ABNORMAL HIGH (ref 70–99)
Glucose-Capillary: 152 mg/dL — ABNORMAL HIGH (ref 70–99)

## 2022-09-29 LAB — LIPID PANEL
Cholesterol: 120 mg/dL (ref 0–200)
HDL: 27 mg/dL — ABNORMAL LOW (ref >40)
LDL Cholesterol: 72 mg/dL (ref 0–99)
Total CHOL/HDL Ratio: 4.4 RATIO
Triglycerides: 107 mg/dL (ref <150)
VLDL: 21 mg/dL (ref 0–40)

## 2022-09-29 LAB — CBC WITH DIFFERENTIAL/PLATELET
Abs Immature Granulocytes: 0.02 10*3/uL (ref 0.00–0.07)
Basophils Absolute: 0.1 10*3/uL (ref 0.0–0.1)
Basophils Relative: 1 %
Eosinophils Absolute: 0.3 10*3/uL (ref 0.0–0.5)
Eosinophils Relative: 6 %
HCT: 37.5 % — ABNORMAL LOW (ref 39.0–52.0)
Hemoglobin: 11.8 g/dL — ABNORMAL LOW (ref 13.0–17.0)
Immature Granulocytes: 0 %
Lymphocytes Relative: 30 %
Lymphs Abs: 1.6 10*3/uL (ref 0.7–4.0)
MCH: 25.3 pg — ABNORMAL LOW (ref 26.0–34.0)
MCHC: 31.5 g/dL (ref 30.0–36.0)
MCV: 80.5 fL (ref 80.0–100.0)
Monocytes Absolute: 0.5 10*3/uL (ref 0.1–1.0)
Monocytes Relative: 9 %
Neutro Abs: 2.9 10*3/uL (ref 1.7–7.7)
Neutrophils Relative %: 54 %
Platelets: 168 10*3/uL (ref 150–400)
RBC: 4.66 MIL/uL (ref 4.22–5.81)
RDW: 16.5 % — ABNORMAL HIGH (ref 11.5–15.5)
WBC: 5.4 10*3/uL (ref 4.0–10.5)
nRBC: 0 % (ref 0.0–0.2)

## 2022-09-29 LAB — COMPREHENSIVE METABOLIC PANEL
ALT: 19 U/L (ref 0–44)
AST: 17 U/L (ref 15–41)
Albumin: 3.4 g/dL — ABNORMAL LOW (ref 3.5–5.0)
Alkaline Phosphatase: 56 U/L (ref 38–126)
Anion gap: 6 (ref 5–15)
BUN: 23 mg/dL — ABNORMAL HIGH (ref 6–20)
CO2: 24 mmol/L (ref 22–32)
Calcium: 8.7 mg/dL — ABNORMAL LOW (ref 8.9–10.3)
Chloride: 107 mmol/L (ref 98–111)
Creatinine, Ser: 1.38 mg/dL — ABNORMAL HIGH (ref 0.61–1.24)
GFR, Estimated: >60 mL/min (ref >60)
Glucose, Bld: 124 mg/dL — ABNORMAL HIGH (ref 70–99)
Potassium: 4.7 mmol/L (ref 3.5–5.1)
Sodium: 137 mmol/L (ref 135–145)
Total Bilirubin: 0.4 mg/dL (ref 0.3–1.2)
Total Protein: 6 g/dL — ABNORMAL LOW (ref 6.5–8.1)

## 2022-09-29 LAB — PHOSPHORUS: Phosphorus: 3.3 mg/dL (ref 2.5–4.6)

## 2022-09-29 LAB — MAGNESIUM: Magnesium: 2.2 mg/dL (ref 1.7–2.4)

## 2022-09-29 LAB — OCCULT BLOOD X 1 CARD TO LAB, STOOL: Fecal Occult Bld: POSITIVE — AB

## 2022-09-29 MED ORDER — PANTOPRAZOLE SODIUM 40 MG PO TBEC
40.0000 mg | DELAYED_RELEASE_TABLET | Freq: Every day | ORAL | Status: DC
  Administered 2022-09-29 – 2022-09-30 (×2): 40 mg via ORAL
  Filled 2022-09-29 (×2): qty 1

## 2022-09-29 NOTE — Progress Notes (Signed)
PROGRESS NOTE    Robert [name]  [id number] DOB: 1970-10-11 DOA: 09/27/2022 PCP: [name], MD     Brief Narrative:  [name] 52 y.o.WM PMHx DM type II uncontrolled with Hyperglycemia , HTN, recent Dx  of rectal cancer in October 2023 who had ostomy placed about 1 month ago,   Admitted to the hospital with acute renal failure due to dehydration from high ostomy output.  Patient states that he just had a right chest wall port placed on 2/9, and on that same day in preparation for chemotherapy which is scheduled to start this week, his oral Nexium was discontinued and he was placed on Pepcid.  Since the switch to Pepcid, he has had significant daily watery diarrhea, and worsening in his GERD symptoms.  This switch was made due to potential interaction with his planned chemotherapy.  While the patient has not had any fevers, chills, nausea, vomiting, abdominal pain, etc. for the last few days, pretty much every evening he has copious large amounts of watery light brown stool output.  He says that in the last few days, he often will drink water, and feels like the water just immediately comes out of his ostomy.  Is been doing his best to drink plenty of fluids including Gatorade and water.  Due to continuation of the high output, he came to the emergency department for evaluation today.   ED Course: Evaluation in the emergency department revealed normal vital signs.  Lab work is significant only for sodium 132, BUN 38 and creatinine 1.93.  His baseline creatinine is normal at 0.97.  He had CT of the abdomen pelvis with IV contrast, which did not show any acute abnormality, no evidence of metastatic disease.   Subjective: 12/17 afebrile overnight, A/O x 4, tolerated meals overnight.  Negative increased ostomy output.  Negative nausea, negative vomiting   Assessment & Plan: Covid vaccination;   Principal Problem:   AKI (acute kidney injury) ([place]) Active Problems:   HTN  (hypertension)   Hyperlipidemia LDL goal <70   Diabetes mellitus type 2 in obese [facility])   Esophageal reflux   CAD (coronary artery disease)   Ileostomy in place [facility])   Dehydration   Postsurgical dumping syndrome  Postsurgical dumping syndrome -S/p ileostomy placement - 2/16 patient's ostomy output decreased significantly. - 2/16 loperamide 2 mg BID -2/17 resolved. - See AKI   AKI (acute kidney injury) ([place])   Lab Results  Component Value Date   CREATININE 1.38 (H) 09/29/2022   CREATININE 1.89 (H) 09/28/2022   CREATININE 1.67 (H) 09/27/2022   CREATININE 1.93 (H) 09/27/2022   CREATININE 1.45 (H) 09/13/2022  -2/16 normal saline 165mhr -Hold nephro toxins -CT abdomen pelvis showed normal kidneys -2/17 continued rehydration.  Renal function improving   Essential HTN - Hold  losartan and amlodipine due to AKI/ borderline hypotension . -Coreg 25 mg BID   CAD - See HTN   DM type II uncontrolled with hyperglycemia -2/5 hemoglobin A1c= 7.2 -2/16 hold Metformin and Januvia -2/16 moderate SSI CBG (last 3)  Recent Labs    09/28/22 2119 09/29/22 0800 09/29/22 1145  GLUCAP 153* 126* 161*     HLD - 2/17 LDL = 72 will not add any medication.       Mobility Assessment (last 72 hours)     Mobility Assessment     Row Name 09/29/22 0835 09/28/22 2123 09/28/22 0825 09/27/22 1600     Does patient have an order for bedrest or is patient  medically unstable No - Continue assessment No - Continue assessment No - Continue assessment No - Continue assessment    What is the highest level of mobility based on the progressive mobility assessment? Level 6 (Walks independently in room and hall) - Balance while walking in room without assist - Complete Level 6 (Walks independently in room and hall) - Balance while walking in room without assist - Complete Level 6 (Walks independently in room and hall) - Balance while walking in room without assist - Complete Level 6 (Walks independently  in room and hall) - Balance while walking in room without assist - Complete                   DVT prophylaxis: Lovenox and SCD Code Status: Full Family Communication: 2/17 wife at bedside for discussion of plan of care all questions answered. Status is: Inpatient    Dispo: The patient is from: Home              Anticipated d/c is to: Home              Anticipated d/c date is: 3 days              Patient currently is not medically stable to d/c.      Consultants:    Procedures/Significant Events:  2/15 influenza A/B negative 2/15 SARS coronavirus negative 2/15 GI panel by PCR negative   I have personally reviewed and interpreted all radiology studies and my findings are as above.  VENTILATOR SETTINGS:    Cultures   Antimicrobials:    Devices    LINES / TUBES:      Continuous Infusions:  sodium chloride 125 mL/hr at 09/29/22 1107     Objective: Vitals:   09/28/22 2020 09/29/22 0632 09/29/22 0638 09/29/22 0845  BP: (!) 142/78 134/84  126/72  Pulse: 77 (!) 54  64  Resp: 20 18  19  $ Temp: 98.4 F (36.9 C) 97.8 F (36.6 C)  98.1 F (36.7 C)  TempSrc: Oral Oral  Oral  SpO2: 97% 95%  95%  Weight:   118.4 kg   Height:        Intake/Output Summary (Last 24 hours) at 09/29/2022 1237 Last data filed at 09/29/2022 1107 Gross per 24 hour  Intake 3734.9 ml  Output 200 ml  Net 3534.9 ml    Filed Weights   09/29/22 [id number]  Weight: 118.4 kg   Physical Exam:  General: A/O x 4, No acute respiratory distress Eyes: negative scleral hemorrhage, negative anisocoria, negative icterus ENT: Negative Runny nose, negative gingival bleeding, Neck:  Negative scars, masses, torticollis, lymphadenopathy, JVD Lungs: Clear to auscultation bilaterally without wheezes or crackles Cardiovascular: Regular rate and rhythm without murmur gallop or rub normal S1 and S2 Abdomen: OBESE, negative abdominal pain, nondistended, positive soft, bowel sounds, no rebound,  no ascites, no appreciable mass, ileostomy bag in place Extremities: No significant cyanosis, clubbing, or edema bilateral lower extremities Skin: Negative rashes, lesions, ulcers Psychiatric:  Negative depression, negative anxiety, negative fatigue, negative mania  Central nervous system:  Cranial nerves II through XII intact, tongue/uvula midline, all extremities muscle strength 5/5, sensation intact throughout, negative dysarthria, negative expressive aphasia, negative receptive aphasia.   .     Data Reviewed: Care during the described time interval was provided by me .  I have reviewed this patient's available data, including medical history, events of note, physical examination, and all test results as part of my evaluation.  CBC:  Recent Labs  Lab 09/27/22 0807 09/27/22 1405 09/28/22 0617 09/29/22 0735  WBC 9.3 9.0 8.2 5.4  NEUTROABS 6.8  --  4.6 2.9  HGB 13.5 13.5 12.8* 11.8*  HCT 43.1 42.1 40.9 37.5*  MCV 77.5* 78.1* 79.4* 80.5  PLT 218 207 191 [id number]    Basic Metabolic Panel: Recent Labs  Lab 09/27/22 0807 09/27/22 1405 09/28/22 0617 09/29/22 0735  NA 132*  --  134* 137  K 4.8  --  3.9 4.7  CL 96*  --  99 107  CO2 22  --  25 24  GLUCOSE 171*  --  130* 124*  BUN 38*  --  35* 23*  CREATININE 1.93* 1.67* 1.89* 1.38*  CALCIUM 9.6  --  8.6* 8.7*  MG 2.3  --  2.4 2.2  PHOS  --   --  4.4 3.3    GFR: Estimated Creatinine Clearance: 85.9 mL/min (A) (by C-G formula based on SCr of 1.38 mg/dL (H)). Liver Function Tests: Recent Labs  Lab 09/27/22 0807 09/29/22 0735  AST 36 17  ALT 28 19  ALKPHOS 71 56  BILITOT 0.9 0.4  PROT 8.1 6.0*  ALBUMIN 4.6 3.4*    Recent Labs  Lab 09/27/22 0807  LIPASE 196*    No results for input(s): "AMMONIA" in the last 168 hours. Coagulation Profile: No results for input(s): "INR", "PROTIME" in the last 168 hours. Cardiac Enzymes: No results for input(s): "CKTOTAL", "CKMB", "CKMBINDEX", "TROPONINI" in the last 168 hours. BNP  (last 3 results) No results for input(s): "PROBNP" in the last 8760 hours. HbA1C: No results for input(s): "HGBA1C" in the last 72 hours. CBG: Recent Labs  Lab 09/28/22 1228 09/28/22 1639 09/28/22 2119 09/29/22 0800 09/29/22 1145  GLUCAP 144* 148* 153* 126* 161*    Lipid Profile: Recent Labs    09/29/22 0735  CHOL 120  HDL 27*  LDLCALC 72  TRIG 107  CHOLHDL 4.4   Thyroid Function Tests: No results for input(s): "TSH", "T4TOTAL", "FREET4", "T3FREE", "THYROIDAB" in the last 72 hours. Anemia Panel: No results for input(s): "VITAMINB12", "FOLATE", "FERRITIN", "TIBC", "IRON", "RETICCTPCT" in the last 72 hours. Sepsis Labs: No results for input(s): "PROCALCITON", "LATICACIDVEN" in the last 168 hours.  Recent Results (from the past 240 hour(s))  Resp panel by RT-PCR (RSV, Flu A&B, Covid) Anterior Nasal Swab     Status: None   Collection Time: 09/27/22  7:53 AM   Specimen: Anterior Nasal Swab  Result Value Ref Range Status   SARS Coronavirus 2 by RT PCR NEGATIVE NEGATIVE Final    Comment: (NOTE) SARS-CoV-2 target nucleic acids are NOT DETECTED.  The SARS-CoV-2 RNA is generally detectable in upper respiratory specimens during the acute phase of infection. The lowest concentration of SARS-CoV-2 viral copies this assay can detect is 138 copies/mL. A negative result does not preclude SARS-Cov-2 infection and should not be used as the sole basis for treatment or other patient management decisions. A negative result may occur with  improper specimen collection/handling, submission of specimen other than nasopharyngeal swab, presence of viral mutation(s) within the areas targeted by this assay, and inadequate number of viral copies(<138 copies/mL). A negative result must be combined with clinical observations, patient history, and epidemiological information. The expected result is Negative.  Fact Sheet for Patients:  EntrepreneurPulse.com.au  Fact Sheet for  Healthcare Providers:  IncredibleEmployment.be  This test is no t yet approved or cleared by the [place] FDA and  has been authorized for detection and/or diagnosis of SARS-CoV-2  by FDA under an Emergency Use Authorization (EUA). This EUA will remain  in effect (meaning this test can be used) for the duration of the COVID-19 declaration under Section 564(b)(1) of the Act, 21 U.S.C.section 360bbb-3(b)(1), unless the authorization is terminated  or revoked sooner.       Influenza A by PCR NEGATIVE NEGATIVE Final   Influenza B by PCR NEGATIVE NEGATIVE Final    Comment: (NOTE) The Xpert Xpress SARS-CoV-2/FLU/RSV plus assay is intended as an aid in the diagnosis of influenza from Nasopharyngeal swab specimens and should not be used as a sole basis for treatment. Nasal washings and aspirates are unacceptable for Xpert Xpress SARS-CoV-2/FLU/RSV testing.  Fact Sheet for Patients: EntrepreneurPulse.com.au  Fact Sheet for Healthcare Providers: IncredibleEmployment.be  This test is not yet approved or cleared by the [place] FDA and has been authorized for detection and/or diagnosis of SARS-CoV-2 by FDA under an Emergency Use Authorization (EUA). This EUA will remain in effect (meaning this test can be used) for the duration of the COVID-19 declaration under Section 564(b)(1) of the Act, 21 U.S.C. section 360bbb-3(b)(1), unless the authorization is terminated or revoked.     Resp Syncytial Virus by PCR NEGATIVE NEGATIVE Final    Comment: (NOTE) Fact Sheet for Patients: EntrepreneurPulse.com.au  Fact Sheet for Healthcare Providers: IncredibleEmployment.be  This test is not yet approved or cleared by the [place] FDA and has been authorized for detection and/or diagnosis of SARS-CoV-2 by FDA under an Emergency Use Authorization (EUA). This EUA will remain in effect (meaning this  test can be used) for the duration of the COVID-19 declaration under Section 564(b)(1) of the Act, 21 U.S.C. section 360bbb-3(b)(1), unless the authorization is terminated or revoked.  Performed at [facility], [place] [address] [place], [place] [address]   Gastrointestinal Panel by PCR , Stool     Status: None   Collection Time: 09/27/22  5:56 PM   Specimen: Stool  Result Value Ref Range Status   Campylobacter species NOT DETECTED NOT DETECTED Final   Plesimonas shigelloides NOT DETECTED NOT DETECTED Final   Salmonella species NOT DETECTED NOT DETECTED Final   Yersinia enterocolitica NOT DETECTED NOT DETECTED Final   Vibrio species NOT DETECTED NOT DETECTED Final   Vibrio cholerae NOT DETECTED NOT DETECTED Final   Enteroaggregative E coli (EAEC) NOT DETECTED NOT DETECTED Final   Enteropathogenic E coli (EPEC) NOT DETECTED NOT DETECTED Final   Enterotoxigenic E coli (ETEC) NOT DETECTED NOT DETECTED Final   Shiga like toxin producing E coli (STEC) NOT DETECTED NOT DETECTED Final   Shigella/Enteroinvasive E coli (EIEC) NOT DETECTED NOT DETECTED Final   Cryptosporidium NOT DETECTED NOT DETECTED Final   Cyclospora cayetanensis NOT DETECTED NOT DETECTED Final   Entamoeba histolytica NOT DETECTED NOT DETECTED Final   Giardia lamblia NOT DETECTED NOT DETECTED Final   Adenovirus F40/41 NOT DETECTED NOT DETECTED Final   Astrovirus NOT DETECTED NOT DETECTED Final   Norovirus GI/GII NOT DETECTED NOT DETECTED Final   Rotavirus A NOT DETECTED NOT DETECTED Final   Sapovirus (I, II, IV, and V) NOT DETECTED NOT DETECTED Final    Comment: Performed at [facility], [address] [place], [place] [address]         Radiology Studies: No results found.      Scheduled Meds:  carvedilol  25 mg Oral BID   enoxaparin (LOVENOX) injection  60 mg Subcutaneous Q24H   ferrous sulfate  325 mg Oral BID WC  insulin aspart  0-15 Units Subcutaneous TID WC   insulin  aspart  0-5 Units Subcutaneous QHS   loperamide  2 mg Oral BID   pantoprazole  40 mg Oral Daily   Continuous Infusions:  sodium chloride 125 mL/hr at 09/29/22 1107     LOS: 1 day    Time spent:40 min    Sybilla Malhotra, Geraldo Docker, MD [facility]   If 7PM-7AM, please contact night-coverage 09/29/2022, 12:37 PM

## 2022-09-30 ENCOUNTER — Encounter: Payer: Self-pay | Admitting: Family Medicine

## 2022-09-30 DIAGNOSIS — N179 Acute kidney failure, unspecified: Secondary | ICD-10-CM | POA: Diagnosis not present

## 2022-09-30 DIAGNOSIS — E1169 Type 2 diabetes mellitus with other specified complication: Secondary | ICD-10-CM | POA: Diagnosis not present

## 2022-09-30 DIAGNOSIS — C20 Malignant neoplasm of rectum: Secondary | ICD-10-CM | POA: Diagnosis not present

## 2022-09-30 DIAGNOSIS — I2510 Atherosclerotic heart disease of native coronary artery without angina pectoris: Secondary | ICD-10-CM | POA: Diagnosis not present

## 2022-09-30 LAB — GLUCOSE, CAPILLARY
Glucose-Capillary: 93 mg/dL (ref 70–99)
Glucose-Capillary: 131 mg/dL — ABNORMAL HIGH (ref 70–99)
Glucose-Capillary: 115 mg/dL — ABNORMAL HIGH (ref 70–99)

## 2022-09-30 LAB — CBC WITH DIFFERENTIAL/PLATELET
Abs Immature Granulocytes: 0.02 10*3/uL (ref 0.00–0.07)
Basophils Absolute: 0 10*3/uL (ref 0.0–0.1)
Basophils Relative: 1 %
Eosinophils Absolute: 0.3 10*3/uL (ref 0.0–0.5)
Eosinophils Relative: 5 %
HCT: 35 % — ABNORMAL LOW (ref 39.0–52.0)
Hemoglobin: 10.8 g/dL — ABNORMAL LOW (ref 13.0–17.0)
Immature Granulocytes: 0 %
Lymphocytes Relative: 33 %
Lymphs Abs: 2 10*3/uL (ref 0.7–4.0)
MCH: 24.9 pg — ABNORMAL LOW (ref 26.0–34.0)
MCHC: 30.9 g/dL (ref 30.0–36.0)
MCV: 80.8 fL (ref 80.0–100.0)
Monocytes Absolute: 0.5 10*3/uL (ref 0.1–1.0)
Monocytes Relative: 9 %
Neutro Abs: 3.3 10*3/uL (ref 1.7–7.7)
Neutrophils Relative %: 52 %
Platelets: 169 10*3/uL (ref 150–400)
RBC: 4.33 MIL/uL (ref 4.22–5.81)
RDW: 16.7 % — ABNORMAL HIGH (ref 11.5–15.5)
WBC: 6.2 10*3/uL (ref 4.0–10.5)
nRBC: 0 % (ref 0.0–0.2)

## 2022-09-30 LAB — COMPREHENSIVE METABOLIC PANEL
ALT: 16 U/L (ref 0–44)
AST: 17 U/L (ref 15–41)
Albumin: 3.4 g/dL — ABNORMAL LOW (ref 3.5–5.0)
Alkaline Phosphatase: 54 U/L (ref 38–126)
Anion gap: 5 (ref 5–15)
BUN: 17 mg/dL (ref 6–20)
CO2: 21 mmol/L — ABNORMAL LOW (ref 22–32)
Calcium: 8.6 mg/dL — ABNORMAL LOW (ref 8.9–10.3)
Chloride: 111 mmol/L (ref 98–111)
Creatinine, Ser: 1.23 mg/dL (ref 0.61–1.24)
GFR, Estimated: >60 mL/min (ref >60)
Glucose, Bld: 107 mg/dL — ABNORMAL HIGH (ref 70–99)
Potassium: 4 mmol/L (ref 3.5–5.1)
Sodium: 137 mmol/L (ref 135–145)
Total Bilirubin: 0.5 mg/dL (ref 0.3–1.2)
Total Protein: 6.1 g/dL — ABNORMAL LOW (ref 6.5–8.1)

## 2022-09-30 LAB — MAGNESIUM: Magnesium: 2.2 mg/dL (ref 1.7–2.4)

## 2022-09-30 LAB — OCCULT BLOOD X 1 CARD TO LAB, STOOL: Fecal Occult Bld: POSITIVE — AB

## 2022-09-30 LAB — PHOSPHORUS: Phosphorus: 3.8 mg/dL (ref 2.5–4.6)

## 2022-09-30 MED ORDER — PANTOPRAZOLE SODIUM 40 MG PO TBEC: 40.0000 mg | DELAYED_RELEASE_TABLET | Freq: Every day | ORAL | 0 refills | Status: DC

## 2022-09-30 MED ORDER — TRAZODONE HCL 50 MG PO TABS: 25.0000 mg | ORAL_TABLET | Freq: Every evening | ORAL | 0 refills | Status: DC | PRN

## 2022-09-30 MED ORDER — OXYCODONE HCL 5 MG PO TABS: 5.0000 mg | ORAL_TABLET | ORAL | 0 refills | Status: DC | PRN

## 2022-09-30 MED ORDER — FAMOTIDINE 20 MG PO TABS: 20.0000 mg | ORAL_TABLET | Freq: Two times a day (BID) | ORAL | 0 refills | Status: DC | PRN

## 2022-09-30 MED ORDER — ONDANSETRON HCL 4 MG PO TABS: 4.0000 mg | ORAL_TABLET | Freq: Four times a day (QID) | ORAL | 0 refills | Status: DC | PRN

## 2022-09-30 NOTE — Discharge Summary (Signed)
Physician Discharge Summary  Robert [name] [id number] DOB: [birth year] DOA: 09/27/2022  PCP: Robert Lukes, MD  Admit date: 09/27/2022 Discharge date: 09/30/2022  Time spent: 30 minutes  Recommendations for Outpatient Follow-up:   Postsurgical dumping syndrome -S/p ileostomy placement - 2/16 patient's ostomy output decreased significantly. - 2/16 loperamide 2 mg BID -2/17 resolved. - See AKI    AKI (acute kidney injury) [name])  Lab Results  Component Value Date   CREATININE 1.23 09/30/2022   CREATININE 1.38 (H) 09/29/2022   CREATININE 1.89 (H) 09/28/2022   CREATININE 1.67 (H) 09/27/2022   CREATININE 1.93 (H) 09/27/2022  -2/16 normal saline 135mhr -Hold nephro toxins -CT abdomen pelvis showed normal kidneys -2/17 continued rehydration.  Renal function improving -Resolved     Essential HTN - Hold  losartan and amlodipine due to AKI/ borderline hypotension  -PCP/cardiologist to decide when/if to restart losartan and amlodipine. -Coreg 25 mg BID    CAD - See HTN     DM type II uncontrolled with hyperglycemia -2/5 hemoglobin A1c= 7.2 -2/16 hold Metformin and Januvia -2/16 moderate SSI CBG (last 3)  Recent Labs    09/30/22 0733 09/30/22 1143 09/30/22 1619  GLUCAP 93 131* 115*  -Restart home medication  HLD - 2/17 LDL = 72 will not add any medication.     Discharge Diagnoses:  Principal Problem:   AKI (acute kidney injury) ([name] Active Problems:   HTN (hypertension)   Hyperlipidemia LDL goal <70   Diabetes mellitus type 2 in obese ([name]   Esophageal reflux   CAD (coronary artery disease)   Ileostomy in place ([name]   Dehydration   Postsurgical dumping syndrome   Discharge Condition: Stable  Diet recommendation: Carb modified  Filed Weights   09/29/22 0638  Weight: 118.4 kg   History of present illness:  Robert Lombardi89y.o.WM PMHx DM type II uncontrolled with Hyperglycemia , HTN, recent Dx  of rectal cancer in October 2023 who had ostomy  placed about 1 month ago,    Admitted to the [name] with acute renal failure due to dehydration from high ostomy output.  Patient states that he just had a right chest wall port placed on 2/9, and on that same day in preparation for chemotherapy which is scheduled to start this week, his oral Nexium was discontinued and he was placed on Pepcid.  Since the switch to Pepcid, he has had significant daily watery diarrhea, and worsening in his GERD symptoms.  This switch was made due to potential interaction with his planned chemotherapy.  While the patient has not had any fevers, chills, nausea, vomiting, abdominal pain, etc. for the last few days, pretty much every evening he has copious large amounts of watery light brown stool output.  He says that in the last few days, he often will drink water, and feels like the water just immediately comes out of his ostomy.  Is been doing his best to drink plenty of fluids including Gatorade and water.  Due to continuation of the high output, he came to the emergency department for evaluation today.   ED Course: Evaluation in the emergency department revealed normal vital signs.  Lab work is significant only for sodium 132, BUN 38 and creatinine 1.93.  His baseline creatinine is normal at 0.97.  He had CT of the abdomen pelvis with IV contrast, which did not show any acute abnormality, no evidence of metastatic disease.  [name] Course:  See above  Cultures  2/15 influenza A/B negative 2/15 SARS  coronavirus negative 2/15 GI panel by PCR negative   Discharge Exam: Vitals:   09/29/22 1318 09/29/22 2136 09/30/22 0636 09/30/22 1340  BP: 134/82 (!) 149/83 (!) 146/89 132/84  Pulse: (!) 58 62 60 66  Resp: 20 18 20 20  $ Temp: 98 F (36.7 C) 97.7 F (36.5 C) (!) 97.4 F (36.3 C) 98.3 F (36.8 C)  TempSrc: Oral Oral Oral Oral  SpO2: 98% 98% 98% 97%  Weight:      Height:        General: A/O x 4, No acute respiratory distress Eyes: negative scleral  hemorrhage, negative anisocoria, negative icterus ENT: Negative Runny nose, negative gingival bleeding, Neck:  Negative scars, masses, torticollis, lymphadenopathy, JVD Lungs: Clear to auscultation bilaterally without wheezes or crackles Cardiovascular: Regular rate and rhythm without murmur gallop or rub normal S1 and S2  Discharge Instructions  Discharge Instructions     Clinic Appointment Request   Complete by: Oct 08, 2022    Contact your oncology clinic or infusion center to schedule this appointment.   Infusion Appointment Request   Complete by: Oct 08, 2022    Contact your oncology clinic or infusion center to schedule this appointment.   Lab Appointment Request   Complete by: Oct 08, 2022    Contact your oncology clinic or infusion center to schedule this appointment.   IS pump d/c appointment request (30 min)   Complete by: Oct 10, 2022    Contact your oncology clinic or infusion center to schedule this appointment.      Allergies as of 09/30/2022       Reactions   Mucinex [guaifenesin Er] Shortness Of Breath, Itching, Swelling, Dermatitis, Rash   Swelling of hands and feet   Nsaids Shortness Of Breath, Itching, Swelling, Rash   Aleve and Advil    Latex Rash, Other (See Comments)   Cartilage will harden         Medication List     STOP taking these medications    amLODipine 5 MG tablet Commonly known as: NORVASC   losartan 100 MG tablet Commonly known as: COZAAR       TAKE these medications    capecitabine 500 MG tablet Commonly known as: XELODA TAKE 4 TABLETS BY MOUTH 2 TIMES A DAY AFTER A MEAL ON DAYS 1 TO 14 OF A 21 DAY CYCLE.   carvedilol 25 MG tablet Commonly known as: COREG Take 1 tablet (25 mg total) by mouth 2 (two) times daily.   Cool Blood Glucose Test Strips test strip Generic drug: glucose blood Check blood sugar once daily   esomeprazole 20 MG capsule Commonly known as: NEXIUM Take 20 mg by mouth daily.   famotidine 20 MG  tablet Commonly known as: PEPCID Take 1 tablet (20 mg total) by mouth 2 (two) times daily as needed for heartburn or indigestion. What changed:  when to take this reasons to take this   Iron (Ferrous Sulfate) 325 (65 Fe) MG Tabs Take 325 mg by mouth 2 (two) times daily with a meal.   loperamide 2 MG capsule Commonly known as: IMODIUM Take 1 capsule (2 mg total) by mouth 2 (two) times daily.   metFORMIN 500 MG tablet Commonly known as: GLUCOPHAGE Take 1 tablet (500 mg total) by mouth daily with breakfast. What changed: when to take this   ondansetron 4 MG tablet Commonly known as: ZOFRAN Take 1 tablet (4 mg total) by mouth every 6 (six) hours as needed for nausea.   oxyCODONE  5 MG immediate release tablet Commonly known as: Oxy IR/ROXICODONE Take 1 tablet (5 mg total) by mouth every 4 (four) hours as needed for moderate pain.   pantoprazole 40 MG tablet Commonly known as: PROTONIX Take 1 tablet (40 mg total) by mouth daily. Start taking on: October 01, 2022   sitaGLIPtin 100 MG tablet Commonly known as: Januvia Take 1 tablet (100 mg total) by mouth daily.   tiZANidine 2 MG tablet Commonly known as: ZANAFLEX Take 0.5-2 tablets (1-4 mg total) by mouth 2 (two) times daily as needed for muscle spasms. What changed:  how much to take when to take this   traZODone 50 MG tablet Commonly known as: DESYREL Take 0.5 tablets (25 mg total) by mouth at bedtime as needed for sleep.       Allergies  Allergen Reactions   Mucinex [Guaifenesin Er] Shortness Of Breath, Itching, Swelling, Dermatitis and Rash    Swelling of hands and feet   Nsaids Shortness Of Breath, Itching, Swelling and Rash    Aleve and Advil    Latex Rash and Other (See Comments)    Cartilage will harden       The results of significant diagnostics from this hospitalization (including imaging, microbiology, ancillary and laboratory) are listed below for reference.    Significant Diagnostic Studies: CT  ABDOMEN PELVIS W CONTRAST  Result Date: 09/27/2022 CLINICAL DATA:  LUQ pain.  Rectal cancer. EXAM: CT ABDOMEN AND PELVIS WITH CONTRAST TECHNIQUE: Multidetector CT imaging of the abdomen and pelvis was performed using the standard protocol following bolus administration of intravenous contrast. RADIATION DOSE REDUCTION: This exam was performed according to the departmental dose-optimization program which includes automated exposure control, adjustment of the mA and/or kV according to patient size and/or use of iterative reconstruction technique. CONTRAST:  80 mL OMNIPAQUE IOHEXOL 300 MG/ML  SOLN COMPARISON:  05/17/2022 FINDINGS: Lower chest: Dependent subsegmental atelectasis or scarring. No pleural or pericardial effusion. Hepatobiliary: No focal liver abnormality is seen. No gallstones, gallbladder wall thickening, or biliary dilatation. Pancreas: Unremarkable. No pancreatic ductal dilatation or surrounding inflammatory changes. Spleen: Normal in size without focal abnormality. Adrenals/Urinary Tract: Adrenal glands are unremarkable. Kidneys are normal, without renal calculi, focal lesion, or hydronephrosis. Bladder is unremarkable. Stomach/Bowel: Right lower quadrant ileostomy. No bowel dilatation to suggest obstruction. Rectosigmoid bowel anastomosis. Appendix is unremarkable. No bowel wall thickening or pericolonic inflammatory changes to suggest colitis. No evidence of abscess. Vascular/Lymphatic: Aortic atherosclerosis. No enlarged abdominal or pelvic lymph nodes. Reproductive: Prostate is unremarkable. Other: No abdominal wall hernia or abnormality. No abdominopelvic ascites. Musculoskeletal: No acute or significant osseous findings. IMPRESSION: 1. Status post ileostomy and rectosigmoid resection. No bowel obstruction or inflammatory changes. 2. No evidence of abdominal or pelvic metastatic disease. 3. No acute abdominal or pelvic pathology identified. Electronically Signed   By: Sammie Bench M.D.   On:  09/27/2022 10:49   DG CHEST PORT 1 VIEW  Result Date: 09/21/2022 CLINICAL DATA:  Port-A-Cath placement. EXAM: PORTABLE CHEST 1 VIEW COMPARISON:  Chest CTA 07/27/2022. Thoracic spine radiographs 12/05/2020. FINDINGS: 1358 hours. Lordotic positioning. Right IJ Port-A-Cath tip extends to the mid SVC level. The heart appears mildly enlarged. There are new patchy opacities at both lung bases which likely represent atelectasis. The lungs are otherwise clear. There is no pleural effusion or pneumothorax. No evidence of acute fracture. IMPRESSION: 1. Right IJ Port-A-Cath placement as described. No evidence of pneumothorax. 2. New patchy bibasilar opacities, likely atelectasis. Electronically Signed   By: Caryl Comes.D.  On: 09/21/2022 14:15   DG C-Arm 1-60 Min-No Report  Result Date: 09/21/2022 Fluoroscopy was utilized by the requesting physician.  No radiographic interpretation.    Microbiology: Recent Results (from the past 240 hour(s))  Resp panel by RT-PCR (RSV, Flu A&B, Covid) Anterior Nasal Swab     Status: None   Collection Time: 09/27/22  7:53 AM   Specimen: Anterior Nasal Swab  Result Value Ref Range Status   SARS Coronavirus 2 by RT PCR NEGATIVE NEGATIVE Final    Comment: (NOTE) SARS-CoV-2 target nucleic acids are NOT DETECTED.  The SARS-CoV-2 RNA is generally detectable in upper respiratory specimens during the acute phase of infection. The lowest concentration of SARS-CoV-2 viral copies this assay can detect is 138 copies/mL. A negative result does not preclude SARS-Cov-2 infection and should not be used as the sole basis for treatment or other patient management decisions. A negative result may occur with  improper specimen collection/handling, submission of specimen other than nasopharyngeal swab, presence of viral mutation(s) within the areas targeted by this assay, and inadequate number of viral copies(<138 copies/mL). A negative result must be combined with clinical  observations, patient history, and epidemiological information. The expected result is Negative.  Fact Sheet for Patients:  EntrepreneurPulse.com.au  Fact Sheet for Healthcare Providers:  IncredibleEmployment.be  This test is no t yet approved or cleared by the [place] FDA and  has been authorized for detection and/or diagnosis of SARS-CoV-2 by FDA under an Emergency Use Authorization (EUA). This EUA will remain  in effect (meaning this test can be used) for the duration of the COVID-19 declaration under Section 564(b)(1) of the Act, 21 U.S.C.section 360bbb-3(b)(1), unless the authorization is terminated  or revoked sooner.       Influenza A by PCR NEGATIVE NEGATIVE Final   Influenza B by PCR NEGATIVE NEGATIVE Final    Comment: (NOTE) The Xpert Xpress SARS-CoV-2/FLU/RSV plus assay is intended as an aid in the diagnosis of influenza from Nasopharyngeal swab specimens and should not be used as a sole basis for treatment. Nasal washings and aspirates are unacceptable for Xpert Xpress SARS-CoV-2/FLU/RSV testing.  Fact Sheet for Patients: EntrepreneurPulse.com.au  Fact Sheet for Healthcare Providers: IncredibleEmployment.be  This test is not yet approved or cleared by the [place] FDA and has been authorized for detection and/or diagnosis of SARS-CoV-2 by FDA under an Emergency Use Authorization (EUA). This EUA will remain in effect (meaning this test can be used) for the duration of the COVID-19 declaration under Section 564(b)(1) of the Act, 21 U.S.C. section 360bbb-3(b)(1), unless the authorization is terminated or revoked.     Resp Syncytial Virus by PCR NEGATIVE NEGATIVE Final    Comment: (NOTE) Fact Sheet for Patients: EntrepreneurPulse.com.au  Fact Sheet for Healthcare Providers: IncredibleEmployment.be  This test is not yet approved or cleared by  the [place] FDA and has been authorized for detection and/or diagnosis of SARS-CoV-2 by FDA under an Emergency Use Authorization (EUA). This EUA will remain in effect (meaning this test can be used) for the duration of the COVID-19 declaration under Section 564(b)(1) of the Act, 21 U.S.C. section 360bbb-3(b)(1), unless the authorization is terminated or revoked.  Performed at [facility], [place] [address] [place], [place] [address]   Gastrointestinal Panel by PCR , Stool     Status: None   Collection Time: 09/27/22  5:56 PM   Specimen: Stool  Result Value Ref Range Status   Campylobacter species NOT DETECTED NOT DETECTED Final   Plesimonas  shigelloides NOT DETECTED NOT DETECTED Final   Salmonella species NOT DETECTED NOT DETECTED Final   Yersinia enterocolitica NOT DETECTED NOT DETECTED Final   Vibrio species NOT DETECTED NOT DETECTED Final   Vibrio cholerae NOT DETECTED NOT DETECTED Final   Enteroaggregative E coli (EAEC) NOT DETECTED NOT DETECTED Final   Enteropathogenic E coli (EPEC) NOT DETECTED NOT DETECTED Final   Enterotoxigenic E coli (ETEC) NOT DETECTED NOT DETECTED Final   Shiga like toxin producing E coli (STEC) NOT DETECTED NOT DETECTED Final   Shigella/Enteroinvasive E coli (EIEC) NOT DETECTED NOT DETECTED Final   Cryptosporidium NOT DETECTED NOT DETECTED Final   Cyclospora cayetanensis NOT DETECTED NOT DETECTED Final   Entamoeba histolytica NOT DETECTED NOT DETECTED Final   Giardia lamblia NOT DETECTED NOT DETECTED Final   Adenovirus F40/41 NOT DETECTED NOT DETECTED Final   Astrovirus NOT DETECTED NOT DETECTED Final   Norovirus GI/GII NOT DETECTED NOT DETECTED Final   Rotavirus A NOT DETECTED NOT DETECTED Final   Sapovirus (I, II, IV, and V) NOT DETECTED NOT DETECTED Final    Comment: Performed at [facility], [address] [place], [place] [address]     Labs: Basic Metabolic Panel: Recent Labs  Lab 09/27/22 0807  09/27/22 1405 09/28/22 0617 09/29/22 0735 09/30/22 0558  NA 132*  --  134* 137 137  K 4.8  --  3.9 4.7 4.0  CL 96*  --  99 107 111  CO2 22  --  25 24 21*  GLUCOSE 171*  --  130* 124* 107*  BUN 38*  --  35* 23* 17  CREATININE 1.93* 1.67* 1.89* 1.38* 1.23  CALCIUM 9.6  --  8.6* 8.7* 8.6*  MG 2.3  --  2.4 2.2 2.2  PHOS  --   --  4.4 3.3 3.8   Liver Function Tests: Recent Labs  Lab 09/27/22 0807 09/29/22 0735 09/30/22 0558  AST 36 17 17  ALT 28 19 16  $ ALKPHOS 71 56 54  BILITOT 0.9 0.4 0.5  PROT 8.1 6.0* 6.1*  ALBUMIN 4.6 3.4* 3.4*   Recent Labs  Lab 09/27/22 0807  LIPASE 196*   No results for input(s): "AMMONIA" in the last 168 hours. CBC: Recent Labs  Lab 09/27/22 0807 09/27/22 1405 09/28/22 0617 09/29/22 0735 09/30/22 0558  WBC 9.3 9.0 8.2 5.4 6.2  NEUTROABS 6.8  --  4.6 2.9 3.3  HGB 13.5 13.5 12.8* 11.8* 10.8*  HCT 43.1 42.1 40.9 37.5* 35.0*  MCV 77.5* 78.1* 79.4* 80.5 80.8  PLT 218 207 191 168 169   Cardiac Enzymes: No results for input(s): "CKTOTAL", "CKMB", "CKMBINDEX", "TROPONINI" in the last 168 hours. BNP: BNP (last 3 results) No results for input(s): "BNP" in the last 8760 hours.  ProBNP (last 3 results) No results for input(s): "PROBNP" in the last 8760 hours.  CBG: Recent Labs  Lab 09/29/22 1642 09/29/22 2134 09/30/22 0733 09/30/22 1143 09/30/22 1619  GLUCAP 106* 152* 93 131* 115*       Signed:  Dia Crawford, MD [facility]

## 2022-09-30 NOTE — Progress Notes (Signed)
Patient discharged to home. Ambulatory to the car. He verbalizes understanding of discharge instructions. He did have a question about stopping the anti-hypertensives (?), so advised him to check with his PCP about those meds.

## 2022-10-01 ENCOUNTER — Other Ambulatory Visit: Payer: Self-pay | Admitting: Hematology

## 2022-10-01 ENCOUNTER — Telehealth: Payer: Self-pay

## 2022-10-01 NOTE — Transitions of Care (Post Inpatient/ED Visit) (Unsigned)
   10/01/2022  Name: Robert [name] MRN: [id number] DOB: 01-18-1971  Today's TOC FU Call Status: Today's TOC FU Call Status:: Unsuccessul Call (1st Attempt) Unsuccessful Call (1st Attempt) Date: 10/01/22  Attempted to reach the patient regarding the most recent Inpatient/ED visit.  Follow Up Plan: Additional outreach attempts will be made to reach the patient to complete the Transitions of Care (Post Inpatient/ED visit) call.   Signature Juanda Crumble, [place] Direct Dial [phone]

## 2022-10-02 ENCOUNTER — Telehealth: Payer: Self-pay

## 2022-10-02 DIAGNOSIS — I77819 Aortic ectasia, unspecified site: Secondary | ICD-10-CM

## 2022-10-02 DIAGNOSIS — I351 Nonrheumatic aortic (valve) insufficiency: Secondary | ICD-10-CM

## 2022-10-02 DIAGNOSIS — I517 Cardiomegaly: Secondary | ICD-10-CM

## 2022-10-02 NOTE — Telephone Encounter (Signed)
Order placed for Cardiac MRI due in April 2024.

## 2022-10-02 NOTE — Telephone Encounter (Signed)
-----   Message from Werner Lean, MD sent at 07/18/2022  9:04 AM EST ----- Results: Normal LV function Mild LV dilation Severe LVH Severe Aortic dilation on echo measurements (suspect overestimation give recent CT Mild to moderate AI Plan: OK for surgery April 2024- CMR (AI) and MRA  Werner Lean, MD

## 2022-10-02 NOTE — Transitions of Care (Post Inpatient/ED Visit) (Unsigned)
   10/02/2022  Name: Robert [name] MRN: [id number] DOB: May 05, 1971  Today's TOC FU Call Status: Today's TOC FU Call Status:: Unsuccessful Call (2nd Attempt) Unsuccessful Call (1st Attempt) Date: 10/01/22 Unsuccessful Call (2nd Attempt) Date: 10/02/22  Attempted to reach the patient regarding the most recent Inpatient/ED visit.  Follow Up Plan: Additional outreach attempts will be made to reach the patient to complete the Transitions of Care (Post Inpatient/ED visit) call.   Signature Juanda Crumble, [place] Direct Dial [phone]

## 2022-10-03 NOTE — Transitions of Care (Post Inpatient/ED Visit) (Signed)
   10/03/2022  Name: Robert [name] MRN: [id number] DOB: Sep 03, 1970  Today's TOC FU Call Status: Today's TOC FU Call Status:: Successful TOC FU Call Competed Unsuccessful Call (1st Attempt) Date: 10/01/22 Unsuccessful Call (2nd Attempt) Date: 10/02/22 [facility] FU Call Complete Date: 10/03/22  Transition Care Management Follow-up Telephone Call Date of Discharge: 09/30/22 [facility] Type of Discharge: Inpatient Admission Primary Inpatient Discharge Diagnosis:: acute fidney failure How have you been since you were released from the hospital?: Better Any questions or concerns?: No  Items Reviewed: Did you receive and understand the discharge instructions provided?: Yes Medications obtained and verified?: Yes (Medications Reviewed) Any new allergies since your discharge?: No Dietary orders reviewed?: NA Do you have support at home?: Yes People in Home: spouse  Home Care and Equipment/Supplies: [place] Ordered?: NA Any new equipment or medical supplies ordered?: NA  Functional Questionnaire: Do you need assistance with bathing/showering or dressing?: No Do you need assistance with meal preparation?: No Do you need assistance with eating?: No Do you have difficulty maintaining continence: No Do you need assistance with getting out of bed/getting out of a chair/moving?: No Do you have difficulty managing or taking your medications?: No  Folllow up appointments reviewed: PCP Follow-up appointment confirmed?: NA Specialist Hospital Follow-up appointment confirmed?: Yes Date of Specialist follow-up appointment?: 10/10/22 Follow-Up Specialty Provider:: Dr Luis Abed Do you need transportation to your follow-up appointment?: No Do you understand care options if your condition(s) worsen?: Yes-patient verbalized understanding    [place], [place] Nurse Health Advisor Direct Dial [phone]

## 2022-10-04 ENCOUNTER — Telehealth: Payer: BLUE CROSS/BLUE SHIELD | Admitting: Nurse Practitioner

## 2022-10-05 NOTE — Progress Notes (Signed)
Pharmacist Chemotherapy Monitoring - Initial Assessment    Anticipated start date: 10/12/22   The following has been reviewed per standard work regarding the patient's treatment regimen: The patient's diagnosis, treatment plan and drug doses, and organ/hematologic function Lab orders and baseline tests specific to treatment regimen  The treatment plan start date, drug sequencing, and pre-medications Prior authorization status  Patient's documented medication list, including drug-drug interaction screen and prescriptions for anti-emetics and supportive care specific to the treatment regimen The drug concentrations, fluid compatibility, administration routes, and timing of the medications to be used The patient's access for treatment and lifetime cumulative dose history, if applicable  The patient's medication allergies and previous infusion related reactions, if applicable   Changes made to treatment plan:  N/A  Follow up needed:  N/A   Judge Stall, [place], 10/05/2022  9:03 AM

## 2022-10-09 MED FILL — Dexamethasone Sodium Phosphate Inj 100 MG/10ML: INTRAMUSCULAR | Qty: 1 | Status: AC

## 2022-10-09 NOTE — Assessment & Plan Note (Signed)
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  Initial clinical staging T2N0M0.  -He underwent LAR and diverting ileostomy with Dr. Dema Severin on August 23, 2022.   -Case discussed in GI tumor board, we recommend adjuvant chemotherapy for 3 months, we feel the benefit of adjuvant radiation is probably small and okay to forgo if he gets chemo. -He was recently admitted for AKI on September 27, 2022, adjuvant chemotherapy was postponed. -He has recovered.  Will proceed for cycle CapeOx today.

## 2022-10-10 ENCOUNTER — Inpatient Hospital Stay (HOSPITAL_BASED_OUTPATIENT_CLINIC_OR_DEPARTMENT_OTHER): Payer: BLUE CROSS/BLUE SHIELD | Admitting: Hematology

## 2022-10-10 ENCOUNTER — Encounter: Payer: Self-pay | Admitting: Hematology

## 2022-10-10 ENCOUNTER — Inpatient Hospital Stay: Payer: BLUE CROSS/BLUE SHIELD

## 2022-10-10 ENCOUNTER — Other Ambulatory Visit (HOSPITAL_COMMUNITY): Payer: BLUE CROSS/BLUE SHIELD

## 2022-10-10 VITALS — BP 119/78 | HR 71 | Temp 98.6°F | Resp 18 | Ht 75.0 in | Wt 266.6 lb

## 2022-10-10 VITALS — BP 143/83 | HR 63 | Resp 18

## 2022-10-10 DIAGNOSIS — I2510 Atherosclerotic heart disease of native coronary artery without angina pectoris: Secondary | ICD-10-CM | POA: Diagnosis not present

## 2022-10-10 DIAGNOSIS — R197 Diarrhea, unspecified: Secondary | ICD-10-CM | POA: Diagnosis not present

## 2022-10-10 DIAGNOSIS — I10 Essential (primary) hypertension: Secondary | ICD-10-CM | POA: Diagnosis not present

## 2022-10-10 DIAGNOSIS — K219 Gastro-esophageal reflux disease without esophagitis: Secondary | ICD-10-CM | POA: Diagnosis not present

## 2022-10-10 DIAGNOSIS — Z5111 Encounter for antineoplastic chemotherapy: Secondary | ICD-10-CM | POA: Diagnosis present

## 2022-10-10 DIAGNOSIS — Z87891 Personal history of nicotine dependence: Secondary | ICD-10-CM | POA: Diagnosis not present

## 2022-10-10 DIAGNOSIS — C20 Malignant neoplasm of rectum: Secondary | ICD-10-CM

## 2022-10-10 DIAGNOSIS — D509 Iron deficiency anemia, unspecified: Secondary | ICD-10-CM | POA: Diagnosis not present

## 2022-10-10 DIAGNOSIS — Z95828 Presence of other vascular implants and grafts: Secondary | ICD-10-CM | POA: Insufficient documentation

## 2022-10-10 LAB — CBC WITH DIFFERENTIAL (CANCER CENTER ONLY)
Abs Immature Granulocytes: 0.03 10*3/uL (ref 0.00–0.07)
Basophils Absolute: 0 10*3/uL (ref 0.0–0.1)
Basophils Relative: 1 %
Eosinophils Absolute: 0.3 10*3/uL (ref 0.0–0.5)
Eosinophils Relative: 6 %
HCT: 36.6 % — ABNORMAL LOW (ref 39.0–52.0)
Hemoglobin: 11.8 g/dL — ABNORMAL LOW (ref 13.0–17.0)
Immature Granulocytes: 1 %
Lymphocytes Relative: 27 %
Lymphs Abs: 1.5 10*3/uL (ref 0.7–4.0)
MCH: 25.2 pg — ABNORMAL LOW (ref 26.0–34.0)
MCHC: 32.2 g/dL (ref 30.0–36.0)
MCV: 78 fL — ABNORMAL LOW (ref 80.0–100.0)
Monocytes Absolute: 0.5 10*3/uL (ref 0.1–1.0)
Monocytes Relative: 9 %
Neutro Abs: 3.2 10*3/uL (ref 1.7–7.7)
Neutrophils Relative %: 56 %
Platelet Count: 199 10*3/uL (ref 150–400)
RBC: 4.69 MIL/uL (ref 4.22–5.81)
RDW: 17.2 % — ABNORMAL HIGH (ref 11.5–15.5)
WBC Count: 5.5 10*3/uL (ref 4.0–10.5)
nRBC: 0 % (ref 0.0–0.2)

## 2022-10-10 LAB — CMP (CANCER CENTER ONLY)
ALT: 19 U/L (ref 0–44)
AST: 16 U/L (ref 15–41)
Albumin: 4.1 g/dL (ref 3.5–5.0)
Alkaline Phosphatase: 58 U/L (ref 38–126)
Anion gap: 6 (ref 5–15)
BUN: 18 mg/dL (ref 6–20)
CO2: 25 mmol/L (ref 22–32)
Calcium: 8.8 mg/dL — ABNORMAL LOW (ref 8.9–10.3)
Chloride: 106 mmol/L (ref 98–111)
Creatinine: 1.32 mg/dL — ABNORMAL HIGH (ref 0.61–1.24)
GFR, Estimated: >60 mL/min (ref >60)
Glucose, Bld: 174 mg/dL — ABNORMAL HIGH (ref 70–99)
Potassium: 3.9 mmol/L (ref 3.5–5.1)
Sodium: 137 mmol/L (ref 135–145)
Total Bilirubin: 0.4 mg/dL (ref 0.3–1.2)
Total Protein: 6.3 g/dL — ABNORMAL LOW (ref 6.5–8.1)

## 2022-10-10 MED ORDER — SODIUM CHLORIDE 0.9% FLUSH
10.0000 mL | Freq: Once | INTRAVENOUS | Status: AC
  Administered 2022-10-10: 10 mL

## 2022-10-10 MED ORDER — FLUOROURACIL CHEMO INJECTION 2.5 GM/50ML
400.0000 mg/m2 | Freq: Once | INTRAVENOUS | Status: AC
  Administered 2022-10-10: 1000 mg via INTRAVENOUS
  Filled 2022-10-10: qty 20

## 2022-10-10 MED ORDER — LEUCOVORIN CALCIUM INJECTION 350 MG
400.0000 mg/m2 | Freq: Once | INTRAVENOUS | Status: AC
  Administered 2022-10-10: 1016 mg via INTRAVENOUS
  Filled 2022-10-10: qty 50.8

## 2022-10-10 MED ORDER — DEXTROSE 5 % IV SOLN: Freq: Once | INTRAVENOUS | Status: AC

## 2022-10-10 MED ORDER — SODIUM CHLORIDE 0.9 % IV SOLN
10.0000 mg | Freq: Once | INTRAVENOUS | Status: AC
  Administered 2022-10-10: 10 mg via INTRAVENOUS
  Filled 2022-10-10: qty 10

## 2022-10-10 MED ORDER — OXALIPLATIN CHEMO INJECTION 100 MG/20ML
85.0000 mg/m2 | Freq: Once | INTRAVENOUS | Status: AC
  Administered 2022-10-10: 200 mg via INTRAVENOUS
  Filled 2022-10-10: qty 40

## 2022-10-10 MED ORDER — SODIUM CHLORIDE 0.9 % IV SOLN
2400.0000 mg/m2 | INTRAVENOUS | Status: DC
  Administered 2022-10-10: 6100 mg via INTRAVENOUS
  Filled 2022-10-10: qty 122

## 2022-10-10 MED ORDER — PALONOSETRON HCL INJECTION 0.25 MG/5ML
0.2500 mg | Freq: Once | INTRAVENOUS | Status: AC
  Administered 2022-10-10: 0.25 mg via INTRAVENOUS
  Filled 2022-10-10: qty 5

## 2022-10-10 NOTE — Progress Notes (Signed)
[place]   Telephone:([phone]) [phone] Fax:(336) [phone]   Clinic Follow up Note   Patient Care Team: [name], MD as PCP - General (Family Medicine) Werner Lean, MD as PCP - Cardiology (Cardiology) Truitt Merle, MD as Consulting Physician (Oncology) Alla Feeling, NP as Nurse Practitioner (Oncology) Royston Bake, RN as Oncology Nurse Navigator (Oncology)  Date of Service:  10/10/2022  CHIEF COMPLAINT: f/u of rectal cancer  CURRENT THERAPY:  FOLFOX every 2 weeks, starting today  ASSESSMENT:  Robert [name] is a 52 y.o. male with   Rectal cancer ([place]) -Stage II, pT3N0M0, MSS -diagnosed in September 2023.  Initial clinical staging T2N0M0.  -He underwent LAR and diverting ileostomy with Dr. Dema Severin on August 23, 2022.   -Case discussed in GI tumor board, we recommend adjuvant chemotherapy for 3 months, we feel the benefit of adjuvant radiation is probably small and okay to forgo if he gets chemo. -He was recently admitted for AKI on September 27, 2022, adjuvant chemotherapy was postponed. -He has recovered.  Will proceed for cycle FOLFOX today.  I again reviewed the potential side effect from chemo, and the management.  Diarrhea and recent history of AKI -Secondary to diarrhea from ileostomy bag, he required a hospital stay. -Improved overall, he will continue use Imodium as needed and watch his diet.   PLAN: -Lab reviewed, adequate for treatment, will proceed for cycle FOLFOX today -Follow-up in 2 weeks before cycle 2   SUMMARY OF ONCOLOGIC HISTORY: Oncology History  Rectal cancer ([place])  05/09/2022 Procedure   Colonoscopy by Dr. Fuller Plan, impression - Five 5 to 7 mm polyps in the rectum, in the transverse colon and at the hepatic flexure, removed with a cold snare. Resected and retrieved. removed with a cold snare. Resected and retrieved. 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 Submucosal injection tattoo. - Internal hemorrhoids. -  The examination was otherwise normal on direct and retroflexion views.   05/09/2022 Initial Biopsy   Diagnosis 1. Surgical [P], colon, transverse x1 and hepatic flexure x 3 (2 pieces only, 2 stuck together taken off together), polyp (4) - TUBULAR ADENOMA(S) - NEGATIVE FOR HIGH-GRADE DYSPLASIA OR MALIGNANCY 2. Surgical [P], colon, rectal mass - INVASIVE MODERATELY DIFFERENTIATED ADENOCARCINOMA. SEE NOTE 3. Surgical [P], colon, rectum, polyp (1) - HYPERPLASTIC POLYP   05/09/2022 Tumor Marker   CEA < 2.0   05/17/2022 Imaging   IMPRESSION: 1. Known primary malignancy is not well visualized. Correlate with recent colonoscopy. 2. No evidence of metastatic disease in the chest, abdomen or pelvis. 3. Mildly enlarged bilateral inguinal lymph nodes, likely reactive. 4. 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 Recommend annual imaging followup by CTA or MRA. This recommendation follows 2010 ACCF/AHA/AATS/ACR/ASA/SCA/SCAI/SIR/STS/SVM Guidelines for the Diagnosis and Management of Patients with Thoracic Aortic Disease. Circulation. 2010; [id number]. 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 Severe coronary artery calcifications of the LAD and circumflex. 6. Aortic Atherosclerosis (ICD10-I70.0).   05/23/2022 Initial Diagnosis   Rectal cancer ([place])   08/23/2022 Cancer Staging   Staging form: Colon and Rectum, AJCC 8th Edition - Pathologic stage from 08/23/2022: Stage IIA (pT3, pN0, cM0) - Signed by Alla Feeling, NP on 09/10/2022 Stage prefix: Initial diagnosis Total positive nodes: 0   08/23/2022 Surgery   PROCEDURE:  Robotic assisted low anterior resection with diverting loop ileostomy SURGEON: Sharon Mt. Dema Severin, MD ASSISTANT: Leighton Ruff, MD   [id number] Pathology Results   FINAL MICROSCOPIC DIAGNOSIS:  A. COLON, RECTOSIGMOID, RESECTION:  -  Invasive moderately differentiated adenocarcinoma, 2.5 cm, involving rectum  - Carcinoma invades focally into the perirectal  adipose tissue  - Resection margins are negative for carcinoma  - Negative for lymphovascular invasion  - Fourteen lymph nodes, negative for carcinoma (0/14)  - See oncology table  B. DISTAL MARGIN DONUT:  - Rectal donut, negative for carcinoma   Regional Lymph Nodes:       Number of Lymph Nodes with Tumor: 0       Number of Lymph Nodes Examined: 14  Pathologic Stage Classification (pTNM, AJCC 8th Edition): pT3, pN0  Mismatch Repair Protein (IHC)  SUMMARY INTERPRETATION: NORMAL  MSI-Stable   09/27/2022 - 09/27/2022 Chemotherapy   Patient is on Treatment Plan : RECTAL Xelox ([place]) (130/850) q21d x 6 cycles     10/10/2022 -  Chemotherapy   Patient is on Treatment Plan : COLORECTAL FOLFOX q14d x 3 months        INTERVAL HISTORY:  Yochanon Bracknell is here for a follow up of rectal cancer. He was last seen by NP Lacie on 09/13/2022. He presents to the clinic accompanied by his wife.  He was hospitalized for severe diarrhea and AKI on September 27, 2022, his renal function recovered after hydration.  He is diarrhea has much improved overall, he is on fiber supplement, and use Imodium as needed.  No other complaints.  All other systems were reviewed with the patient and are negative.  MEDICAL HISTORY:  Past Medical History:  Diagnosis Date   Arthralgia 01/11/2013   Diffuse, b/l Hips R>L Knees, ankles, low back   Arthritis    Back pain 12/08/2015   Cancer ([place])    Coronary artery disease    Diabetes mellitus type 2 in obese ([place]) 11/09/2012   Dyslipidemia 11/09/2012   Esophageal reflux 12/08/2015   Feeling grief 06/09/2020   History of migraine headaches    Hyperglycemia 11/09/2012   Hypertension     SURGICAL HISTORY: Past Surgical History:  Procedure Laterality Date   DIVERTING ILEOSTOMY N/A 08/23/2022   Procedure: DIVERTING LOOP ILEOSTOMY;  Surgeon: Ileana Roup, MD;  Location: [facility];  Service: General;  Laterality: N/A;   EUS N/A 06/21/2022   Procedure: LOWER  ENDOSCOPIC ULTRASOUND (EUS);  Surgeon: Irving Copas., MD;  Location: [facility];  Service: Gastroenterology;  Laterality: N/A;   FLEXIBLE SIGMOIDOSCOPY N/A 06/21/2022   Procedure: FLEXIBLE SIGMOIDOSCOPY;  Surgeon: Rush Landmark Telford Nab., MD;  Location: [facility];  Service: Gastroenterology;  Laterality: N/A;   FLEXIBLE SIGMOIDOSCOPY N/A 08/23/2022   Procedure: FLEXIBLE SIGMOIDOSCOPY;  Surgeon: Ileana Roup, MD;  Location: [facility];  Service: General;  Laterality: N/A;   PORTACATH PLACEMENT N/A 09/21/2022   Procedure: INSERTION PORT-A-CATH WITH ULTRASOUND GUIDANCE;  Surgeon: Ileana Roup, MD;  Location: [facility];  Service: General;  Laterality: N/A;   XI ROBOTIC ASSISTED LOWER ANTERIOR RESECTION N/A 08/23/2022   Procedure: XI ROBOTIC ASSISTED LOWER ANTERIOR RESECTION with Introperative assessment of profusion and TAP Block;  Surgeon: Ileana Roup, MD;  Location: [facility];  Service: General;  Laterality: N/A;    I have reviewed the social history and family history with the patient and they are unchanged from previous note.  ALLERGIES:  is allergic to mucinex [guaifenesin er], nsaids, and latex.  MEDICATIONS:  Current Outpatient Medications  Medication Sig Dispense Refill   capecitabine (XELODA) 500 MG tablet TAKE 4 TABLETS BY MOUTH 2 TIMES A DAY AFTER A MEAL ON DAYS 1 TO 14 OF A 21 DAY CYCLE. [place]  tablet 0   carvedilol (COREG) 25 MG tablet Take 1 tablet (25 mg total) by mouth 2 (two) times daily. 180 tablet 3   esomeprazole (NEXIUM) 20 MG capsule Take 20 mg by mouth daily.     famotidine (PEPCID) 20 MG tablet Take 1 tablet (20 mg total) by mouth 2 (two) times daily as needed for heartburn or indigestion. 60 tablet 0   glucose blood (COOL BLOOD GLUCOSE TEST STRIPS) test strip Check blood sugar once daily 100 each 12   Iron, Ferrous Sulfate, 325 (65 Fe) MG TABS Take 325 mg by mouth 2 (two) times daily with a meal. (Patient not taking: Reported on 09/27/2022) 30 tablet  2   loperamide (IMODIUM) 2 MG capsule Take 1 capsule (2 mg total) by mouth 2 (two) times daily. 30 capsule 0   metFORMIN (GLUCOPHAGE) 500 MG tablet Take 1 tablet (500 mg total) by mouth daily with breakfast. (Patient taking differently: Take 500 mg by mouth every evening.) 90 tablet 1   ondansetron (ZOFRAN) 4 MG tablet Take 1 tablet (4 mg total) by mouth every 6 (six) hours as needed for nausea. 20 tablet 0   oxyCODONE (OXY IR/ROXICODONE) 5 MG immediate release tablet Take 1 tablet (5 mg total) by mouth every 4 (four) hours as needed for moderate pain. 10 tablet 0   pantoprazole (PROTONIX) 40 MG tablet Take 1 tablet (40 mg total) by mouth daily. 30 tablet 0   sitaGLIPtin (JANUVIA) 100 MG tablet Take 1 tablet (100 mg total) by mouth daily. 30 tablet 4   tiZANidine (ZANAFLEX) 2 MG tablet Take 0.5-2 tablets (1-4 mg total) by mouth 2 (two) times daily as needed for muscle spasms. (Patient taking differently: Take 2-4 mg by mouth 3 (three) times daily as needed for muscle spasms.) 30 tablet 5   traZODone (DESYREL) 50 MG tablet Take 0.5 tablets (25 mg total) by mouth at bedtime as needed for sleep. 30 tablet 0   No current facility-administered medications for this visit.    PHYSICAL EXAMINATION: ECOG PERFORMANCE STATUS: 1 - Symptomatic but completely ambulatory  Vitals:   10/10/22 0937  BP: 119/78  Pulse: 71  Resp: 18  Temp: 98.6 F (37 C)  SpO2: 97%   Wt Readings from Last 3 Encounters:  10/10/22 266 lb 9.6 oz (120.9 kg)  09/29/22 261 lb (118.4 kg)  09/21/22 268 lb (121.6 kg)     GENERAL:alert, no distress and comfortable SKIN: skin color, texture, turgor are normal, no rashes or significant lesions EYES: normal, Conjunctiva are pink and non-injected, sclera clear NECK: supple, thyroid normal size, non-tender, without nodularity LYMPH:  no palpable lymphadenopathy in the cervical, axillary  LUNGS: clear to auscultation and percussion with normal breathing effort HEART: regular rate &  rhythm and no murmurs and no lower extremity edema ABDOMEN:abdomen soft, non-tender and normal bowel sounds, (+) ostomy bag Musculoskeletal:no cyanosis of digits and no clubbing  NEURO: alert & oriented x 3 with fluent speech, no focal motor/sensory deficits  LABORATORY DATA:  I have reviewed the data as listed    Latest Ref Rng & Units 10/10/2022    9:05 AM 09/30/2022    5:58 AM 09/29/2022    7:35 AM  CBC  WBC 4.0 - 10.5 K/uL 5.5  6.2  5.4   Hemoglobin 13.0 - 17.0 g/dL 11.8  10.8  11.8   Hematocrit 39.0 - 52.0 % 36.6  35.0  37.5   Platelets 150 - 400 K/uL 199  169  168  Latest Ref Rng & Units 09/30/2022    5:58 AM 09/29/2022    7:35 AM 09/28/2022    6:17 AM  CMP  Glucose 70 - 99 mg/dL 107  124  130   BUN 6 - 20 mg/dL 17  23  35   Creatinine 0.61 - 1.24 mg/dL 1.23  1.38  1.89   Sodium 135 - 145 mmol/L 137  137  134   Potassium 3.5 - 5.1 mmol/L 4.0  4.7  3.9   Chloride 98 - 111 mmol/L 111  107  99   CO2 22 - 32 mmol/L '21  24  25   '$ Calcium 8.9 - 10.3 mg/dL 8.6  8.7  8.6   Total Protein 6.5 - 8.1 g/dL 6.1  6.0    Total Bilirubin 0.3 - 1.2 mg/dL 0.5  0.4    Alkaline Phos 38 - 126 U/L 54  56    AST 15 - 41 U/L 17  17    ALT 0 - 44 U/L 16  19        RADIOGRAPHIC STUDIES: I have personally reviewed the radiological images as listed and agreed with the findings in the report. No results found.    No orders of the defined types were placed in this encounter.  All questions were answered. The patient knows to call the clinic with any problems, questions or concerns. No barriers to learning was detected. The total time spent in the appointment was 30 minutes.     Truitt Merle, MD 10/10/2022

## 2022-10-10 NOTE — Patient Instructions (Signed)
[facility]  Discharge Instructions: Thank you for choosing [facility] to provide your oncology and hematology care.   If you have a lab appointment with the [facility], please go directly to the [facility] and check in at the registration area.   Wear comfortable clothing and clothing appropriate for easy access to any Portacath or PICC line.   We strive to give you quality time with your provider. You may need to reschedule your appointment if you arrive late (15 or more minutes).  Arriving late affects you and other patients whose appointments are after yours.  Also, if you miss three or more appointments without notifying the office, you may be dismissed from the clinic at the provider's discretion.      For prescription refill requests, have your pharmacy contact our office and allow 72 hours for refills to be completed.    Today you received the following chemotherapy and/or immunotherapy agents leucovorin, fluorourcil, oxaliplatin      To help prevent nausea and vomiting after your treatment, we encourage you to take your nausea medication as directed.  BELOW ARE SYMPTOMS THAT SHOULD BE REPORTED IMMEDIATELY: *FEVER GREATER THAN 100.4 F (38 C) OR HIGHER *CHILLS OR SWEATING *NAUSEA AND VOMITING THAT IS NOT CONTROLLED WITH YOUR NAUSEA MEDICATION *UNUSUAL SHORTNESS OF BREATH *UNUSUAL BRUISING OR BLEEDING *URINARY PROBLEMS (pain or burning when urinating, or frequent urination) *BOWEL PROBLEMS (unusual diarrhea, constipation, pain near the anus) TENDERNESS IN MOUTH AND THROAT WITH OR WITHOUT PRESENCE OF ULCERS (sore throat, sores in mouth, or a toothache) UNUSUAL RASH, SWELLING OR PAIN  UNUSUAL VAGINAL DISCHARGE OR ITCHING   Items with * indicate a potential emergency and should be followed up as soon as possible or go to the Emergency Department if any problems should occur.  Please show the CHEMOTHERAPY ALERT CARD or  IMMUNOTHERAPY ALERT CARD at check-in to the Emergency Department and triage nurse.  Should you have questions after your visit or need to cancel or reschedule your appointment, please contact [facility]  Dept: [phone]  and follow the prompts.  Office hours are 8:00 a.m. to 4:30 p.m. Monday - Friday. Please note that voicemails left after 4:00 p.m. may not be returned until the following business day.  We are closed weekends and major holidays. You have access to a nurse at all times for urgent questions. Please call the main number to the clinic Dept: [phone] and follow the prompts.   For any non-urgent questions, you may also contact your provider using MyChart. We now offer e-Visits for anyone 28 and older to request care online for non-urgent symptoms. For details visit [email or web address].   Also download the MyChart app! Go to the app store, search "MyChart", open the app, select [facility], and log in with your MyChart username and password.

## 2022-10-11 ENCOUNTER — Telehealth: Payer: Self-pay

## 2022-10-11 NOTE — Telephone Encounter (Signed)
-----   Message from Gillian Shields, RN sent at 10/10/2022  3:29 PM EST ----- Regarding: FT Folfox Feng First time chemo- folfox- Dr. Burr Medico. Able to complete treatment without incident.  Have a great day :-)

## 2022-10-11 NOTE — Telephone Encounter (Signed)
Mr Rough states that he feels fatigued. He had some nausea this am and took a compazine tablet with good effect.  The cold sensitivity is present. Managing this symptom well. He is eating, drinking, and urinating well. He knows to call the office at [phone] if he has any questions or concerns.

## 2022-10-12 ENCOUNTER — Inpatient Hospital Stay: Payer: BLUE CROSS/BLUE SHIELD | Attending: Nurse Practitioner

## 2022-10-12 ENCOUNTER — Other Ambulatory Visit: Payer: Self-pay

## 2022-10-12 ENCOUNTER — Ambulatory Visit: Payer: BLUE CROSS/BLUE SHIELD

## 2022-10-12 ENCOUNTER — Ambulatory Visit: Payer: BLUE CROSS/BLUE SHIELD | Admitting: Hematology

## 2022-10-12 ENCOUNTER — Other Ambulatory Visit: Payer: BLUE CROSS/BLUE SHIELD

## 2022-10-12 VITALS — BP 134/77 | HR 63 | Temp 98.6°F | Resp 18

## 2022-10-12 DIAGNOSIS — D630 Anemia in neoplastic disease: Secondary | ICD-10-CM | POA: Insufficient documentation

## 2022-10-12 DIAGNOSIS — C20 Malignant neoplasm of rectum: Secondary | ICD-10-CM | POA: Diagnosis present

## 2022-10-12 DIAGNOSIS — Z452 Encounter for adjustment and management of vascular access device: Secondary | ICD-10-CM | POA: Diagnosis not present

## 2022-10-12 DIAGNOSIS — I77819 Aortic ectasia, unspecified site: Secondary | ICD-10-CM

## 2022-10-12 DIAGNOSIS — Z5111 Encounter for antineoplastic chemotherapy: Secondary | ICD-10-CM | POA: Insufficient documentation

## 2022-10-12 MED ORDER — SODIUM CHLORIDE 0.9% FLUSH
10.0000 mL | INTRAVENOUS | Status: DC | PRN
  Administered 2022-10-12: 10 mL

## 2022-10-12 MED ORDER — HEPARIN SOD (PORK) LOCK FLUSH 100 UNIT/ML IV SOLN
500.0000 [IU] | Freq: Once | INTRAVENOUS | Status: AC | PRN
  Administered 2022-10-12: 500 [IU]

## 2022-10-12 NOTE — Telephone Encounter (Signed)
Left a message for pt to call back to review instructions for Cardiac MRI/MRA.  Advised I will place instructions on my chart for review.  However I still would like for pt to call so I can verbally go over instructions and answer any questions.

## 2022-10-16 ENCOUNTER — Encounter: Payer: Self-pay | Admitting: Hematology

## 2022-10-16 ENCOUNTER — Encounter: Payer: Self-pay | Admitting: Family Medicine

## 2022-10-16 MED ORDER — PANTOPRAZOLE SODIUM 40 MG PO TBEC: 40.0000 mg | DELAYED_RELEASE_TABLET | Freq: Every day | ORAL | 0 refills | Status: DC

## 2022-10-19 ENCOUNTER — Telehealth (HOSPITAL_COMMUNITY): Payer: Self-pay | Admitting: *Deleted

## 2022-10-19 NOTE — Telephone Encounter (Signed)
Reaching out to patient to offer assistance regarding upcoming cardiac imaging study; pt verbalizes understanding of appt date/time, parking situation and where to check in, and verified current allergies; name and call back number provided for further questions should they arise  Gordy Clement RN Navigator Cardiac Imaging [facility] [phone] office [phone] cell  Patient has had MRIs in the past without incident.

## 2022-10-22 ENCOUNTER — Ambulatory Visit (HOSPITAL_COMMUNITY)
Admission: RE | Admit: 2022-10-22 | Discharge: 2022-10-22 | Disposition: A | Discharge status: Home / Self Care | Payer: BLUE CROSS/BLUE SHIELD | Source: Ambulatory Visit | Attending: Internal Medicine | Admitting: Internal Medicine

## 2022-10-22 ENCOUNTER — Other Ambulatory Visit: Payer: Self-pay | Admitting: Internal Medicine

## 2022-10-22 DIAGNOSIS — I351 Nonrheumatic aortic (valve) insufficiency: Secondary | ICD-10-CM | POA: Insufficient documentation

## 2022-10-22 DIAGNOSIS — I517 Cardiomegaly: Secondary | ICD-10-CM

## 2022-10-22 DIAGNOSIS — I77819 Aortic ectasia, unspecified site: Secondary | ICD-10-CM | POA: Insufficient documentation

## 2022-10-22 MED ORDER — GADOBUTROL 1 MMOL/ML IV SOLN
10.0000 mL | Freq: Once | INTRAVENOUS | Status: AC | PRN
  Administered 2022-10-22: 10 mL via INTRAVENOUS

## 2022-10-23 ENCOUNTER — Telehealth: Payer: Self-pay | Admitting: Internal Medicine

## 2022-10-23 MED FILL — Dexamethasone Sodium Phosphate Inj 100 MG/10ML: INTRAMUSCULAR | Qty: 1 | Status: AC

## 2022-10-23 NOTE — Progress Notes (Unsigned)
Patient Care Team: [name], MD as PCP - General (Family Medicine) Werner Lean, MD as PCP - Cardiology (Cardiology) Truitt Merle, MD as Consulting Physician (Oncology) Alla Feeling, NP as Nurse Practitioner (Oncology) Royston Bake, RN as Oncology Nurse Navigator (Oncology)   CHIEF COMPLAINT: Follow-up rectal cancer  Oncology History  Rectal cancer [facility])  05/09/2022 Procedure   Colonoscopy by Dr. Fuller Plan, impression - Five 5 to 7 mm polyps in the rectum, in the transverse colon and at the hepatic flexure, removed with a cold snare. Resected and retrieved. removed with a cold snare. Resected and retrieved. 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 Submucosal injection tattoo. - Internal hemorrhoids. - The examination was otherwise normal on direct and retroflexion views.   05/09/2022 Initial Biopsy   Diagnosis 1. Surgical [P], colon, transverse x1 and hepatic flexure x 3 (2 pieces only, 2 stuck together taken off together), polyp (4) - TUBULAR ADENOMA(S) - NEGATIVE FOR HIGH-GRADE DYSPLASIA OR MALIGNANCY 2. Surgical [P], colon, rectal mass - INVASIVE MODERATELY DIFFERENTIATED ADENOCARCINOMA. SEE NOTE 3. Surgical [P], colon, rectum, polyp (1) - HYPERPLASTIC POLYP   05/09/2022 Tumor Marker   CEA < 2.0   05/17/2022 Imaging   IMPRESSION: 1. Known primary malignancy is not well visualized. Correlate with recent colonoscopy. 2. No evidence of metastatic disease in the chest, abdomen or pelvis. 3. Mildly enlarged bilateral inguinal lymph nodes, likely reactive. 4. 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 Recommend annual imaging followup by CTA or MRA. This recommendation follows 2010 ACCF/AHA/AATS/ACR/ASA/SCA/SCAI/SIR/STS/SVM Guidelines for the Diagnosis and Management of Patients with Thoracic Aortic Disease. Circulation. 2010; [id number]. 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 Severe coronary artery calcifications of the LAD and  circumflex. 6. Aortic Atherosclerosis (ICD10-I70.0).   05/23/2022 Initial Diagnosis   Rectal cancer ([name])   08/23/2022 Cancer Staging   Staging form: Colon and Rectum, AJCC 8th Edition - Pathologic stage from 08/23/2022: Stage IIA (pT3, pN0, cM0) - Signed by Alla Feeling, NP on 09/10/2022 Stage prefix: Initial diagnosis Total positive nodes: 0   08/23/2022 Surgery   PROCEDURE:  Robotic assisted low anterior resection with diverting loop ileostomy SURGEON: Sharon Mt. Dema Severin, MD ASSISTANT: Leighton Ruff, MD   [id number] Pathology Results   FINAL MICROSCOPIC DIAGNOSIS:  A. COLON, RECTOSIGMOID, RESECTION:  - Invasive moderately differentiated adenocarcinoma, 2.5 cm, involving rectum  - Carcinoma invades focally into the perirectal adipose tissue  - Resection margins are negative for carcinoma  - Negative for lymphovascular invasion  - Fourteen lymph nodes, negative for carcinoma (0/14)  - See oncology table  B. DISTAL MARGIN DONUT:  - Rectal donut, negative for carcinoma   Regional Lymph Nodes:       Number of Lymph Nodes with Tumor: 0       Number of Lymph Nodes Examined: 14  Pathologic Stage Classification (pTNM, AJCC 8th Edition): pT3, pN0  Mismatch Repair Protein (IHC)  SUMMARY INTERPRETATION: NORMAL  MSI-Stable   09/27/2022 - 09/27/2022 Chemotherapy   Patient is on Treatment Plan : RECTAL Xelox (Capeox) (130/850) q21d x 6 cycles     10/10/2022 -  Chemotherapy   Patient is on Treatment Plan : COLORECTAL FOLFOX q14d x 3 months        CURRENT THERAPY: Adjuvant chemo x 3 months, FOLFOX q2 weeks, starting 10/10/22  INTERVAL HISTORY Mr. Newmyer returns for follow-up and treatment as scheduled, last seen by Dr. Burr Medico 10/10/2022 and began adjuvant FOLFOX.  Side effects were mild in general, the  worst was cold sensitivity which lasted 3-4 days, then improved.  He still has some aching/stiff feeling in his toes when feet are cold without numbness or tingling.  He had mild fatigue,  but able to be out of bed, up at home, and active.  He had 2 episodes of lightheadedness, one while walking on a slope on a patterned carpet at church, and another when bending over to standing.  Denies fall.  He feels that he is able to eat and remain hydrated, although taste is altered.  He has cut back alcohol, caffeine, and juice.  Stools are liquid in the morning with a "purge" then thickened throughout the day.  He empties bag 6 times per day which is his preference, not because the bag is full.  Mild nausea was relieved with medication or eating something, no vomiting.  ROS  All other systems reviewed and negative  Past Medical History:  Diagnosis Date   Arthralgia 01/11/2013   Diffuse, b/l Hips R>L Knees, ankles, low back   Arthritis    Back pain 12/08/2015   Cancer ([place])    Coronary artery disease    Diabetes mellitus type 2 in obese ([place]) 11/09/2012   Dyslipidemia 11/09/2012   Esophageal reflux 12/08/2015   Feeling grief 06/09/2020   History of migraine headaches    Hyperglycemia 11/09/2012   Hypertension      Past Surgical History:  Procedure Laterality Date   DIVERTING ILEOSTOMY N/A 08/23/2022   Procedure: DIVERTING LOOP ILEOSTOMY;  Surgeon: Ileana Roup, MD;  Location: [facility];  Service: General;  Laterality: N/A;   EUS N/A 06/21/2022   Procedure: LOWER ENDOSCOPIC ULTRASOUND (EUS);  Surgeon: Irving Copas., MD;  Location: [facility];  Service: Gastroenterology;  Laterality: N/A;   FLEXIBLE SIGMOIDOSCOPY N/A 06/21/2022   Procedure: FLEXIBLE SIGMOIDOSCOPY;  Surgeon: Rush Landmark Telford Nab., MD;  Location: [facility];  Service: Gastroenterology;  Laterality: N/A;   FLEXIBLE SIGMOIDOSCOPY N/A 08/23/2022   Procedure: FLEXIBLE SIGMOIDOSCOPY;  Surgeon: Ileana Roup, MD;  Location: [facility];  Service: General;  Laterality: N/A;   PORTACATH PLACEMENT N/A 09/21/2022   Procedure: INSERTION PORT-A-CATH WITH ULTRASOUND GUIDANCE;  Surgeon: Ileana Roup,  MD;  Location: [facility];  Service: General;  Laterality: N/A;   XI ROBOTIC ASSISTED LOWER ANTERIOR RESECTION N/A 08/23/2022   Procedure: XI ROBOTIC ASSISTED LOWER ANTERIOR RESECTION with Introperative assessment of profusion and TAP Block;  Surgeon: Ileana Roup, MD;  Location: [facility];  Service: General;  Laterality: N/A;     Outpatient Encounter Medications as of 10/24/2022  Medication Sig Note   carvedilol (COREG) 25 MG tablet Take 1 tablet (25 mg total) by mouth 2 (two) times daily.    glucose blood (COOL BLOOD GLUCOSE TEST STRIPS) test strip Check blood sugar once daily    loperamide (IMODIUM) 2 MG capsule Take 1 capsule (2 mg total) by mouth 2 (two) times daily.    metFORMIN (GLUCOPHAGE) 500 MG tablet Take 1 tablet (500 mg total) by mouth daily with breakfast. (Patient taking differently: Take 500 mg by mouth every evening.)    ondansetron (ZOFRAN) 4 MG tablet Take 1 tablet (4 mg total) by mouth every 6 (six) hours as needed for nausea.    pantoprazole (PROTONIX) 40 MG tablet Take 1 tablet (40 mg total) by mouth daily.    sitaGLIPtin (JANUVIA) 100 MG tablet Take 1 tablet (100 mg total) by mouth daily.    tiZANidine (ZANAFLEX) 2 MG tablet Take 0.5-2 tablets (1-4 mg total)  by mouth 2 (two) times daily as needed for muscle spasms. (Patient taking differently: Take 2-4 mg by mouth 3 (three) times daily as needed for muscle spasms.)    [DISCONTINUED] capecitabine (XELODA) 500 MG tablet TAKE 4 TABLETS BY MOUTH 2 TIMES A DAY AFTER A MEAL ON DAYS 1 TO 14 OF A 21 DAY CYCLE. 09/27/2022: Pt reports that he has this medication at home.  He has never taken it before.  He is due to start his first tomorrow 2.16.2024.   [DISCONTINUED] famotidine (PEPCID) 20 MG tablet Take 1 tablet (20 mg total) by mouth 2 (two) times daily as needed for heartburn or indigestion.    [DISCONTINUED] Iron, Ferrous Sulfate, 325 (65 Fe) MG TABS Take 325 mg by mouth 2 (two) times daily with a meal. (Patient not taking: Reported  on 09/27/2022)    [DISCONTINUED] oxyCODONE (OXY IR/ROXICODONE) 5 MG immediate release tablet Take 1 tablet (5 mg total) by mouth every 4 (four) hours as needed for moderate pain.    [DISCONTINUED] traZODone (DESYREL) 50 MG tablet Take 0.5 tablets (25 mg total) by mouth at bedtime as needed for sleep.    No facility-administered encounter medications on file as of 10/24/2022.     Today's Vitals   10/24/22 0937  BP: 131/84  Pulse: 78  Resp: 16  Temp: 98.2 F (36.8 C)  TempSrc: Oral  SpO2: 96%  Weight: 263 lb (119.3 kg)  Height: '6\' 3"'$  (1.905 m)  PainSc: 0-No pain   Body mass index is 32.87 kg/m.   PHYSICAL EXAM GENERAL:alert, no distress and comfortable SKIN: no rash  EYES: sclera clear NECK: without mass LUNGS: clear with normal breathing effort HEART: regular rate & rhythm, no lower extremity edema ABDOMEN: abdomen soft, non-tender and normal bowel sounds NEURO: alert & oriented x 3 with fluent speech, no focal motor/sensory deficits PAC without erythema, ?retained stitch  CBC    Component Value Date/Time   WBC 6.0 10/24/2022 0913   WBC 6.2 09/30/2022 0558   RBC 4.78 10/24/2022 0913   HGB 12.2 (L) 10/24/2022 0913   HCT 36.5 (L) 10/24/2022 0913   PLT 156 10/24/2022 0913   MCV 76.4 (L) 10/24/2022 0913   MCH 25.5 (L) 10/24/2022 0913   MCHC 33.4 10/24/2022 0913   RDW 17.3 (H) 10/24/2022 0913   LYMPHSABS 1.5 10/24/2022 0913   MONOABS 0.5 10/24/2022 0913   EOSABS 0.3 10/24/2022 0913   BASOSABS 0.1 10/24/2022 0913     CMP     Component Value Date/Time   NA 137 10/24/2022 0913   K 3.7 10/24/2022 0913   CL 104 10/24/2022 0913   CO2 26 10/24/2022 0913   GLUCOSE 184 (H) 10/24/2022 0913   BUN 23 (H) 10/24/2022 0913   CREATININE 1.46 (H) 10/24/2022 0913   CREATININE 1.19 06/09/2020 1613   CALCIUM 9.6 10/24/2022 0913   PROT 7.2 10/24/2022 0913   ALBUMIN 4.3 10/24/2022 0913   AST 18 10/24/2022 0913   ALT 17 10/24/2022 0913   ALKPHOS 72 10/24/2022 0913   BILITOT 0.5  10/24/2022 0913   GFRNONAA 57 (L) 10/24/2022 0913     ASSESSMENT & PLAN:52 year old male   Moderately differentiated adenocarcinoma of the rectum -We reviewed his medical record in detail with the patient and his wife.  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 CT negative for distant metastasis -local staging  MRI showed T1/T2, N0 rectal cancer and likely reactive inguinal nodes.  -rectal EUS showed uT2, uN0 disease, case reviewed in multidisciplinary conference and the recommendation was for upfront surgery -Proceeded with LAR and diverting ileostomy with Dr. Dema Severin on 08/23/2022. I reviewed surgical path which showed invasive grade 2 adenocarcinoma spanning 2.5 cm involving the rectum with carcinoma invading focally into the perirectal adipose tissue.  0/14 lymph nodes involved with clear margins and no LVI. Stage pT3 pN0, IIA, with no high risk features, MMR normal and MSI stable  -Case again discussed in conference and the recommendation is for 3 months adjuvant chemo. Dr. Lisbeth Renshaw felt that he could forego radiation -Began adjuvant FOLFOX 10/10/2022 -Mr. Rozmus appears stable. S/p cycle 1 FOLFOX, he tolerated mostly well with mild fatigue, cold sensitivity 3-4 days, nausea, and 2 episodes of dizziness which may have been positional vs external factors.  -Side effects were well managed with supportive care at home. He is able to recover and function well. I will release him back to work, see letter -Labs reviewed, Scr up slightly from cycle 1. Will see if we can give IVF with pump d/c. Hgb improving. Adequate for treatment -Proceed with cycle 2 FOLFOX today as planned, same dose -F/up and cycle 3 in 2 weeks   2. IDA -Secondary to #1 -Started oral iron in 03/2022. Tried taking with vit C and separating from reflux  meds. Eventually he can not tolerate oral iron and stopped -We discussed possible IV iron; high hgb has been improving since surgery -No longer taking oral iron.  Hgb continues to improve   3. incidental findings on rectal staging: Enlarged thoracic aorta and coronary artery calcifications -Met with cardiothoracic surgery, this is being monitored. No plan for surgery at this time -Further cardiac work up from 10/22/22 per Dr. Gasper Sells showed stability from 07/2022 and LGE, but given good heart function cardiology does not feel this is related to FOLFOX (only had 1 cycle at the time of imaging) -Cont cardiology f/up   4. Chronic conditions: HTN, HL, DM, GERD, former tobacco and current alcohol use -On amlodipine, carvedilol, losartan, metformin, and esomeprazole -Last hemoglobin A1c 7.2 in 10/2021 -He quit smoking more than 20 years ago and has decreased his alcohol intake.   -Continues to cut back on alcohol, caffeine, and sugar intake -F/up PCP     PLAN: -Labs reviewed -Proceed with cycle 2 FOLFOX today as planned, same dose -IVF with pump d/c (orders in supportive care) -Letter to return to work -F/up and cycle 3 in 2 weeks    All questions were answered. The patient knows to call the clinic with any problems, questions or concerns. No barriers to learning were detected. I spent 20 minutes counseling the patient face to face. The total time spent in the appointment was 30 minutes and more than 50% was on counseling, review of test results, and coordination of care.   Cira Rue, NP-C 10/24/2022

## 2022-10-23 NOTE — Telephone Encounter (Signed)
Called Patient.  Left Voicemail: Attempted to go over his MRI/MRA results.  Will attempt to call 10/24/22.  If we still miss him, will send a MyChart Message with results  Rudean Haskell, [address] [place], [place] [address] [phone]  4:52 PM

## 2022-10-24 ENCOUNTER — Encounter: Payer: Self-pay | Admitting: Surgery

## 2022-10-24 ENCOUNTER — Encounter: Payer: Self-pay | Admitting: Nurse Practitioner

## 2022-10-24 ENCOUNTER — Inpatient Hospital Stay (HOSPITAL_BASED_OUTPATIENT_CLINIC_OR_DEPARTMENT_OTHER): Payer: BLUE CROSS/BLUE SHIELD | Admitting: Nurse Practitioner

## 2022-10-24 ENCOUNTER — Inpatient Hospital Stay: Payer: BLUE CROSS/BLUE SHIELD

## 2022-10-24 VITALS — BP 150/81 | HR 76 | Resp 18

## 2022-10-24 DIAGNOSIS — C20 Malignant neoplasm of rectum: Secondary | ICD-10-CM

## 2022-10-24 DIAGNOSIS — Z95828 Presence of other vascular implants and grafts: Secondary | ICD-10-CM

## 2022-10-24 DIAGNOSIS — Z5111 Encounter for antineoplastic chemotherapy: Secondary | ICD-10-CM | POA: Diagnosis not present

## 2022-10-24 LAB — CBC WITH DIFFERENTIAL (CANCER CENTER ONLY)
Abs Immature Granulocytes: 0.02 K/uL (ref 0.00–0.07)
Basophils Absolute: 0.1 K/uL (ref 0.0–0.1)
Basophils Relative: 1 %
Eosinophils Absolute: 0.3 K/uL (ref 0.0–0.5)
Eosinophils Relative: 6 %
HCT: 36.5 % — ABNORMAL LOW (ref 39.0–52.0)
Hemoglobin: 12.2 g/dL — ABNORMAL LOW (ref 13.0–17.0)
Immature Granulocytes: 0 %
Lymphocytes Relative: 25 %
Lymphs Abs: 1.5 K/uL (ref 0.7–4.0)
MCH: 25.5 pg — ABNORMAL LOW (ref 26.0–34.0)
MCHC: 33.4 g/dL (ref 30.0–36.0)
MCV: 76.4 fL — ABNORMAL LOW (ref 80.0–100.0)
Monocytes Absolute: 0.5 K/uL (ref 0.1–1.0)
Monocytes Relative: 9 %
Neutro Abs: 3.5 K/uL (ref 1.7–7.7)
Neutrophils Relative %: 59 %
Platelet Count: 156 K/uL (ref 150–400)
RBC: 4.78 MIL/uL (ref 4.22–5.81)
RDW: 17.3 % — ABNORMAL HIGH (ref 11.5–15.5)
WBC Count: 6 K/uL (ref 4.0–10.5)
nRBC: 0 % (ref 0.0–0.2)

## 2022-10-24 LAB — CMP (CANCER CENTER ONLY)
ALT: 17 U/L (ref 0–44)
AST: 18 U/L (ref 15–41)
Albumin: 4.3 g/dL (ref 3.5–5.0)
Alkaline Phosphatase: 72 U/L (ref 38–126)
Anion gap: 7 (ref 5–15)
BUN: 23 mg/dL — ABNORMAL HIGH (ref 6–20)
CO2: 26 mmol/L (ref 22–32)
Calcium: 9.6 mg/dL (ref 8.9–10.3)
Chloride: 104 mmol/L (ref 98–111)
Creatinine: 1.46 mg/dL — ABNORMAL HIGH (ref 0.61–1.24)
GFR, Estimated: 57 mL/min — ABNORMAL LOW (ref >60)
Glucose, Bld: 184 mg/dL — ABNORMAL HIGH (ref 70–99)
Potassium: 3.7 mmol/L (ref 3.5–5.1)
Sodium: 137 mmol/L (ref 135–145)
Total Bilirubin: 0.5 mg/dL (ref 0.3–1.2)
Total Protein: 7.2 g/dL (ref 6.5–8.1)

## 2022-10-24 MED ORDER — PALONOSETRON HCL INJECTION 0.25 MG/5ML
0.2500 mg | Freq: Once | INTRAVENOUS | Status: AC
  Administered 2022-10-24: 0.25 mg via INTRAVENOUS
  Filled 2022-10-24: qty 5

## 2022-10-24 MED ORDER — LEUCOVORIN CALCIUM INJECTION 350 MG
400.0000 mg/m2 | Freq: Once | INTRAVENOUS | Status: AC
  Administered 2022-10-24: 1016 mg via INTRAVENOUS
  Filled 2022-10-24: qty 50.8

## 2022-10-24 MED ORDER — FLUOROURACIL CHEMO INJECTION 2.5 GM/50ML
400.0000 mg/m2 | Freq: Once | INTRAVENOUS | Status: AC
  Administered 2022-10-24: 1000 mg via INTRAVENOUS
  Filled 2022-10-24: qty 20

## 2022-10-24 MED ORDER — SODIUM CHLORIDE 0.9 % IV SOLN
2400.0000 mg/m2 | INTRAVENOUS | Status: DC
  Administered 2022-10-24: 6100 mg via INTRAVENOUS
  Filled 2022-10-24: qty 122

## 2022-10-24 MED ORDER — SODIUM CHLORIDE 0.9% FLUSH
10.0000 mL | Freq: Once | INTRAVENOUS | Status: AC
  Administered 2022-10-24: 10 mL

## 2022-10-24 MED ORDER — DEXTROSE 5 % IV SOLN: Freq: Once | INTRAVENOUS | Status: AC

## 2022-10-24 MED ORDER — SODIUM CHLORIDE 0.9 % IV SOLN
10.0000 mg | Freq: Once | INTRAVENOUS | Status: AC
  Administered 2022-10-24: 10 mg via INTRAVENOUS
  Filled 2022-10-24: qty 10

## 2022-10-24 MED ORDER — OXALIPLATIN CHEMO INJECTION 100 MG/20ML
85.0000 mg/m2 | Freq: Once | INTRAVENOUS | Status: AC
  Administered 2022-10-24: 200 mg via INTRAVENOUS
  Filled 2022-10-24: qty 40

## 2022-10-24 MED ORDER — SODIUM CHLORIDE 0.9 % IV SOLN: Freq: Once | INTRAVENOUS | Status: AC

## 2022-10-24 NOTE — Telephone Encounter (Signed)
Pt had testing on 10/22/22.  RN never able to verbally go over Cmri instructions.

## 2022-10-24 NOTE — Patient Instructions (Signed)
[facility]  Discharge Instructions: Thank you for choosing [facility] to provide your oncology and hematology care.   If you have a lab appointment with the [facility], please go directly to the [facility] and check in at the registration area.   Wear comfortable clothing and clothing appropriate for easy access to any Portacath or PICC line.   We strive to give you quality time with your provider. You may need to reschedule your appointment if you arrive late (15 or more minutes).  Arriving late affects you and other patients whose appointments are after yours.  Also, if you miss three or more appointments without notifying the office, you may be dismissed from the clinic at the provider's discretion.      For prescription refill requests, have your pharmacy contact our office and allow 72 hours for refills to be completed.    Today you received the following chemotherapy and/or immunotherapy agents leucovorin, fluorourcil, oxaliplatin      To help prevent nausea and vomiting after your treatment, we encourage you to take your nausea medication as directed.  BELOW ARE SYMPTOMS THAT SHOULD BE REPORTED IMMEDIATELY: *FEVER GREATER THAN 100.4 F (38 C) OR HIGHER *CHILLS OR SWEATING *NAUSEA AND VOMITING THAT IS NOT CONTROLLED WITH YOUR NAUSEA MEDICATION *UNUSUAL SHORTNESS OF BREATH *UNUSUAL BRUISING OR BLEEDING *URINARY PROBLEMS (pain or burning when urinating, or frequent urination) *BOWEL PROBLEMS (unusual diarrhea, constipation, pain near the anus) TENDERNESS IN MOUTH AND THROAT WITH OR WITHOUT PRESENCE OF ULCERS (sore throat, sores in mouth, or a toothache) UNUSUAL RASH, SWELLING OR PAIN  UNUSUAL VAGINAL DISCHARGE OR ITCHING   Items with * indicate a potential emergency and should be followed up as soon as possible or go to the Emergency Department if any problems should occur.  Please show the CHEMOTHERAPY ALERT CARD or  IMMUNOTHERAPY ALERT CARD at check-in to the Emergency Department and triage nurse.  Should you have questions after your visit or need to cancel or reschedule your appointment, please contact [facility]  Dept: [phone]  and follow the prompts.  Office hours are 8:00 a.m. to 4:30 p.m. Monday - Friday. Please note that voicemails left after 4:00 p.m. may not be returned until the following business day.  We are closed weekends and major holidays. You have access to a nurse at all times for urgent questions. Please call the main number to the clinic Dept: [phone] and follow the prompts.   For any non-urgent questions, you may also contact your provider using MyChart. We now offer e-Visits for anyone 61 and older to request care online for non-urgent symptoms. For details visit [email or web address].   Also download the MyChart app! Go to the app store, search "MyChart", open the app, select [facility], and log in with your MyChart username and password.   Rehydration, Adult Rehydration is the replacement of fluids, salts, and minerals in the body (electrolytes) that are lost during dehydration. Dehydration is when there is not enough water or other fluids in the body. This happens when you lose more fluids than you take in. Common causes of dehydration include: Not drinking enough fluids. This can occur when you are ill or doing activities that require a lot of energy, especially in hot weather. Conditions that cause loss of water or other fluids. These include diarrhea, vomiting, sweating, and urinating a lot. Other illnesses, such as fever or infection. Certain medicines, such as  those that remove excess fluid from the body (diuretics). Symptoms of mild or moderate dehydration may include thirst, dry lips and mouth, and dizziness. Symptoms of severe dehydration may include increased heart rate, confusion, fainting, and not urinating. In severe cases,  you may need to get fluids through an IV at the hospital. For mild or moderate cases, you can usually rehydrate at home by drinking certain fluids as told by your health care provider. What are the risks? Your health care provider will talk with you about risks. Your health care provider will talk with you about risks. This may include taking in too much fluid (overhydration). This is rare. Overhydration can cause an imbalance of electrolytes in the body, kidney failure, or a decrease in salt (sodium) levels in the body. Supplies needed: You will need an oral rehydration solution (ORS) if your health care provider tells you to use one. This is a drink to treat dehydration. It can be found in pharmacies and retail stores. How to rehydrate Fluids Follow instructions from your health care provider about what to drink. The kind of fluid and the amount you should drink depend on your condition. In general, you should choose drinks that you prefer. If told by your health care provider, drink an ORS. Make an ORS by following instructions on the package. Start by drinking small amounts, about  cup (120 mL) every 5-10 minutes. Slowly increase how much you drink until you have taken in the amount recommended by your health care provider. Drink enough clear fluids to keep your urine pale yellow. If you were told to drink an ORS, finish it first, then start slowly drinking other clear fluids. Drink fluids such as: Water. This includes sparkling and flavored water. Drinking only water can lead to having too little sodium in your body (hyponatremia). Follow the advice of your health care provider. Water from ice chips you suck on. Fruit juice with water added to it (diluted). Sports drinks. Hot or cold herbal teas. Broth-based soups. Milk or milk products. Food Follow instructions from your health care provider about what to eat while you rehydrate. Your health care provider may recommend that you slowly  begin eating regular foods in small amounts. Eat foods that contain a healthy balance of electrolytes, such as bananas, oranges, potatoes, tomatoes, and spinach. Avoid foods that are greasy or contain a lot of sugar. In some cases, you may get nutrition through a feeding tube that is passed through your nose and into your stomach (nasogastric tube, or NG tube). This may be done if you have uncontrolled vomiting or diarrhea. Drinks to avoid  Certain drinks may make dehydration worse. While you rehydrate, avoid drinking alcohol. How to tell if you are recovering from dehydration You may be getting better if: You are urinating more often than before you started rehydrating. Your urine is pale yellow. Your energy level improves. You vomit less often. You have diarrhea less often. Your appetite improves or returns to normal. You feel less dizzy or light-headed. Your skin tone and color start to look more normal. Follow these instructions at home: Take over-the-counter and prescription medicines only as told by your health care provider. Do not take sodium tablets. Doing this can lead to having too much sodium in your body (hypernatremia). Contact a health care provider if: You continue to have symptoms of mild or moderate dehydration, such as: Thirst. Dry lips. Slightly dry mouth. Dizziness. Dark urine or less urine than normal. Muscle cramps. You continue to  vomit or have diarrhea. Get help right away if: You have symptoms of dehydration that get worse. You have a fever. You have a severe headache. You have been vomiting and have problems, such as: Your vomiting gets worse or does not go away. Your vomit includes blood or green matter (bile). You cannot eat or drink without vomiting. You have problems with urination or bowel movements, such as: Diarrhea that gets worse or does not go away. Blood in your stool (feces). This may cause stool to look black and tarry. Not urinating, or  urinating only a small amount of very dark urine, within 6-8 hours. You have trouble breathing. You have symptoms that get worse with treatment. These symptoms may be an emergency. Get help right away. Call 911. Do not wait to see if the symptoms will go away. Do not drive yourself to the hospital. This information is not intended to replace advice given to you by your health care provider. Make sure you discuss any questions you have with your health care provider. Document Revised: 12/11/2021 Document Reviewed: 12/11/2021 Elsevier Patient Education  [place].

## 2022-10-24 NOTE — Progress Notes (Signed)
Okay to give 533m NS over 1 hr and not do full 1L per LCira RueNP

## 2022-10-24 NOTE — Progress Notes (Signed)
Oxaliplatin diluted in D5W 538m, Exp:09/2023, Lot: [id number]  PRaul DelLSouth [place] RNorth Nicholson BCPS, BCOP 10/24/2022 11:19 AM

## 2022-10-25 ENCOUNTER — Other Ambulatory Visit: Payer: Self-pay | Admitting: Surgery

## 2022-10-25 DIAGNOSIS — C20 Malignant neoplasm of rectum: Secondary | ICD-10-CM

## 2022-10-26 ENCOUNTER — Inpatient Hospital Stay: Payer: BLUE CROSS/BLUE SHIELD

## 2022-10-26 ENCOUNTER — Other Ambulatory Visit: Payer: Self-pay

## 2022-10-26 VITALS — BP 128/81 | HR 62 | Temp 98.7°F | Resp 18

## 2022-10-26 DIAGNOSIS — C20 Malignant neoplasm of rectum: Secondary | ICD-10-CM

## 2022-10-26 DIAGNOSIS — Z5111 Encounter for antineoplastic chemotherapy: Secondary | ICD-10-CM | POA: Diagnosis not present

## 2022-10-26 MED ORDER — HEPARIN SOD (PORK) LOCK FLUSH 100 UNIT/ML IV SOLN
500.0000 [IU] | Freq: Once | INTRAVENOUS | Status: AC | PRN
  Administered 2022-10-26: 500 [IU]

## 2022-10-26 MED ORDER — SODIUM CHLORIDE 0.9% FLUSH
10.0000 mL | INTRAVENOUS | Status: DC | PRN
  Administered 2022-10-26: 10 mL

## 2022-11-07 MED FILL — Dexamethasone Sodium Phosphate Inj 100 MG/10ML: INTRAMUSCULAR | Qty: 1 | Status: AC

## 2022-11-07 NOTE — Progress Notes (Signed)
Patient Care Team: Bradd Canary, MD as PCP - General (Family Medicine) Christell Constant, MD as PCP - Cardiology (Cardiology) Malachy Mood, MD as Consulting Physician (Oncology) Pollyann Samples, NP as Nurse Practitioner (Oncology) Marily Lente, RN as Oncology Nurse Navigator (Oncology)   CHIEF COMPLAINT: Follow up rectal cancer   Oncology History  Rectal cancer [facility])  05/09/2022 Procedure   Colonoscopy by Dr. Russella Dar, impression - Five 5 to 7 mm polyps in the rectum, in the transverse colon and at the hepatic flexure, removed with a cold snare. Resected and retrieved. removed with a cold snare. Resected and retrieved. 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 Submucosal injection tattoo. - Internal hemorrhoids. - The examination was otherwise normal on direct and retroflexion views.   05/09/2022 Initial Biopsy   Diagnosis 1. Surgical [P], colon, transverse x1 and hepatic flexure x 3 (2 pieces only, 2 stuck together taken off together), polyp (4) - TUBULAR ADENOMA(S) - NEGATIVE FOR HIGH-GRADE DYSPLASIA OR MALIGNANCY 2. Surgical [P], colon, rectal mass - INVASIVE MODERATELY DIFFERENTIATED ADENOCARCINOMA. SEE NOTE 3. Surgical [P], colon, rectum, polyp (1) - HYPERPLASTIC POLYP   05/09/2022 Tumor Marker   CEA < 2.0   05/17/2022 Imaging   IMPRESSION: 1. Known primary malignancy is not well visualized. Correlate with recent colonoscopy. 2. No evidence of metastatic disease in the chest, abdomen or pelvis. 3. Mildly enlarged bilateral inguinal lymph nodes, likely reactive. 4. 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 Recommend annual imaging followup by CTA or MRA. This recommendation follows 2010 ACCF/AHA/AATS/ACR/ASA/SCA/SCAI/SIR/STS/SVM Guidelines for the Diagnosis and Management of Patients with Thoracic Aortic Disease. Circulation. 2010; 121: R604-V409. 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 Severe coronary artery calcifications of the LAD and  circumflex. 6. Aortic Atherosclerosis (ICD10-I70.0).   05/23/2022 Initial Diagnosis   Rectal cancer (HCC)   08/23/2022 Cancer Staging   Staging form: Colon and Rectum, AJCC 8th Edition - Pathologic stage from 08/23/2022: Stage IIA (pT3, pN0, cM0) - Signed by Pollyann Samples, NP on 09/10/2022 Stage prefix: Initial diagnosis Total positive nodes: 0   08/23/2022 Surgery   PROCEDURE:  Robotic assisted low anterior resection with diverting loop ileostomy SURGEON: Stephanie Coup. Cliffton Asters, MD ASSISTANT: Romie Levee, MD   08/23/2022 Pathology Results   FINAL MICROSCOPIC DIAGNOSIS:  A. COLON, RECTOSIGMOID, RESECTION:  - Invasive moderately differentiated adenocarcinoma, 2.5 cm, involving rectum  - Carcinoma invades focally into the perirectal adipose tissue  - Resection margins are negative for carcinoma  - Negative for lymphovascular invasion  - Fourteen lymph nodes, negative for carcinoma (0/14)  - See oncology table  B. DISTAL MARGIN DONUT:  - Rectal donut, negative for carcinoma   Regional Lymph Nodes:       Number of Lymph Nodes with Tumor: 0       Number of Lymph Nodes Examined: 14  Pathologic Stage Classification (pTNM, AJCC 8th Edition): pT3, pN0  Mismatch Repair Protein (IHC)  SUMMARY INTERPRETATION: NORMAL  MSI-Stable   09/27/2022 - 09/27/2022 Chemotherapy   Patient is on Treatment Plan : RECTAL Xelox (Capeox) (130/850) q21d x 6 cycles     10/10/2022 -  Chemotherapy   Patient is on Treatment Plan : COLORECTAL FOLFOX q14d x 3 months        CURRENT THERAPY: Adjuvant chemo x 3 months, FOLFOX q2 weeks, starting 10/10/22   INTERVAL HISTORY Mr. Sanson returns for follow up and treatment as scheduled. Last seen by me 10/24/22 with cycle 2 FOLFOX. He received IVF with pump  d/c.  Cold sensitivity was more intense but did not last any longer, still tapered by day 5 without significant neuropathy in the absence of cold.  He worked 4 days last week but had to cut his hours on day 5 due to  fatigue.  On 3/25 he felt his ostomy output was getting out of control he attributed to going back to work, stress, and reflux flare.  Usually takes 1 Imodium daily but took 2, and additional PPI and he was able to get this back under control.  Stools usually watery in the morning then thickened throughout the day which is his normal since surgery.  Denies nausea, vomiting, pain.  He had 3 nosebleeds since last cycle which were large but stopped on their own.  Denies other bleeding.  ROS  All other systems reviewed and negative  Past Medical History:  Diagnosis Date   Arthralgia 01/11/2013   Diffuse, b/l Hips R>L Knees, ankles, low back   Arthritis    Back pain 12/08/2015   Cancer (HCC)    Coronary artery disease    Diabetes mellitus type 2 in obese (HCC) 11/09/2012   Dyslipidemia 11/09/2012   Esophageal reflux 12/08/2015   Feeling grief 06/09/2020   History of migraine headaches    Hyperglycemia 11/09/2012   Hypertension      Past Surgical History:  Procedure Laterality Date   DIVERTING ILEOSTOMY N/A 08/23/2022   Procedure: DIVERTING LOOP ILEOSTOMY;  Surgeon: Andria Meuse, MD;  Location: [facility];  Service: General;  Laterality: N/A;   EUS N/A 06/21/2022   Procedure: LOWER ENDOSCOPIC ULTRASOUND (EUS);  Surgeon: Lemar Lofty., MD;  Location: [facility];  Service: Gastroenterology;  Laterality: N/A;   FLEXIBLE SIGMOIDOSCOPY N/A 06/21/2022   Procedure: FLEXIBLE SIGMOIDOSCOPY;  Surgeon: Meridee Score Netty Starring., MD;  Location: [facility];  Service: Gastroenterology;  Laterality: N/A;   FLEXIBLE SIGMOIDOSCOPY N/A 08/23/2022   Procedure: FLEXIBLE SIGMOIDOSCOPY;  Surgeon: Andria Meuse, MD;  Location: [facility];  Service: General;  Laterality: N/A;   PORTACATH PLACEMENT N/A 09/21/2022   Procedure: INSERTION PORT-A-CATH WITH ULTRASOUND GUIDANCE;  Surgeon: Andria Meuse, MD;  Location: [facility];  Service: General;  Laterality: N/A;   XI ROBOTIC ASSISTED LOWER  ANTERIOR RESECTION N/A 08/23/2022   Procedure: XI ROBOTIC ASSISTED LOWER ANTERIOR RESECTION with Introperative assessment of profusion and TAP Block;  Surgeon: Andria Meuse, MD;  Location: [facility];  Service: General;  Laterality: N/A;     Outpatient Encounter Medications as of 11/08/2022  Medication Sig   carvedilol (COREG) 25 MG tablet Take 1 tablet (25 mg total) by mouth 2 (two) times daily.   glucose blood (COOL BLOOD GLUCOSE TEST STRIPS) test strip Check blood sugar once daily   loperamide (IMODIUM) 2 MG capsule Take 1 capsule (2 mg total) by mouth 2 (two) times daily.   metFORMIN (GLUCOPHAGE) 500 MG tablet Take 1 tablet (500 mg total) by mouth daily with breakfast. (Patient taking differently: Take 500 mg by mouth every evening.)   ondansetron (ZOFRAN) 4 MG tablet Take 1 tablet (4 mg total) by mouth every 6 (six) hours as needed for nausea.   pantoprazole (PROTONIX) 40 MG tablet Take 1 tablet (40 mg total) by mouth daily.   sitaGLIPtin (JANUVIA) 100 MG tablet Take 1 tablet (100 mg total) by mouth daily.   tiZANidine (ZANAFLEX) 2 MG tablet Take 0.5-2 tablets (1-4 mg total) by mouth 2 (two) times daily as needed for muscle spasms. (Patient taking differently: Take 2-4 mg by  mouth 3 (three) times daily as needed for muscle spasms.)   No facility-administered encounter medications on file as of 11/08/2022.     Today's Vitals   11/08/22 1109  BP: (!) 135/92  Pulse: 72  Resp: 18  Temp: 98.7 F (37.1 C)  TempSrc: Oral  SpO2: 97%  Weight: 257 lb (116.6 kg)   Body mass index is 32.12 kg/m.   PHYSICAL EXAM GENERAL:alert, no distress and comfortable SKIN: no rash  EYES: sclera clear NECK: without mass LYMPH:  no palpable cervical or supraclavicular lymphadenopathy  LUNGS: clear with normal breathing effort HEART: regular rate & rhythm, no lower extremity edema ABDOMEN: abdomen soft, non-tender and normal bowel sounds NEURO: alert & oriented x 3 with fluent speech, no focal  motor/sensory deficits PAC without erythema    CBC    Component Value Date/Time   WBC 5.7 11/08/2022 1039   WBC 6.2 09/30/2022 0558   RBC 4.94 11/08/2022 1039   HGB 12.8 (L) 11/08/2022 1039   HCT 37.8 (L) 11/08/2022 1039   PLT 129 (L) 11/08/2022 1039   MCV 76.5 (L) 11/08/2022 1039   MCH 25.9 (L) 11/08/2022 1039   MCHC 33.9 11/08/2022 1039   RDW 19.1 (H) 11/08/2022 1039   LYMPHSABS 1.4 11/08/2022 1039   MONOABS 0.7 11/08/2022 1039   EOSABS 0.1 11/08/2022 1039   BASOSABS 0.1 11/08/2022 1039     CMP     Component Value Date/Time   NA 137 11/08/2022 1039   K 3.8 11/08/2022 1039   CL 104 11/08/2022 1039   CO2 27 11/08/2022 1039   GLUCOSE 185 (H) 11/08/2022 1039   BUN 22 (H) 11/08/2022 1039   CREATININE 1.53 (H) 11/08/2022 1039   CREATININE 1.19 06/09/2020 1613   CALCIUM 9.9 11/08/2022 1039   PROT 7.6 11/08/2022 1039   ALBUMIN 4.5 11/08/2022 1039   AST 22 11/08/2022 1039   ALT 21 11/08/2022 1039   ALKPHOS 83 11/08/2022 1039   BILITOT 0.7 11/08/2022 1039   GFRNONAA 54 (L) 11/08/2022 1039     ASSESSMENT & PLAN: 52 year old male   Moderately differentiated adenocarcinoma of the rectum -We reviewed his medical record in detail with the patient and his wife.  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 CT negative for distant metastasis -local staging MRI showed T1/T2, N0 rectal cancer and likely reactive inguinal nodes.  -rectal EUS showed uT2, uN0 disease, case reviewed in multidisciplinary conference and the recommendation was for upfront surgery -Proceeded with LAR and diverting ileostomy with Dr. Cliffton Asters on 08/23/2022. I reviewed surgical path which showed invasive grade 2 adenocarcinoma spanning 2.5 cm involving the rectum with carcinoma invading focally into the perirectal adipose tissue.  0/14  lymph nodes involved with clear margins and no LVI. Stage pT3 pN0, IIA, with no high risk features, MMR normal and MSI stable  -Case again discussed in conference and the recommendation is for 3 months adjuvant chemo. Dr. Mitzi Hansen felt that he could forego radiation -Began adjuvant FOLFOX 10/10/2022 -Mr. Borjas appears stable. S/p 2 cycles adjuvant FOLFOX, tolerating moderately well with fatigue and cold sensitivity. He recovers well after day 5. He was able to work. He had 3 self limiting nosebleeds. We reviewed symptom management.  -He continues to have high ostomy output which I feel is contributing to Scr elevation. He will increase imodium as needed. Will give IVF on day  3. Otherwise doing well -He is losing weight on chemo. I recommend to start boost/ensure to prevent further weight loss. He will f/up with nutrition today as well -Labs reviewed, proceed with cycle 3 FOLFOX today as planned, same dose -F/up in 2 weeks with cycle 4   2. IDA -Secondary to #1 -Started oral iron in 03/2022. Tried taking with vit C and separating from reflux meds. Eventually he can not tolerate oral iron and stopped -hgb has been improving since surgery; no longer taking oral iron.  -Hgb continues to improve   3. incidental findings on rectal staging: Enlarged thoracic aorta and coronary artery calcifications -Met with cardiothoracic surgery, this is being monitored. No plan for surgery at this time -Further cardiac work up from 10/22/22 per Dr. Izora Ribas showed stability from 07/2022 and LGE, but given good heart function cardiology does not feel this is related to FOLFOX (only had 1 cycle at the time of imaging) -Continue cardiology f/up   4. Chronic conditions: HTN, HL, DM, GERD, former tobacco and current alcohol use -On amlodipine, carvedilol, losartan, metformin, and esomeprazole -Last hemoglobin A1c 7.2 in 10/2021 -He quit smoking more than 20 years ago and has decreased his alcohol intake.   -Continues  to cut back on alcohol, caffeine, and sugar intake -F/up PCP   PLAN: -Labs reviewed -Proceed with cycle 3 FOLFOX today as planned, same dose -IVF on day 3 pump DC -Add nutrition supplements such as boost/Ensure to prevent further weight loss; follow-up with nutrition today -F/up and cycle 4 in 2 weeks    All questions were answered. The patient knows to call the clinic with any problems, questions or concerns. No barriers to learning were detected. I spent 20 minutes counseling the patient face to face. The total time spent in the appointment was 30 minutes and more than 50% was on counseling, review of test results, and coordination of care.   Robert Glad, NP-C 11/08/2022

## 2022-11-08 ENCOUNTER — Encounter: Payer: Self-pay | Admitting: Nurse Practitioner

## 2022-11-08 ENCOUNTER — Inpatient Hospital Stay (HOSPITAL_BASED_OUTPATIENT_CLINIC_OR_DEPARTMENT_OTHER): Payer: BLUE CROSS/BLUE SHIELD | Admitting: Nurse Practitioner

## 2022-11-08 ENCOUNTER — Inpatient Hospital Stay: Payer: BLUE CROSS/BLUE SHIELD

## 2022-11-08 ENCOUNTER — Inpatient Hospital Stay: Payer: BLUE CROSS/BLUE SHIELD | Admitting: Dietician

## 2022-11-08 DIAGNOSIS — Z95828 Presence of other vascular implants and grafts: Secondary | ICD-10-CM

## 2022-11-08 DIAGNOSIS — C20 Malignant neoplasm of rectum: Secondary | ICD-10-CM

## 2022-11-08 DIAGNOSIS — Z5111 Encounter for antineoplastic chemotherapy: Secondary | ICD-10-CM | POA: Diagnosis not present

## 2022-11-08 LAB — CMP (CANCER CENTER ONLY)
ALT: 21 U/L (ref 0–44)
AST: 22 U/L (ref 15–41)
Albumin: 4.5 g/dL (ref 3.5–5.0)
Alkaline Phosphatase: 83 U/L (ref 38–126)
Anion gap: 6 (ref 5–15)
BUN: 22 mg/dL — ABNORMAL HIGH (ref 6–20)
CO2: 27 mmol/L (ref 22–32)
Calcium: 9.9 mg/dL (ref 8.9–10.3)
Chloride: 104 mmol/L (ref 98–111)
Creatinine: 1.53 mg/dL — ABNORMAL HIGH (ref 0.61–1.24)
GFR, Estimated: 54 mL/min — ABNORMAL LOW (ref >60)
Glucose, Bld: 185 mg/dL — ABNORMAL HIGH (ref 70–99)
Potassium: 3.8 mmol/L (ref 3.5–5.1)
Sodium: 137 mmol/L (ref 135–145)
Total Bilirubin: 0.7 mg/dL (ref 0.3–1.2)
Total Protein: 7.6 g/dL (ref 6.5–8.1)

## 2022-11-08 LAB — CBC WITH DIFFERENTIAL (CANCER CENTER ONLY)
Abs Immature Granulocytes: 0.01 10*3/uL (ref 0.00–0.07)
Basophils Absolute: 0.1 10*3/uL (ref 0.0–0.1)
Basophils Relative: 1 %
Eosinophils Absolute: 0.1 10*3/uL (ref 0.0–0.5)
Eosinophils Relative: 2 %
HCT: 37.8 % — ABNORMAL LOW (ref 39.0–52.0)
Hemoglobin: 12.8 g/dL — ABNORMAL LOW (ref 13.0–17.0)
Immature Granulocytes: 0 %
Lymphocytes Relative: 24 %
Lymphs Abs: 1.4 10*3/uL (ref 0.7–4.0)
MCH: 25.9 pg — ABNORMAL LOW (ref 26.0–34.0)
MCHC: 33.9 g/dL (ref 30.0–36.0)
MCV: 76.5 fL — ABNORMAL LOW (ref 80.0–100.0)
Monocytes Absolute: 0.7 10*3/uL (ref 0.1–1.0)
Monocytes Relative: 13 %
Neutro Abs: 3.5 10*3/uL (ref 1.7–7.7)
Neutrophils Relative %: 60 %
Platelet Count: 129 10*3/uL — ABNORMAL LOW (ref 150–400)
RBC: 4.94 MIL/uL (ref 4.22–5.81)
RDW: 19.1 % — ABNORMAL HIGH (ref 11.5–15.5)
WBC Count: 5.7 10*3/uL (ref 4.0–10.5)
nRBC: 0 % (ref 0.0–0.2)

## 2022-11-08 MED ORDER — SODIUM CHLORIDE 0.9% FLUSH
10.0000 mL | Freq: Once | INTRAVENOUS | Status: AC
  Administered 2022-11-08: 10 mL

## 2022-11-08 MED ORDER — FLUOROURACIL CHEMO INJECTION 2.5 GM/50ML
400.0000 mg/m2 | Freq: Once | INTRAVENOUS | Status: AC
  Administered 2022-11-08: 1000 mg via INTRAVENOUS
  Filled 2022-11-08: qty 20

## 2022-11-08 MED ORDER — PALONOSETRON HCL INJECTION 0.25 MG/5ML
0.2500 mg | Freq: Once | INTRAVENOUS | Status: AC
  Administered 2022-11-08: 0.25 mg via INTRAVENOUS
  Filled 2022-11-08: qty 5

## 2022-11-08 MED ORDER — HEPARIN SOD (PORK) LOCK FLUSH 100 UNIT/ML IV SOLN: 500.0000 [IU] | Freq: Once | INTRAVENOUS | Status: DC | PRN

## 2022-11-08 MED ORDER — OXALIPLATIN CHEMO INJECTION 100 MG/20ML
85.0000 mg/m2 | Freq: Once | INTRAVENOUS | Status: AC
  Administered 2022-11-08: 200 mg via INTRAVENOUS
  Filled 2022-11-08: qty 40

## 2022-11-08 MED ORDER — DEXTROSE 5 % IV SOLN: Freq: Once | INTRAVENOUS | Status: DC

## 2022-11-08 MED ORDER — SODIUM CHLORIDE 0.9 % IV SOLN
10.0000 mg | Freq: Once | INTRAVENOUS | Status: AC
  Administered 2022-11-08: 10 mg via INTRAVENOUS
  Filled 2022-11-08: qty 10

## 2022-11-08 MED ORDER — SODIUM CHLORIDE 0.9 % IV SOLN
2400.0000 mg/m2 | INTRAVENOUS | Status: DC
  Administered 2022-11-08: 6100 mg via INTRAVENOUS
  Filled 2022-11-08: qty 122

## 2022-11-08 MED ORDER — SODIUM CHLORIDE 0.9% FLUSH: 10.0000 mL | INTRAVENOUS | Status: DC | PRN

## 2022-11-08 MED ORDER — LEUCOVORIN CALCIUM INJECTION 350 MG
400.0000 mg/m2 | Freq: Once | INTRAVENOUS | Status: AC
  Administered 2022-11-08: 1016 mg via INTRAVENOUS
  Filled 2022-11-08: qty 50.8

## 2022-11-08 MED ORDER — DEXTROSE 5 % IV SOLN: Freq: Once | INTRAVENOUS | Status: AC

## 2022-11-08 NOTE — Progress Notes (Signed)
Nutrition Assessment   Reason for Assessment: Taste changes   ASSESSMENT: 52 year old male with colorectal cancer. S/p LAR with ileostomy (08/23/22). He is receiving adjuvant chemotherapy with FOLFOX q14d. Patient is under the care of Dr. Burr Medico.  Past medical history includes HTN, aortic ectasia, CAD, GERD, DM2, AKI, HLD  Met with patient in infusion. Patient reports mild cold sensitivity lasting ~5 days following treatment. He has altered taste. Patient says the first couple bites are okay but quickly tastes like nothing. Patient is picking up on sweet and salty flavors. Patient reports eating a regular diet s/p ileostomy. He reports changing bag 6-8 times daily. States some of this is due to preference. Patient reports having 2-3 liquid bowel movements in the morning. This becomes more formed as the day goes. He is using imodium as needed. Patient typically does not eat until later in the day. Patient continues working in [employer] a few days a week. He has tried Ensure in the past and likes these. Patient states that these do not affect his blood sugars. Patient is monitoring daily.   Nutrition Focused Physical Exam: deferred    Medications: imodium, metformin, zofran, protonix, zanaflex, januvia   Labs: glucose 185, BUN 22, Cr 1.53   Anthropometrics:   Height: 6'3" Weight: 257 lb  UBW: 280 lb (Nov 23) BMI: 32.12    NUTRITION DIAGNOSIS: Unintended weight loss related to colorectal cancer s/p LAR with ileostomy as evidenced by 8% decrease from usual weight in 4 months - significant   INTERVENTION:  Educated on importance of adequate calorie and protein energy intake to maintain strength/weights during treatment Reviewed dietary recommendation s/p ileostomy (6-8 small meals/day) foods best tolerated, foods to limit. Discussed foods to aid with thickening of stool and importance of hydration with high output - handouts with list of foods, tips, + recipes for electrolyte solutions  provided Discussed strategies for altered taste, suggested baking soda salt water rinses before meals - handout with tips + recipe provided Recommend Ensure Plus/equivalent 2x/day for added calories/protein One complimentary case provided  Contact information given    MONITORING, EVALUATION, GOAL: Patient will tolerate increased calories and protein to minimize further wt loss during treatment    Next Visit: To be scheduled as needed

## 2022-11-08 NOTE — Patient Instructions (Signed)
[facility]  Discharge Instructions: Thank you for choosing [facility] to provide your oncology and hematology care.   If you have a lab appointment with the Cancer Center, please go directly to the Cancer Center and check in at the registration area.   Wear comfortable clothing and clothing appropriate for easy access to any Portacath or PICC line.   We strive to give you quality time with your provider. You may need to reschedule your appointment if you arrive late (15 or more minutes).  Arriving late affects you and other patients whose appointments are after yours.  Also, if you miss three or more appointments without notifying the office, you may be dismissed from the clinic at the provider's discretion.      For prescription refill requests, have your pharmacy contact our office and allow 72 hours for refills to be completed.    Today you received the following chemotherapy and/or immunotherapy agents: Oxaliplatin, Leucovorin, 5FU      To help prevent nausea and vomiting after your treatment, we encourage you to take your nausea medication as directed.  BELOW ARE SYMPTOMS THAT SHOULD BE REPORTED IMMEDIATELY: *FEVER GREATER THAN 100.4 F (38 C) OR HIGHER *CHILLS OR SWEATING *NAUSEA AND VOMITING THAT IS NOT CONTROLLED WITH YOUR NAUSEA MEDICATION *UNUSUAL SHORTNESS OF BREATH *UNUSUAL BRUISING OR BLEEDING *URINARY PROBLEMS (pain or burning when urinating, or frequent urination) *BOWEL PROBLEMS (unusual diarrhea, constipation, pain near the anus) TENDERNESS IN MOUTH AND THROAT WITH OR WITHOUT PRESENCE OF ULCERS (sore throat, sores in mouth, or a toothache) UNUSUAL RASH, SWELLING OR PAIN  UNUSUAL VAGINAL DISCHARGE OR ITCHING   Items with * indicate a potential emergency and should be followed up as soon as possible or go to the Emergency Department if any problems should occur.  Please show the CHEMOTHERAPY ALERT CARD or IMMUNOTHERAPY  ALERT CARD at check-in to the Emergency Department and triage nurse.  Should you have questions after your visit or need to cancel or reschedule your appointment, please contact [facility]  Dept: [phone]  and follow the prompts.  Office hours are 8:00 a.m. to 4:30 p.m. Monday - Friday. Please note that voicemails left after 4:00 p.m. may not be returned until the following business day.  We are closed weekends and major holidays. You have access to a nurse at all times for urgent questions. Please call the main number to the clinic Dept: [phone] and follow the prompts.   For any non-urgent questions, you may also contact your provider using MyChart. We now offer e-Visits for anyone 18 and older to request care online for non-urgent symptoms. For details visit [email or web address].   Also download the MyChart app! Go to the app store, search "MyChart", open the app, select [place], and log in with your MyChart username and password.   

## 2022-11-08 NOTE — Progress Notes (Signed)
Per Lacie NP, ok to tx with Scr 1.53.

## 2022-11-09 ENCOUNTER — Encounter: Payer: Self-pay | Admitting: Hematology

## 2022-11-10 ENCOUNTER — Inpatient Hospital Stay: Payer: BLUE CROSS/BLUE SHIELD

## 2022-11-10 VITALS — BP 126/88 | HR 62 | Temp 98.2°F | Resp 18

## 2022-11-10 DIAGNOSIS — C20 Malignant neoplasm of rectum: Secondary | ICD-10-CM

## 2022-11-10 DIAGNOSIS — Z5111 Encounter for antineoplastic chemotherapy: Secondary | ICD-10-CM | POA: Diagnosis not present

## 2022-11-10 DIAGNOSIS — Z95828 Presence of other vascular implants and grafts: Secondary | ICD-10-CM

## 2022-11-10 MED ORDER — SODIUM CHLORIDE 0.9 % IV SOLN: Freq: Once | INTRAVENOUS | Status: AC

## 2022-11-10 MED ORDER — SODIUM CHLORIDE 0.9% FLUSH
10.0000 mL | Freq: Once | INTRAVENOUS | Status: AC
  Administered 2022-11-10: 10 mL

## 2022-11-10 MED ORDER — HEPARIN SOD (PORK) LOCK FLUSH 100 UNIT/ML IV SOLN
500.0000 [IU] | Freq: Once | INTRAVENOUS | Status: AC
  Administered 2022-11-10: 500 [IU]

## 2022-11-10 NOTE — Patient Instructions (Signed)

## 2022-11-19 ENCOUNTER — Ambulatory Visit
Admission: RE | Admit: 2022-11-19 | Discharge: 2022-11-19 | Disposition: A | Discharge status: Home / Self Care | Payer: BLUE CROSS/BLUE SHIELD | Source: Ambulatory Visit | Attending: Surgery | Admitting: Surgery

## 2022-11-19 DIAGNOSIS — C20 Malignant neoplasm of rectum: Secondary | ICD-10-CM

## 2022-11-20 MED FILL — Dexamethasone Sodium Phosphate Inj 100 MG/10ML: INTRAMUSCULAR | Qty: 1 | Status: AC

## 2022-11-20 NOTE — Assessment & Plan Note (Signed)
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  Initial clinical staging T2N0M0.  -He underwent LAR and diverting ileostomy with Dr. Cliffton Asters on August 23, 2022.   -Case discussed in GI tumor board, we recommend adjuvant chemotherapy for 3 months, we feel the benefit of adjuvant radiation is probably small and okay to forgo if he gets chemo. -He was recently admitted for AKI on September 27, 2022, adjuvant chemotherapy was postponed. -he has received 3 cycles of FOLFOX, which he tolerated well overall

## 2022-11-21 ENCOUNTER — Inpatient Hospital Stay: Payer: BLUE CROSS/BLUE SHIELD | Attending: Nurse Practitioner | Admitting: Hematology

## 2022-11-21 ENCOUNTER — Encounter: Payer: Self-pay | Admitting: Hematology

## 2022-11-21 ENCOUNTER — Inpatient Hospital Stay: Payer: BLUE CROSS/BLUE SHIELD

## 2022-11-21 VITALS — BP 128/76 | HR 78 | Temp 98.5°F | Resp 18 | Ht 75.0 in | Wt 256.1 lb

## 2022-11-21 DIAGNOSIS — Z452 Encounter for adjustment and management of vascular access device: Secondary | ICD-10-CM | POA: Diagnosis not present

## 2022-11-21 DIAGNOSIS — N179 Acute kidney failure, unspecified: Secondary | ICD-10-CM | POA: Insufficient documentation

## 2022-11-21 DIAGNOSIS — R110 Nausea: Secondary | ICD-10-CM | POA: Diagnosis not present

## 2022-11-21 DIAGNOSIS — C20 Malignant neoplasm of rectum: Secondary | ICD-10-CM | POA: Diagnosis not present

## 2022-11-21 DIAGNOSIS — R197 Diarrhea, unspecified: Secondary | ICD-10-CM | POA: Insufficient documentation

## 2022-11-21 DIAGNOSIS — Z5111 Encounter for antineoplastic chemotherapy: Secondary | ICD-10-CM | POA: Diagnosis present

## 2022-11-21 DIAGNOSIS — E860 Dehydration: Secondary | ICD-10-CM | POA: Diagnosis not present

## 2022-11-21 DIAGNOSIS — Z95828 Presence of other vascular implants and grafts: Secondary | ICD-10-CM

## 2022-11-21 DIAGNOSIS — D630 Anemia in neoplastic disease: Secondary | ICD-10-CM | POA: Insufficient documentation

## 2022-11-21 LAB — CBC WITH DIFFERENTIAL (CANCER CENTER ONLY)
Abs Immature Granulocytes: 0.03 10*3/uL (ref 0.00–0.07)
Basophils Absolute: 0.1 10*3/uL (ref 0.0–0.1)
Basophils Relative: 1 %
Eosinophils Absolute: 0.2 10*3/uL (ref 0.0–0.5)
Eosinophils Relative: 2 %
HCT: 37.7 % — ABNORMAL LOW (ref 39.0–52.0)
Hemoglobin: 12.5 g/dL — ABNORMAL LOW (ref 13.0–17.0)
Immature Granulocytes: 1 %
Lymphocytes Relative: 23 %
Lymphs Abs: 1.5 10*3/uL (ref 0.7–4.0)
MCH: 26 pg (ref 26.0–34.0)
MCHC: 33.2 g/dL (ref 30.0–36.0)
MCV: 78.4 fL — ABNORMAL LOW (ref 80.0–100.0)
Monocytes Absolute: 0.7 10*3/uL (ref 0.1–1.0)
Monocytes Relative: 12 %
Neutro Abs: 3.8 10*3/uL (ref 1.7–7.7)
Neutrophils Relative %: 61 %
Platelet Count: 116 10*3/uL — ABNORMAL LOW (ref 150–400)
RBC: 4.81 MIL/uL (ref 4.22–5.81)
RDW: 20.1 % — ABNORMAL HIGH (ref 11.5–15.5)
WBC Count: 6.2 10*3/uL (ref 4.0–10.5)
nRBC: 0 % (ref 0.0–0.2)

## 2022-11-21 LAB — CMP (CANCER CENTER ONLY)
ALT: 22 U/L (ref 0–44)
AST: 26 U/L (ref 15–41)
Albumin: 4.4 g/dL (ref 3.5–5.0)
Alkaline Phosphatase: 81 U/L (ref 38–126)
Anion gap: 7 (ref 5–15)
BUN: 21 mg/dL — ABNORMAL HIGH (ref 6–20)
CO2: 24 mmol/L (ref 22–32)
Calcium: 9.8 mg/dL (ref 8.9–10.3)
Chloride: 104 mmol/L (ref 98–111)
Creatinine: 1.64 mg/dL — ABNORMAL HIGH (ref 0.61–1.24)
GFR, Estimated: 50 mL/min — ABNORMAL LOW (ref >60)
Glucose, Bld: 173 mg/dL — ABNORMAL HIGH (ref 70–99)
Potassium: 3.6 mmol/L (ref 3.5–5.1)
Sodium: 135 mmol/L (ref 135–145)
Total Bilirubin: 0.7 mg/dL (ref 0.3–1.2)
Total Protein: 7.5 g/dL (ref 6.5–8.1)

## 2022-11-21 MED ORDER — SODIUM CHLORIDE 0.9 % IV SOLN
2400.0000 mg/m2 | INTRAVENOUS | Status: DC
  Administered 2022-11-21: 6100 mg via INTRAVENOUS
  Filled 2022-11-21: qty 122

## 2022-11-21 MED ORDER — HEPARIN SOD (PORK) LOCK FLUSH 100 UNIT/ML IV SOLN: 500.0000 [IU] | Freq: Once | INTRAVENOUS | Status: DC | PRN

## 2022-11-21 MED ORDER — PALONOSETRON HCL INJECTION 0.25 MG/5ML
0.2500 mg | Freq: Once | INTRAVENOUS | Status: AC
  Administered 2022-11-21: 0.25 mg via INTRAVENOUS
  Filled 2022-11-21: qty 5

## 2022-11-21 MED ORDER — SODIUM CHLORIDE 0.9 % IV SOLN
10.0000 mg | Freq: Once | INTRAVENOUS | Status: AC
  Administered 2022-11-21: 10 mg via INTRAVENOUS
  Filled 2022-11-21: qty 10

## 2022-11-21 MED ORDER — LEUCOVORIN CALCIUM INJECTION 350 MG
400.0000 mg/m2 | Freq: Once | INTRAVENOUS | Status: AC
  Administered 2022-11-21: 1016 mg via INTRAVENOUS
  Filled 2022-11-21: qty 50.8

## 2022-11-21 MED ORDER — SODIUM CHLORIDE 0.9% FLUSH
10.0000 mL | Freq: Once | INTRAVENOUS | Status: AC
  Administered 2022-11-21: 10 mL

## 2022-11-21 MED ORDER — FLUOROURACIL CHEMO INJECTION 2.5 GM/50ML
400.0000 mg/m2 | Freq: Once | INTRAVENOUS | Status: AC
  Administered 2022-11-21: 1000 mg via INTRAVENOUS
  Filled 2022-11-21: qty 20

## 2022-11-21 MED ORDER — DEXTROSE 5 % IV SOLN: Freq: Once | INTRAVENOUS | Status: DC

## 2022-11-21 MED ORDER — OXALIPLATIN CHEMO INJECTION 100 MG/20ML
85.0000 mg/m2 | Freq: Once | INTRAVENOUS | Status: AC
  Administered 2022-11-21: 200 mg via INTRAVENOUS
  Filled 2022-11-21: qty 40

## 2022-11-21 MED ORDER — SODIUM CHLORIDE 0.9% FLUSH: 10.0000 mL | INTRAVENOUS | Status: DC | PRN

## 2022-11-21 MED ORDER — DEXTROSE 5 % IV SOLN: Freq: Once | INTRAVENOUS | Status: AC

## 2022-11-21 NOTE — Progress Notes (Signed)
Per Mosetta Putt MD, ok to treat today with SCR 1.64

## 2022-11-21 NOTE — Progress Notes (Signed)
[facility]   Telephone:(336) [phone] [phone]   Clinic Follow up Note   Patient Care Team: Bradd Canary, MD as PCP - General (Family Medicine) Christell Constant, MD as PCP - Cardiology (Cardiology) Malachy Mood, MD as Consulting Physician (Oncology) Pollyann Samples, NP as Nurse Practitioner (Oncology) Marily Lente, RN as Oncology Nurse Navigator (Oncology)  Date of Service:  11/21/2022  CHIEF COMPLAINT: f/u of rectal cancer   CURRENT THERAPY: Adjuvant chemo x 3 months, FOLFOX q2 weeks, starting 10/10/22    ASSESSMENT:  Robert [name] is a 52 y.o. male with   Rectal cancer (HCC) -Stage II, pT3N0M0, MSS -diagnosed in September 2023.  Initial clinical staging T2N0M0.  -He underwent LAR and diverting ileostomy with Dr. Cliffton Asters on August 23, 2022.   -Case discussed in GI tumor board, we recommend adjuvant chemotherapy for 3 months, we feel the benefit of adjuvant radiation is probably small and okay to forgo if he gets chemo. -He was recently admitted for AKI on September 27, 2022, adjuvant chemotherapy was postponed. -he has received 3 cycles of FOLFOX, which he tolerated well overall  -lab reviewed, adequate for treatment   PLAN: -Proceed with C4 FOLFOX today -lab reviewed -lab f/u in 2 weeks   SUMMARY OF ONCOLOGIC HISTORY: Oncology History  Rectal cancer  05/09/2022 Procedure   Colonoscopy by Dr. Russella Dar, impression - Five 5 to 7 mm polyps in the rectum, in the transverse colon and at the hepatic flexure, removed with a cold snare. Resected and retrieved. removed with a cold snare. Resected and retrieved. 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 Submucosal injection tattoo. - Internal hemorrhoids. - The examination was otherwise normal on direct and retroflexion views.   05/09/2022 Initial Biopsy   Diagnosis 1. Surgical [P], colon, transverse x1 and hepatic flexure x 3 (2 pieces only, 2 stuck together taken off together), polyp (4) -  TUBULAR ADENOMA(S) - NEGATIVE FOR HIGH-GRADE DYSPLASIA OR MALIGNANCY 2. Surgical [P], colon, rectal mass - INVASIVE MODERATELY DIFFERENTIATED ADENOCARCINOMA. SEE NOTE 3. Surgical [P], colon, rectum, polyp (1) - HYPERPLASTIC POLYP   05/09/2022 Tumor Marker   CEA < 2.0   05/17/2022 Imaging   IMPRESSION: 1. Known primary malignancy is not well visualized. Correlate with recent colonoscopy. 2. No evidence of metastatic disease in the chest, abdomen or pelvis. 3. Mildly enlarged bilateral inguinal lymph nodes, likely reactive. 4. 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 Recommend annual imaging followup by CTA or MRA. This recommendation follows 2010 ACCF/AHA/AATS/ACR/ASA/SCA/SCAI/SIR/STS/SVM Guidelines for the Diagnosis and Management of Patients with Thoracic Aortic Disease. Circulation. 2010; 121: T245-Y099. 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 Severe coronary artery calcifications of the LAD and circumflex. 6. Aortic Atherosclerosis (ICD10-I70.0).   05/23/2022 Initial Diagnosis   Rectal cancer (HCC)   08/23/2022 Cancer Staging   Staging form: Colon and Rectum, AJCC 8th Edition - Pathologic stage from 08/23/2022: Stage IIA (pT3, pN0, cM0) - Signed by Pollyann Samples, NP on 09/10/2022 Stage prefix: Initial diagnosis Total positive nodes: 0   08/23/2022 Surgery   PROCEDURE:  Robotic assisted low anterior resection with diverting loop ileostomy SURGEON: Stephanie Coup. Cliffton Asters, MD ASSISTANT: Romie Levee, MD   08/23/2022 Pathology Results   FINAL MICROSCOPIC DIAGNOSIS:  A. COLON, RECTOSIGMOID, RESECTION:  - Invasive moderately differentiated adenocarcinoma, 2.5 cm, involving rectum  - Carcinoma invades focally into the perirectal adipose tissue  - Resection margins are negative for carcinoma  - Negative for lymphovascular invasion  - Fourteen lymph nodes,  negative for carcinoma (0/14)  - See oncology table  B. DISTAL MARGIN DONUT:  - Rectal donut, negative for  carcinoma   Regional Lymph Nodes:       Number of Lymph Nodes with Tumor: 0       Number of Lymph Nodes Examined: 14  Pathologic Stage Classification (pTNM, AJCC 8th Edition): pT3, pN0  Mismatch Repair Protein (IHC)  SUMMARY INTERPRETATION: NORMAL  MSI-Stable   09/27/2022 - 09/27/2022 Chemotherapy   Patient is on Treatment Plan : RECTAL Xelox (Capeox) (130/850) q21d x 6 cycles     10/10/2022 -  Chemotherapy   Patient is on Treatment Plan : COLORECTAL FOLFOX q14d x 3 months        INTERVAL HISTORY:  Robert [name] is here for a follow up of rectal cancer. He was last seen by NP Lacie on 11/08/2022. He presents to the clinic alone. Pt state that last treatment was intense. Pt state of having pain lt leg , describe like a tender feeling or cramp. Pt rates the pain level at a 3. Pt state that he has some numbness and tingling but it tapers off after day 5.Pt denies having any diarrhea.        All other systems were reviewed with the patient and are negative.  MEDICAL HISTORY:  Past Medical History:  Diagnosis Date   Arthralgia 01/11/2013   Diffuse, b/l Hips R>L Knees, ankles, low back   Arthritis    Back pain 12/08/2015   Cancer    Coronary artery disease    Diabetes mellitus type 2 in obese 11/09/2012   Dyslipidemia 11/09/2012   Esophageal reflux 12/08/2015   Feeling grief 06/09/2020   History of migraine headaches    Hyperglycemia 11/09/2012   Hypertension     SURGICAL HISTORY: Past Surgical History:  Procedure Laterality Date   DIVERTING ILEOSTOMY N/A 08/23/2022   Procedure: DIVERTING LOOP ILEOSTOMY;  Surgeon: Andria Meuse, MD;  Location: [facility];  Service: General;  Laterality: N/A;   EUS N/A 06/21/2022   Procedure: LOWER ENDOSCOPIC ULTRASOUND (EUS);  Surgeon: Lemar Lofty., MD;  Location: [facility];  Service: Gastroenterology;  Laterality: N/A;   FLEXIBLE SIGMOIDOSCOPY N/A 06/21/2022   Procedure: FLEXIBLE SIGMOIDOSCOPY;  Surgeon: Meridee Score  Netty Starring., MD;  Location: [facility];  Service: Gastroenterology;  Laterality: N/A;   FLEXIBLE SIGMOIDOSCOPY N/A 08/23/2022   Procedure: FLEXIBLE SIGMOIDOSCOPY;  Surgeon: Andria Meuse, MD;  Location: [facility];  Service: General;  Laterality: N/A;   PORTACATH PLACEMENT N/A 09/21/2022   Procedure: INSERTION PORT-A-CATH WITH ULTRASOUND GUIDANCE;  Surgeon: Andria Meuse, MD;  Location: [facility];  Service: General;  Laterality: N/A;   XI ROBOTIC ASSISTED LOWER ANTERIOR RESECTION N/A 08/23/2022   Procedure: XI ROBOTIC ASSISTED LOWER ANTERIOR RESECTION with Introperative assessment of profusion and TAP Block;  Surgeon: Andria Meuse, MD;  Location: [facility];  Service: General;  Laterality: N/A;    I have reviewed the social history and family history with the patient and they are unchanged from previous note.  ALLERGIES:  is allergic to mucinex [guaifenesin er], nsaids, and latex.  MEDICATIONS:  Current Outpatient Medications  Medication Sig Dispense Refill   carvedilol (COREG) 25 MG tablet Take 1 tablet (25 mg total) by mouth 2 (two) times daily. 180 tablet 3   glucose blood (COOL BLOOD GLUCOSE TEST STRIPS) test strip Check blood sugar once daily 100 each 12   loperamide (IMODIUM) 2 MG capsule Take 1 capsule (2 mg total) by mouth 2 (  two) times daily. 30 capsule 0   metFORMIN (GLUCOPHAGE) 500 MG tablet Take 1 tablet (500 mg total) by mouth daily with breakfast. (Patient taking differently: Take 500 mg by mouth every evening.) 90 tablet 1   ondansetron (ZOFRAN) 4 MG tablet Take 1 tablet (4 mg total) by mouth every 6 (six) hours as needed for nausea. 20 tablet 0   pantoprazole (PROTONIX) 40 MG tablet Take 1 tablet (40 mg total) by mouth daily. 90 tablet 0   sitaGLIPtin (JANUVIA) 100 MG tablet Take 1 tablet (100 mg total) by mouth daily. 30 tablet 4   tiZANidine (ZANAFLEX) 2 MG tablet Take 0.5-2 tablets (1-4 mg total) by mouth 2 (two) times daily as needed for muscle spasms. (Patient  taking differently: Take 2-4 mg by mouth 3 (three) times daily as needed for muscle spasms.) 30 tablet 5   No current facility-administered medications for this visit.   Facility-Administered Medications Ordered in Other Visits  Medication Dose Route Frequency Provider Last Rate Last Admin   dextrose 5 % solution   Intravenous Once Malachy MoodFeng, Pierra Skora, MD       fluorouracil (ADRUCIL) 6,100 mg in sodium chloride 0.9 % 128 mL chemo infusion  2,400 mg/m2 (Treatment Plan Recorded) Intravenous 1 day or 1 dose Malachy MoodFeng, Kaid Seeberger, MD       fluorouracil (ADRUCIL) chemo injection 1,000 mg  400 mg/m2 (Treatment Plan Recorded) Intravenous Once Malachy MoodFeng, Zacari Radick, MD       heparin lock flush 100 unit/mL  500 Units Intracatheter Once PRN Malachy MoodFeng, Isamu Trammel, MD       leucovorin 1,016 mg in dextrose 5 % 250 mL infusion  400 mg/m2 (Treatment Plan Recorded) Intravenous Once Malachy MoodFeng, Jamonte Curfman, MD 150 mL/hr at 11/21/22 1254 1,016 mg at 11/21/22 1254   oxaliplatin (ELOXATIN) 200 mg in dextrose 5 % 500 mL chemo infusion  85 mg/m2 (Treatment Plan Recorded) Intravenous Once Malachy MoodFeng, Nellie Chevalier, MD 270 mL/hr at 11/21/22 1256 200 mg at 11/21/22 1256   sodium chloride flush (NS) 0.9 % injection 10 mL  10 mL Intracatheter PRN Malachy MoodFeng, Tabor Bartram, MD        PHYSICAL EXAMINATION: ECOG PERFORMANCE STATUS: 1 - Symptomatic but completely ambulatory  Vitals:   11/21/22 1104  BP: 128/76  Pulse: 78  Resp: 18  Temp: 98.5 F (36.9 C)  SpO2: 97%   Wt Readings from Last 3 Encounters:  11/21/22 256 lb 1.6 oz (116.2 kg)  11/08/22 257 lb (116.6 kg)  10/24/22 263 lb (119.3 kg)     GENERAL:alert, no distress and comfortable SKIN: skin color normal, no rashes or significant lesions EYES: normal, Conjunctiva are pink and non-injected, sclera clear  NEURO: alert & oriented x 3 with fluent speech  ABORATORY DATA:  I have reviewed the data as listed    Latest Ref Rng & Units 11/21/2022   10:49 AM 11/08/2022   10:39 AM 10/24/2022    9:13 AM  CBC  WBC 4.0 - 10.5 K/uL 6.2  5.7  6.0    Hemoglobin 13.0 - 17.0 g/dL 16.112.5  09.612.8  04.512.2   Hematocrit 39.0 - 52.0 % 37.7  37.8  36.5   Platelets 150 - 400 K/uL 116  129  156         Latest Ref Rng & Units 11/21/2022   10:49 AM 11/08/2022   10:39 AM 10/24/2022    9:13 AM  CMP  Glucose 70 - 99 mg/dL 409173  [id number]  [id number]   BUN 6 - 20 mg/dL 21  22  23  Creatinine 0.61 - 1.24 mg/dL 8.29  5.62  1.30   Sodium 135 - 145 mmol/L 135  137  137   Potassium 3.5 - 5.1 mmol/L 3.6  3.8  3.7   Chloride 98 - 111 mmol/L 104  104  104   CO2 22 - 32 mmol/L 24  27  26    Calcium 8.9 - 10.3 mg/dL 9.8  9.9  9.6   Total Protein 6.5 - 8.1 g/dL 7.5  7.6  7.2   Total Bilirubin 0.3 - 1.2 mg/dL 0.7  0.7  0.5   Alkaline Phos 38 - 126 U/L 81  83  72   AST 15 - 41 U/L 26  22  18    ALT 0 - 44 U/L 22  21  17        RADIOGRAPHIC STUDIES: I have personally reviewed the radiological images as listed and agreed with the findings in the report. No results found.    Orders Placed This Encounter  Procedures   CBC with Differential (Cancer Center Only)    Standing Status:   Future    Standing Expiration Date:   12/05/2023   CMP (Cancer Center only)    Standing Status:   Future    Standing Expiration Date:   12/05/2023   CBC with Differential (Cancer Center Only)    Standing Status:   Future    Standing Expiration Date:   12/19/2023   CMP (Cancer Center only)    Standing Status:   Future    Standing Expiration Date:   12/19/2023   All questions were answered. The patient knows to call the clinic with any problems, questions or concerns. No barriers to learning was detected. The total time spent in the appointment was 25 minutes.     Malachy Mood, MD 11/21/2022   Carolin Coy, CMA, am acting as scribe for Malachy Mood, MD.   I have reviewed the above documentation for accuracy and completeness, and I agree with the above.

## 2022-11-21 NOTE — Patient Instructions (Signed)
[facility]  Discharge Instructions: Thank you for choosing [facility] to provide your oncology and hematology care.   If you have a lab appointment with the Cancer Center, please go directly to the Cancer Center and check in at the registration area.   Wear comfortable clothing and clothing appropriate for easy access to any Portacath or PICC line.   We strive to give you quality time with your provider. You may need to reschedule your appointment if you arrive late (15 or more minutes).  Arriving late affects you and other patients whose appointments are after yours.  Also, if you miss three or more appointments without notifying the office, you may be dismissed from the clinic at the provider's discretion.      For prescription refill requests, have your pharmacy contact our office and allow 72 hours for refills to be completed.    Today you received the following chemotherapy and/or immunotherapy agents: Oxaliplatin, Leucovorin, Fluorouracil.       To help prevent nausea and vomiting after your treatment, we encourage you to take your nausea medication as directed.  BELOW ARE SYMPTOMS THAT SHOULD BE REPORTED IMMEDIATELY: *FEVER GREATER THAN 100.4 F (38 C) OR HIGHER *CHILLS OR SWEATING *NAUSEA AND VOMITING THAT IS NOT CONTROLLED WITH YOUR NAUSEA MEDICATION *UNUSUAL SHORTNESS OF BREATH *UNUSUAL BRUISING OR BLEEDING *URINARY PROBLEMS (pain or burning when urinating, or frequent urination) *BOWEL PROBLEMS (unusual diarrhea, constipation, pain near the anus) TENDERNESS IN MOUTH AND THROAT WITH OR WITHOUT PRESENCE OF ULCERS (sore throat, sores in mouth, or a toothache) UNUSUAL RASH, SWELLING OR PAIN  UNUSUAL VAGINAL DISCHARGE OR ITCHING   Items with * indicate a potential emergency and should be followed up as soon as possible or go to the Emergency Department if any problems should occur.  Please show the CHEMOTHERAPY ALERT CARD or  IMMUNOTHERAPY ALERT CARD at check-in to the Emergency Department and triage nurse.  Should you have questions after your visit or need to cancel or reschedule your appointment, please contact [facility]  Dept: [phone]  and follow the prompts.  Office hours are 8:00 a.m. to 4:30 p.m. Monday - Friday. Please note that voicemails left after 4:00 p.m. may not be returned until the following business day.  We are closed weekends and major holidays. You have access to a nurse at all times for urgent questions. Please call the main number to the clinic Dept: [phone] and follow the prompts.   For any non-urgent questions, you may also contact your provider using MyChart. We now offer e-Visits for anyone 18 and older to request care online for non-urgent symptoms. For details visit [email or web address].   Also download the MyChart app! Go to the app store, search "MyChart", open the app, select [facility], and log in with your MyChart username and password.   

## 2022-11-22 ENCOUNTER — Other Ambulatory Visit: Payer: Self-pay | Admitting: Family Medicine

## 2022-11-23 ENCOUNTER — Inpatient Hospital Stay: Payer: BLUE CROSS/BLUE SHIELD

## 2022-11-23 VITALS — BP 123/78 | HR 59 | Resp 16

## 2022-11-23 DIAGNOSIS — C20 Malignant neoplasm of rectum: Secondary | ICD-10-CM

## 2022-11-23 DIAGNOSIS — Z95828 Presence of other vascular implants and grafts: Secondary | ICD-10-CM

## 2022-11-23 DIAGNOSIS — Z5111 Encounter for antineoplastic chemotherapy: Secondary | ICD-10-CM | POA: Diagnosis not present

## 2022-11-23 MED ORDER — SODIUM CHLORIDE 0.9% FLUSH
10.0000 mL | INTRAVENOUS | Status: DC | PRN
  Administered 2022-11-23: 10 mL

## 2022-11-23 MED ORDER — HEPARIN SOD (PORK) LOCK FLUSH 100 UNIT/ML IV SOLN
500.0000 [IU] | Freq: Once | INTRAVENOUS | Status: AC | PRN
  Administered 2022-11-23: 500 [IU]

## 2022-11-23 MED ORDER — SODIUM CHLORIDE 0.9 % IV SOLN: Freq: Once | INTRAVENOUS | Status: AC

## 2022-11-23 NOTE — Patient Instructions (Signed)

## 2022-11-27 ENCOUNTER — Other Ambulatory Visit: Payer: Self-pay | Admitting: Family Medicine

## 2022-11-27 ENCOUNTER — Encounter: Payer: Self-pay | Admitting: Family Medicine

## 2022-11-27 DIAGNOSIS — E669 Obesity, unspecified: Secondary | ICD-10-CM

## 2022-11-27 DIAGNOSIS — E1169 Type 2 diabetes mellitus with other specified complication: Secondary | ICD-10-CM

## 2022-11-27 DIAGNOSIS — E785 Hyperlipidemia, unspecified: Secondary | ICD-10-CM

## 2022-11-27 DIAGNOSIS — I152 Hypertension secondary to endocrine disorders: Secondary | ICD-10-CM

## 2022-11-27 DIAGNOSIS — I10 Essential (primary) hypertension: Secondary | ICD-10-CM

## 2022-11-27 DIAGNOSIS — Z0000 Encounter for general adult medical examination without abnormal findings: Secondary | ICD-10-CM

## 2022-11-29 ENCOUNTER — Encounter: Payer: Self-pay | Admitting: Nurse Practitioner

## 2022-11-30 ENCOUNTER — Inpatient Hospital Stay (HOSPITAL_COMMUNITY)
Admission: AD | Admit: 2022-11-30 | Discharge: 2022-12-02 | DRG: 394 | Disposition: A | Discharge status: Home / Self Care | Payer: BLUE CROSS/BLUE SHIELD | Source: Ambulatory Visit | Attending: Internal Medicine | Admitting: Internal Medicine

## 2022-11-30 ENCOUNTER — Other Ambulatory Visit: Payer: Self-pay

## 2022-11-30 ENCOUNTER — Encounter (HOSPITAL_COMMUNITY): Payer: Self-pay | Admitting: Family Medicine

## 2022-11-30 ENCOUNTER — Inpatient Hospital Stay: Payer: BLUE CROSS/BLUE SHIELD

## 2022-11-30 ENCOUNTER — Telehealth: Payer: Self-pay

## 2022-11-30 ENCOUNTER — Encounter (HOSPITAL_COMMUNITY): Payer: Self-pay

## 2022-11-30 ENCOUNTER — Inpatient Hospital Stay (HOSPITAL_BASED_OUTPATIENT_CLINIC_OR_DEPARTMENT_OTHER): Payer: BLUE CROSS/BLUE SHIELD | Admitting: Physician Assistant

## 2022-11-30 VITALS — BP 117/90 | HR 80

## 2022-11-30 VITALS — BP 133/98 | HR 77 | Temp 98.1°F | Resp 18 | Ht 75.0 in | Wt 243.8 lb

## 2022-11-30 DIAGNOSIS — E860 Dehydration: Secondary | ICD-10-CM | POA: Diagnosis present

## 2022-11-30 DIAGNOSIS — N179 Acute kidney failure, unspecified: Secondary | ICD-10-CM | POA: Diagnosis not present

## 2022-11-30 DIAGNOSIS — C20 Malignant neoplasm of rectum: Secondary | ICD-10-CM

## 2022-11-30 DIAGNOSIS — Z79899 Other long term (current) drug therapy: Secondary | ICD-10-CM

## 2022-11-30 DIAGNOSIS — Z83438 Family history of other disorder of lipoprotein metabolism and other lipidemia: Secondary | ICD-10-CM

## 2022-11-30 DIAGNOSIS — I10 Essential (primary) hypertension: Secondary | ICD-10-CM | POA: Diagnosis present

## 2022-11-30 DIAGNOSIS — Z825 Family history of asthma and other chronic lower respiratory diseases: Secondary | ICD-10-CM

## 2022-11-30 DIAGNOSIS — Z95828 Presence of other vascular implants and grafts: Secondary | ICD-10-CM

## 2022-11-30 DIAGNOSIS — K219 Gastro-esophageal reflux disease without esophagitis: Secondary | ICD-10-CM | POA: Diagnosis present

## 2022-11-30 DIAGNOSIS — Z87891 Personal history of nicotine dependence: Secondary | ICD-10-CM

## 2022-11-30 DIAGNOSIS — E876 Hypokalemia: Secondary | ICD-10-CM | POA: Diagnosis present

## 2022-11-30 DIAGNOSIS — I2510 Atherosclerotic heart disease of native coronary artery without angina pectoris: Secondary | ICD-10-CM | POA: Diagnosis present

## 2022-11-30 DIAGNOSIS — Z932 Ileostomy status: Secondary | ICD-10-CM | POA: Diagnosis not present

## 2022-11-30 DIAGNOSIS — Z91040 Latex allergy status: Secondary | ICD-10-CM

## 2022-11-30 DIAGNOSIS — K9419 Other complications of enterostomy: Principal | ICD-10-CM | POA: Diagnosis present

## 2022-11-30 DIAGNOSIS — R197 Diarrhea, unspecified: Secondary | ICD-10-CM | POA: Diagnosis not present

## 2022-11-30 DIAGNOSIS — E1169 Type 2 diabetes mellitus with other specified complication: Secondary | ICD-10-CM | POA: Diagnosis present

## 2022-11-30 DIAGNOSIS — Z886 Allergy status to analgesic agent status: Secondary | ICD-10-CM

## 2022-11-30 DIAGNOSIS — Z888 Allergy status to other drugs, medicaments and biological substances status: Secondary | ICD-10-CM

## 2022-11-30 DIAGNOSIS — I77810 Thoracic aortic ectasia: Secondary | ICD-10-CM | POA: Diagnosis present

## 2022-11-30 DIAGNOSIS — Z833 Family history of diabetes mellitus: Secondary | ICD-10-CM

## 2022-11-30 DIAGNOSIS — Z789 Other specified health status: Secondary | ICD-10-CM | POA: Insufficient documentation

## 2022-11-30 DIAGNOSIS — R110 Nausea: Secondary | ICD-10-CM | POA: Diagnosis not present

## 2022-11-30 DIAGNOSIS — E785 Hyperlipidemia, unspecified: Secondary | ICD-10-CM | POA: Diagnosis present

## 2022-11-30 DIAGNOSIS — E871 Hypo-osmolality and hyponatremia: Secondary | ICD-10-CM | POA: Diagnosis present

## 2022-11-30 DIAGNOSIS — Z823 Family history of stroke: Secondary | ICD-10-CM

## 2022-11-30 DIAGNOSIS — Z8261 Family history of arthritis: Secondary | ICD-10-CM

## 2022-11-30 DIAGNOSIS — M62838 Other muscle spasm: Secondary | ICD-10-CM | POA: Diagnosis present

## 2022-11-30 DIAGNOSIS — E119 Type 2 diabetes mellitus without complications: Secondary | ICD-10-CM | POA: Diagnosis present

## 2022-11-30 DIAGNOSIS — E669 Obesity, unspecified: Secondary | ICD-10-CM | POA: Diagnosis present

## 2022-11-30 DIAGNOSIS — Z8249 Family history of ischemic heart disease and other diseases of the circulatory system: Secondary | ICD-10-CM

## 2022-11-30 DIAGNOSIS — Z7984 Long term (current) use of oral hypoglycemic drugs: Secondary | ICD-10-CM

## 2022-11-30 LAB — CBC WITH DIFFERENTIAL (CANCER CENTER ONLY)
Abs Immature Granulocytes: 0.05 10*3/uL (ref 0.00–0.07)
Basophils Absolute: 0 10*3/uL (ref 0.0–0.1)
Basophils Relative: 1 %
Eosinophils Absolute: 0.1 10*3/uL (ref 0.0–0.5)
Eosinophils Relative: 2 %
HCT: 41.8 % (ref 39.0–52.0)
Hemoglobin: 14.1 g/dL (ref 13.0–17.0)
Immature Granulocytes: 1 %
Lymphocytes Relative: 36 %
Lymphs Abs: 1.8 10*3/uL (ref 0.7–4.0)
MCH: 26.7 pg (ref 26.0–34.0)
MCHC: 33.7 g/dL (ref 30.0–36.0)
MCV: 79.2 fL — ABNORMAL LOW (ref 80.0–100.0)
Monocytes Absolute: 0.8 10*3/uL (ref 0.1–1.0)
Monocytes Relative: 17 %
Neutro Abs: 2.1 10*3/uL (ref 1.7–7.7)
Neutrophils Relative %: 43 %
Platelet Count: 198 10*3/uL (ref 150–400)
RBC: 5.28 MIL/uL (ref 4.22–5.81)
RDW: 19.9 % — ABNORMAL HIGH (ref 11.5–15.5)
WBC Count: 4.9 10*3/uL (ref 4.0–10.5)
nRBC: 0.6 % — ABNORMAL HIGH (ref 0.0–0.2)

## 2022-11-30 LAB — CMP (CANCER CENTER ONLY)
ALT: 25 U/L (ref 0–44)
AST: 36 U/L (ref 15–41)
Albumin: 4.6 g/dL (ref 3.5–5.0)
Alkaline Phosphatase: 85 U/L (ref 38–126)
Anion gap: 8 (ref 5–15)
BUN: 35 mg/dL — ABNORMAL HIGH (ref 6–20)
CO2: 25 mmol/L (ref 22–32)
Calcium: 10.4 mg/dL — ABNORMAL HIGH (ref 8.9–10.3)
Chloride: 100 mmol/L (ref 98–111)
Creatinine: 2.11 mg/dL — ABNORMAL HIGH (ref 0.61–1.24)
GFR, Estimated: 37 mL/min — ABNORMAL LOW (ref >60)
Glucose, Bld: 159 mg/dL — ABNORMAL HIGH (ref 70–99)
Potassium: 3.8 mmol/L (ref 3.5–5.1)
Sodium: 133 mmol/L — ABNORMAL LOW (ref 135–145)
Total Bilirubin: 0.8 mg/dL (ref 0.3–1.2)
Total Protein: 8 g/dL (ref 6.5–8.1)

## 2022-11-30 LAB — MAGNESIUM: Magnesium: 1.8 mg/dL (ref 1.7–2.4)

## 2022-11-30 LAB — C DIFFICILE QUICK SCREEN W PCR REFLEX
C Diff antigen: NEGATIVE
C Diff interpretation: NOT DETECTED
C Diff toxin: NEGATIVE

## 2022-11-30 LAB — GLUCOSE, CAPILLARY
Glucose-Capillary: 148 mg/dL — ABNORMAL HIGH (ref 70–99)
Glucose-Capillary: 157 mg/dL — ABNORMAL HIGH (ref 70–99)

## 2022-11-30 LAB — SAMPLE TO BLOOD BANK

## 2022-11-30 MED ORDER — SODIUM CHLORIDE 0.9% FLUSH
10.0000 mL | Freq: Once | INTRAVENOUS | Status: AC
  Administered 2022-11-30: 10 mL

## 2022-11-30 MED ORDER — ACETAMINOPHEN 325 MG PO TABS: 650.0000 mg | ORAL_TABLET | Freq: Four times a day (QID) | ORAL | Status: DC | PRN

## 2022-11-30 MED ORDER — ONDANSETRON HCL 4 MG PO TABS
4.0000 mg | ORAL_TABLET | Freq: Four times a day (QID) | ORAL | Status: DC | PRN
  Administered 2022-12-01 – 2022-12-02 (×2): 4 mg via ORAL
  Filled 2022-11-30 (×2): qty 1

## 2022-11-30 MED ORDER — TIZANIDINE HCL 4 MG PO TABS: 2.0000 mg | ORAL_TABLET | Freq: Three times a day (TID) | ORAL | Status: DC | PRN

## 2022-11-30 MED ORDER — SODIUM CHLORIDE 0.9 % IV SOLN: Freq: Once | INTRAVENOUS | Status: AC

## 2022-11-30 MED ORDER — PROCHLORPERAZINE MALEATE 10 MG PO TABS
10.0000 mg | ORAL_TABLET | Freq: Four times a day (QID) | ORAL | Status: DC | PRN
  Administered 2022-12-01 – 2022-12-02 (×2): 10 mg via ORAL
  Filled 2022-11-30 (×2): qty 1

## 2022-11-30 MED ORDER — CARVEDILOL 25 MG PO TABS
25.0000 mg | ORAL_TABLET | Freq: Two times a day (BID) | ORAL | Status: DC
  Administered 2022-11-30 – 2022-12-02 (×4): 25 mg via ORAL
  Filled 2022-11-30 (×4): qty 1

## 2022-11-30 MED ORDER — LOPERAMIDE HCL 2 MG PO CAPS
2.0000 mg | ORAL_CAPSULE | ORAL | Status: DC | PRN
  Administered 2022-12-01 – 2022-12-02 (×2): 2 mg via ORAL
  Filled 2022-11-30 (×2): qty 1

## 2022-11-30 MED ORDER — LIDOCAINE-PRILOCAINE 2.5-2.5 % EX CREA: 1.0000 | TOPICAL_CREAM | CUTANEOUS | Status: DC | PRN

## 2022-11-30 MED ORDER — LACTATED RINGERS IV BOLUS
1000.0000 mL | Freq: Once | INTRAVENOUS | Status: AC
  Administered 2022-11-30: 1000 mL via INTRAVENOUS

## 2022-11-30 MED ORDER — OXYCODONE HCL 5 MG PO TABS: 5.0000 mg | ORAL_TABLET | ORAL | Status: DC | PRN

## 2022-11-30 MED ORDER — SODIUM CHLORIDE 0.9% FLUSH: 10.0000 mL | Freq: Once | INTRAVENOUS | Status: DC

## 2022-11-30 MED ORDER — ONDANSETRON HCL 4 MG/2ML IJ SOLN
4.0000 mg | Freq: Four times a day (QID) | INTRAMUSCULAR | Status: DC | PRN
  Administered 2022-12-01: 4 mg via INTRAVENOUS
  Filled 2022-11-30 (×2): qty 2

## 2022-11-30 MED ORDER — SODIUM CHLORIDE 0.9% FLUSH
3.0000 mL | Freq: Two times a day (BID) | INTRAVENOUS | Status: DC
  Administered 2022-11-30 – 2022-12-02 (×4): 3 mL via INTRAVENOUS

## 2022-11-30 MED ORDER — SODIUM CHLORIDE 0.9 % IV SOLN
25.0000 mg | Freq: Once | INTRAVENOUS | Status: AC
  Administered 2022-11-30: 25 mg via INTRAVENOUS
  Filled 2022-11-30: qty 1

## 2022-11-30 MED ORDER — INSULIN ASPART 100 UNIT/ML IJ SOLN
0.0000 [IU] | Freq: Three times a day (TID) | INTRAMUSCULAR | Status: DC
  Administered 2022-12-01: 2 [IU] via SUBCUTANEOUS
  Administered 2022-12-02: 1 [IU] via SUBCUTANEOUS
  Administered 2022-12-02: 2 [IU] via SUBCUTANEOUS

## 2022-11-30 MED ORDER — PANTOPRAZOLE SODIUM 40 MG PO TBEC
40.0000 mg | DELAYED_RELEASE_TABLET | Freq: Every day | ORAL | Status: DC
  Administered 2022-12-01 – 2022-12-02 (×2): 40 mg via ORAL
  Filled 2022-11-30 (×2): qty 1

## 2022-11-30 MED ORDER — ENOXAPARIN SODIUM 40 MG/0.4ML IJ SOSY
40.0000 mg | PREFILLED_SYRINGE | INTRAMUSCULAR | Status: DC
  Administered 2022-11-30 – 2022-12-01 (×2): 40 mg via SUBCUTANEOUS
  Filled 2022-11-30 (×2): qty 0.4

## 2022-11-30 MED ORDER — TRAZODONE HCL 50 MG PO TABS: 50.0000 mg | ORAL_TABLET | Freq: Every evening | ORAL | Status: DC | PRN

## 2022-11-30 MED ORDER — HEPARIN SOD (PORK) LOCK FLUSH 100 UNIT/ML IV SOLN: 500.0000 [IU] | Freq: Once | INTRAVENOUS | Status: DC

## 2022-11-30 MED ORDER — LACTATED RINGERS IV SOLN: INTRAVENOUS | Status: DC

## 2022-11-30 MED ORDER — ACETAMINOPHEN 650 MG RE SUPP: 650.0000 mg | Freq: Four times a day (QID) | RECTAL | Status: DC | PRN

## 2022-11-30 NOTE — Assessment & Plan Note (Addendum)
No clear inciting factor, may be dumping due to ileostomy, GI panel is also pending.  Status post 1 L IV fluids, normal EF on most recent echo. - Give additional 1 L LR bolus, after that run fluids at 125 cc an hour of LR - Follow-up GI panel PCR and C. difficile testing - Continue loperamide as needed to help control ostomy output - Trend BMP

## 2022-11-30 NOTE — Progress Notes (Signed)
Symptom Management Consult Note [facility]    Patient Care Team: Bradd Canary, MD as PCP - General (Family Medicine) Christell Constant, MD as PCP - Cardiology (Cardiology) Malachy Mood, MD as Consulting Physician (Oncology) Pollyann Samples, NP as Nurse Practitioner (Oncology) Marily Lente, RN as Oncology Nurse Navigator (Oncology)    Name / MRN / DOB: Robert [name]  [id number]  1971-03-30   Date of visit: 11/30/2022   Chief Complaint/Reason for visit: nausea   Current Therapy: FOLFOX q2   Last treatment:  Day 1   Cycle 4 on 11/21/22   ASSESSMENT & PLAN: Patient is a 52 y.o. male  with oncologic history of stage II rectal cancer followed by Dr. Mosetta Putt.  I have viewed most recent oncology note and lab work.    #stage II rectal cancer  - Next appointment with oncologist is 12/06/22  #Dehydration - Patient is weak appearing, clinically appears dehydrated. He is down 13 pounds in in 9 days. Abdominal exam is benign. Normal neuro exam. HDS. - CBC overall unremarkable. CMP showing AKI, 2.11 with baseline around 1.2 Needs admission for AKI requiring IV hydration as he is not tolerating PO intake at home. Given 1L of IVF in clinic and IV phenergan. Dr. Mosetta Putt agrees with plan for admission. - Will send GI PCR panel and C diff testing today.  Spoke with Dr. Renford Dills with hospitalist service who agrees to assume care of patient and bring into the hospital for further evaluation and management.      Heme/Onc History: Oncology History  Rectal cancer  05/09/2022 Procedure   Colonoscopy by Dr. Russella Dar, impression - Five 5 to 7 mm polyps in the rectum, in the transverse colon and at the hepatic flexure, removed with a cold snare. Resected and retrieved. removed with a cold snare. Resected and retrieved. 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 Submucosal injection tattoo. - Internal hemorrhoids. - The examination was otherwise normal on direct and  retroflexion views.   05/09/2022 Initial Biopsy   Diagnosis 1. Surgical [P], colon, transverse x1 and hepatic flexure x 3 (2 pieces only, 2 stuck together taken off together), polyp (4) - TUBULAR ADENOMA(S) - NEGATIVE FOR HIGH-GRADE DYSPLASIA OR MALIGNANCY 2. Surgical [P], colon, rectal mass - INVASIVE MODERATELY DIFFERENTIATED ADENOCARCINOMA. SEE NOTE 3. Surgical [P], colon, rectum, polyp (1) - HYPERPLASTIC POLYP   05/09/2022 Tumor Marker   CEA < 2.0   05/17/2022 Imaging   IMPRESSION: 1. Known primary malignancy is not well visualized. Correlate with recent colonoscopy. 2. No evidence of metastatic disease in the chest, abdomen or pelvis. 3. Mildly enlarged bilateral inguinal lymph nodes, likely reactive. 4. 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 Recommend annual imaging followup by CTA or MRA. This recommendation follows 2010 ACCF/AHA/AATS/ACR/ASA/SCA/SCAI/SIR/STS/SVM Guidelines for the Diagnosis and Management of Patients with Thoracic Aortic Disease. Circulation. 2010; 121: M841-L244. 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 Severe coronary artery calcifications of the LAD and circumflex. 6. Aortic Atherosclerosis (ICD10-I70.0).   05/23/2022 Initial Diagnosis   Rectal cancer (HCC)   08/23/2022 Cancer Staging   Staging form: Colon and Rectum, AJCC 8th Edition - Pathologic stage from 08/23/2022: Stage IIA (pT3, pN0, cM0) - Signed by Pollyann Samples, NP on 09/10/2022 Stage prefix: Initial diagnosis Total positive nodes: 0   08/23/2022 Surgery   PROCEDURE:  Robotic assisted low anterior resection with diverting loop ileostomy SURGEON: Stephanie Coup. Cliffton Asters, MD ASSISTANT: Romie Levee, MD   08/23/2022 Pathology Results  FINAL MICROSCOPIC DIAGNOSIS:  A. COLON, RECTOSIGMOID, RESECTION:  - Invasive moderately differentiated adenocarcinoma, 2.5 cm, involving rectum  - Carcinoma invades focally into the perirectal adipose tissue  - Resection margins are negative for  carcinoma  - Negative for lymphovascular invasion  - Fourteen lymph nodes, negative for carcinoma (0/14)  - See oncology table  B. DISTAL MARGIN DONUT:  - Rectal donut, negative for carcinoma   Regional Lymph Nodes:       Number of Lymph Nodes with Tumor: 0       Number of Lymph Nodes Examined: 14  Pathologic Stage Classification (pTNM, AJCC 8th Edition): pT3, pN0  Mismatch Repair Protein (IHC)  SUMMARY INTERPRETATION: NORMAL  MSI-Stable   09/27/2022 - 09/27/2022 Chemotherapy   Patient is on Treatment Plan : RECTAL Xelox (Capeox) (130/850) q21d x 6 cycles     10/10/2022 -  Chemotherapy   Patient is on Treatment Plan : COLORECTAL FOLFOX q14d x 3 months         Interval history-: Robert [name] is a 52 y.o. male with oncologic history as above presenting to [facility] today with chief complaint of diarrhea and dizziness.  He presents unaccompanied to clinic.  Patient having diarrhea x 2 days.  It is not uncommon for him to have diarrhea after treatment however he it usually resolves around day 5.  He had to change his ostomy bag 10 times yesterday because it was full. He took imodium yesterday. He describes the diarrhea as liquid.  He denies any associated abdominal pain.  He does have severe nausea that was not improving with Compazine or Zofran, took both of them this morning.  Patient states he had significantly decreased p.o. intake yesterday because of his ongoing symptoms.  He reports his taste is altered and he felt like he had a thick film in his mouth.  He has not had any fevers.  Patient denies any urinary symptoms, states his urine is still pale yellow in color.  Patient also reports feeling dizzy.  When he woke up around 5 AM this morning to be his bag he had room spinning sensation and had to sit down to that he did not fall.  He admits to feeling lightheaded before but this was more severe.  Patient denies any headache or visual changes.    ROS  All other systems are reviewed and are  negative for acute change except as noted in the HPI.    Allergies  Allergen Reactions   Mucinex [Guaifenesin Er] Shortness Of Breath, Itching, Swelling, Dermatitis and Rash    Swelling of hands and feet   Nsaids Shortness Of Breath, Itching, Swelling and Rash    Aleve and Advil    Latex Rash and Other (See Comments)    Cartilage will harden      Past Medical History:  Diagnosis Date   Arthralgia 01/11/2013   Diffuse, b/l Hips R>L Knees, ankles, low back   Arthritis    Back pain 12/08/2015   Cancer    Coronary artery disease    Diabetes mellitus type 2 in obese 11/09/2012   Dyslipidemia 11/09/2012   Esophageal reflux 12/08/2015   Feeling grief 06/09/2020   History of migraine headaches    Hyperglycemia 11/09/2012   Hypertension      Past Surgical History:  Procedure Laterality Date   DIVERTING ILEOSTOMY N/A 08/23/2022   Procedure: DIVERTING LOOP ILEOSTOMY;  Surgeon: Andria Meuse, MD;  Location: [facility];  Service: General;  Laterality: N/A;   EUS  N/A 06/21/2022   Procedure: LOWER ENDOSCOPIC ULTRASOUND (EUS);  Surgeon: Lemar Lofty., MD;  Location: [facility];  Service: Gastroenterology;  Laterality: N/A;   FLEXIBLE SIGMOIDOSCOPY N/A 06/21/2022   Procedure: FLEXIBLE SIGMOIDOSCOPY;  Surgeon: Meridee Score Netty Starring., MD;  Location: [facility];  Service: Gastroenterology;  Laterality: N/A;   FLEXIBLE SIGMOIDOSCOPY N/A 08/23/2022   Procedure: FLEXIBLE SIGMOIDOSCOPY;  Surgeon: Andria Meuse, MD;  Location: [facility];  Service: General;  Laterality: N/A;   PORTACATH PLACEMENT N/A 09/21/2022   Procedure: INSERTION PORT-A-CATH WITH ULTRASOUND GUIDANCE;  Surgeon: Andria Meuse, MD;  Location: [facility];  Service: General;  Laterality: N/A;   XI ROBOTIC ASSISTED LOWER ANTERIOR RESECTION N/A 08/23/2022   Procedure: XI ROBOTIC ASSISTED LOWER ANTERIOR RESECTION with Introperative assessment of profusion and TAP Block;  Surgeon: Andria Meuse, MD;   Location: [facility];  Service: General;  Laterality: N/A;    Social History   Socioeconomic History   Marital status: Married    Spouse name: Not on file   Number of children: 4   Years of education: Not on file   Highest education level: Not on file  Occupational History   Occupation: Programmer, multimedia    Employer: [employer]  Tobacco Use   Smoking status: Former    Packs/day: 1.50    Years: 10.00    Additional pack years: 0.00    Total pack years: 15.00    Types: Cigarettes    Quit date: 08/14/1995    Years since quitting: 27.3   Smokeless tobacco: Never  Vaping Use   Vaping Use: Never used  Substance and Sexual Activity   Alcohol use: Yes    Alcohol/week: 6.0 - 10.0 standard drinks of alcohol    Types: 6 - 10 Cans of beer per week    Comment: 2/day, more in the past   Drug use: Yes    Types: Marijuana    Comment: marijuana 12/11   Sexual activity: Yes    Comment: lives with wife and daughter, no dietary restrictions  Other Topics Concern   Not on file  Social History Narrative   Regular exercise:  Walks 1-2 x weekly   Caffeine use:  3-4 daily   4 children- oldest first marriage 54 (son), 54 son, 10 son, 58 yr old girl   Married   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[employer] Strain: Not on file  Food Insecurity: No Food Insecurity (09/27/2022)   Hunger Vital Sign    Worried About Running Out of Food in the Last Year: Never true    Ran Out of Food in the Last Year: Never true  Transportation Needs: No Transportation Needs (09/27/2022)   PRAPARE - Administrator, Civil Service (Medical): No    Lack of Transportation (Non-Medical): No  Physical Activity: Not on file  Stress: Not on file  Social Connections: Not on file  Intimate Partner Violence: Not At Risk (09/27/2022)   Humiliation, Afraid, Rape, and Kick questionnaire    Fear of Current or Ex-Partner: No    Emotionally Abused: No     Physically Abused: No    Sexually Abused: No    Family History  Problem Relation Age of Onset   Arthritis Mother    Hypertension Mother    Diabetes Mother    Cancer Mother    Arthritis Father  Hypertension Father    Diabetes Father    Sleep apnea Father    Obesity Father    Arthritis Maternal Grandmother    Diabetes Maternal Grandmother    Stroke Maternal Grandmother    Arthritis Paternal Grandmother    Diabetes Paternal Grandmother    Cancer Paternal Grandmother        lung, smoker   Diabetes Paternal Grandfather    Heart disease Paternal Grandfather 92       MI   Hyperlipidemia Brother    Hypertension Brother    Diabetes Brother    COPD Maternal Grandfather    Kidney disease Neg Hx      Current Outpatient Medications:    carvedilol (COREG) 25 MG tablet, Take 1 tablet (25 mg total) by mouth 2 (two) times daily., Disp: 180 tablet, Rfl: 3   glucose blood (COOL BLOOD GLUCOSE TEST STRIPS) test strip, Check blood sugar once daily, Disp: 100 each, Rfl: 12   loperamide (IMODIUM) 2 MG capsule, Take 1 capsule (2 mg total) by mouth 2 (two) times daily., Disp: 30 capsule, Rfl: 0   metFORMIN (GLUCOPHAGE) 500 MG tablet, Take 1 tablet (500 mg total) by mouth daily with breakfast. (Patient taking differently: Take 500 mg by mouth every evening.), Disp: 90 tablet, Rfl: 1   ondansetron (ZOFRAN) 4 MG tablet, Take 1 tablet (4 mg total) by mouth every 6 (six) hours as needed for nausea., Disp: 20 tablet, Rfl: 0   pantoprazole (PROTONIX) 40 MG tablet, Take 1 tablet (40 mg total) by mouth daily., Disp: 90 tablet, Rfl: 1   sitaGLIPtin (JANUVIA) 100 MG tablet, Take 1 tablet (100 mg total) by mouth daily., Disp: 30 tablet, Rfl: 4   tiZANidine (ZANAFLEX) 2 MG tablet, Take 0.5-2 tablets (1-4 mg total) by mouth 2 (two) times daily as needed for muscle spasms. (Patient taking differently: Take 2-4 mg by mouth 3 (three) times daily as needed for muscle spasms.), Disp: 30 tablet, Rfl: 5 No current  facility-administered medications for this visit.  Facility-Administered Medications Ordered in Other Visits:    heparin lock flush 100 unit/mL, 500 Units, Intracatheter, Once, Malachy Mood, MD   sodium chloride flush (NS) 0.9 % injection 10 mL, 10 mL, Intracatheter, Once, Malachy Mood, MD  PHYSICAL EXAM: ECOG FS:1 - Symptomatic but completely ambulatory    Vitals:   11/30/22 1218 11/30/22 1239  BP: (!) 139/95 (!) 133/98  Pulse: 77 77  Resp: 18   Temp: 98.1 F (36.7 C)   TempSrc: Oral   SpO2: 98%   Weight: 243 lb 12.8 oz (110.6 kg)   Height: 6\' 3"  (1.905 m)    Physical Exam Vitals and nursing note reviewed.  Constitutional:      Appearance: He is not ill-appearing or toxic-appearing.     Comments: Frail appearing  HENT:     Head: Normocephalic.     Mouth/Throat:     Mouth: Mucous membranes are dry.     Comments: No signs of mucositis  Eyes:     Conjunctiva/sclera: Conjunctivae normal.  Cardiovascular:     Rate and Rhythm: Normal rate and regular rhythm.     Pulses: Normal pulses.     Heart sounds: Normal heart sounds.  Pulmonary:     Effort: Pulmonary effort is normal.     Breath sounds: Normal breath sounds.  Abdominal:     General: Bowel sounds are normal. There is no distension.     Palpations: Abdomen is soft.     Tenderness: There is  no abdominal tenderness.     Comments: Ostomy bag with yellow liquid stool  Musculoskeletal:     Cervical back: Normal range of motion.  Skin:    General: Skin is warm and dry.  Neurological:     General: No focal deficit present.     Mental Status: He is alert and oriented to person, place, and time.     Comments: Speech is clear and goal oriented, follows commands CN III-XII intact, no facial droop Normal strength in upper and lower extremities bilaterally including dorsiflexion and plantar flexion, strong and equal grip strength Sensation normal to light and sharp touch Moves extremities without ataxia, coordination  intact Normal finger to nose and rapid alternating movements Normal gait and balance          LABORATORY DATA: I have reviewed the data as listed    Latest Ref Rng & Units 11/30/2022   11:45 AM 11/21/2022   10:49 AM 11/08/2022   10:39 AM  CBC  WBC 4.0 - 10.5 K/uL 4.9  6.2  5.7   Hemoglobin 13.0 - 17.0 g/dL 16.1  09.6  04.5   Hematocrit 39.0 - 52.0 % 41.8  37.7  37.8   Platelets 150 - 400 K/uL 198  116  129         Latest Ref Rng & Units 11/30/2022   11:45 AM 11/21/2022   10:49 AM 11/08/2022   10:39 AM  CMP  Glucose 70 - 99 mg/dL 409  811  914   BUN 6 - 20 mg/dL 35  21  22   Creatinine 0.61 - 1.24 mg/dL 7.82  9.56  2.13   Sodium 135 - 145 mmol/L 133  135  137   Potassium 3.5 - 5.1 mmol/L 3.8  3.6  3.8   Chloride 98 - 111 mmol/L 100  104  104   CO2 22 - 32 mmol/L Calcium 8.9 - 10.3 mg/dL 08.6  9.8  9.9   Total Protein 6.5 - 8.1 g/dL 8.0  7.5  7.6   Total Bilirubin 0.3 - 1.2 mg/dL 0.8  0.7  0.7   Alkaline Phos 38 - 126 U/L 85  81  83   AST 15 - 41 U/L 36  26  22   ALT 0 - 44 U/L RADIOGRAPHIC STUDIES (from last 24 hours if applicable) I have personally reviewed the radiological images as listed and agreed with the findings in the report. No results found.      Visit Diagnosis: 1. Rectal cancer   2. Diarrhea, unspecified type   3. AKI (acute kidney injury)   4. Nausea without vomiting      No orders of the defined types were placed in this encounter.   All questions were answered. The patient knows to call the clinic with any problems, questions or concerns. No barriers to learning was detected.  I have spent a total of 30 minutes minutes of face-to-face and non-face-to-face time, preparing to see the patient, obtaining and/or reviewing separately obtained history, performing a medically appropriate examination, counseling and educating the patient, ordering tests, documenting clinical information in the electronic health  record, and care coordination (communications with other health care professionals or caregivers).    Thank you for allowing me to participate in the care of this patient.    Shanon Ace, PA-C Department of Hematology/Oncology [facility] at  [facility] Phone: [phone]  [phone]    11/30/2022 2:05 PM

## 2022-11-30 NOTE — Assessment & Plan Note (Addendum)
Hypertension-continue carvedilol 25 mg twice daily DM2-discontinue home metformin and Januvia, sensitive sliding scale insulin correction factor GERD-continue PPI Muscle spasm-continue tizanidine as needed

## 2022-11-30 NOTE — Telephone Encounter (Signed)
Pt called stating he's experiencing severe nausea and dizziness.  Pt stated he's taking Compazine as prescribed but it does not seem to be working.  Pt denied taking the Ondansetron.  Asked pt to verbalized how he was instructed to take the prescribed antiemetics when in Chemo Education Class.  Pt stated he was told to take 1 of the antiemetics and if it does not work within 2 hr take the other prescribed antiemetic.  Pt stated he was told to stagger taking the antiemetics until the nausea is resolved.  Pt verbalized the instructions correctly but was not taking the antiemetics as instructed.  Reinforced teaching done by this RN on how to take his antiemetics.  Pt denied vomiting and fever.  Pt stated he has an ostomy and had liquid watery stools x1 day which has resolved with Imodium.  Pt stated he's drinking plenty of fluids.  Pt stated he's drinking 1 gallon of water as well as drinking Body Armor and Powerade.  Instructed the pt to drink low sugar Liquid IV several days prior to his chemo infusion day and the next several days after his chemo to help with rehydrating.  Pt verbalized understanding of instructions.  Pt stated his cold sensitivity is lasting longer than before but not causing him much problems.  Pt denied neuropathy.  Pt stated he's lost 5lbs since his chemo infusion d/t loss of appetite and nausea.  This RN spoke with [facility] to see if pt could be seen today for rehydration and IV antiemetics.  Pt will be seen today in [facility] at 11:30am.  Contacted pt to inform pt of appt today in [facility].  Sent a scheduling message to Lynda Rainwater to get pt scheduled for IVF w/every Pump d/c appt in infusion.

## 2022-11-30 NOTE — H&P (Signed)
History and Physical    Patient: Robert [name] DOB: 09/19/1970 DOA: 11/30/2022 DOS: the patient was seen and examined on 11/30/2022 PCP: Bradd Canary, MD  Patient coming from: Home  Chief Complaint: No chief complaint on file.  HPI: Robert [name] is a 52 y.o. 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  Called his oncology office earlier today reporting nausea, dizziness, and significant output from his ileostomy.  He was seen in clinic earlier today and appeared weak and clinically dehydrated.  CMP was obtained which showed AKI with creatinine of 2.1 and his reported baseline closer to 1.2, he was given 1 L of IV fluids as well as IV Phenergan and sent for admission.  In addition GI PCR panel and C. difficile testing were obtained prior to sending him to the hospital.  On my interview patient reports that overall he feels a bit weak and unwell.  He reports that yesterday he had a constant watery output from his ostomy that is unusual for him but this has abated today.  He has no sick contacts, has been eating and drinking as best he can though this is limited by his nausea.   Review of Systems: As mentioned in the history of present illness. All other systems reviewed and are negative. Past Medical History:  Diagnosis Date   Arthralgia 01/11/2013   Diffuse, b/l Hips R>L Knees, ankles, low back   Arthritis    Back pain 12/08/2015   Cancer    Coronary artery disease    Diabetes mellitus type 2 in obese 11/09/2012   Dyslipidemia 11/09/2012   Esophageal reflux 12/08/2015   Feeling grief 06/09/2020   History of migraine headaches    Hyperglycemia 11/09/2012   Hypertension    Past Surgical History:   Procedure Laterality Date   DIVERTING ILEOSTOMY N/A 08/23/2022   Procedure: DIVERTING LOOP ILEOSTOMY;  Surgeon: Andria Meuse, MD;  Location: [facility];  Service: General;  Laterality: N/A;   EUS N/A 06/21/2022   Procedure: LOWER ENDOSCOPIC ULTRASOUND (EUS);  Surgeon: Lemar Lofty., MD;  Location: [facility];  Service: Gastroenterology;  Laterality: N/A;   FLEXIBLE SIGMOIDOSCOPY N/A 06/21/2022   Procedure: FLEXIBLE SIGMOIDOSCOPY;  Surgeon: Meridee Score Netty Starring., MD;  Location: [facility];  Service: Gastroenterology;  Laterality: N/A;   FLEXIBLE SIGMOIDOSCOPY N/A 08/23/2022   Procedure: FLEXIBLE SIGMOIDOSCOPY;  Surgeon: Andria Meuse, MD;  Location: [facility];  Service: General;  Laterality: N/A;   PORTACATH PLACEMENT N/A 09/21/2022   Procedure: INSERTION PORT-A-CATH WITH ULTRASOUND GUIDANCE;  Surgeon: Andria Meuse, MD;  Location: [facility];  Service: General;  Laterality: N/A;   XI ROBOTIC ASSISTED LOWER ANTERIOR RESECTION N/A 08/23/2022   Procedure: XI ROBOTIC ASSISTED LOWER ANTERIOR RESECTION with Introperative assessment of profusion and TAP Block;  Surgeon: Andria Meuse, MD;  Location: [facility];  Service: General;  Laterality: N/A;   Social History:  reports that he quit smoking about 27 years ago. His smoking use included cigarettes. He has a 15.00 pack-year smoking history. He has never used smokeless tobacco. He reports current alcohol use of about 6.0 - 10.0 standard drinks of alcohol per week. He reports current drug use. Drug: Marijuana.  Allergies  Allergen Reactions   Advil [Ibuprofen] Shortness Of Breath  Aleve [Naproxen] Shortness Of Breath   Mucinex [Guaifenesin Er] Shortness Of Breath, Itching, Swelling, Dermatitis, Rash and Other (See Comments)    Swelling of hands and feet   Nsaids Shortness Of Breath, Itching, Swelling, Rash and Other (See Comments)    Aleve and Advil    Latex Rash and Other (See Comments)    Cartilage will harden      Family History  Problem Relation Age of Onset   Arthritis Mother    Hypertension Mother    Diabetes Mother    Cancer Mother    Arthritis Father    Hypertension Father    Diabetes Father    Sleep apnea Father    Obesity Father    Arthritis Maternal Grandmother    Diabetes Maternal Grandmother    Stroke Maternal Grandmother    Arthritis Paternal Grandmother    Diabetes Paternal Grandmother    Cancer Paternal Grandmother        lung, smoker   Diabetes Paternal Grandfather    Heart disease Paternal Grandfather 52       MI   Hyperlipidemia Brother    Hypertension Brother    Diabetes Brother    COPD Maternal Grandfather    Kidney disease Neg Hx     Prior to Admission medications   Medication Sig Start Date End Date Taking? Authorizing Provider  amLODipine (NORVASC) 5 MG tablet Take 5 mg by mouth daily as needed (as directed for a systolic number reading gretaer than 150).   Yes [provider]  carvedilol (COREG) 25 MG tablet Take 1 tablet (25 mg total) by mouth 2 (two) times daily. 06/19/22  Yes Chandrasekhar, Mahesh A, MD  lidocaine-prilocaine (EMLA) cream Apply 1 Application topically as needed (for port access).   Yes [provider]  loperamide (IMODIUM) 2 MG capsule Take 1 capsule (2 mg total) by mouth 2 (two) times daily. Patient taking differently: Take 2 mg by mouth in the morning. 08/26/22  Yes Gaynelle Adu, MD  metFORMIN (GLUCOPHAGE) 500 MG tablet Take 1 tablet (500 mg total) by mouth daily with breakfast. Patient taking differently: Take 500 mg by mouth at bedtime. 09/18/22  Yes Bradd Canary, MD  ondansetron (ZOFRAN) 8 MG tablet Take 8 mg by mouth every 8 (eight) hours as needed for nausea or vomiting.   Yes [provider]  pantoprazole (PROTONIX) 40 MG tablet Take 1 tablet (40 mg total) by mouth daily. Patient taking differently: Take 40 mg by mouth daily before breakfast. 11/22/22  Yes Bradd Canary, MD  prochlorperazine (COMPAZINE) 10  MG tablet Take 10 mg by mouth every 6 (six) hours as needed for nausea or vomiting.   Yes [provider]  sitaGLIPtin (JANUVIA) 100 MG tablet Take 1 tablet (100 mg total) by mouth daily. 09/18/22  Yes Bradd Canary, MD  tiZANidine (ZANAFLEX) 2 MG tablet Take 0.5-2 tablets (1-4 mg total) by mouth 2 (two) times daily as needed for muscle spasms. Patient taking differently: Take 2-4 mg by mouth 3 (three) times daily as needed for muscle spasms. 04/13/21  Yes Bradd Canary, MD  glucose blood (COOL BLOOD GLUCOSE TEST STRIPS) test strip Check blood sugar once daily 09/18/22   Bradd Canary, MD  ondansetron (ZOFRAN) 4 MG tablet Take 1 tablet (4 mg total) by mouth every 6 (six) hours as needed for nausea. Patient not taking: Reported on 11/30/2022 09/30/22   Drema Dallas, MD    Physical Exam: Vitals:   11/30/22 1611  BP: (!) 150/105  Pulse: 67  Resp: 18  Temp: 97.8 F (36.6 C)  TempSrc: Oral  SpO2: 99%  Weight: 110.6 kg  Height:  (1.905 m)   Physical Exam Constitutional:      General: He is not in acute distress.    Appearance: Normal appearance. He is not ill-appearing, toxic-appearing or diaphoretic.  HENT:     Head: Normocephalic and atraumatic.     Mouth/Throat:     Mouth: Mucous membranes are moist.  Eyes:     General: No scleral icterus. Cardiovascular:     Rate and Rhythm: Normal rate and regular rhythm.     Heart sounds: Normal heart sounds.  Pulmonary:     Effort: Pulmonary effort is normal. No respiratory distress.     Breath sounds: Normal breath sounds. No wheezing or rales.  Abdominal:     General: Abdomen is flat. Bowel sounds are normal. There is no distension.     Palpations: Abdomen is soft. There is no mass.     Tenderness: There is no abdominal tenderness. There is no guarding.     Comments: Ileostomy present in right lower quadrant, scant output  Musculoskeletal:     Right lower leg: No edema.     Left lower leg: No edema.  Skin:    General:  Skin is warm and dry.  Neurological:     General: No focal deficit present.     Mental Status: He is alert.  Psychiatric:        Mood and Affect: Mood normal.        Behavior: Behavior normal.     Data Reviewed: Last metabolic panel Lab Results  Component Value Date   GLUCOSE 159 (H) 11/30/2022   NA 133 (L) 11/30/2022   K 3.8 11/30/2022   CL 100 11/30/2022   CO2 25 11/30/2022   BUN 35 (H) 11/30/2022   CREATININE 2.11 (H) 11/30/2022   GFRNONAA 37 (L) 11/30/2022   CALCIUM 10.4 (H) 11/30/2022   PHOS 3.8 09/30/2022   PROT 8.0 11/30/2022   ALBUMIN 4.6 11/30/2022   BILITOT 0.8 11/30/2022   ALKPHOS 85 11/30/2022   AST 36 11/30/2022   ALT 25 11/30/2022   ANIONGAP 8 11/30/2022   Lab Results  Component Value Date   WBC 4.9 11/30/2022   HGB 14.1 11/30/2022   HCT 41.8 11/30/2022   MCV 79.2 (L) 11/30/2022   PLT 198 11/30/2022     Assessment and Plan: Robert [name] is a 52 y.o. 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   * AKI (acute kidney injury) No clear inciting factor, may be dumping due to ileostomy, GI panel is also pending.  Status post 1 L IV fluids, normal EF on most recent echo. - Give additional 1 L LR bolus, after that run fluids at 125 cc an hour of LR - Follow-up GI panel PCR and C. difficile testing - Continue loperamide as needed to help control ostomy output - Trend BMP  Known medical problems Hypertension-continue carvedilol 25 mg twice daily DM2-discontinue home metformin and Januvia, sensitive sliding scale insulin correction factor GERD-continue PPI Muscle spasm-continue tizanidine as needed      Advance Care Planning:   Code Status: Full Code , confirmed with  patient  Consults: None  Family Communication: None  Severity of Illness: The appropriate patient status for this patient is OBSERVATION. Observation status is judged to be reasonable and necessary in order to provide the required intensity of service to ensure the patient's safety. The patient's presenting symptoms, physical exam findings, and initial radiographic and laboratory data in the context of their medical condition is felt to place them at decreased risk for further clinical deterioration. Furthermore, it is anticipated that the patient will be medically stable for discharge from the hospital within 2 midnights of admission.   Author: Venora Maples, MD 11/30/2022 6:03 PM  For on call review [email or web address].

## 2022-12-01 DIAGNOSIS — Z87891 Personal history of nicotine dependence: Secondary | ICD-10-CM | POA: Diagnosis not present

## 2022-12-01 DIAGNOSIS — E669 Obesity, unspecified: Secondary | ICD-10-CM | POA: Diagnosis present

## 2022-12-01 DIAGNOSIS — E785 Hyperlipidemia, unspecified: Secondary | ICD-10-CM | POA: Diagnosis present

## 2022-12-01 DIAGNOSIS — Z825 Family history of asthma and other chronic lower respiratory diseases: Secondary | ICD-10-CM | POA: Diagnosis not present

## 2022-12-01 DIAGNOSIS — I10 Essential (primary) hypertension: Secondary | ICD-10-CM | POA: Diagnosis present

## 2022-12-01 DIAGNOSIS — Z79899 Other long term (current) drug therapy: Secondary | ICD-10-CM | POA: Diagnosis not present

## 2022-12-01 DIAGNOSIS — N179 Acute kidney failure, unspecified: Secondary | ICD-10-CM | POA: Diagnosis present

## 2022-12-01 DIAGNOSIS — E871 Hypo-osmolality and hyponatremia: Secondary | ICD-10-CM | POA: Diagnosis present

## 2022-12-01 DIAGNOSIS — Z833 Family history of diabetes mellitus: Secondary | ICD-10-CM | POA: Diagnosis not present

## 2022-12-01 DIAGNOSIS — E119 Type 2 diabetes mellitus without complications: Secondary | ICD-10-CM | POA: Diagnosis present

## 2022-12-01 DIAGNOSIS — E860 Dehydration: Secondary | ICD-10-CM | POA: Diagnosis present

## 2022-12-01 DIAGNOSIS — Z8249 Family history of ischemic heart disease and other diseases of the circulatory system: Secondary | ICD-10-CM | POA: Diagnosis not present

## 2022-12-01 DIAGNOSIS — Z8261 Family history of arthritis: Secondary | ICD-10-CM | POA: Diagnosis not present

## 2022-12-01 DIAGNOSIS — Z823 Family history of stroke: Secondary | ICD-10-CM | POA: Diagnosis not present

## 2022-12-01 DIAGNOSIS — K219 Gastro-esophageal reflux disease without esophagitis: Secondary | ICD-10-CM | POA: Diagnosis present

## 2022-12-01 DIAGNOSIS — Z83438 Family history of other disorder of lipoprotein metabolism and other lipidemia: Secondary | ICD-10-CM | POA: Diagnosis not present

## 2022-12-01 DIAGNOSIS — E876 Hypokalemia: Secondary | ICD-10-CM | POA: Diagnosis present

## 2022-12-01 DIAGNOSIS — Z7984 Long term (current) use of oral hypoglycemic drugs: Secondary | ICD-10-CM | POA: Diagnosis not present

## 2022-12-01 DIAGNOSIS — M62838 Other muscle spasm: Secondary | ICD-10-CM | POA: Diagnosis present

## 2022-12-01 DIAGNOSIS — K9419 Other complications of enterostomy: Secondary | ICD-10-CM | POA: Diagnosis present

## 2022-12-01 DIAGNOSIS — C20 Malignant neoplasm of rectum: Secondary | ICD-10-CM | POA: Diagnosis present

## 2022-12-01 DIAGNOSIS — I2510 Atherosclerotic heart disease of native coronary artery without angina pectoris: Secondary | ICD-10-CM | POA: Diagnosis present

## 2022-12-01 DIAGNOSIS — I77810 Thoracic aortic ectasia: Secondary | ICD-10-CM | POA: Diagnosis present

## 2022-12-01 DIAGNOSIS — Z932 Ileostomy status: Secondary | ICD-10-CM | POA: Diagnosis not present

## 2022-12-01 LAB — GASTROINTESTINAL PANEL BY PCR, STOOL (REPLACES STOOL CULTURE)

## 2022-12-01 LAB — GLUCOSE, CAPILLARY
Glucose-Capillary: 125 mg/dL — ABNORMAL HIGH (ref 70–99)
Glucose-Capillary: 151 mg/dL — ABNORMAL HIGH (ref 70–99)
Glucose-Capillary: 143 mg/dL — ABNORMAL HIGH (ref 70–99)
Glucose-Capillary: 133 mg/dL — ABNORMAL HIGH (ref 70–99)

## 2022-12-01 LAB — COMPREHENSIVE METABOLIC PANEL
ALT: 23 U/L (ref 0–44)
AST: 30 U/L (ref 15–41)
Albumin: 3.5 g/dL (ref 3.5–5.0)
Alkaline Phosphatase: 62 U/L (ref 38–126)
Anion gap: 8 (ref 5–15)
BUN: 32 mg/dL — ABNORMAL HIGH (ref 6–20)
CO2: 22 mmol/L (ref 22–32)
Calcium: 8.8 mg/dL — ABNORMAL LOW (ref 8.9–10.3)
Chloride: 102 mmol/L (ref 98–111)
Creatinine, Ser: 1.92 mg/dL — ABNORMAL HIGH (ref 0.61–1.24)
GFR, Estimated: 41 mL/min — ABNORMAL LOW (ref >60)
Glucose, Bld: 122 mg/dL — ABNORMAL HIGH (ref 70–99)
Potassium: 3.1 mmol/L — ABNORMAL LOW (ref 3.5–5.1)
Sodium: 132 mmol/L — ABNORMAL LOW (ref 135–145)
Total Bilirubin: 0.6 mg/dL (ref 0.3–1.2)
Total Protein: 6.4 g/dL — ABNORMAL LOW (ref 6.5–8.1)

## 2022-12-01 LAB — CBC
HCT: 36.5 % — ABNORMAL LOW (ref 39.0–52.0)
Hemoglobin: 12 g/dL — ABNORMAL LOW (ref 13.0–17.0)
MCH: 26.4 pg (ref 26.0–34.0)
MCHC: 32.9 g/dL (ref 30.0–36.0)
MCV: 80.4 fL (ref 80.0–100.0)
Platelets: 144 10*3/uL — ABNORMAL LOW (ref 150–400)
RBC: 4.54 MIL/uL (ref 4.22–5.81)
RDW: 20 % — ABNORMAL HIGH (ref 11.5–15.5)
WBC: 5 10*3/uL (ref 4.0–10.5)
nRBC: 0.6 % — ABNORMAL HIGH (ref 0.0–0.2)

## 2022-12-01 MED ORDER — CHLORHEXIDINE GLUCONATE CLOTH 2 % EX PADS
6.0000 | MEDICATED_PAD | Freq: Every day | CUTANEOUS | Status: DC
  Administered 2022-12-01 – 2022-12-02 (×2): 6 via TOPICAL

## 2022-12-01 MED ORDER — SODIUM CHLORIDE 0.9% FLUSH
10.0000 mL | INTRAVENOUS | Status: DC | PRN
  Administered 2022-12-02: 10 mL

## 2022-12-01 MED ORDER — POTASSIUM CHLORIDE CRYS ER 20 MEQ PO TBCR
40.0000 meq | EXTENDED_RELEASE_TABLET | Freq: Two times a day (BID) | ORAL | Status: AC
  Administered 2022-12-01 (×2): 40 meq via ORAL
  Filled 2022-12-01 (×2): qty 2

## 2022-12-01 NOTE — Hospital Course (Addendum)
52 y.o. 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

## 2022-12-01 NOTE — Progress Notes (Deleted)
Patient BG check at 0800 was 125. It did not cross over into the system. Smiley Houseman, RN

## 2022-12-01 NOTE — Progress Notes (Signed)
  Progress Note   Patient: Robert [name] JYN:[id number] DOB: 1971/01/08 DOA: 11/30/2022     0 DOS: the patient was seen and examined on 12/01/2022   Brief hospital course: 52 y.o. 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   Assessment and Plan:  AKI (acute kidney injury) Suspected dumping due to ileostomy - normal EF on most recent echo. - Cdiff neg -Gi panel is neg - Continue loperamide as needed to help control ostomy output -Remains orthostatic by vital signs with drop in SBP from 135 to 113 on sitting to standing -continue IVF as tolerated -Cr improved to 1.9. -Recheck bmet in AM   Known medical problems Hypertension-continue carvedilol 25 mg twice daily  DM2-discontinue home metformin and Januvia -continue on sensitive sliding scale insulin correction factor  GERD -continue PPI  Muscle spasm -continue tizanidine as needed  Hyponatremia  Hypokalemia -Replaced    Subjective: Feeling better today. States stool output has improved. Still dizzy on standing  Physical Exam: Vitals:   12/01/22 1306 12/01/22 1314 12/01/22 1316 12/01/22 1320  BP: (S) (!) 142/86 (S) 135/86 (S) (!) 113/90 (S) (!) 127/94  Pulse: 64 66 71 69  Resp: 20     Temp: 98.4 F (36.9 C)     TempSrc: Oral     SpO2: 97% 98% 97% 97%  Weight:      Height:       General exam: Awake, laying in bed, in nad Respiratory system: Normal respiratory effort, no wheezing Cardiovascular system: regular rate, s1, s2 Gastrointestinal system: Soft, nondistended, positive BS Central nervous system: CN2-12 grossly intact, strength intact Extremities: Perfused, no clubbing Skin: Normal skin turgor, no notable skin lesions seen Psychiatry:  Mood normal // no visual hallucinations   Data Reviewed:  Labs reviewed: Na 132, K 3.1, cr 1.92   Family Communication: Pt in room, family not at bedside  Disposition: Status is: Observation The patient will require care spanning > 2 midnights and should be moved to inpatient because: Severity of illness  Planned Discharge Destination: Home    Author: Rickey Barbara, MD 12/01/2022 5:22 PM  For on call review [email or web address].

## 2022-12-02 DIAGNOSIS — N179 Acute kidney failure, unspecified: Secondary | ICD-10-CM | POA: Diagnosis not present

## 2022-12-02 LAB — COMPREHENSIVE METABOLIC PANEL
ALT: 20 U/L (ref 0–44)
AST: 27 U/L (ref 15–41)
Albumin: 2.9 g/dL — ABNORMAL LOW (ref 3.5–5.0)
Alkaline Phosphatase: 59 U/L (ref 38–126)
Anion gap: 9 (ref 5–15)
BUN: 18 mg/dL (ref 6–20)
CO2: 20 mmol/L — ABNORMAL LOW (ref 22–32)
Calcium: 8.6 mg/dL — ABNORMAL LOW (ref 8.9–10.3)
Chloride: 105 mmol/L (ref 98–111)
Creatinine, Ser: 1.68 mg/dL — ABNORMAL HIGH (ref 0.61–1.24)
GFR, Estimated: 48 mL/min — ABNORMAL LOW (ref >60)
Glucose, Bld: 150 mg/dL — ABNORMAL HIGH (ref 70–99)
Potassium: 3.5 mmol/L (ref 3.5–5.1)
Sodium: 134 mmol/L — ABNORMAL LOW (ref 135–145)
Total Bilirubin: 0.2 mg/dL — ABNORMAL LOW (ref 0.3–1.2)
Total Protein: 5.5 g/dL — ABNORMAL LOW (ref 6.5–8.1)

## 2022-12-02 LAB — GLUCOSE, CAPILLARY
Glucose-Capillary: 135 mg/dL — ABNORMAL HIGH (ref 70–99)
Glucose-Capillary: 160 mg/dL — ABNORMAL HIGH (ref 70–99)

## 2022-12-02 LAB — MAGNESIUM: Magnesium: 1.6 mg/dL — ABNORMAL LOW (ref 1.7–2.4)

## 2022-12-02 MED ORDER — HEPARIN SOD (PORK) LOCK FLUSH 100 UNIT/ML IV SOLN
500.0000 [IU] | INTRAVENOUS | Status: AC | PRN
  Administered 2022-12-02: 500 [IU]

## 2022-12-02 NOTE — Progress Notes (Signed)
Discharge AVS discussed with patient. Port was deaccessed by IV team. Opportunity for questions given, all questions answered.   Smiley Houseman, RN

## 2022-12-02 NOTE — Discharge Summary (Signed)
Physician Discharge Summary   Patient: Robert [name] MRN: [id number] DOB: [birth year]  Admit date:     11/30/2022  Discharge date: 12/02/22  Discharge Physician: Rickey Barbara   PCP: Bradd Canary, MD   Recommendations at discharge:    Follow up with PCP in 1-2 weeks Follow up with Oncologist as scheduled  Discharge Diagnoses: Principal Problem:   AKI (acute kidney injury) Active Problems:   Rectal cancer   HTN (hypertension)   Hyperlipidemia LDL goal <70   Type 2 diabetes mellitus with obesity   CAD (coronary artery disease)   Ileostomy in place   Dehydration   Known medical problems  Resolved Problems:   * No resolved hospital problems. *  Hospital Course: 52 y.o. 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   Assessment and Plan:  AKI (acute kidney injury) Suspected dumping due to ileostomy - normal EF on most recent echo. - Cdiff neg -Gi panel is neg - Continued loperamide as needed to help control ostomy output -Pt was found to be orthostatic by vital signs during this visit, associated with dizziness -Patient was continued on IVF and repeat orthostatic vitals were much improved -Cr improved to 1.6 -Pt tolerated diet well and reported ostomy output had improved to baseline.   Known medical problems Hypertension-continue carvedilol 25 mg twice daily   DM2-discontinue home metformin and Januvia -continue on sensitive sliding scale insulin correction factor while in hospital -Resume home meds on d/c   GERD -continue PPI   Muscle spasm -continue tizanidine as needed   Hyponatremia -Improved with IVF hydration   Hypokalemia -Replaced     Consultants:  Procedures performed:    Disposition: Home Diet recommendation:  Regular diet DISCHARGE MEDICATION: Allergies as of 12/02/2022       Reactions   Advil [ibuprofen] Shortness Of Breath   Aleve [naproxen] Shortness Of Breath   Mucinex [guaifenesin Er] Shortness Of Breath, Itching, Swelling, Dermatitis, Rash, Other (See Comments)   Swelling of hands and feet   Nsaids Shortness Of Breath, Itching, Swelling, Rash, Other (See Comments)   Aleve and Advil    Latex Rash, Other (See Comments)   Cartilage will harden         Medication List     TAKE these medications    amLODipine 5 MG tablet Commonly known as: NORVASC Take 5 mg by mouth daily as needed (as directed for a systolic number reading gretaer than 150).   carvedilol 25 MG tablet Commonly known as: COREG Take 1 tablet (25 mg total) by mouth 2 (two) times daily.   Cool Blood Glucose Test Strips test strip Generic drug: glucose blood Check blood sugar once daily   lidocaine-prilocaine cream Commonly known as: EMLA Apply 1 Application topically as needed (for port access).   loperamide 2 MG capsule Commonly known as: IMODIUM Take 1 capsule (2 mg total) by mouth 2 (two) times daily. What changed: when to take this   metFORMIN 500 MG tablet Commonly known as: GLUCOPHAGE Take 1 tablet (500 mg total) by mouth daily with breakfast. What changed: when to take this   ondansetron 8 MG tablet Commonly known as: ZOFRAN Take 8 mg by mouth every 8 (eight) hours as needed for nausea or vomiting.   ondansetron 4 MG tablet  Commonly known as: ZOFRAN Take 1 tablet (4 mg total) by mouth every 6 (six) hours as needed for nausea.   pantoprazole 40 MG tablet Commonly known as: PROTONIX Take 1 tablet (40 mg total) by mouth daily. What changed: when to take this   prochlorperazine 10 MG tablet Commonly known as: COMPAZINE Take 10 mg by mouth every 6 (six) hours as needed for nausea or vomiting.   sitaGLIPtin 100 MG tablet Commonly known as:  Januvia Take 1 tablet (100 mg total) by mouth daily.   tiZANidine 2 MG tablet Commonly known as: ZANAFLEX Take 0.5-2 tablets (1-4 mg total) by mouth 2 (two) times daily as needed for muscle spasms. What changed:  how much to take when to take this        Follow-up Information     Bradd Canary, MD Follow up in 2 week(s).   Specialty: Family Medicine Why: Hospital follow up Contact information: [address] [place] [address] [phone]                Discharge Exam: [facility]   11/30/22 1611  Weight: 110.6 kg   General exam: Awake, laying in bed, in nad Respiratory system: Normal respiratory effort, no wheezing Cardiovascular system: regular rate, s1, s2 Gastrointestinal system: Soft, nondistended, positive BS Central nervous system: CN2-12 grossly intact, strength intact Extremities: Perfused, no clubbing Skin: Normal skin turgor, no notable skin lesions seen Psychiatry: Mood normal // no visual hallucinations   Condition at discharge: fair  The results of significant diagnostics from this hospitalization (including imaging, microbiology, ancillary and laboratory) are listed below for reference.   Imaging Studies: DG BE (COLON)W SINGLE CM (SOL OR THIN BA)  Result Date: 11/19/2022 CLINICAL DATA:  Rectal cancer status post surgical resection with colorectal anastomosis with diverting ileostomy, presenting for evaluation of the colorectal anastomosis prior to ileostomy takedown. EXAM: SINGLE CONTRAST BARIUM ENEMA TECHNIQUE: Initial scout AP supine abdominal image obtained to insure adequate colon cleansing. Barium was introduced into the colon in a retrograde fashion and refluxed from the rectum to the cecum. Spot images of the colon followed by overhead radiographs were obtained. FLUOROSCOPY: Radiation Exposure Index (as provided by the fluoroscopic device): 72.5 mGy Kerma COMPARISON:  09/27/2022 CT abdomen/pelvis. FINDINGS: Scout  radiograph demonstrates anastomotic suture line overlying the coccyx. No dilated small bowel loops. Mild colonic gas. No evidence of pneumatosis or pneumoperitoneum. Installation of water-soluble contrast per rectum via non latex Foley catheter demonstrates intact end-to-side colorectal anastomosis with no evidence of anastomotic leak or stricture. Contrast proceeds retrograde without difficulty to the level of the mid transverse colon. No abnormal filling defects or diverticulosis in the visualized large bowel. IMPRESSION: Intact colorectal anastomosis with no evidence of anastomotic leak or stricture. Electronically Signed   By: Delbert Phenix M.D.   On: 11/19/2022 11:22    Microbiology: Results for orders placed or performed in visit on 11/30/22  C difficile quick screen w PCR reflex     Status: None   Collection Time: 11/30/22  3:19 PM   Specimen: STOOL  Result Value Ref Range Status   C Diff antigen NEGATIVE NEGATIVE Final   C Diff toxin NEGATIVE NEGATIVE Final   C Diff interpretation No C. difficile detected.  Final    Comment: Performed at [facility], [address] [place], [place] [address]  Gastrointestinal Panel by PCR , Stool     Status: None   Collection Time: 11/30/22  3:22 PM  Result Value Ref Range Status   Campylobacter species NOT DETECTED NOT DETECTED Final   Plesimonas shigelloides NOT DETECTED NOT DETECTED Final   Salmonella species NOT DETECTED NOT DETECTED Final   Yersinia enterocolitica NOT DETECTED NOT DETECTED Final   Vibrio species NOT DETECTED NOT DETECTED Final   Vibrio cholerae NOT DETECTED NOT DETECTED Final   Enteroaggregative E coli (EAEC) NOT DETECTED NOT DETECTED Final   Enteropathogenic E coli (EPEC) NOT DETECTED NOT DETECTED Final   Enterotoxigenic E coli (ETEC) NOT DETECTED NOT DETECTED Final   Shiga like toxin producing E coli (STEC) NOT DETECTED NOT DETECTED Final   Shigella/Enteroinvasive E coli (EIEC) NOT DETECTED NOT DETECTED  Final   Cryptosporidium NOT DETECTED NOT DETECTED Final   Cyclospora cayetanensis NOT DETECTED NOT DETECTED Final   Entamoeba histolytica NOT DETECTED NOT DETECTED Final   Giardia lamblia NOT DETECTED NOT DETECTED Final   Adenovirus F40/41 NOT DETECTED NOT DETECTED Final   Astrovirus NOT DETECTED NOT DETECTED Final   Norovirus GI/GII NOT DETECTED NOT DETECTED Final   Rotavirus A NOT DETECTED NOT DETECTED Final   Sapovirus (I, II, IV, and V) NOT DETECTED NOT DETECTED Final    Comment: Performed at [facility], [address] [place], [place] [address]    Labs: CBC: Recent Labs  Lab 11/30/22 1145 12/01/22 0350  WBC 4.9 5.0  NEUTROABS 2.1  --   HGB 14.1 12.0*  HCT 41.8 36.5*  MCV 79.2* 80.4  PLT 198 144*   Basic Metabolic Panel: Recent Labs  Lab 11/30/22 1145 12/01/22 0350 12/02/22 1012  NA 133* 132* 134*  K 3.8 3.1* 3.5  CL 100 102 105  CO2 25 22 20*  GLUCOSE 159* 122* 150*  BUN 35* 32* 18  CREATININE 2.11* 1.92* 1.68*  CALCIUM 10.4* 8.8* 8.6*  MG 1.8  --  1.6*   Liver Function Tests: Recent Labs  Lab 11/30/22 1145 12/01/22 0350 12/02/22 1012  AST 36 30 27  ALT ALKPHOS 85 62 59  BILITOT 0.8 0.6 0.2*  PROT 8.0 6.4* 5.5*  ALBUMIN 4.6 3.5 2.9*   CBG: Recent Labs  Lab 12/01/22 1212 12/01/22 1636 12/01/22 2059 12/02/22 0755 12/02/22 1141  GLUCAP 151* 143* 133* 135* 160*    Discharge time spent: less than 30 minutes.  Signed: Rickey Barbara, MD [facility] 12/02/2022

## 2022-12-03 ENCOUNTER — Telehealth: Payer: Self-pay

## 2022-12-03 ENCOUNTER — Encounter: Payer: Self-pay | Admitting: Hematology

## 2022-12-03 NOTE — Transitions of Care (Post Inpatient/ED Visit) (Signed)
   12/03/2022  Name: Robert [name] MRN: [id number] DOB: [birth year]  Today's TOC FU Call Status: Today's TOC FU Call Status:: Successful TOC FU Call Competed TOC FU Call Complete Date: 12/03/22  Transition Care Management Follow-up Telephone Call Date of Discharge: 12/02/22 Discharge Facility: [facility]) Type of Discharge: Inpatient Admission How have you been since you were released from the hospital?: Better Any questions or concerns?: No  Items Reviewed: Did you receive and understand the discharge instructions provided?: Yes Medications obtained and verified?: Yes (Medications Reviewed) Any new allergies since your discharge?: No Dietary orders reviewed?: Yes Do you have support at home?: Yes People in Home: spouse  Home Care and Equipment/Supplies: Were Home Health Services Ordered?: NA Any new equipment or medical supplies ordered?: NA  Functional Questionnaire: Do you need assistance with bathing/showering or dressing?: No Do you need assistance with meal preparation?: No Do you need assistance with eating?: No Do you have difficulty maintaining continence: No Do you need assistance with getting out of bed/getting out of a chair/moving?: No Do you have difficulty managing or taking your medications?: No  Follow up appointments reviewed: PCP Follow-up appointment confirmed?: No (no avail appt  sent message to staff to schedule) MD Provider Line [phone] Given: No Specialist Hospital Follow-up appointment confirmed?: No Reason Specialist Follow-Up Not Confirmed: Patient has Specialist Provider Number and will Call for Appointment Do you need transportation to your follow-up appointment?: No Do you understand care options if your condition(s) worsen?: Yes-patient verbalized understanding    SIGNATURE Karena Addison, LPN [facility] Nurse Health Advisor Direct Dial [phone]

## 2022-12-04 ENCOUNTER — Inpatient Hospital Stay: Payer: BLUE CROSS/BLUE SHIELD

## 2022-12-04 ENCOUNTER — Other Ambulatory Visit: Payer: Self-pay

## 2022-12-04 ENCOUNTER — Inpatient Hospital Stay (HOSPITAL_COMMUNITY)
Admission: EM | Admit: 2022-12-04 | Discharge: 2022-12-10 | DRG: 683 | Disposition: A | Discharge status: Home / Self Care | Payer: BLUE CROSS/BLUE SHIELD | Source: Ambulatory Visit | Attending: Internal Medicine | Admitting: Internal Medicine

## 2022-12-04 ENCOUNTER — Encounter: Payer: Self-pay | Admitting: Nurse Practitioner

## 2022-12-04 ENCOUNTER — Encounter (HOSPITAL_COMMUNITY): Payer: Self-pay

## 2022-12-04 ENCOUNTER — Inpatient Hospital Stay (HOSPITAL_BASED_OUTPATIENT_CLINIC_OR_DEPARTMENT_OTHER): Payer: BLUE CROSS/BLUE SHIELD | Admitting: Nurse Practitioner

## 2022-12-04 VITALS — BP 124/89 | HR 72 | Temp 98.5°F | Resp 18

## 2022-12-04 DIAGNOSIS — Z888 Allergy status to other drugs, medicaments and biological substances status: Secondary | ICD-10-CM

## 2022-12-04 DIAGNOSIS — C20 Malignant neoplasm of rectum: Secondary | ICD-10-CM | POA: Diagnosis present

## 2022-12-04 DIAGNOSIS — K219 Gastro-esophageal reflux disease without esophagitis: Secondary | ICD-10-CM | POA: Diagnosis present

## 2022-12-04 DIAGNOSIS — Z932 Ileostomy status: Secondary | ICD-10-CM

## 2022-12-04 DIAGNOSIS — N179 Acute kidney failure, unspecified: Principal | ICD-10-CM | POA: Diagnosis present

## 2022-12-04 DIAGNOSIS — E669 Obesity, unspecified: Secondary | ICD-10-CM | POA: Diagnosis present

## 2022-12-04 DIAGNOSIS — Z95828 Presence of other vascular implants and grafts: Secondary | ICD-10-CM

## 2022-12-04 DIAGNOSIS — E860 Dehydration: Secondary | ICD-10-CM | POA: Diagnosis present

## 2022-12-04 DIAGNOSIS — Z91040 Latex allergy status: Secondary | ICD-10-CM

## 2022-12-04 DIAGNOSIS — Z7969 Long term (current) use of other immunomodulators and immunosuppressants: Secondary | ICD-10-CM

## 2022-12-04 DIAGNOSIS — Z8249 Family history of ischemic heart disease and other diseases of the circulatory system: Secondary | ICD-10-CM

## 2022-12-04 DIAGNOSIS — E785 Hyperlipidemia, unspecified: Secondary | ICD-10-CM | POA: Diagnosis present

## 2022-12-04 DIAGNOSIS — Z833 Family history of diabetes mellitus: Secondary | ICD-10-CM

## 2022-12-04 DIAGNOSIS — Z825 Family history of asthma and other chronic lower respiratory diseases: Secondary | ICD-10-CM

## 2022-12-04 DIAGNOSIS — Z7984 Long term (current) use of oral hypoglycemic drugs: Secondary | ICD-10-CM

## 2022-12-04 DIAGNOSIS — Z87891 Personal history of nicotine dependence: Secondary | ICD-10-CM

## 2022-12-04 DIAGNOSIS — E1169 Type 2 diabetes mellitus with other specified complication: Secondary | ICD-10-CM | POA: Diagnosis present

## 2022-12-04 DIAGNOSIS — E119 Type 2 diabetes mellitus without complications: Secondary | ICD-10-CM | POA: Diagnosis present

## 2022-12-04 DIAGNOSIS — I10 Essential (primary) hypertension: Secondary | ICD-10-CM | POA: Diagnosis present

## 2022-12-04 DIAGNOSIS — E876 Hypokalemia: Secondary | ICD-10-CM | POA: Diagnosis not present

## 2022-12-04 DIAGNOSIS — T451X5A Adverse effect of antineoplastic and immunosuppressive drugs, initial encounter: Secondary | ICD-10-CM | POA: Diagnosis present

## 2022-12-04 DIAGNOSIS — Z83438 Family history of other disorder of lipoprotein metabolism and other lipidemia: Secondary | ICD-10-CM

## 2022-12-04 DIAGNOSIS — I2510 Atherosclerotic heart disease of native coronary artery without angina pectoris: Secondary | ICD-10-CM | POA: Diagnosis present

## 2022-12-04 DIAGNOSIS — Z886 Allergy status to analgesic agent status: Secondary | ICD-10-CM

## 2022-12-04 DIAGNOSIS — K9419 Other complications of enterostomy: Secondary | ICD-10-CM | POA: Diagnosis present

## 2022-12-04 DIAGNOSIS — Z79899 Other long term (current) drug therapy: Secondary | ICD-10-CM

## 2022-12-04 DIAGNOSIS — Z823 Family history of stroke: Secondary | ICD-10-CM

## 2022-12-04 DIAGNOSIS — R197 Diarrhea, unspecified: Secondary | ICD-10-CM

## 2022-12-04 DIAGNOSIS — Z8261 Family history of arthritis: Secondary | ICD-10-CM

## 2022-12-04 LAB — CBC WITH DIFFERENTIAL (CANCER CENTER ONLY)
Abs Immature Granulocytes: 0.04 10*3/uL (ref 0.00–0.07)
Basophils Absolute: 0 10*3/uL (ref 0.0–0.1)
Basophils Relative: 1 %
Eosinophils Absolute: 0.1 10*3/uL (ref 0.0–0.5)
Eosinophils Relative: 2 %
HCT: 39.4 % (ref 39.0–52.0)
Hemoglobin: 13.7 g/dL (ref 13.0–17.0)
Immature Granulocytes: 1 %
Lymphocytes Relative: 24 %
Lymphs Abs: 1.7 10*3/uL (ref 0.7–4.0)
MCH: 27.4 pg (ref 26.0–34.0)
MCHC: 34.8 g/dL (ref 30.0–36.0)
MCV: 78.8 fL — ABNORMAL LOW (ref 80.0–100.0)
Monocytes Absolute: 0.8 10*3/uL (ref 0.1–1.0)
Monocytes Relative: 11 %
Neutro Abs: 4.4 10*3/uL (ref 1.7–7.7)
Neutrophils Relative %: 61 %
Platelet Count: 113 10*3/uL — ABNORMAL LOW (ref 150–400)
RBC: 5 MIL/uL (ref 4.22–5.81)
RDW: 21 % — ABNORMAL HIGH (ref 11.5–15.5)
WBC Count: 7.1 10*3/uL (ref 4.0–10.5)
nRBC: 0 % (ref 0.0–0.2)

## 2022-12-04 LAB — CMP (CANCER CENTER ONLY)
ALT: 36 U/L (ref 0–44)
AST: 40 U/L (ref 15–41)
Albumin: 4.3 g/dL (ref 3.5–5.0)
Alkaline Phosphatase: 69 U/L (ref 38–126)
Anion gap: 10 (ref 5–15)
BUN: 21 mg/dL — ABNORMAL HIGH (ref 6–20)
CO2: 24 mmol/L (ref 22–32)
Calcium: 10 mg/dL (ref 8.9–10.3)
Chloride: 103 mmol/L (ref 98–111)
Creatinine: 2.15 mg/dL — ABNORMAL HIGH (ref 0.61–1.24)
GFR, Estimated: 36 mL/min — ABNORMAL LOW (ref >60)
Glucose, Bld: 192 mg/dL — ABNORMAL HIGH (ref 70–99)
Potassium: 3.4 mmol/L — ABNORMAL LOW (ref 3.5–5.1)
Sodium: 137 mmol/L (ref 135–145)
Total Bilirubin: 0.7 mg/dL (ref 0.3–1.2)
Total Protein: 7.5 g/dL (ref 6.5–8.1)

## 2022-12-04 LAB — GLUCOSE, CAPILLARY: Glucose-Capillary: 89 mg/dL (ref 70–99)

## 2022-12-04 MED ORDER — ONDANSETRON HCL 4 MG PO TABS
4.0000 mg | ORAL_TABLET | Freq: Four times a day (QID) | ORAL | Status: DC | PRN
  Administered 2022-12-05 – 2022-12-06 (×3): 4 mg via ORAL
  Filled 2022-12-04 (×3): qty 1

## 2022-12-04 MED ORDER — ENOXAPARIN SODIUM 40 MG/0.4ML IJ SOSY: 40.0000 mg | PREFILLED_SYRINGE | INTRAMUSCULAR | Status: DC

## 2022-12-04 MED ORDER — POTASSIUM CHLORIDE IN NACL 40-0.9 MEQ/L-% IV SOLN
INTRAVENOUS | Status: DC
  Filled 2022-12-04 (×6): qty 1000

## 2022-12-04 MED ORDER — ENSURE ENLIVE PO LIQD
237.0000 mL | Freq: Two times a day (BID) | ORAL | Status: DC
  Administered 2022-12-05 – 2022-12-10 (×7): 237 mL via ORAL

## 2022-12-04 MED ORDER — HEPARIN SOD (PORK) LOCK FLUSH 100 UNIT/ML IV SOLN: 500.0000 [IU] | Freq: Once | INTRAVENOUS | Status: DC | PRN

## 2022-12-04 MED ORDER — PANTOPRAZOLE SODIUM 40 MG PO TBEC
40.0000 mg | DELAYED_RELEASE_TABLET | Freq: Every evening | ORAL | Status: DC
  Administered 2022-12-05 – 2022-12-09 (×6): 40 mg via ORAL
  Filled 2022-12-04 (×6): qty 1

## 2022-12-04 MED ORDER — SODIUM CHLORIDE 0.9 % IV SOLN: Freq: Once | INTRAVENOUS | Status: AC

## 2022-12-04 MED ORDER — ACETAMINOPHEN 650 MG RE SUPP: 650.0000 mg | Freq: Four times a day (QID) | RECTAL | Status: DC | PRN

## 2022-12-04 MED ORDER — SODIUM CHLORIDE 0.9% FLUSH
10.0000 mL | Freq: Once | INTRAVENOUS | Status: AC
  Administered 2022-12-04: 10 mL

## 2022-12-04 MED ORDER — ACETAMINOPHEN 325 MG PO TABS: 650.0000 mg | ORAL_TABLET | Freq: Four times a day (QID) | ORAL | Status: DC | PRN

## 2022-12-04 MED ORDER — PROCHLORPERAZINE MALEATE 10 MG PO TABS
10.0000 mg | ORAL_TABLET | Freq: Four times a day (QID) | ORAL | Status: DC | PRN
  Administered 2022-12-05 – 2022-12-08 (×3): 10 mg via ORAL
  Filled 2022-12-04 (×3): qty 1

## 2022-12-04 MED ORDER — PANTOPRAZOLE SODIUM 40 MG PO TBEC: 40.0000 mg | DELAYED_RELEASE_TABLET | Freq: Every day | ORAL | Status: DC

## 2022-12-04 MED ORDER — AMLODIPINE BESYLATE 5 MG PO TABS
5.0000 mg | ORAL_TABLET | Freq: Every day | ORAL | Status: DC | PRN
  Administered 2022-12-05 – 2022-12-09 (×3): 5 mg via ORAL
  Filled 2022-12-04 (×3): qty 1

## 2022-12-04 MED ORDER — DIPHENOXYLATE-ATROPINE 2.5-0.025 MG PO TABS
2.0000 | ORAL_TABLET | Freq: Once | ORAL | Status: AC
  Administered 2022-12-04: 2 via ORAL
  Filled 2022-12-04: qty 2

## 2022-12-04 MED ORDER — INSULIN ASPART 100 UNIT/ML IJ SOLN
0.0000 [IU] | Freq: Three times a day (TID) | INTRAMUSCULAR | Status: DC
  Administered 2022-12-05 – 2022-12-06 (×2): 2 [IU] via SUBCUTANEOUS
  Administered 2022-12-06: 3 [IU] via SUBCUTANEOUS
  Administered 2022-12-08 – 2022-12-09 (×4): 2 [IU] via SUBCUTANEOUS
  Filled 2022-12-04: qty 0.15

## 2022-12-04 MED ORDER — ONDANSETRON HCL 4 MG/2ML IJ SOLN
8.0000 mg | Freq: Once | INTRAMUSCULAR | Status: AC
  Administered 2022-12-04: 8 mg via INTRAVENOUS
  Filled 2022-12-04: qty 4

## 2022-12-04 MED ORDER — SODIUM CHLORIDE 0.9 % IV SOLN: Freq: Once | INTRAVENOUS | Status: DC

## 2022-12-04 MED ORDER — ONDANSETRON HCL 4 MG/2ML IJ SOLN
4.0000 mg | Freq: Four times a day (QID) | INTRAMUSCULAR | Status: DC | PRN
  Administered 2022-12-06 – 2022-12-09 (×5): 4 mg via INTRAVENOUS
  Filled 2022-12-04 (×5): qty 2

## 2022-12-04 MED ORDER — LOPERAMIDE HCL 2 MG PO CAPS
2.0000 mg | ORAL_CAPSULE | Freq: Two times a day (BID) | ORAL | Status: DC
  Administered 2022-12-04 – 2022-12-07 (×6): 2 mg via ORAL
  Filled 2022-12-04 (×6): qty 1

## 2022-12-04 MED ORDER — SODIUM CHLORIDE 0.9% FLUSH: 10.0000 mL | Freq: Once | INTRAVENOUS | Status: DC | PRN

## 2022-12-04 MED ORDER — CARVEDILOL 25 MG PO TABS
25.0000 mg | ORAL_TABLET | Freq: Two times a day (BID) | ORAL | Status: DC
  Administered 2022-12-04 – 2022-12-10 (×12): 25 mg via ORAL
  Filled 2022-12-04 (×12): qty 1

## 2022-12-04 MED ORDER — SODIUM CHLORIDE 0.9 % IV SOLN
25.0000 mg | Freq: Once | INTRAVENOUS | Status: AC
  Administered 2022-12-04: 25 mg via INTRAVENOUS
  Filled 2022-12-04: qty 25

## 2022-12-04 MED ORDER — INSULIN ASPART 100 UNIT/ML IJ SOLN
0.0000 [IU] | Freq: Every day | INTRAMUSCULAR | Status: DC
  Filled 2022-12-04: qty 0.05

## 2022-12-04 NOTE — Patient Instructions (Signed)

## 2022-12-04 NOTE — ED Provider Notes (Signed)
[place] EMERGENCY DEPARTMENT AT [facility] Provider Note   CSN: [id number] Arrival date & time: 12/04/22  1433     History  Chief Complaint  Patient presents with   Dehydration    Robert [name] is a 52 y.o. male, history of colon Cancer, status post ileostomy, who presents to the ED secondary to concerns for dehydration after being sent by the cancer center.  Per patient he has completed his fourth dose of chemotherapy, and he started having 14 pound weight loss, and excess ileostomy output.  He has lost 14 pounds in the past week as well as had 6 episodes of diarrhea/output and 5 hours from his ileostomy.  This has been ongoing for the last 5 days, and he had his labs done at the cancer center today, and was still told to come in and be admitted given his copious output.  He also endorses severe nausea, but denies any abdominal pain.    Home Medications Prior to Admission medications   Medication Sig Start Date End Date Taking? Authorizing Provider  amLODipine (NORVASC) 5 MG tablet Take 5 mg by mouth daily as needed (as directed for a systolic number reading gretaer than 150).    [provider]  carvedilol (COREG) 25 MG tablet Take 1 tablet (25 mg total) by mouth 2 (two) times daily. 06/19/22   Riley Lam A, MD  glucose blood (COOL BLOOD GLUCOSE TEST STRIPS) test strip Check blood sugar once daily 09/18/22   Bradd Canary, MD  lidocaine-prilocaine (EMLA) cream Apply 1 Application topically as needed (for port access).    [provider]  loperamide (IMODIUM) 2 MG capsule Take 1 capsule (2 mg total) by mouth 2 (two) times daily. Patient taking differently: Take 2 mg by mouth in the morning. 08/26/22   Gaynelle Adu, MD  metFORMIN (GLUCOPHAGE) 500 MG tablet Take 1 tablet (500 mg total) by mouth daily with breakfast. Patient taking differently: Take 500 mg by mouth at bedtime. 09/18/22   Bradd Canary, MD  ondansetron (ZOFRAN) 4 MG tablet Take 1  tablet (4 mg total) by mouth every 6 (six) hours as needed for nausea. 09/30/22   Drema Dallas, MD  ondansetron (ZOFRAN) 8 MG tablet Take 8 mg by mouth every 8 (eight) hours as needed for nausea or vomiting.    [provider]  pantoprazole (PROTONIX) 40 MG tablet Take 1 tablet (40 mg total) by mouth daily. Patient taking differently: Take 40 mg by mouth daily before breakfast. 11/22/22   Bradd Canary, MD  prochlorperazine (COMPAZINE) 10 MG tablet Take 10 mg by mouth every 6 (six) hours as needed for nausea or vomiting.    [provider]  sitaGLIPtin (JANUVIA) 100 MG tablet Take 1 tablet (100 mg total) by mouth daily. 09/18/22   Bradd Canary, MD  tiZANidine (ZANAFLEX) 2 MG tablet Take 0.5-2 tablets (1-4 mg total) by mouth 2 (two) times daily as needed for muscle spasms. Patient taking differently: Take 2-4 mg by mouth 3 (three) times daily as needed for muscle spasms. 04/13/21   Bradd Canary, MD      Allergies    Advil [ibuprofen], Aleve [naproxen], Mucinex [guaifenesin er], Nsaids, and Latex    Review of Systems   Review of Systems  Gastrointestinal:  Positive for diarrhea and nausea. Negative for abdominal pain and vomiting.    Physical Exam Updated Vital Signs BP (!) 138/90   Pulse 66   Temp (!) 97.5 F (36.4  C) (Oral)   Resp 16   Ht  (1.905 m)   Wt 110 kg   SpO2 95%   BMI 30.31 kg/m  Physical Exam Vitals and nursing note reviewed.  Constitutional:      General: He is not in acute distress.    Appearance: He is well-developed.  HENT:     Head: Normocephalic and atraumatic.     Mouth/Throat:     Mouth: Mucous membranes are dry.  Eyes:     Conjunctiva/sclera: Conjunctivae normal.  Cardiovascular:     Rate and Rhythm: Normal rate and regular rhythm.     Heart sounds: No murmur heard. Pulmonary:     Effort: Pulmonary effort is normal. No respiratory distress.     Breath sounds: Normal breath sounds.  Abdominal:     Palpations: Abdomen is  soft.     Tenderness: There is no abdominal tenderness.  Musculoskeletal:        General: No swelling.     Cervical back: Neck supple.  Skin:    General: Skin is warm and dry.     Capillary Refill: Capillary refill takes less than 2 seconds.  Neurological:     Mental Status: He is alert.  Psychiatric:        Mood and Affect: Mood normal.     ED Results / Procedures / Treatments   Labs (all labs ordered are listed, but only abnormal results are displayed) Labs Reviewed - No data to display  EKG None  Radiology No results found.  Procedures Procedures   Medications Ordered in ED Medications  promethazine (PHENERGAN) 25 mg in sodium chloride 0.9 % 50 mL IVPB (has no administration in time range)  loperamide (IMODIUM) capsule 2 mg (has no administration in time range)  feeding supplement (ENSURE ENLIVE / ENSURE PLUS) liquid 237 mL (has no administration in time range)    ED Course/ Medical Decision Making/ A&P                             Medical Decision Making Patient is a 52 year old male, here for an AKI sent by cancer center for admission.  I reviewed his labs, and he does found to have an AKI, creatinine from 1.7-2.1+ in the span of 2 days.  Will start him on IV fluids, and give him nausea medicine.  His complaints are nausea, diarrhea after chemo.  General surgery evaluated him and recommended Imodium twice daily, and regular diet.  We will admit him for dehydration, he has no other complaints no abdominal pain, and is producing output.  Admitted to Dr. Erenest Blank  Risk Decision regarding hospitalization.    Final Clinical Impression(s) / ED Diagnoses Final diagnoses:  AKI (acute kidney injury)  Diarrhea, unspecified type    Rx / DC Orders ED Discharge Orders     None         Arnez Stoneking, Harley [place], PA 12/04/22 1658    Glyn Ade, MD 12/05/22 [id number]

## 2022-12-04 NOTE — H&P (Signed)
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 Cliffton Asters. Port-a-cath was placed 09/2022 and he was started on adjuvant chemotherapy with FOLFOX 10/10/22, last dose 4/10. He was recently admitted 4/19-4/21 for AKI due to high ileostomy output.  During that hospital stay, C. difficile and GI panel were negative, creatinine improved to 1.6, he tolerated a diet and ostomy output was reportedly back to baseline.  He was seen in the oncology office today by Santiago Glad, NP where he was noted to have high ileostomy output for the last couple of days and worsening renal function (creatinine 2.15 from baseline 1.6).  He was therefore sent to the ER for evaluation.  Denies chest pain, fevers, chills, vomiting.  4 days ago when he last changed his ostomy bag he did notice a little bit of blood coming from the ostomy itself, says that he did discuss this with the surgical team.  He gets a little bit of vague nausea and abdominal discomfort during high output like he was having yesterday.  ED Course: In the emergency department, he had stable vital signs, lab work from this morning was reviewed which revealed creatinine 2.15, potassium 3.4, CBC only remarkable for platelets 113.  Sodium 137.  General surgery service was consulted, who recommended fiber supplement and loperamide twice daily.  They recommend medical admission, hospitalist was contacted for admission.  Review of Systems: Please see HPI for pertinent positives and negatives. A complete 10 system review of systems are otherwise negative.  Past Medical History:  Diagnosis Date   Arthralgia 01/11/2013   Diffuse, b/l Hips R>L Knees, ankles, low back   Arthritis    Back pain 12/08/2015   Cancer     Coronary artery disease    Diabetes mellitus type 2 in obese 11/09/2012   Dyslipidemia 11/09/2012   Esophageal reflux 12/08/2015   Feeling grief 06/09/2020   History of migraine headaches    Hyperglycemia 11/09/2012   Hypertension    Past Surgical History:  Procedure Laterality Date   DIVERTING ILEOSTOMY N/A 08/23/2022   Procedure: DIVERTING LOOP ILEOSTOMY;  Surgeon: Andria Meuse, MD;  Location: [facility];  Service: General;  Laterality: N/A;   EUS N/A 06/21/2022   Procedure: LOWER ENDOSCOPIC ULTRASOUND (EUS);  Surgeon: Lemar Lofty., MD;  Location: [facility];  Service: Gastroenterology;  Laterality: N/A;   FLEXIBLE SIGMOIDOSCOPY N/A 06/21/2022   Procedure: FLEXIBLE SIGMOIDOSCOPY;  Surgeon: Meridee Score Netty Starring., MD;  Location: [facility];  Service: Gastroenterology;  Laterality: N/A;   FLEXIBLE SIGMOIDOSCOPY N/A 08/23/2022   Procedure: FLEXIBLE SIGMOIDOSCOPY;  Surgeon: Andria Meuse, MD;  Location: [facility];  Service: General;  Laterality: N/A;   PORTACATH PLACEMENT N/A 09/21/2022   Procedure: INSERTION PORT-A-CATH WITH ULTRASOUND GUIDANCE;  Surgeon: Andria Meuse, MD;  Location: [facility];  Service: General;  Laterality: N/A;   XI ROBOTIC ASSISTED LOWER ANTERIOR RESECTION N/A 08/23/2022   Procedure: XI ROBOTIC ASSISTED LOWER ANTERIOR RESECTION with Introperative assessment of profusion and TAP Block;  Surgeon: Andria Meuse, MD;  Location: [facility];  Service: General;  Laterality: N/A;    Social History:  reports that he quit smoking about 27 years ago. His smoking use included cigarettes. He has a 15.00 pack-year smoking history. He has never  used smokeless tobacco. He reports current alcohol use of about 6.0 - 10.0 standard drinks of alcohol per week. He reports current drug use. Drug: Marijuana.   Allergies  Allergen Reactions   Advil [Ibuprofen] Shortness Of Breath   Aleve [Naproxen] Shortness Of Breath   Mucinex [Guaifenesin Er] Shortness Of  Breath, Itching, Swelling, Dermatitis, Rash and Other (See Comments)    Swelling of hands and feet   Nsaids Shortness Of Breath, Itching, Swelling, Rash and Other (See Comments)    Aleve and Advil    Latex Rash and Other (See Comments)    Cartilage will harden     Family History  Problem Relation Age of Onset   Arthritis Mother    Hypertension Mother    Diabetes Mother    Cancer Mother    Arthritis Father    Hypertension Father    Diabetes Father    Sleep apnea Father    Obesity Father    Arthritis Maternal Grandmother    Diabetes Maternal Grandmother    Stroke Maternal Grandmother    Arthritis Paternal Grandmother    Diabetes Paternal Grandmother    Cancer Paternal Grandmother        lung, smoker   Diabetes Paternal Grandfather    Heart disease Paternal Grandfather 76       MI   Hyperlipidemia Brother    Hypertension Brother    Diabetes Brother    COPD Maternal Grandfather    Kidney disease Neg Hx      Prior to Admission medications   Medication Sig Start Date End Date Taking? Authorizing Provider  amLODipine (NORVASC) 5 MG tablet Take 5 mg by mouth daily as needed (as directed for a systolic number reading gretaer than 150).    [provider]  carvedilol (COREG) 25 MG tablet Take 1 tablet (25 mg total) by mouth 2 (two) times daily. 06/19/22   Riley Lam A, MD  glucose blood (COOL BLOOD GLUCOSE TEST STRIPS) test strip Check blood sugar once daily 09/18/22   Bradd Canary, MD  lidocaine-prilocaine (EMLA) cream Apply 1 Application topically as needed (for port access).    [provider]  loperamide (IMODIUM) 2 MG capsule Take 1 capsule (2 mg total) by mouth 2 (two) times daily. Patient taking differently: Take 2 mg by mouth in the morning. 08/26/22   Gaynelle Adu, MD  metFORMIN (GLUCOPHAGE) 500 MG tablet Take 1 tablet (500 mg total) by mouth daily with breakfast. Patient taking differently: Take 500 mg by mouth at bedtime. 09/18/22   Bradd Canary, MD  ondansetron (ZOFRAN) 4 MG tablet Take 1 tablet (4 mg total) by mouth every 6 (six) hours as needed for nausea. 09/30/22   Drema Dallas, MD  ondansetron (ZOFRAN) 8 MG tablet Take 8 mg by mouth every 8 (eight) hours as needed for nausea or vomiting.    [provider]  pantoprazole (PROTONIX) 40 MG tablet Take 1 tablet (40 mg total) by mouth daily. Patient taking differently: Take 40 mg by mouth daily before breakfast. 11/22/22   Bradd Canary, MD  prochlorperazine (COMPAZINE) 10 MG tablet Take 10 mg by mouth every 6 (six) hours as needed for nausea or vomiting.    [provider]  sitaGLIPtin (JANUVIA) 100 MG tablet Take 1 tablet (100 mg total) by mouth daily. 09/18/22   Bradd Canary, MD  tiZANidine (ZANAFLEX) 2 MG tablet Take 0.5-2 tablets (1-4 mg total) by mouth 2 (two) times daily as needed for muscle  spasms. Patient taking differently: Take 2-4 mg by mouth 3 (three) times daily as needed for muscle spasms. 04/13/21   Bradd Canary, MD    Physical Exam: BP (!) 138/90   Pulse 66   Temp (!) 97.5 F (36.4 C) (Oral)   Resp 16   Ht 6\' 3"  (1.905 m)   Wt 110 kg   SpO2 95%   BMI 30.31 kg/m   General:  Alert, oriented, calm, in no acute distress  Eyes: EOMI, clear conjuctivae, white sclerea Neck: supple, no masses, trachea mildline  Cardiovascular: RRR, no murmurs or rubs, no peripheral edema  Respiratory: clear to auscultation bilaterally, no wheezes, no crackles  Abdomen: soft, nontender, nondistended, normal bowel tones heard  Skin: dry, no rashes  Musculoskeletal: no joint effusions, normal range of motion  Psychiatric: appropriate affect, normal speech  Neurologic: extraocular muscles intact, clear speech, moving all extremities with intact sensorium          Labs on Admission:  Basic Metabolic Panel: Recent Labs  Lab 11/30/22 1145 12/01/22 0350 12/02/22 1012 12/04/22 1027  NA 133* 132* 134* 137  K 3.8 3.1* 3.5 3.4*  CL 100 102 105 103   CO2 25 22 20* 24  GLUCOSE 159* 122* 150* 192*  BUN 35* 32* 18 21*  CREATININE 2.11* 1.92* 1.68* 2.15*  CALCIUM 10.4* 8.8* 8.6* 10.0  MG 1.8  --  1.6*  --    Liver Function Tests: Recent Labs  Lab 11/30/22 1145 12/01/22 0350 12/02/22 1012 12/04/22 1027  AST 36 30 27 40  ALT 25 23 20  36  ALKPHOS 85 62 59 69  BILITOT 0.8 0.6 0.2* 0.7  PROT 8.0 6.4* 5.5* 7.5  ALBUMIN 4.6 3.5 2.9* 4.3   No results for input(s): "LIPASE", "AMYLASE" in the last 168 hours. No results for input(s): "AMMONIA" in the last 168 hours. CBC: Recent Labs  Lab 11/30/22 1145 12/01/22 0350 12/04/22 1027  WBC 4.9 5.0 7.1  NEUTROABS 2.1  --  4.4  HGB 14.1 12.0* 13.7  HCT 41.8 36.5* 39.4  MCV 79.2* 80.4 78.8*  PLT 198 144* 113*   Cardiac Enzymes: No results for input(s): "CKTOTAL", "CKMB", "CKMBINDEX", "TROPONINI" in the last 168 hours.  BNP (last 3 results) No results for input(s): "BNP" in the last 8760 hours.  ProBNP (last 3 results) No results for input(s): "PROBNP" in the last 8760 hours.  CBG: Recent Labs  Lab 12/01/22 1212 12/01/22 1636 12/01/22 2059 12/02/22 0755 12/02/22 1141  GLUCAP 151* 143* 133* 135* 160*    Radiological Exams on Admission: No results found.  Assessment/Plan Principal Problem:   AKI (acute kidney injury)-due to high ostomy output, which has been a problem for him.  He was seen by general surgery in the ER who recommends the following. -Observation admission -Continue diabetic diet with supplementation (ordered by surgery) -Hold metformin, sliding scale insulin in the meantime -Imodium scheduled 2 mg p.o. twice daily -Protonix p.o. q. evening rather than in the morning at patient request -Avoid nephrotoxins, hydrate with normal saline and recheck renal function in the morning  Active Problems:   Rectal cancer   HTN (hypertension)   Hyperlipidemia LDL goal <70   Type 2 diabetes mellitus with obesity   CAD (coronary artery disease)   Ileostomy in  place   Dehydration   DVT prophylaxis: Lovenox     Code Status: Prior  Consults called: None  Admission status: Observation  Time spent: 43 minutes  Kasey Hansell Sharlette Dense MD [facility]  Pager [phone]  If 7PM-7AM, please contact night-coverage [email or web address] Password [facility]  12/04/2022, 5:46 PM

## 2022-12-04 NOTE — Consult Note (Signed)
Robert [name] September 06, 1970  [id number].    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 Cliffton Asters. Port-a-cath was placed 09/2022 and he was started on adjuvant chemotherapy with FOLFOX 10/10/22, last dose 4/10. He was recently admitted 4/19-4/21 for AKI due to high ileostomy output. He was seen in the oncology office today by Santiago Glad, NP where he was noted to have high ileostomy output and worsening renal function (creatnine 2.15 from baseline 1.6). His chemotherapy session was postponed and he was directed to the ED for hospital admission and rehydration. We are asked to see.  He states that this last round of chemotherapy has been very challenging and has caused nausea, fatigue, and generalized malaise.  He has not been eating as well, but has been having high output from his ileostomy.  He states that he dropped his BID imodium to q day as his output was paste like with the BID imodium and that was more difficult to take care of.  Because of this decrease his output has gone back up.   ROS: Review of Systems  Constitutional:  Positive for malaise/fatigue and weight loss.  Gastrointestinal:  Positive for diarrhea. Negative for blood in stool.  All other systems reviewed and are negative.   Family History  Problem Relation Age of Onset   Arthritis Mother    Hypertension Mother    Diabetes Mother    Cancer Mother    Arthritis Father    Hypertension Father    Diabetes Father    Sleep apnea Father    Obesity Father    Arthritis Maternal Grandmother    Diabetes Maternal Grandmother    Stroke Maternal Grandmother    Arthritis Paternal Grandmother    Diabetes Paternal Grandmother    Cancer Paternal Grandmother        lung, smoker   Diabetes Paternal Grandfather    Heart disease Paternal  Grandfather 34       MI   Hyperlipidemia Brother    Hypertension Brother    Diabetes Brother    COPD Maternal Grandfather    Kidney disease Neg Hx     Past Medical History:  Diagnosis Date   Arthralgia 01/11/2013   Diffuse, b/l Hips R>L Knees, ankles, low back   Arthritis    Back pain 12/08/2015   Cancer    Coronary artery disease    Diabetes mellitus type 2 in obese 11/09/2012   Dyslipidemia 11/09/2012   Esophageal reflux 12/08/2015   Feeling grief 06/09/2020   History of migraine headaches    Hyperglycemia 11/09/2012   Hypertension     Past Surgical History:  Procedure Laterality Date   DIVERTING ILEOSTOMY N/A 08/23/2022   Procedure: DIVERTING LOOP ILEOSTOMY;  Surgeon: Andria Meuse, MD;  Location: [facility];  Service: General;  Laterality: N/A;   EUS N/A 06/21/2022   Procedure: LOWER ENDOSCOPIC ULTRASOUND (EUS);  Surgeon: Lemar Lofty., MD;  Location: [facility];  Service: Gastroenterology;  Laterality: N/A;   FLEXIBLE SIGMOIDOSCOPY N/A 06/21/2022   Procedure: FLEXIBLE SIGMOIDOSCOPY;  Surgeon: Meridee Score Netty Starring., MD;  Location: [facility];  Service: Gastroenterology;  Laterality: N/A;   FLEXIBLE SIGMOIDOSCOPY N/A 08/23/2022   Procedure: FLEXIBLE SIGMOIDOSCOPY;  Surgeon: Andria Meuse, MD;  Location: [facility];  Service: General;  Laterality: N/A;   PORTACATH  PLACEMENT N/A 09/21/2022   Procedure: INSERTION PORT-A-CATH WITH ULTRASOUND GUIDANCE;  Surgeon: Andria Meuse, MD;  Location: [facility];  Service: General;  Laterality: N/A;   XI ROBOTIC ASSISTED LOWER ANTERIOR RESECTION N/A 08/23/2022   Procedure: XI ROBOTIC ASSISTED LOWER ANTERIOR RESECTION with Introperative assessment of profusion and TAP Block;  Surgeon: Andria Meuse, MD;  Location: [facility];  Service: General;  Laterality: N/A;    Social History:  reports that he quit smoking about 27 years ago. His smoking use included cigarettes. He has a 15.00 pack-year smoking history. He has  never used smokeless tobacco. He reports current alcohol use of about 6.0 - 10.0 standard drinks of alcohol per week. He reports current drug use. Drug: Marijuana.  Allergies:  Allergies  Allergen Reactions   Advil [Ibuprofen] Shortness Of Breath   Aleve [Naproxen] Shortness Of Breath   Mucinex [Guaifenesin Er] Shortness Of Breath, Itching, Swelling, Dermatitis, Rash and Other (See Comments)    Swelling of hands and feet   Nsaids Shortness Of Breath, Itching, Swelling, Rash and Other (See Comments)    Aleve and Advil    Latex Rash and Other (See Comments)    Cartilage will harden     (Not in a hospital admission)    Physical Exam: Blood pressure (!) 140/90, pulse 73, temperature (!) 97.5 F (36.4 C), temperature source Oral, resp. rate 16, height  (1.905 m), weight 110 kg, SpO2 98 %. General: pleasant, WD, WN white male who is laying in bed in NAD HEENT: head is normocephalic, atraumatic.  Sclera are noninjected.  PERRL.  Ears and nose without any masses or lesions.  Mouth is pink and moist Heart: regular, rate, and rhythm.  Normal s1,s2. No obvious murmurs, gallops, or rubs noted.  Palpable radial and pedal pulses bilaterally Lungs: CTAB, no wheezes, rhonchi, or rales noted.  Respiratory effort nonlabored Abd: soft, NT, ND, +BS, no masses, hernias, or organomegaly,  ileostomy with no output as he just emptied his bag.  Stoma is pink and viable Psych: A&Ox3 with an appropriate affect.   Results for orders placed or performed in visit on 12/04/22 (from the past 48 hour(s))  CMP (Cancer Center only)     Status: Abnormal   Collection Time: 12/04/22 10:27 AM  Result Value Ref Range   Sodium 137 135 - 145 mmol/L   Potassium 3.4 (L) 3.5 - 5.1 mmol/L   Chloride 103 98 - 111 mmol/L   CO2 24 22 - 32 mmol/L   Glucose, Bld 192 (H) 70 - 99 mg/dL    Comment: Glucose reference range applies only to samples taken after fasting for at least 8 hours.   BUN 21 (H) 6 - 20 mg/dL    Creatinine 1.61 (H) 0.61 - 1.24 mg/dL   Calcium 09.6 8.9 - 04.5 mg/dL   Total Protein 7.5 6.5 - 8.1 g/dL   Albumin 4.3 3.5 - 5.0 g/dL   AST 40 15 - 41 U/L   ALT 36 0 - 44 U/L   Alkaline Phosphatase 69 38 - 126 U/L   Total Bilirubin 0.7 0.3 - 1.2 mg/dL   GFR, Estimated 36 (L) >60 mL/min    Comment: (NOTE) Calculated using the CKD-EPI Creatinine Equation (2021)    Anion gap 10 5 - 15    Comment: Performed at [facility], [address], [place], [place] [address]  CBC with Differential (Cancer Center Only)     Status: Abnormal   Collection Time: 12/04/22  10:27 AM  Result Value Ref Range   WBC Count 7.1 4.0 - 10.5 K/uL   RBC 5.00 4.22 - 5.81 MIL/uL   Hemoglobin 13.7 13.0 - 17.0 g/dL   HCT 16.1 09.6 - 04.5 %   MCV 78.8 (L) 80.0 - 100.0 fL   MCH 27.4 26.0 - 34.0 pg   MCHC 34.8 30.0 - 36.0 g/dL   RDW 40.9 (H) 81.1 - 91.4 %   Platelet Count 113 (L) 150 - 400 K/uL   nRBC 0.0 0.0 - 0.2 %   Neutrophils Relative % 61 %   Neutro Abs 4.4 1.7 - 7.7 K/uL   Lymphocytes Relative 24 %   Lymphs Abs 1.7 0.7 - 4.0 K/uL   Monocytes Relative 11 %   Monocytes Absolute 0.8 0.1 - 1.0 K/uL   Eosinophils Relative 2 %   Eosinophils Absolute 0.1 0.0 - 0.5 K/uL   Basophils Relative 1 %   Basophils Absolute 0.0 0.0 - 0.1 K/uL   Immature Granulocytes 1 %   Abs Immature Granulocytes 0.04 0.00 - 0.07 K/uL    Comment: Performed at [facility], [address], [place], [place] [address]   No results found.    Assessment/Plan Rectal adenocarcinoma s/p LAR and diverting ileostomy by Dr. Cliffton Asters 08/23/2022, currently on chemotherapy, who presents with AKi due to high ileostomy output. The patient has been seen, examined, vitals, and labs reviewed. Recommend admission to the medical service for management of AKI. Recommend starting daily fiber supplement along with imodium 2 mg BID.  We discussed that even though this may seem more difficult to manage, this  will help keep him out of the hospital for dehydration issues and electrolyte abnormalities.   This plan was discussed with the patient as well as the admitting provider also at the bedside.  FEN - regular diet/ensure, IVF per primary, K 3.4  VTE - SCD's, ok for lovenox from surgical perspective  ID - none indicated  Admit - [facility] service   I reviewed nursing notes, ED provider notes, Consultant oncology notes, last 24 h vitals and pain scores, last 48 h intake and output, last 24 h labs and trends, and last 24 h imaging results.  Letha Cape, [facility] 12/04/2022, 3:12 PM Please see Amion for pager number during day hours 7:00am-4:30pm or 7:00am -11:30am on weekends

## 2022-12-04 NOTE — ED Triage Notes (Signed)
Patient brought over from the cancer center for severe dehydration and acute kidney injury. Has had diarrhea and his ileostomy has been putting out watery stool. Patient has colorectal cancer, currently getting chemo.   Has his port accessed on arrival and labs drawn today. Creatinine was elevated.

## 2022-12-04 NOTE — Telephone Encounter (Signed)
Called pt lvm @ needing follow up to our office back  if he would like ER f/u with Dr. Abner Greenspan call to make appt.

## 2022-12-04 NOTE — Progress Notes (Signed)
Patient Care Team: Bradd Canary, MD as PCP - General (Family Medicine) Christell Constant, MD as PCP - Cardiology (Cardiology) Malachy Mood, MD as Consulting Physician (Oncology) Pollyann Samples, NP as Nurse Practitioner (Oncology) Marily Lente, RN as Oncology Nurse Navigator (Oncology)   CHIEF COMPLAINT: High ostomy output   Oncology History  Rectal cancer  05/09/2022 Procedure   Colonoscopy by Dr. Russella Dar, impression - Five 5 to 7 mm polyps in the rectum, in the transverse colon and at the hepatic flexure, removed with a cold snare. Resected and retrieved. removed with a cold snare. Resected and retrieved. 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 Submucosal injection tattoo. - Internal hemorrhoids. - The examination was otherwise normal on direct and retroflexion views.   05/09/2022 Initial Biopsy   Diagnosis 1. Surgical [P], colon, transverse x1 and hepatic flexure x 3 (2 pieces only, 2 stuck together taken off together), polyp (4) - TUBULAR ADENOMA(S) - NEGATIVE FOR HIGH-GRADE DYSPLASIA OR MALIGNANCY 2. Surgical [P], colon, rectal mass - INVASIVE MODERATELY DIFFERENTIATED ADENOCARCINOMA. SEE NOTE 3. Surgical [P], colon, rectum, polyp (1) - HYPERPLASTIC POLYP   05/09/2022 Tumor Marker   CEA < 2.0   05/17/2022 Imaging   IMPRESSION: 1. Known primary malignancy is not well visualized. Correlate with recent colonoscopy. 2. No evidence of metastatic disease in the chest, abdomen or pelvis. 3. Mildly enlarged bilateral inguinal lymph nodes, likely reactive. 4. 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 Recommend annual imaging followup by CTA or MRA. This recommendation follows 2010 ACCF/AHA/AATS/ACR/ASA/SCA/SCAI/SIR/STS/SVM Guidelines for the Diagnosis and Management of Patients with Thoracic Aortic Disease. Circulation. 2010; 121: W098-J191. 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 Severe coronary artery calcifications of the LAD and circumflex. 6.  Aortic Atherosclerosis (ICD10-I70.0).   05/23/2022 Initial Diagnosis   Rectal cancer (HCC)   08/23/2022 Cancer Staging   Staging form: Colon and Rectum, AJCC 8th Edition - Pathologic stage from 08/23/2022: Stage IIA (pT3, pN0, cM0) - Signed by Pollyann Samples, NP on 09/10/2022 Stage prefix: Initial diagnosis Total positive nodes: 0   08/23/2022 Surgery   PROCEDURE:  Robotic assisted low anterior resection with diverting loop ileostomy SURGEON: Stephanie Coup. Cliffton Asters, MD ASSISTANT: Romie Levee, MD   08/23/2022 Pathology Results   FINAL MICROSCOPIC DIAGNOSIS:  A. COLON, RECTOSIGMOID, RESECTION:  - Invasive moderately differentiated adenocarcinoma, 2.5 cm, involving rectum  - Carcinoma invades focally into the perirectal adipose tissue  - Resection margins are negative for carcinoma  - Negative for lymphovascular invasion  - Fourteen lymph nodes, negative for carcinoma (0/14)  - See oncology table  B. DISTAL MARGIN DONUT:  - Rectal donut, negative for carcinoma   Regional Lymph Nodes:       Number of Lymph Nodes with Tumor: 0       Number of Lymph Nodes Examined: 14  Pathologic Stage Classification (pTNM, AJCC 8th Edition): pT3, pN0  Mismatch Repair Protein (IHC)  SUMMARY INTERPRETATION: NORMAL  MSI-Stable   09/27/2022 - 09/27/2022 Chemotherapy   Patient is on Treatment Plan : RECTAL Xelox (Capeox) (130/850) q21d x 6 cycles     10/10/2022 -  Chemotherapy   Patient is on Treatment Plan : COLORECTAL FOLFOX q14d x 3 months        CURRENT THERAPY: Adjuvant chemo FOLFOX x3 months starting 10/10/22  INTERVAL HISTORY  Ms. Bommarito presents for symptom management visit.  Last seen by Dr. Mosetta Putt 11/21/2022 with cycle 4 FOLFOX.  He came in for [facility] 4/19 and was admitted for  AKI Scr 2.11 and dehydration from high ostomy output.  GI panel was negative.  He was hydrated creatinine improved to baseline 1.6 and he was discharged 4/21.  He takes Imodium 1-2 times daily, admit it does not really slow  down the output but changes the consistency to paste which is harder to empty/manage.  Also dealing with some mild nausea, Compazine helps better than Zofran but he alternates.  He is drinking approximately a liter of fluids per day, and eating small amounts.  His reflux flared, taking PPI in the morning but does not think it is absorbing well because his stool is liquid at that time, and thickened throughout the day.  He took Protonix last night which seemed to help better. Stool remains watery, emptied bag 6 times over 5 hours yesterday. Trying to drink ~1 liter per day of water and electrolyte drink but he doesn't think he can keep up. He tried to work yesterday but felt very tired and couldn't.  ROS  All other systems reviewed and negative  Past Medical History:  Diagnosis Date   Arthralgia 01/11/2013   Diffuse, b/l Hips R>L Knees, ankles, low back   Arthritis    Back pain 12/08/2015   Cancer    Coronary artery disease    Diabetes mellitus type 2 in obese 11/09/2012   Dyslipidemia 11/09/2012   Esophageal reflux 12/08/2015   Feeling grief 06/09/2020   History of migraine headaches    Hyperglycemia 11/09/2012   Hypertension      Past Surgical History:  Procedure Laterality Date   DIVERTING ILEOSTOMY N/A 08/23/2022   Procedure: DIVERTING LOOP ILEOSTOMY;  Surgeon: Andria Meuse, MD;  Location: [facility];  Service: General;  Laterality: N/A;   EUS N/A 06/21/2022   Procedure: LOWER ENDOSCOPIC ULTRASOUND (EUS);  Surgeon: Lemar Lofty., MD;  Location: [facility];  Service: Gastroenterology;  Laterality: N/A;   FLEXIBLE SIGMOIDOSCOPY N/A 06/21/2022   Procedure: FLEXIBLE SIGMOIDOSCOPY;  Surgeon: Meridee Score Netty Starring., MD;  Location: [facility];  Service: Gastroenterology;  Laterality: N/A;   FLEXIBLE SIGMOIDOSCOPY N/A 08/23/2022   Procedure: FLEXIBLE SIGMOIDOSCOPY;  Surgeon: Andria Meuse, MD;  Location: [facility];  Service: General;  Laterality: N/A;   PORTACATH  PLACEMENT N/A 09/21/2022   Procedure: INSERTION PORT-A-CATH WITH ULTRASOUND GUIDANCE;  Surgeon: Andria Meuse, MD;  Location: [facility];  Service: General;  Laterality: N/A;   XI ROBOTIC ASSISTED LOWER ANTERIOR RESECTION N/A 08/23/2022   Procedure: XI ROBOTIC ASSISTED LOWER ANTERIOR RESECTION with Introperative assessment of profusion and TAP Block;  Surgeon: Andria Meuse, MD;  Location: [facility];  Service: General;  Laterality: N/A;     Outpatient Encounter Medications as of 12/04/2022  Medication Sig   amLODipine (NORVASC) 5 MG tablet Take 5 mg by mouth daily as needed (as directed for a systolic number reading gretaer than 150).   carvedilol (COREG) 25 MG tablet Take 1 tablet (25 mg total) by mouth 2 (two) times daily.   glucose blood (COOL BLOOD GLUCOSE TEST STRIPS) test strip Check blood sugar once daily   lidocaine-prilocaine (EMLA) cream Apply 1 Application topically as needed (for port access).   loperamide (IMODIUM) 2 MG capsule Take 1 capsule (2 mg total) by mouth 2 (two) times daily. (Patient taking differently: Take 2 mg by mouth in the morning.)   metFORMIN (GLUCOPHAGE) 500 MG tablet Take 1 tablet (500 mg total) by mouth daily with breakfast. (Patient taking differently: Take 500 mg by mouth at bedtime.)   ondansetron (ZOFRAN)  4 MG tablet Take 1 tablet (4 mg total) by mouth every 6 (six) hours as needed for nausea.   ondansetron (ZOFRAN) 8 MG tablet Take 8 mg by mouth every 8 (eight) hours as needed for nausea or vomiting.   pantoprazole (PROTONIX) 40 MG tablet Take 1 tablet (40 mg total) by mouth daily. (Patient taking differently: Take 40 mg by mouth daily before breakfast.)   prochlorperazine (COMPAZINE) 10 MG tablet Take 10 mg by mouth every 6 (six) hours as needed for nausea or vomiting.   sitaGLIPtin (JANUVIA) 100 MG tablet Take 1 tablet (100 mg total) by mouth daily.   tiZANidine (ZANAFLEX) 2 MG tablet Take 0.5-2 tablets (1-4 mg total) by mouth 2 (two) times daily as  needed for muscle spasms. (Patient taking differently: Take 2-4 mg by mouth 3 (three) times daily as needed for muscle spasms.)   Facility-Administered Encounter Medications as of 12/04/2022  Medication   heparin lock flush 100 unit/mL   sodium chloride flush (NS) 0.9 % injection 10 mL     There were no vitals filed for this visit. There is no height or weight on file to calculate BMI.   PHYSICAL EXAM GENERAL:alert, no distress and comfortable SKIN: no rash  EYES: sclera clear LUNGS: clear with normal breathing effort HEART: regular rate & rhythm, no lower extremity edema ABDOMEN: abdomen soft, non-tender and normal bowel sounds PAC without erythema    CBC    Component Value Date/Time   WBC 7.1 12/04/2022 1027   WBC 5.0 12/01/2022 0350   RBC 5.00 12/04/2022 1027   HGB 13.7 12/04/2022 1027   HCT 39.4 12/04/2022 1027   PLT 113 (L) 12/04/2022 1027   MCV 78.8 (L) 12/04/2022 1027   MCH 27.4 12/04/2022 1027   MCHC 34.8 12/04/2022 1027   RDW 21.0 (H) 12/04/2022 1027   LYMPHSABS 1.7 12/04/2022 1027   MONOABS 0.8 12/04/2022 1027   EOSABS 0.1 12/04/2022 1027   BASOSABS 0.0 12/04/2022 1027     CMP     Component Value Date/Time   NA 137 12/04/2022 1027   K 3.4 (L) 12/04/2022 1027   CL 103 12/04/2022 1027   CO2 24 12/04/2022 1027   GLUCOSE 192 (H) 12/04/2022 1027   BUN 21 (H) 12/04/2022 1027   CREATININE 2.15 (H) 12/04/2022 1027   CREATININE 1.19 06/09/2020 1613   CALCIUM 10.0 12/04/2022 1027   PROT 7.5 12/04/2022 1027   ALBUMIN 4.3 12/04/2022 1027   AST 40 12/04/2022 1027   ALT 36 12/04/2022 1027   ALKPHOS 69 12/04/2022 1027   BILITOT 0.7 12/04/2022 1027   GFRNONAA 36 (L) 12/04/2022 1027     ASSESSMENT & PLAN: 52 year old male   AKI, secondary to high ileostomy output and poor po intake -Recently admitted 4/19 - 4/21 for AKI (Scr 2.11), hydrated and managed with anti-emetics and PPI. Discharge Scr 1.6 -over the past 2 days at home he continues to have high ostomy  output, ~1-2 L yesterday, and stool is water today -not taking as much imodium as recommended, he doesn't like paste consistency because harder to clear from bag  -Nausea and GERD also hindering po intake but with recurrent AKI not safe to take PPI currently -Mr. Chiu appears weak, dehydrated, with recurrent AKI from high ileostomy output. Scr back up to 2.15 today -I recommend hospital admission for rehydration and ostomy management. I spoke with Dr. Cliffton Asters in colorectal surgery who agrees and available for consult for ostomy management if needed  Moderately differentiated adenocarcinoma  of the rectum -We reviewed his medical record in detail with the patient and his wife.  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 CT negative for distant metastasis -local staging MRI showed T1/T2, N0 rectal cancer and likely reactive inguinal nodes.  -rectal EUS showed uT2, uN0 disease, case reviewed in multidisciplinary conference and the recommendation was for upfront surgery -Proceeded with LAR and diverting ileostomy with Dr. Cliffton Asters on 08/23/2022. I reviewed surgical path which showed invasive grade 2 adenocarcinoma spanning 2.5 cm involving the rectum with carcinoma invading focally into the perirectal adipose tissue.  0/14 lymph nodes involved with clear margins and no LVI. Stage pT3 pN0, IIA, with no high risk features, MMR normal and MSI stable  -Case again discussed in conference and the recommendation is for 3 months adjuvant chemo. Dr. Mitzi Hansen felt that he could forego radiation -Began adjuvant FOLFOX 10/10/2022, s/p 4 cycles last given 11/21/22. Scheduled for cycle 5 (of 6 ) 4/25 but will postpone/cancel   3. IDA -Secondary to #1 -Started oral iron in 03/2022. Tried taking with vit C and separating from reflux meds.  Eventually he can not tolerate oral iron and stopped -hgb has been improving since surgery; no longer taking oral iron.  -Hgb continues to improve   4. incidental findings on rectal staging: Enlarged thoracic aorta and coronary artery calcifications -Met with cardiothoracic surgery, this is being monitored. No plan for surgery at this time -Further cardiac work up from 10/22/22 per Dr. Izora Ribas showed stability from 07/2022 and LGE, but given good heart function cardiology does not feel this is related to FOLFOX (only had 1 cycle at the time of imaging) -Continue cardiology f/up   5. Chronic conditions: HTN, HL, DM, GERD, former tobacco and current alcohol use -On amlodipine, carvedilol, losartan, metformin, and esomeprazole -Last hemoglobin A1c 7.2 in 10/2021 -He quit smoking more than 20 years ago and has decreased his alcohol intake.   -Continues to cut back on alcohol, caffeine, and sugar intake -F/up PCP    PLAN: -Labs reviewed, recurrent AKI with Scr 2.15 -1 L IVF and 2 tabs lomotil in clinic -Requesting admission by hospitalist team, no beds currently, will go through to ED -Once admitted consult surgery for ileostomy management, I spoke to Dr. Cliffton Asters who agrees with admission and can see patient if needed -Hold chemo    All questions were answered. The patient knows to call the clinic with any problems, questions or concerns. No barriers to learning were detected. I spent 30 minutes counseling the patient face to face. The total time spent in the appointment was 40 minutes and more than 50% was on counseling, review of test results, and coordination of care.   Santiago Glad, NP-C 12/04/2022

## 2022-12-05 ENCOUNTER — Other Ambulatory Visit: Payer: Self-pay

## 2022-12-05 ENCOUNTER — Other Ambulatory Visit: Payer: Self-pay | Admitting: Hematology

## 2022-12-05 DIAGNOSIS — Z91040 Latex allergy status: Secondary | ICD-10-CM | POA: Diagnosis not present

## 2022-12-05 DIAGNOSIS — I10 Essential (primary) hypertension: Secondary | ICD-10-CM | POA: Diagnosis present

## 2022-12-05 DIAGNOSIS — K219 Gastro-esophageal reflux disease without esophagitis: Secondary | ICD-10-CM | POA: Diagnosis present

## 2022-12-05 DIAGNOSIS — E876 Hypokalemia: Secondary | ICD-10-CM | POA: Diagnosis not present

## 2022-12-05 DIAGNOSIS — E119 Type 2 diabetes mellitus without complications: Secondary | ICD-10-CM | POA: Diagnosis present

## 2022-12-05 DIAGNOSIS — Z888 Allergy status to other drugs, medicaments and biological substances status: Secondary | ICD-10-CM | POA: Diagnosis not present

## 2022-12-05 DIAGNOSIS — Z87891 Personal history of nicotine dependence: Secondary | ICD-10-CM | POA: Diagnosis not present

## 2022-12-05 DIAGNOSIS — K9419 Other complications of enterostomy: Secondary | ICD-10-CM | POA: Diagnosis present

## 2022-12-05 DIAGNOSIS — I2510 Atherosclerotic heart disease of native coronary artery without angina pectoris: Secondary | ICD-10-CM | POA: Diagnosis present

## 2022-12-05 DIAGNOSIS — T451X5A Adverse effect of antineoplastic and immunosuppressive drugs, initial encounter: Secondary | ICD-10-CM | POA: Diagnosis present

## 2022-12-05 DIAGNOSIS — Z79899 Other long term (current) drug therapy: Secondary | ICD-10-CM | POA: Diagnosis not present

## 2022-12-05 DIAGNOSIS — Z8249 Family history of ischemic heart disease and other diseases of the circulatory system: Secondary | ICD-10-CM | POA: Diagnosis not present

## 2022-12-05 DIAGNOSIS — N179 Acute kidney failure, unspecified: Secondary | ICD-10-CM | POA: Diagnosis present

## 2022-12-05 DIAGNOSIS — Z833 Family history of diabetes mellitus: Secondary | ICD-10-CM | POA: Diagnosis not present

## 2022-12-05 DIAGNOSIS — E669 Obesity, unspecified: Secondary | ICD-10-CM | POA: Diagnosis present

## 2022-12-05 DIAGNOSIS — C20 Malignant neoplasm of rectum: Secondary | ICD-10-CM

## 2022-12-05 DIAGNOSIS — Z886 Allergy status to analgesic agent status: Secondary | ICD-10-CM | POA: Diagnosis not present

## 2022-12-05 DIAGNOSIS — E785 Hyperlipidemia, unspecified: Secondary | ICD-10-CM | POA: Diagnosis present

## 2022-12-05 DIAGNOSIS — Z7984 Long term (current) use of oral hypoglycemic drugs: Secondary | ICD-10-CM | POA: Diagnosis not present

## 2022-12-05 DIAGNOSIS — Z95828 Presence of other vascular implants and grafts: Secondary | ICD-10-CM | POA: Diagnosis not present

## 2022-12-05 DIAGNOSIS — Z825 Family history of asthma and other chronic lower respiratory diseases: Secondary | ICD-10-CM | POA: Diagnosis not present

## 2022-12-05 DIAGNOSIS — E860 Dehydration: Secondary | ICD-10-CM | POA: Diagnosis present

## 2022-12-05 DIAGNOSIS — Z823 Family history of stroke: Secondary | ICD-10-CM | POA: Diagnosis not present

## 2022-12-05 LAB — CBC
HCT: 35.2 % — ABNORMAL LOW (ref 39.0–52.0)
Hemoglobin: 11.4 g/dL — ABNORMAL LOW (ref 13.0–17.0)
MCH: 26.9 pg (ref 26.0–34.0)
MCHC: 32.4 g/dL (ref 30.0–36.0)
MCV: 83 fL (ref 80.0–100.0)
Platelets: 102 10*3/uL — ABNORMAL LOW (ref 150–400)
RBC: 4.24 MIL/uL (ref 4.22–5.81)
RDW: 21.3 % — ABNORMAL HIGH (ref 11.5–15.5)
WBC: 5.5 10*3/uL (ref 4.0–10.5)
nRBC: 0 % (ref 0.0–0.2)

## 2022-12-05 LAB — BASIC METABOLIC PANEL
Anion gap: 8 (ref 5–15)
BUN: 18 mg/dL (ref 6–20)
CO2: 21 mmol/L — ABNORMAL LOW (ref 22–32)
Calcium: 8.7 mg/dL — ABNORMAL LOW (ref 8.9–10.3)
Chloride: 107 mmol/L (ref 98–111)
Creatinine, Ser: 1.67 mg/dL — ABNORMAL HIGH (ref 0.61–1.24)
GFR, Estimated: 49 mL/min — ABNORMAL LOW (ref >60)
Glucose, Bld: 146 mg/dL — ABNORMAL HIGH (ref 70–99)
Potassium: 3.3 mmol/L — ABNORMAL LOW (ref 3.5–5.1)
Sodium: 136 mmol/L (ref 135–145)

## 2022-12-05 LAB — GLUCOSE, CAPILLARY
Glucose-Capillary: 106 mg/dL — ABNORMAL HIGH (ref 70–99)
Glucose-Capillary: 127 mg/dL — ABNORMAL HIGH (ref 70–99)
Glucose-Capillary: 113 mg/dL — ABNORMAL HIGH (ref 70–99)
Glucose-Capillary: 129 mg/dL — ABNORMAL HIGH (ref 70–99)

## 2022-12-05 MED ORDER — PROSOURCE PLUS PO LIQD
30.0000 mL | Freq: Two times a day (BID) | ORAL | Status: DC
  Administered 2022-12-05 – 2022-12-06 (×2): 30 mL via ORAL
  Filled 2022-12-05 (×7): qty 30

## 2022-12-05 MED ORDER — CHLORHEXIDINE GLUCONATE CLOTH 2 % EX PADS
6.0000 | MEDICATED_PAD | Freq: Every day | CUTANEOUS | Status: DC
  Administered 2022-12-05 – 2022-12-10 (×6): 6 via TOPICAL

## 2022-12-05 MED ORDER — SODIUM CHLORIDE 0.9% FLUSH
10.0000 mL | INTRAVENOUS | Status: DC | PRN
  Administered 2022-12-07: 10 mL

## 2022-12-05 MED ORDER — POTASSIUM CHLORIDE CRYS ER 20 MEQ PO TBCR
30.0000 meq | EXTENDED_RELEASE_TABLET | ORAL | Status: AC
  Administered 2022-12-05 (×2): 30 meq via ORAL
  Filled 2022-12-05 (×2): qty 1

## 2022-12-05 MED ORDER — CALCIUM POLYCARBOPHIL 625 MG PO TABS
625.0000 mg | ORAL_TABLET | Freq: Every day | ORAL | Status: DC
  Administered 2022-12-05 – 2022-12-10 (×6): 625 mg via ORAL
  Filled 2022-12-05 (×6): qty 1

## 2022-12-05 NOTE — Progress Notes (Signed)
  Transition of Care (TOC) Screening Note   Patient Details  Name: Robert [name] Date of Birth: 11/13/1970   Transition of Care [facility]) CM/SW Contact:    Otelia Santee, LCSW Phone Number: 12/05/2022, 11:02 AM    Transition of Care Department [facility]) has reviewed patient and no TOC needs have been identified at this time. We will continue to monitor patient advancement through interdisciplinary progression rounds. If new patient transition needs arise, please place a TOC consult.

## 2022-12-05 NOTE — Progress Notes (Signed)
Robert [name]   DOB:[birth year]   ZO#:[id number]   JWJ#:[id number]  Hem/Onc follow up   Subjective: pt is well-known to me, under my care for his rectal cancer. He is on adjuvant chemo. He was admitted from my office yesterday for AKI and worsening diarrhea with high ileostomy output.  He has diarrhea is getting better with Imodium, his ileostomy output was about today by 5pm.  No fevers, cramps, or nausea.  He is tolerating liquid well.   Objective:  Vitals:   12/05/22 1915 12/05/22 2042  BP: (!) 160/90 127/84  Pulse: 80 70  Resp: 18   Temp: 98.7 F (37.1 C)   SpO2: 100%     Body mass index is 30.31 kg/m.  Intake/Output Summary (Last 24 hours) at 12/05/2022 2147 Last data filed at 12/05/2022 1700 Gross per 24 hour  Intake 2126.07 ml  Output 1670 ml  Net 456.07 ml     Sclerae unicteric  Oropharynx clear  MSK no focal spinal tenderness, no peripheral edema  Neuro nonfocal    CBG (last 3)  Recent Labs    12/05/22 1154 12/05/22 1643 12/05/22 2126  GLUCAP 127* 113* 129*     Labs:  Urine Studies No results for input(s): "UHGB", "CRYS" in the last 72 hours.  Invalid input(s): "UACOL", "UAPR", "USPG", "UPH", "UTP", "UGL", "UKET", "UBIL", "UNIT", "UROB", "ULEU", "UEPI", "UWBC", "URBC", "UBAC", "CAST", "UCOM", "BILUA"  Basic Metabolic Panel: Recent Labs  Lab 11/30/22 1145 12/01/22 0350 12/02/22 1012 12/04/22 1027 12/05/22 0834  NA 133* 132* 134* 137 136  K 3.8 3.1* 3.5 3.4* 3.3*  CL 100 102 105 103 107  CO2 25 22 20* 24 21*  GLUCOSE 159* 122* 150* 192* 146*  BUN 35* 32* 18 21* 18  CREATININE 2.11* 1.92* 1.68* 2.15* 1.67*  CALCIUM 10.4* 8.8* 8.6* 10.0 8.7*  MG 1.8  --  1.6*  --   --    GFR Estimated Creatinine Clearance: 68.5 mL/min (A) (by C-G formula based on SCr of 1.67 mg/dL (H)). Liver Function Tests: Recent Labs  Lab 11/30/22 1145 12/01/22 0350 12/02/22 1012 12/04/22 1027  AST 36 30 27 40  ALT 36  ALKPHOS 85 62 59 69  BILITOT 0.8 0.6  0.2* 0.7  PROT 8.0 6.4* 5.5* 7.5  ALBUMIN 4.6 3.5 2.9* 4.3   No results for input(s): "LIPASE", "AMYLASE" in the last 168 hours. No results for input(s): "AMMONIA" in the last 168 hours. Coagulation profile No results for input(s): "INR", "PROTIME" in the last 168 hours.  CBC: Recent Labs  Lab 11/30/22 1145 12/01/22 0350 12/04/22 1027 12/05/22 0834  WBC 4.9 5.0 7.1 5.5  NEUTROABS 2.1  --  4.4  --   HGB 14.1 12.0* 13.7 11.4*  HCT 41.8 36.5* 39.4 35.2*  MCV 79.2* 80.4 78.8* 83.0  PLT 198 144* 113* 102*   Cardiac Enzymes: No results for input(s): "CKTOTAL", "CKMB", "CKMBINDEX", "TROPONINI" in the last 168 hours. BNP: Invalid input(s): "POCBNP" CBG: Recent Labs  Lab 12/04/22 2126 12/05/22 0732 12/05/22 1154 12/05/22 1643 12/05/22 2126  GLUCAP 89 106* 127* 113* 129*   D-Dimer No results for input(s): "DDIMER" in the last 72 hours. Hgb A1c No results for input(s): "HGBA1C" in the last 72 hours. Lipid Profile No results for input(s): "CHOL", "HDL", "LDLCALC", "TRIG", "CHOLHDL", "LDLDIRECT" in the last 72 hours. Thyroid function studies No results for input(s): "TSH", "T4TOTAL", "T3FREE", "THYROIDAB" in the last 72 hours.  Invalid input(s): "FREET3" Anemia work up No results for input(s): "  VITAMINB12", "FOLATE", "FERRITIN", "TIBC", "IRON", "RETICCTPCT" in the last 72 hours. Microbiology Recent Results (from the past 240 hour(s))  C difficile quick screen w PCR reflex     Status: None   Collection Time: 11/30/22  3:19 PM   Specimen: STOOL  Result Value Ref Range Status   C Diff antigen NEGATIVE NEGATIVE Final   C Diff toxin NEGATIVE NEGATIVE Final   C Diff interpretation No C. difficile detected.  Final    Comment: Performed at [facility], [address] [place], [place] [address]  Gastrointestinal Panel by PCR , Stool     Status: None   Collection Time: 11/30/22  3:22 PM  Result Value Ref Range Status   Campylobacter species NOT DETECTED NOT  DETECTED Final   Plesimonas shigelloides NOT DETECTED NOT DETECTED Final   Salmonella species NOT DETECTED NOT DETECTED Final   Yersinia enterocolitica NOT DETECTED NOT DETECTED Final   Vibrio species NOT DETECTED NOT DETECTED Final   Vibrio cholerae NOT DETECTED NOT DETECTED Final   Enteroaggregative E coli (EAEC) NOT DETECTED NOT DETECTED Final   Enteropathogenic E coli (EPEC) NOT DETECTED NOT DETECTED Final   Enterotoxigenic E coli (ETEC) NOT DETECTED NOT DETECTED Final   Shiga like toxin producing E coli (STEC) NOT DETECTED NOT DETECTED Final   Shigella/Enteroinvasive E coli (EIEC) NOT DETECTED NOT DETECTED Final   Cryptosporidium NOT DETECTED NOT DETECTED Final   Cyclospora cayetanensis NOT DETECTED NOT DETECTED Final   Entamoeba histolytica NOT DETECTED NOT DETECTED Final   Giardia lamblia NOT DETECTED NOT DETECTED Final   Adenovirus F40/41 NOT DETECTED NOT DETECTED Final   Astrovirus NOT DETECTED NOT DETECTED Final   Norovirus GI/GII NOT DETECTED NOT DETECTED Final   Rotavirus A NOT DETECTED NOT DETECTED Final   Sapovirus (I, II, IV, and V) NOT DETECTED NOT DETECTED Final    Comment: Performed at [facility], [address] [place], [place] [address]      Studies:  No results found.  Assessment: 52 y.o. male   AKI from dehydration  Diarrhea with high ileostomy output Hypokalemia Hypertension Diabetes, type II Rectal adenocarcinoma, stage II, on adjuvant chemotherapy FOLFOX    Plan:  -AKI and dehydration has resolved with IV fluids -He is on Imodium as needed, we discussed adding Lomotil if Imodium is not adequate.  -I again encouraged him to increase oral liquid intake. -Will cancel his chemotherapy scheduled for tomorrow, and take a chemo break for 2 weeks.  He still has 2 more cycles planned, hopefully he can get through with supportive care. -I will see him as needed in hospital.  He will come when he is ready to go home, and I will schedule lab and  IV fluids 1-2 times per week after discharge.   Malachy Mood, MD 12/05/2022

## 2022-12-05 NOTE — Progress Notes (Signed)
Progress Note     Subjective: Ostomy output seems to have slowed down some - about 1L out today so far which is less than it was Monday. He states he generally has more output in the mornings and it decreases through the day. Still with some nausea which is chemotherapy related but he thinks also exacerbated by reflux and he has noted improvement with taking his protonix at night   Objective: Vital signs in last 24 hours: Temp:  [97.5 F (36.4 C)-98.9 F (37.2 C)] 97.6 F (36.4 C) (04/24 0733) Pulse Rate:  [64-73] 64 (04/24 0733) Resp:  [16-20] 16 (04/24 0733) BP: (116-169)/(70-109) 135/83 (04/24 0733) SpO2:  [95 %-100 %] 98 % (04/24 0733) Weight:  [110 kg] 110 kg (04/23 1439) Last BM Date : 12/05/22  Intake/Output from previous day: 04/23 0701 - 04/24 0700 In: 50 [IV Piggyback:50] Out: -  Intake/Output this shift: No intake/output data recorded.  PE: General: pleasant, WD, male who is laying in bed in NAD Lungs: Respiratory effort nonlabored on room air Abd: soft, NT, ND, ileostomy with stoma pink and viable and liquid stool in bag MSK: all 4 extremities are symmetrical with no cyanosis, clubbing, or edema. Skin: warm and dry Psych: A&Ox3 with an appropriate affect.    Lab Results:  Recent Labs    12/04/22 1027 12/05/22 0834  WBC 7.1 5.5  HGB 13.7 11.4*  HCT 39.4 35.2*  PLT 113* 102*   BMET Recent Labs    12/04/22 1027 12/05/22 0834  NA 137 136  K 3.4* 3.3*  CL 103 107  CO2 24 21*  GLUCOSE 192* 146*  BUN 21* 18  CREATININE 2.15* 1.67*  CALCIUM 10.0 8.7*   PT/INR No results for input(s): "LABPROT", "INR" in the last 72 hours. CMP     Component Value Date/Time   NA 136 12/05/2022 0834   K 3.3 (L) 12/05/2022 0834   CL 107 12/05/2022 0834   CO2 21 (L) 12/05/2022 0834   GLUCOSE 146 (H) 12/05/2022 0834   BUN 18 12/05/2022 0834   CREATININE 1.67 (H) 12/05/2022 0834   CREATININE 2.15 (H) 12/04/2022 1027   CREATININE 1.19 06/09/2020 1613   CALCIUM  8.7 (L) 12/05/2022 0834   PROT 7.5 12/04/2022 1027   ALBUMIN 4.3 12/04/2022 1027   AST 40 12/04/2022 1027   ALT 36 12/04/2022 1027   ALKPHOS 69 12/04/2022 1027   BILITOT 0.7 12/04/2022 1027   GFRNONAA 49 (L) 12/05/2022 0834   GFRNONAA 36 (L) 12/04/2022 1027   Lipase     Component Value Date/Time   LIPASE 196 (H) 09/27/2022 0807       Studies/Results: No results found.  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 Cliffton Asters 08/23/2022, currently on chemotherapy, who presents with AKi due to high ileostomy output.   - AKI and electrolytes improved with IVF - starting to have decreasing ileostomy output - about 1L today. Continue to trend with strict I&O - continue fiber and bid imodium for now  FEN: carb mod ID: none VTE: okay for chemical ppx from surgical standpoint  I reviewed last 24 h vitals and pain scores, last 48 h intake and output, last 24 h labs and trends, and last 24 h imaging results.   LOS: 0 days   Eric Form, [facility] 12/05/2022, 10:27 AM Please see Amion for pager number during  day hours 7:00am-4:30pm

## 2022-12-05 NOTE — Progress Notes (Addendum)
PROGRESS NOTE    Robert [name]  ZOX:[id number] DOB: [birth year] DOA: 12/04/2022 PCP: Bradd Canary, MD    Brief Narrative:   Robert [name] is a 52 y.o. male with past medical history significant for CAD, HTN, HLD, DM2, rectal adenocarcinoma s/p robotic LAR with diverting loop ileostomy 08/23/2022 by Dr. Cliffton Asters on adjuvant chemotherapy who presented to [facility] ED on 4/23 by direction of outpatient oncology office for nausea, abdominal discomfort and concerns of recurrent high output ileostomy.  His chemotherapy session was postponed and was directed to the ED for further evaluation and management.  Recently admitted 4/19 - 4/21 for acute renal failure due to high ileostomy output.  He was supported with IV fluid hydration and started on Imodium with improvement of his creatinine back to his baseline.  Patient reports has not been utilizing the Imodium as directed due to causing his output to become like a paste; which was causing him increased difficulties caring for his ostomy.  In the ED, temperature 97.5 F, HR 73, RR 16, BP 140/90, SpO2 98% on room air.  WBC 7.1, hemoglobin 13.7, platelets 113.  Sodium 137, potassium 3.4, chloride 103, CO2 24, glucose 192, BUN 21, creatinine 2.15.  AST 40, ALT 36, total bilirubin 0.7.  General surgery was consulted.  Patient was given Imodium, Phenergan by EDP.  TRH consulted for admission for further evaluation management of acute renal failure secondary to high output ileostomy.  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  Creatinine on admission 2.15, recently discharged with creatinine 1.63.  Patient has been using only once daily Imodium due to twice daily causing "paste like output" in his ileostomy. -- General Surgery following, appreciate assistance -- Cr 2.15>1.67 -- NS  with 40 mEq KCL at 100 mL/h -- Imodium 2 mg p.o. twice daily -- Continue fiber supplementation -- Strict I's and O's, monitor ostomy output closely -- BMP daily  Hypokalemia Potassium 3.4, will continue repletion with IV fluids.  Etiology likely secondary to GI loss with high output ostomy. -- BMP in a.m. with magnesium  Essential hypertension -- Carvedilol 25 mg p.o. twice daily -- Amlodipine 5 g p.o. daily PRN SBP >150  Hyperlipidemia Currently not on statin outpatient  Type 2 diabetes mellitus Home regimen includes metformin 500 mg p.o. nightly, Januvia 100 mg p.o. daily.  Hemoglobin A1c 7.2 on 09/17/2022, failure well-controlled. -- Hold oral hypoglycemics while inpatient -- SSI for coverage -- CBGs qAC/HS  GERD -- Protonix 40 mg p.o. nightly -- Outpatient follow-up with GI, Dr. Russella Dar  Rectal adenocarcinoma Patient underwent robotic LAR with diverting loop ileostomy on 08/23/2022 by general surgery, Dr. Cliffton Asters.  Follows with medical oncology outpatient, Dr. Mosetta Putt.  Currently on chemotherapy with FOLFOX. -- Continue outpatient follow-up with medical oncology   DVT prophylaxis: SCDs Start: 12/04/22 1752    Code Status: Full Code Family Communication: No family present at bedside this morning  Disposition Plan:  Level of care: Med-Surg Status is: Inpatient Remains inpatient appropriate because: IV fluids, needs improvement of high output ileostomy with resolution of renal failure before stable for discharge home, anticipate 1-2 days    Consultants:  General surgery  Procedures:  None  Antimicrobials:  None   Subjective: Patient seen examined bedside, resting comfortably.  Lying in bed.  Reports slightly decreased ostomy output today.  Discussed with general surgery  PA.  Continues on Imodium twice daily and fiber supplementation.  Continues with IV fluids.  Creatinine improved today.  No other specific questions or concerns at this time.  Denies headache, no  dizziness, no chest pain, no palpitations, no shortness of breath, no current abdominal pain, no fever/chills/night sweats, no nausea/vomiting/diarrhea, no focal weakness, no fatigue, no paresthesias.  No acute events overnight per nursing staff.  Objective: Vitals:   12/05/22 0314 12/05/22 0657 12/05/22 0733 12/05/22 1237  BP: (!) 157/104 (!) 151/83 135/83 121/73  Pulse: 64 72 64 65  Resp: Temp: 98.1 F (36.7 C)  97.6 F (36.4 C) 98.2 F (36.8 C)  TempSrc: Oral  Oral Oral  SpO2: 95%  98% 97%  Weight:      Height:        Intake/Output Summary (Last 24 hours) at 12/05/2022 1342 Last data filed at 12/05/2022 1015 Gross per 24 hour  Intake 286 ml  Output 270 ml  Net 16 ml   Filed Weights   12/04/22 1439  Weight: 110 kg    Examination:  Physical Exam: GEN: NAD, alert and oriented x 3, obese HEENT: NCAT, PERRL, EOMI, sclera clear, MMM PULM: CTAB w/o wheezes/crackles, normal respiratory effort, on room air CV: RRR w/o M/G/R GI: abd soft, NTND, NABS, no R/G/M, colostomy noted with dark liquid stool in collection bag MSK: no peripheral edema, muscle strength globally intact 5/5 bilateral upper/lower extremities NEURO: CN II-XII intact, no focal deficits, sensation to light touch intact PSYCH: normal mood/affect Integumentary: dry/intact, no rashes or wounds    Data Reviewed: I have personally reviewed following labs and imaging studies  CBC: Recent Labs  Lab 11/30/22 1145 12/01/22 0350 12/04/22 1027 12/05/22 0834  WBC 4.9 5.0 7.1 5.5  NEUTROABS 2.1  --  4.4  --   HGB 14.1 12.0* 13.7 11.4*  HCT 41.8 36.5* 39.4 35.2*  MCV 79.2* 80.4 78.8* 83.0  PLT 198 144* 113* 102*   Basic Metabolic Panel: Recent Labs  Lab 11/30/22 1145 12/01/22 0350 12/02/22 1012 12/04/22 1027 12/05/22 0834  NA 133* 132* 134* 137 136  K 3.8 3.1* 3.5 3.4* 3.3*  CL 100 102 105 103 107  CO2 25 22 20* 24 21*  GLUCOSE 159* 122* 150* 192* 146*  BUN 35* 32* 18 21* 18  CREATININE  2.11* 1.92* 1.68* 2.15* 1.67*  CALCIUM 10.4* 8.8* 8.6* 10.0 8.7*  MG 1.8  --  1.6*  --   --    GFR: Estimated Creatinine Clearance: 68.5 mL/min (A) (by C-G formula based on SCr of 1.67 mg/dL (H)). Liver Function Tests: Recent Labs  Lab 11/30/22 1145 12/01/22 0350 12/02/22 1012 12/04/22 1027  AST 36 30 27 40  ALT 36  ALKPHOS 85 62 59 69  BILITOT 0.8 0.6 0.2* 0.7  PROT 8.0 6.4* 5.5* 7.5  ALBUMIN 4.6 3.5 2.9* 4.3   No results for input(s): "LIPASE", "AMYLASE" in the last 168 hours. No results for input(s): "AMMONIA" in the last 168 hours. Coagulation Profile: No results for input(s): "INR", "PROTIME" in the last 168 hours. Cardiac Enzymes: No results for input(s): "CKTOTAL", "CKMB", "CKMBINDEX", "TROPONINI" in the last 168 hours. BNP (last 3 results) No results for input(s): "PROBNP" in the last 8760 hours. HbA1C: No results for input(s): "HGBA1C" in the last 72 hours. CBG: Recent Labs  Lab 12/02/22 0755 12/02/22 1141 12/04/22 2126 12/05/22 0732 12/05/22 1154  GLUCAP 135* 160* 89 106* 127*   Lipid Profile: No  results for input(s): "CHOL", "HDL", "LDLCALC", "TRIG", "CHOLHDL", "LDLDIRECT" in the last 72 hours. Thyroid Function Tests: No results for input(s): "TSH", "T4TOTAL", "FREET4", "T3FREE", "THYROIDAB" in the last 72 hours. Anemia Panel: No results for input(s): "VITAMINB12", "FOLATE", "FERRITIN", "TIBC", "IRON", "RETICCTPCT" in the last 72 hours. Sepsis Labs: No results for input(s): "PROCALCITON", "LATICACIDVEN" in the last 168 hours.  Recent Results (from the past 240 hour(s))  C difficile quick screen w PCR reflex     Status: None   Collection Time: 11/30/22  3:19 PM   Specimen: STOOL  Result Value Ref Range Status   C Diff antigen NEGATIVE NEGATIVE Final   C Diff toxin NEGATIVE NEGATIVE Final   C Diff interpretation No C. difficile detected.  Final    Comment: Performed at [facility], [address], [place], [place]  [address]  Gastrointestinal Panel by PCR , Stool     Status: None   Collection Time: 11/30/22  3:22 PM  Result Value Ref Range Status   Campylobacter species NOT DETECTED NOT DETECTED Final   Plesimonas shigelloides NOT DETECTED NOT DETECTED Final   Salmonella species NOT DETECTED NOT DETECTED Final   Yersinia enterocolitica NOT DETECTED NOT DETECTED Final   Vibrio species NOT DETECTED NOT DETECTED Final   Vibrio cholerae NOT DETECTED NOT DETECTED Final   Enteroaggregative E coli (EAEC) NOT DETECTED NOT DETECTED Final   Enteropathogenic E coli (EPEC) NOT DETECTED NOT DETECTED Final   Enterotoxigenic E coli (ETEC) NOT DETECTED NOT DETECTED Final   Shiga like toxin producing E coli (STEC) NOT DETECTED NOT DETECTED Final   Shigella/Enteroinvasive E coli (EIEC) NOT DETECTED NOT DETECTED Final   Cryptosporidium NOT DETECTED NOT DETECTED Final   Cyclospora cayetanensis NOT DETECTED NOT DETECTED Final   Entamoeba histolytica NOT DETECTED NOT DETECTED Final   Giardia lamblia NOT DETECTED NOT DETECTED Final   Adenovirus F40/41 NOT DETECTED NOT DETECTED Final   Astrovirus NOT DETECTED NOT DETECTED Final   Norovirus GI/GII NOT DETECTED NOT DETECTED Final   Rotavirus A NOT DETECTED NOT DETECTED Final   Sapovirus (I, II, IV, and V) NOT DETECTED NOT DETECTED Final    Comment: Performed at [facility], [address] [place], [place] [address]         Radiology Studies: No results found.      Scheduled Meds:  (feeding supplement) PROSource Plus  30 mL Oral BID BM   carvedilol  25 mg Oral BID WC   Chlorhexidine Gluconate Cloth  6 each Topical Daily   feeding supplement  237 mL Oral BID BM   insulin aspart  0-15 Units Subcutaneous TID WC   insulin aspart  0-5 Units Subcutaneous QHS   loperamide  2 mg Oral BID   pantoprazole  40 mg Oral QPM   polycarbophil  625 mg Oral Daily   potassium chloride  30 mEq Oral Q4H   Continuous Infusions:  0.9 % NaCl with KCl 40 mEq / L 100  mL/hr at 12/05/22 0740     LOS: 0 days    Time spent: 51 minutes spent on chart review, discussion with nursing staff, consultants, updating family and interview/physical exam; more than 50% of that time was spent in counseling and/or coordination of care.    Alvira Philips [place], DO [facility] Available via Epic secure chat 7am-7pm After these hours, please refer to coverage provider listed on [email or web address] 12/05/2022, 1:42 PM

## 2022-12-05 NOTE — Progress Notes (Signed)
Initial Nutrition Assessment  INTERVENTION:   -Ensure Plus High Protein po BID, each supplement provides 350 kcal and 20 grams of protein.   -Prosource Plus PO BID, each provides 100 kcals and 15g protein  -Discussed protein supplement options for when pt goes home. Recommended daily chewable MVI.  NUTRITION DIAGNOSIS:   Increased nutrient needs related to cancer and cancer related treatments as evidenced by estimated needs.  GOAL:   Patient will meet greater than or equal to 90% of their needs  MONITOR:   PO intake, Supplement acceptance, Labs, Weight trends, I & O's  REASON FOR ASSESSMENT:   Malnutrition Screening Tool    ASSESSMENT:   52 y/o M with PMH of CAD, HTN, HDL, type 2 diabetes, rectal adenocarcinoma s/p Robotic LAR and diverting loop ileostomy 08/23/2022 by Dr. Cliffton Asters. Port-a-cath was placed 09/2022 and he was started on adjuvant chemotherapy with FOLFOX 10/10/22, last dose 4/10. He was recently admitted 4/19-4/21 for AKI due to high ileostomy output. Admitted 4/23 for the same.  Patient in room, states he doing better today. Thinks he has found a regimen that helps control his nausea to allow him to eat. Consumed 100% of breakfast this morning. PTA pt was not eating well for the last 1.5 weeks r/t chemotherapy  on 4/12. Pt was having increased nausea, was struggling to eat 1/2 of a typical meal which he was already attempting to consume 5-6 small meals daily. States he was given Ensure supplements at last dietitian visit and this caused his ostomy output to worsen. He is willing to keep trying them this admission to see if they have the same effect. We discussed other options to try. States he does use milk at home. Drank chocolate milk this morning with no reported problems. States he likes protein bars so this may be a suitable option for him if he cannot tolerate liquid supplements. Will order Prosource as well.  Recommended daily chewable MVI given his diet is limited in  fresh fruits and vegetables. States he cannot tolerate iron in supplements so will need to find one without iron.  Denies issues with swallowing or chewing.  Per weight records, pt has lost 23 lbs since 2/28 (8% wt loss x 2 months, significant for time frame).   Medications: Imodium, Fibercon, Zofran, Compazine  Labs reviewed: CBGs: 89-160 Low K   NUTRITION - FOCUSED PHYSICAL EXAM:  No depletions noted.  Diet Order:   Diet Order             Diet Carb Modified Fluid consistency: Thin; Room service appropriate? Yes  Diet effective now                   EDUCATION NEEDS:   Education needs have been addressed  Skin:  Skin Assessment: Reviewed RN Assessment  Last BM:  4/24 -ileostomy  Height:   Ht Readings from Last 1 Encounters:  12/04/22  (1.905 m)    Weight:   Wt Readings from Last 1 Encounters:  12/04/22 110 kg    BMI:  Body mass index is 30.31 kg/m.  Estimated Nutritional Needs:   Kcal:  4098-1191  Protein:  130-140g  Fluid:  2.4L/day  Tilda Franco, MS, RD, LDN Inpatient Clinical Dietitian Contact information available via Amion

## 2022-12-06 ENCOUNTER — Inpatient Hospital Stay: Payer: BLUE CROSS/BLUE SHIELD

## 2022-12-06 ENCOUNTER — Inpatient Hospital Stay: Payer: BLUE CROSS/BLUE SHIELD | Admitting: Nurse Practitioner

## 2022-12-06 DIAGNOSIS — N179 Acute kidney failure, unspecified: Secondary | ICD-10-CM | POA: Diagnosis not present

## 2022-12-06 LAB — BASIC METABOLIC PANEL
Anion gap: 6 (ref 5–15)
BUN: 17 mg/dL (ref 6–20)
CO2: 21 mmol/L — ABNORMAL LOW (ref 22–32)
Calcium: 8.6 mg/dL — ABNORMAL LOW (ref 8.9–10.3)
Chloride: 110 mmol/L (ref 98–111)
Creatinine, Ser: 1.42 mg/dL — ABNORMAL HIGH (ref 0.61–1.24)
GFR, Estimated: 59 mL/min — ABNORMAL LOW (ref >60)
Glucose, Bld: 114 mg/dL — ABNORMAL HIGH (ref 70–99)
Potassium: 3.8 mmol/L (ref 3.5–5.1)
Sodium: 137 mmol/L (ref 135–145)

## 2022-12-06 LAB — GLUCOSE, CAPILLARY
Glucose-Capillary: 112 mg/dL — ABNORMAL HIGH (ref 70–99)
Glucose-Capillary: 147 mg/dL — ABNORMAL HIGH (ref 70–99)
Glucose-Capillary: 165 mg/dL — ABNORMAL HIGH (ref 70–99)
Glucose-Capillary: 157 mg/dL — ABNORMAL HIGH (ref 70–99)

## 2022-12-06 LAB — MAGNESIUM: Magnesium: 1.4 mg/dL — ABNORMAL LOW (ref 1.7–2.4)

## 2022-12-06 MED ORDER — DIPHENOXYLATE-ATROPINE 2.5-0.025 MG PO TABS
1.0000 | ORAL_TABLET | Freq: Two times a day (BID) | ORAL | Status: DC
  Administered 2022-12-06: 1 via ORAL
  Filled 2022-12-06: qty 1

## 2022-12-06 MED ORDER — POTASSIUM CHLORIDE CRYS ER 20 MEQ PO TBCR
20.0000 meq | EXTENDED_RELEASE_TABLET | Freq: Once | ORAL | Status: AC
  Administered 2022-12-06: 20 meq via ORAL
  Filled 2022-12-06: qty 1

## 2022-12-06 MED ORDER — DIPHENOXYLATE-ATROPINE 2.5-0.025 MG PO TABS
1.0000 | ORAL_TABLET | Freq: Four times a day (QID) | ORAL | Status: DC
  Administered 2022-12-06 – 2022-12-10 (×16): 1 via ORAL
  Filled 2022-12-06 (×16): qty 1

## 2022-12-06 MED ORDER — MAGNESIUM SULFATE 4 GM/100ML IV SOLN
4.0000 g | Freq: Once | INTRAVENOUS | Status: AC
  Administered 2022-12-06: 4 g via INTRAVENOUS
  Filled 2022-12-06: qty 100

## 2022-12-06 NOTE — Plan of Care (Signed)

## 2022-12-06 NOTE — Progress Notes (Signed)
   Progress Note     Subjective: Still having high ostomy output.  Nausea is stable and with continued improvement related to improved reflux/use of Protonix at night.   Objective: Vital signs in last 24 hours: Temp:  [97.8 F (36.6 C)-98.7 F (37.1 C)] 97.8 F (36.6 C) (04/25 0422) Pulse Rate:  [64-80] 64 (04/25 0422) Resp:  [17-20] 17 (04/25 0422) BP: (121-160)/(73-90) 133/88 (04/25 0422) SpO2:  [96 %-100 %] 96 % (04/25 0422) Last BM Date : 12/06/22  Intake/Output from previous day: 04/24 0701 - 04/25 0700 In: 2366.1 [P.O.:476; I.V.:1890.1] Out: 4820 [Urine:1420; Stool:3400] Intake/Output this shift: No intake/output data recorded.  PE: General: pleasant, WD, male who is laying in bed in NAD Lungs: Respiratory effort nonlabored on room air Abd: soft, NT, ND, ileostomy with stoma pink and viable and thin liquid stool in bag Skin: warm and dry Psych: A&Ox3 with an appropriate affect.    Lab Results:  Recent Labs    12/04/22 1027 12/05/22 0834  WBC 7.1 5.5  HGB 13.7 11.4*  HCT 39.4 35.2*  PLT 113* 102*    BMET Recent Labs    12/05/22 0834 12/06/22 0250  NA 136 137  K 3.3* 3.8  CL 107 110  CO2 21* 21*  GLUCOSE 146* 114*  BUN 18 17  CREATININE 1.67* 1.42*  CALCIUM 8.7* 8.6*    PT/INR No results for input(s): "LABPROT", "INR" in the last 72 hours. CMP     Component Value Date/Time   NA 137 12/06/2022 0250   K 3.8 12/06/2022 0250   CL 110 12/06/2022 0250   CO2 21 (L) 12/06/2022 0250   GLUCOSE 114 (H) 12/06/2022 0250   BUN 17 12/06/2022 0250   CREATININE 1.42 (H) 12/06/2022 0250   CREATININE 2.15 (H) 12/04/2022 1027   CREATININE 1.19 06/09/2020 1613   CALCIUM 8.6 (L) 12/06/2022 0250   PROT 7.5 12/04/2022 1027   ALBUMIN 4.3 12/04/2022 1027   AST 40 12/04/2022 1027   ALT 36 12/04/2022 1027   ALKPHOS 69 12/04/2022 1027   BILITOT 0.7 12/04/2022 1027   GFRNONAA 59 (L) 12/06/2022 0250   GFRNONAA 36 (L) 12/04/2022 1027   Lipase     Component  Value Date/Time   LIPASE 196 (H) 09/27/2022 0807       Studies/Results: No results found.  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 Cliffton Asters 08/23/2022, currently on chemotherapy, who presents with AKi due to high ileostomy output.   - AKI and electrolytes improved with IVF - ileostomy output >3L over last 24 hours.  Continue to trend - continue fiber and bid imodium. Lomotil added today - I have increased the dose  FEN: carb mod ID: none VTE: okay for chemical ppx from surgical standpoint  I reviewed last 24 h vitals and pain scores, last 48 h intake and output, last 24 h labs and trends, and last 24 h imaging results.   LOS: 1 day   Eric Form, [facility] 12/06/2022, 9:05 AM Please see Amion for pager number during day hours 7:00am-4:30pm

## 2022-12-06 NOTE — Progress Notes (Signed)
PROGRESS NOTE    Robert [name]  ZOX:[id number] DOB: Jul 14, 1971 DOA: 12/04/2022 PCP: Bradd Canary, MD    Brief Narrative:   Robert [name] is a 52 y.o. male with past medical history significant for CAD, HTN, HLD, DM2, rectal adenocarcinoma s/p robotic LAR with diverting loop ileostomy 08/23/2022 by Dr. Cliffton Asters on adjuvant chemotherapy who presented to [facility] ED on 4/23 by direction of outpatient oncology office for nausea, abdominal discomfort and concerns of recurrent high output ileostomy.  His chemotherapy session was postponed and was directed to the ED for further evaluation and management.  Recently admitted 4/19 - 4/21 for acute renal failure due to high ileostomy output.  He was supported with IV fluid hydration and started on Imodium with improvement of his creatinine back to his baseline.  Patient reports has not been utilizing the Imodium as directed due to causing his output to become like a paste; which was causing him increased difficulties caring for his ostomy.  In the ED, temperature 97.5 F, HR 73, RR 16, BP 140/90, SpO2 98% on room air.  WBC 7.1, hemoglobin 13.7, platelets 113.  Sodium 137, potassium 3.4, chloride 103, CO2 24, glucose 192, BUN 21, creatinine 2.15.  AST 40, ALT 36, total bilirubin 0.7.  General surgery was consulted.  Patient was given Imodium, Phenergan by EDP.  TRH consulted for admission for further evaluation management of acute renal failure secondary to high output ileostomy.  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  Creatinine on admission 2.15, recently discharged with creatinine 1.63.  Patient has been using only once daily Imodium due to twice daily causing "paste like output" in his ileostomy. -- General Surgery following, appreciate assistance -- Cr  2.15>1.67>1.42 -- NS with 40 mEq KCL at 100 mL/h -- Imodium 2 mg p.o. twice daily -- Start Lomotil twice daily today -- Continue fiber supplementation -- Strict I's and O's, monitor ostomy output closely -- BMP daily  Hypokalemia Potassium 3.8 this morning, will continue repletion.  Etiology likely secondary to GI loss with high output ostomy. -- BMP in a.m.   Hypomagnesemia Magnesium 1.4, will replete. -- Repeat magnesium level in a.m.  Essential hypertension -- Carvedilol 25 mg p.o. twice daily -- Amlodipine 5 g p.o. daily PRN SBP >150  Hyperlipidemia Currently not on statin outpatient  Type 2 diabetes mellitus Home regimen includes metformin 500 mg p.o. nightly, Januvia 100 mg p.o. daily.  Hemoglobin A1c 7.2 on 09/17/2022, failure well-controlled. -- Hold oral hypoglycemics while inpatient -- SSI for coverage -- CBGs qAC/HS  GERD -- Protonix 40 mg p.o. nightly -- Outpatient follow-up with GI, Dr. Russella Dar  Rectal adenocarcinoma Patient underwent robotic LAR with diverting loop ileostomy on 08/23/2022 by general surgery, Dr. Cliffton Asters.  Follows with medical oncology outpatient, Dr. Mosetta Putt.  Currently on chemotherapy with FOLFOX. -- Continue outpatient follow-up with medical oncology   DVT prophylaxis: SCDs Start: 12/04/22 1752    Code Status: Full Code Family Communication: No family present at bedside this morning  Disposition Plan:  Level of care: Med-Surg Status is: Inpatient Remains inpatient appropriate because: IV fluids, [name] improvement of high output ileostomy with resolution of renal failure before stable for discharge home, anticipate 1-2 days    Consultants:  General surgery Medical oncology, Dr. Mosetta Putt  Procedures:  None  Antimicrobials:  None   Subjective: Patient seen  examined bedside, resting comfortably.  Lying in bed.  Patient concerned about increased ostomy output that started this morning.  Was started on twice daily Imodium and fiber  supplementation yesterday.  Will start Lomotil twice daily today.  Replating potassium and magnesium. Continues with IV fluids.  Creatinine continues to improve.  No other specific questions or concerns at this time.  Denies headache, no dizziness, no chest pain, no palpitations, no shortness of breath, no current abdominal pain, no fever/chills/night sweats, no nausea/vomiting/diarrhea, no focal weakness, no fatigue, no paresthesias.  No acute events overnight per nursing staff.  Objective: Vitals:   12/05/22 1237 12/05/22 1915 12/05/22 2042 12/06/22 0422  BP: 121/73 (!) 160/90 127/84 133/88  Pulse: 65 80 70 64  Resp: 20 18  17   Temp: 98.2 F (36.8 C) 98.7 F (37.1 C)  97.8 F (36.6 C)  TempSrc: Oral Oral  Oral  SpO2: 97% 100%  96%  Weight:      Height:        Intake/Output Summary (Last 24 hours) at 12/06/2022 1137 Last data filed at 12/06/2022 0700 Gross per 24 hour  Intake 2130.07 ml  Output 4550 ml  Net -2419.93 ml   Filed Weights   12/04/22 1439  Weight: 110 kg    Examination:  Physical Exam: GEN: NAD, alert and oriented x 3, obese HEENT: NCAT, PERRL, EOMI, sclera clear, MMM PULM: CTAB w/o wheezes/crackles, normal respiratory effort, on room air CV: RRR w/o M/G/R GI: abd soft, NTND, NABS, no R/G/M, colostomy noted with dark liquid stool in collection bag MSK: no peripheral edema, muscle strength globally intact 5/5 bilateral upper/lower extremities NEURO: CN II-XII intact, no focal deficits, sensation to light touch intact PSYCH: normal mood/affect Integumentary: dry/intact, no rashes or wounds    Data Reviewed: I have personally reviewed following labs and imaging studies  CBC: Recent Labs  Lab 11/30/22 1145 12/01/22 0350 12/04/22 1027 12/05/22 0834  WBC 4.9 5.0 7.1 5.5  NEUTROABS 2.1  --  4.4  --   HGB 14.1 12.0* 13.7 11.4*  HCT 41.8 36.5* 39.4 35.2*  MCV 79.2* 80.4 78.8* 83.0  PLT 198 144* 113* 102*   Basic Metabolic Panel: Recent Labs  Lab  11/30/22 1145 12/01/22 0350 12/02/22 1012 12/04/22 1027 12/05/22 0834 12/06/22 0250  NA 133* 132* 134* 137 136 137  K 3.8 3.1* 3.5 3.4* 3.3* 3.8  CL 100 102 105 103 107 110  CO2 25 22 20* 24 21* 21*  GLUCOSE 159* 122* 150* 192* 146* 114*  BUN 35* 32* 18 21* 18 17  CREATININE 2.11* 1.92* 1.68* 2.15* 1.67* 1.42*  CALCIUM 10.4* 8.8* 8.6* 10.0 8.7* 8.6*  MG 1.8  --  1.6*  --   --  1.4*   GFR: Estimated Creatinine Clearance: 80.6 mL/min (A) (by C-G formula based on SCr of 1.42 mg/dL (H)). Liver Function Tests: Recent Labs  Lab 11/30/22 1145 12/01/22 0350 12/02/22 1012 12/04/22 1027  AST 36 30 27 40  ALT 25 23 20  36  ALKPHOS 85 62 59 69  BILITOT 0.8 0.6 0.2* 0.7  PROT 8.0 6.4* 5.5* 7.5  ALBUMIN 4.6 3.5 2.9* 4.3   No results for input(s): "LIPASE", "AMYLASE" in the last 168 hours. No results for input(s): "AMMONIA" in the last 168 hours. Coagulation Profile: No results for input(s): "INR", "PROTIME" in the last 168 hours. Cardiac Enzymes: No results for input(s): "CKTOTAL", "CKMB", "CKMBINDEX", "TROPONINI" in the last 168 hours. BNP (last 3 results) No results for input(s): "PROBNP" in  the last 8760 hours. HbA1C: No results for input(s): "HGBA1C" in the last 72 hours. CBG: Recent Labs  Lab 12/05/22 1154 12/05/22 1643 12/05/22 2126 12/06/22 0733 12/06/22 1115  GLUCAP 127* 113* 129* 112* 147*   Lipid Profile: No results for input(s): "CHOL", "HDL", "LDLCALC", "TRIG", "CHOLHDL", "LDLDIRECT" in the last 72 hours. Thyroid Function Tests: No results for input(s): "TSH", "T4TOTAL", "FREET4", "T3FREE", "THYROIDAB" in the last 72 hours. Anemia Panel: No results for input(s): "VITAMINB12", "FOLATE", "FERRITIN", "TIBC", "IRON", "RETICCTPCT" in the last 72 hours. Sepsis Labs: No results for input(s): "PROCALCITON", "LATICACIDVEN" in the last 168 hours.  Recent Results (from the past 240 hour(s))  C difficile quick screen w PCR reflex     Status: None   Collection Time:  11/30/22  3:19 PM   Specimen: STOOL  Result Value Ref Range Status   C Diff antigen NEGATIVE NEGATIVE Final   C Diff toxin NEGATIVE NEGATIVE Final   C Diff interpretation No C. difficile detected.  Final    Comment: Performed at [facility], [address], [place], [place] [address]  Gastrointestinal Panel by PCR , Stool     Status: None   Collection Time: 11/30/22  3:22 PM  Result Value Ref Range Status   Campylobacter species NOT DETECTED NOT DETECTED Final   Plesimonas shigelloides NOT DETECTED NOT DETECTED Final   Salmonella species NOT DETECTED NOT DETECTED Final   Yersinia enterocolitica NOT DETECTED NOT DETECTED Final   Vibrio species NOT DETECTED NOT DETECTED Final   Vibrio cholerae NOT DETECTED NOT DETECTED Final   Enteroaggregative E coli (EAEC) NOT DETECTED NOT DETECTED Final   Enteropathogenic E coli (EPEC) NOT DETECTED NOT DETECTED Final   Enterotoxigenic E coli (ETEC) NOT DETECTED NOT DETECTED Final   Shiga like toxin producing E coli (STEC) NOT DETECTED NOT DETECTED Final   Shigella/Enteroinvasive E coli (EIEC) NOT DETECTED NOT DETECTED Final   Cryptosporidium NOT DETECTED NOT DETECTED Final   Cyclospora cayetanensis NOT DETECTED NOT DETECTED Final   Entamoeba histolytica NOT DETECTED NOT DETECTED Final   Giardia lamblia NOT DETECTED NOT DETECTED Final   Adenovirus F40/41 NOT DETECTED NOT DETECTED Final   Astrovirus NOT DETECTED NOT DETECTED Final   Norovirus GI/GII NOT DETECTED NOT DETECTED Final   Rotavirus A NOT DETECTED NOT DETECTED Final   Sapovirus (I, II, IV, and V) NOT DETECTED NOT DETECTED Final    Comment: Performed at [facility], [address], [place], [place] [address]         Radiology Studies: No results found.      Scheduled Meds:  (feeding supplement) PROSource Plus  30 mL Oral BID BM   carvedilol  25 mg Oral BID WC   Chlorhexidine Gluconate Cloth  6 each Topical Daily   diphenoxylate-atropine  1 tablet  Oral QID   feeding supplement  237 mL Oral BID BM   insulin aspart  0-15 Units Subcutaneous TID WC   insulin aspart  0-5 Units Subcutaneous QHS   loperamide  2 mg Oral BID   pantoprazole  40 mg Oral QPM   polycarbophil  625 mg Oral Daily   Continuous Infusions:  0.9 % NaCl with KCl 40 mEq / L 100 mL/hr at 12/06/22 0420     LOS: 1 day    Time spent: 51 minutes spent on chart review, discussion with nursing staff, consultants, updating family and interview/physical exam; more than 50% of that time was spent in counseling and/or coordination of care.  Alvira Philips [place], DO [facility] Available via Epic secure chat 7am-7pm After these hours, please refer to coverage provider listed on [email or web address] 12/06/2022, 11:37 AM

## 2022-12-07 ENCOUNTER — Other Ambulatory Visit (HOSPITAL_COMMUNITY): Payer: Self-pay

## 2022-12-07 DIAGNOSIS — N179 Acute kidney failure, unspecified: Secondary | ICD-10-CM | POA: Diagnosis not present

## 2022-12-07 LAB — BASIC METABOLIC PANEL
Anion gap: 6 (ref 5–15)
BUN: 15 mg/dL (ref 6–20)
CO2: 20 mmol/L — ABNORMAL LOW (ref 22–32)
Calcium: 8.8 mg/dL — ABNORMAL LOW (ref 8.9–10.3)
Chloride: 110 mmol/L (ref 98–111)
Creatinine, Ser: 1.4 mg/dL — ABNORMAL HIGH (ref 0.61–1.24)
GFR, Estimated: >60 mL/min (ref >60)
Glucose, Bld: 109 mg/dL — ABNORMAL HIGH (ref 70–99)
Potassium: 4.6 mmol/L (ref 3.5–5.1)
Sodium: 136 mmol/L (ref 135–145)

## 2022-12-07 LAB — GLUCOSE, CAPILLARY
Glucose-Capillary: 105 mg/dL — ABNORMAL HIGH (ref 70–99)
Glucose-Capillary: 117 mg/dL — ABNORMAL HIGH (ref 70–99)
Glucose-Capillary: 107 mg/dL — ABNORMAL HIGH (ref 70–99)
Glucose-Capillary: 162 mg/dL — ABNORMAL HIGH (ref 70–99)

## 2022-12-07 LAB — MAGNESIUM: Magnesium: 2.1 mg/dL (ref 1.7–2.4)

## 2022-12-07 MED ORDER — LOPERAMIDE HCL 2 MG PO CAPS
4.0000 mg | ORAL_CAPSULE | Freq: Two times a day (BID) | ORAL | Status: DC
  Administered 2022-12-07 – 2022-12-08 (×2): 4 mg via ORAL
  Filled 2022-12-07 (×2): qty 2

## 2022-12-07 MED ORDER — DIPHENOXYLATE-ATROPINE 2.5-0.025 MG PO TABS
1.0000 | ORAL_TABLET | Freq: Four times a day (QID) | ORAL | 0 refills | Status: DC
  Filled 2022-12-07: qty 84, 21d supply, fill #0

## 2022-12-07 NOTE — Progress Notes (Signed)
PROGRESS NOTE    Robert [name]  OVF:[id number] DOB: 06-Jul-1971 DOA: 12/04/2022 PCP: Bradd Canary, MD    Brief Narrative:   Robert [name] is a 52 y.o. male with past medical history significant for CAD, HTN, HLD, DM2, rectal adenocarcinoma s/p robotic LAR with diverting loop ileostomy 08/23/2022 by Dr. Cliffton Asters on adjuvant chemotherapy who presented to [facility] ED on 4/23 by direction of outpatient oncology office for nausea, abdominal discomfort and concerns of recurrent high output ileostomy.  His chemotherapy session was postponed and was directed to the ED for further evaluation and management.  Recently admitted 4/19 - 4/21 for acute renal failure due to high ileostomy output.  He was supported with IV fluid hydration and started on Imodium with improvement of his creatinine back to his baseline.  Patient reports has not been utilizing the Imodium as directed due to causing his output to become like a paste; which was causing him increased difficulties caring for his ostomy.  In the ED, temperature 97.5 F, HR 73, RR 16, BP 140/90, SpO2 98% on room air.  WBC 7.1, hemoglobin 13.7, platelets 113.  Sodium 137, potassium 3.4, chloride 103, CO2 24, glucose 192, BUN 21, creatinine 2.15.  AST 40, ALT 36, total bilirubin 0.7.  General surgery was consulted.  Patient was given Imodium, Phenergan by EDP.  TRH consulted for admission for further evaluation management of acute renal failure secondary to high output ileostomy.  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  Creatinine on admission 2.15, recently discharged with creatinine 1.63.  Patient has been using only once daily Imodium due to twice daily causing "paste like output" in his ileostomy. -- General Surgery following, appreciate assistance -- Cr  2.15>1.67>1.42>1.40 -- Discontinue IV fluids today -- Imodium 4 mg p.o. twice daily -- Lomotil QID -- Continue fiber supplementation -- Strict I's and O's, monitor ostomy output closely -- BMP daily  Hypokalemia: Resolved Etiology likely secondary to GI loss with high output ostomy.  Potassium 4.6 this morning. -- BMP in a.m.   Hypomagnesemia Magnesium 2.1 this morning -- Repeat magnesium level in a.m.  Essential hypertension -- Carvedilol 25 mg p.o. twice daily -- Amlodipine 5 g p.o. daily PRN SBP >150  Hyperlipidemia Currently not on statin outpatient  Type 2 diabetes mellitus Home regimen includes metformin 500 mg p.o. nightly, Januvia 100 mg p.o. daily.  Hemoglobin A1c 7.2 on 09/17/2022, failure well-controlled. -- Hold oral hypoglycemics while inpatient -- SSI for coverage -- CBGs qAC/HS  GERD -- Protonix 40 mg p.o. nightly -- Outpatient follow-up with GI, Dr. Russella Dar  Rectal adenocarcinoma Patient underwent robotic LAR with diverting loop ileostomy on 08/23/2022 by general surgery, Dr. Cliffton Asters.  Follows with medical oncology outpatient, Dr. Mosetta Putt.  Currently on chemotherapy with FOLFOX. -- Continue outpatient follow-up with medical oncology   DVT prophylaxis: SCDs Start: 12/04/22 1752    Code Status: Full Code Family Communication: No family present at bedside this morning  Disposition Plan:  Level of care: Med-Surg Status is: Inpatient Remains inpatient appropriate because: IV fluids, needs improvement of high output ileostomy with resolution of renal failure before stable for discharge home, anticipate 1-2 days    Consultants:  General surgery Medical oncology, Dr. Mosetta Putt  Procedures:  None  Antimicrobials:  None   Subjective: Patient seen examined bedside, resting comfortably.  Lying in bed.  Patient reports that his output was improving yesterday afternoon/evening; but has now picked up once again with watery output.  Patient also not impressed by the  small improvement of his renal function today.  Seen by general surgery today and increasing dosage of Imodium.  Reports his nausea and reflux symptoms are also much improved.  No other specific questions or concerns at this time.  Denies headache, no dizziness, no chest pain, no palpitations, no shortness of breath, no current abdominal pain, no fever/chills/night sweats, no nausea/vomiting/diarrhea, no focal weakness, no fatigue, no paresthesias.  No acute events overnight per nursing staff.  Objective: Vitals:   12/06/22 0422 12/06/22 1414 12/06/22 2142 12/07/22 0520  BP: 133/88 (!) 141/89 131/72 (!) 145/93  Pulse: 64 68 66 60  Resp: 17 20 18 20   Temp: 97.8 F (36.6 C) 97.6 F (36.4 C) 98.8 F (37.1 C) 98.4 F (36.9 C)  TempSrc: Oral Oral Oral Oral  SpO2: 96% 98% 98% 97%  Weight:      Height:        Intake/Output Summary (Last 24 hours) at 12/07/2022 1234 Last data filed at 12/07/2022 0900 Gross per 24 hour  Intake 945 ml  Output 3675 ml  Net -2730 ml   Filed Weights   12/04/22 1439  Weight: 110 kg    Examination:  Physical Exam: GEN: NAD, alert and oriented x 3, obese HEENT: NCAT, PERRL, EOMI, sclera clear, MMM PULM: CTAB w/o wheezes/crackles, normal respiratory effort, on room air CV: RRR w/o M/G/R GI: abd soft, NTND, NABS, no R/G/M, colostomy noted with liquid stool in collection bag MSK: no peripheral edema, muscle strength globally intact 5/5 bilateral upper/lower extremities NEURO: CN II-XII intact, no focal deficits, sensation to light touch intact PSYCH: normal mood/affect Integumentary: dry/intact, no rashes or wounds    Data Reviewed: I have personally reviewed following labs and imaging studies  CBC: Recent Labs  Lab 12/01/22 0350 12/04/22 1027 12/05/22 0834  WBC 5.0 7.1 5.5  NEUTROABS  --  4.4  --   HGB 12.0* 13.7 11.4*  HCT 36.5* 39.4 35.2*  MCV 80.4 78.8* 83.0  PLT 144* 113* 102*   Basic Metabolic Panel: Recent Labs  Lab 12/02/22 1012  12/04/22 1027 12/05/22 0834 12/06/22 0250 12/07/22 0410  NA 134* 137 136 137 136  K 3.5 3.4* 3.3* 3.8 4.6  CL 105 103 107 110 110  CO2 20* 24 21* 21* 20*  GLUCOSE 150* 192* 146* 114* 109*  BUN 18 21* 18 17 15   CREATININE 1.68* 2.15* 1.67* 1.42* 1.40*  CALCIUM 8.6* 10.0 8.7* 8.6* 8.8*  MG 1.6*  --   --  1.4* 2.1   GFR: Estimated Creatinine Clearance: 81.7 mL/min (A) (by C-G formula based on SCr of 1.4 mg/dL (H)). Liver Function Tests: Recent Labs  Lab 12/01/22 0350 12/02/22 1012 12/04/22 1027  AST 30 27 40  ALT 23 20 36  ALKPHOS 62 59 69  BILITOT 0.6 0.2* 0.7  PROT 6.4* 5.5* 7.5  ALBUMIN 3.5 2.9* 4.3   No results for input(s): "LIPASE", "AMYLASE" in the last 168 hours. No results for input(s): "AMMONIA" in the last 168 hours. Coagulation Profile: No results for input(s): "INR", "PROTIME" in the last 168 hours. Cardiac Enzymes: No results for input(s): "CKTOTAL", "CKMB", "CKMBINDEX", "TROPONINI" in the last 168 hours. BNP (last 3 results) No results for input(s): "PROBNP" in the last 8760 hours. HbA1C: No results for input(s): "HGBA1C" in the last 72 hours. CBG: Recent Labs  Lab 12/06/22 1115  12/06/22 1617 12/06/22 2145 12/07/22 0724 12/07/22 1129  GLUCAP 147* 165* 157* 105* 117*   Lipid Profile: No results for input(s): "CHOL", "HDL", "LDLCALC", "TRIG", "CHOLHDL", "LDLDIRECT" in the last 72 hours. Thyroid Function Tests: No results for input(s): "TSH", "T4TOTAL", "FREET4", "T3FREE", "THYROIDAB" in the last 72 hours. Anemia Panel: No results for input(s): "VITAMINB12", "FOLATE", "FERRITIN", "TIBC", "IRON", "RETICCTPCT" in the last 72 hours. Sepsis Labs: No results for input(s): "PROCALCITON", "LATICACIDVEN" in the last 168 hours.  Recent Results (from the past 240 hour(s))  C difficile quick screen w PCR reflex     Status: None   Collection Time: 11/30/22  3:19 PM   Specimen: STOOL  Result Value Ref Range Status   C Diff antigen NEGATIVE NEGATIVE Final    C Diff toxin NEGATIVE NEGATIVE Final   C Diff interpretation No C. difficile detected.  Final    Comment: Performed at [facility], [address] [place], [place] [address]  Gastrointestinal Panel by PCR , Stool     Status: None   Collection Time: 11/30/22  3:22 PM  Result Value Ref Range Status   Campylobacter species NOT DETECTED NOT DETECTED Final   Plesimonas shigelloides NOT DETECTED NOT DETECTED Final   Salmonella species NOT DETECTED NOT DETECTED Final   Yersinia enterocolitica NOT DETECTED NOT DETECTED Final   Vibrio species NOT DETECTED NOT DETECTED Final   Vibrio cholerae NOT DETECTED NOT DETECTED Final   Enteroaggregative E coli (EAEC) NOT DETECTED NOT DETECTED Final   Enteropathogenic E coli (EPEC) NOT DETECTED NOT DETECTED Final   Enterotoxigenic E coli (ETEC) NOT DETECTED NOT DETECTED Final   Shiga like toxin producing E coli (STEC) NOT DETECTED NOT DETECTED Final   Shigella/Enteroinvasive E coli (EIEC) NOT DETECTED NOT DETECTED Final   Cryptosporidium NOT DETECTED NOT DETECTED Final   Cyclospora cayetanensis NOT DETECTED NOT DETECTED Final   Entamoeba histolytica NOT DETECTED NOT DETECTED Final   Giardia lamblia NOT DETECTED NOT DETECTED Final   Adenovirus F40/41 NOT DETECTED NOT DETECTED Final   Astrovirus NOT DETECTED NOT DETECTED Final   Norovirus GI/GII NOT DETECTED NOT DETECTED Final   Rotavirus A NOT DETECTED NOT DETECTED Final   Sapovirus (I, II, IV, and V) NOT DETECTED NOT DETECTED Final    Comment: Performed at [facility], [address], [place], [place] [address]         Radiology Studies: No results found.      Scheduled Meds:  (feeding supplement) PROSource Plus  30 mL Oral BID BM   carvedilol  25 mg Oral BID WC   Chlorhexidine Gluconate Cloth  6 each Topical Daily   diphenoxylate-atropine  1 tablet Oral QID   feeding supplement  237 mL Oral BID BM   insulin aspart  0-15 Units Subcutaneous TID WC   insulin  aspart  0-5 Units Subcutaneous QHS   loperamide  4 mg Oral BID   pantoprazole  40 mg Oral QPM   polycarbophil  625 mg Oral Daily   Continuous Infusions:     LOS: 2 days    Time spent: 51 minutes spent on chart review, discussion with nursing staff, consultants, updating family and interview/physical exam; more than 50% of that time was spent in counseling and/or coordination of care.    Alvira Philips [place], DO [facility] Available via Epic secure chat 7am-7pm After these hours, please refer to coverage provider listed on [email or web address] 12/07/2022, 12:34 PM

## 2022-12-07 NOTE — Progress Notes (Signed)
   Progress Note     Subjective: Ileostomy output slowed down and thickened over the course of yesterday until overnight/early this am. He has had over a liter output since midnight though it remains thicker. Otherwise he is feeling better overall  Objective: Vital signs in last 24 hours: Temp:  [97.6 F (36.4 C)-98.8 F (37.1 C)] 98.4 F (36.9 C) (04/26 0520) Pulse Rate:  [60-68] 60 (04/26 0520) Resp:  [18-20] 20 (04/26 0520) BP: (131-145)/(72-93) 145/93 (04/26 0520) SpO2:  [97 %-98 %] 97 % (04/26 0520) Last BM Date : 12/07/22  Intake/Output from previous day: 04/25 0701 - 04/26 0700 In: 832 [P.O.:832] Out: 4075 [Urine:1375; Stool:2700] Intake/Output this shift: Total I/O In: 350 [P.O.:350] Out: 650 [Urine:175; Stool:475]  PE: General: pleasant, WD, male who is laying in bed in NAD Lungs: Respiratory effort nonlabored on room air Abd: soft, NT, ND, ileostomy with stoma pink and viable and thin to soft liquid stool in bag Skin: warm and dry Psych: A&Ox3 with an appropriate affect.    Lab Results:  Recent Labs    12/04/22 1027 12/05/22 0834  WBC 7.1 5.5  HGB 13.7 11.4*  HCT 39.4 35.2*  PLT 113* 102*    BMET Recent Labs    12/06/22 0250 12/07/22 0410  NA 137 136  K 3.8 4.6  CL 110 110  CO2 21* 20*  GLUCOSE 114* 109*  BUN 17 15  CREATININE 1.42* 1.40*  CALCIUM 8.6* 8.8*    PT/INR No results for input(s): "LABPROT", "INR" in the last 72 hours. CMP     Component Value Date/Time   NA 136 12/07/2022 0410   K 4.6 12/07/2022 0410   CL 110 12/07/2022 0410   CO2 20 (L) 12/07/2022 0410   GLUCOSE 109 (H) 12/07/2022 0410   BUN 15 12/07/2022 0410   CREATININE 1.40 (H) 12/07/2022 0410   CREATININE 2.15 (H) 12/04/2022 1027   CREATININE 1.19 06/09/2020 1613   CALCIUM 8.8 (L) 12/07/2022 0410   PROT 7.5 12/04/2022 1027   ALBUMIN 4.3 12/04/2022 1027   AST 40 12/04/2022 1027   ALT 36 12/04/2022 1027   ALKPHOS 69 12/04/2022 1027   BILITOT 0.7 12/04/2022 1027    GFRNONAA >60 12/07/2022 0410   GFRNONAA 36 (L) 12/04/2022 1027   Lipase     Component Value Date/Time   LIPASE 196 (H) 09/27/2022 0807       Studies/Results: No results found.  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 Cliffton Asters 08/23/2022, currently on chemotherapy, who presents with AKi due to high ileostomy output.   - AKI and electrolytes improved with IVF - ileostomy output >3L over last 24 hours.  Continue to trend - continue fiber and bid imodium. Lomotil added 4/25. Increase imodium  FEN: carb mod ID: none VTE: okay for chemical ppx from surgical standpoint  I reviewed last 24 h vitals and pain scores, last 48 h intake and output, last 24 h labs and trends, and last 24 h imaging results.   LOS: 2 days   Eric Form, [facility] Surgery 12/07/2022, 10:03 AM Please see Amion for pager number during day hours 7:00am-4:30pm

## 2022-12-08 ENCOUNTER — Inpatient Hospital Stay: Payer: BLUE CROSS/BLUE SHIELD

## 2022-12-08 ENCOUNTER — Other Ambulatory Visit (HOSPITAL_COMMUNITY): Payer: Self-pay

## 2022-12-08 DIAGNOSIS — N179 Acute kidney failure, unspecified: Secondary | ICD-10-CM | POA: Diagnosis not present

## 2022-12-08 LAB — BASIC METABOLIC PANEL
Anion gap: 9 (ref 5–15)
BUN: 16 mg/dL (ref 6–20)
CO2: 18 mmol/L — ABNORMAL LOW (ref 22–32)
Calcium: 8.8 mg/dL — ABNORMAL LOW (ref 8.9–10.3)
Chloride: 107 mmol/L (ref 98–111)
Creatinine, Ser: 1.35 mg/dL — ABNORMAL HIGH (ref 0.61–1.24)
GFR, Estimated: >60 mL/min (ref >60)
Glucose, Bld: 126 mg/dL — ABNORMAL HIGH (ref 70–99)
Potassium: 3.9 mmol/L (ref 3.5–5.1)
Sodium: 134 mmol/L — ABNORMAL LOW (ref 135–145)

## 2022-12-08 LAB — GLUCOSE, CAPILLARY
Glucose-Capillary: 108 mg/dL — ABNORMAL HIGH (ref 70–99)
Glucose-Capillary: 129 mg/dL — ABNORMAL HIGH (ref 70–99)
Glucose-Capillary: 121 mg/dL — ABNORMAL HIGH (ref 70–99)
Glucose-Capillary: 129 mg/dL — ABNORMAL HIGH (ref 70–99)

## 2022-12-08 LAB — MAGNESIUM: Magnesium: 1.8 mg/dL (ref 1.7–2.4)

## 2022-12-08 MED ORDER — POLYVINYL ALCOHOL 1.4 % OP SOLN
1.0000 [drp] | OPHTHALMIC | Status: DC | PRN
  Administered 2022-12-08: 1 [drp] via OPHTHALMIC
  Filled 2022-12-08: qty 15

## 2022-12-08 MED ORDER — RISAQUAD PO CAPS
2.0000 | ORAL_CAPSULE | Freq: Every day | ORAL | Status: DC
  Administered 2022-12-08 – 2022-12-10 (×3): 2 via ORAL
  Filled 2022-12-08 (×3): qty 2

## 2022-12-08 MED ORDER — LORATADINE 10 MG PO TABS
10.0000 mg | ORAL_TABLET | Freq: Every day | ORAL | Status: DC | PRN
  Administered 2022-12-08 – 2022-12-09 (×2): 10 mg via ORAL
  Filled 2022-12-08 (×2): qty 1

## 2022-12-08 MED ORDER — LOPERAMIDE HCL 2 MG PO CAPS
4.0000 mg | ORAL_CAPSULE | Freq: Three times a day (TID) | ORAL | Status: DC
  Administered 2022-12-08 (×2): 4 mg via ORAL
  Filled 2022-12-08 (×2): qty 2

## 2022-12-08 NOTE — Progress Notes (Signed)
PROGRESS NOTE    Robert [name]  WUJ:[id number] DOB: [birth year] DOA: 12/04/2022 PCP: Bradd Canary, MD    Brief Narrative:   Robert [name] is a 52 y.o. male with past medical history significant for CAD, HTN, HLD, DM2, rectal adenocarcinoma s/p robotic LAR with diverting loop ileostomy 08/23/2022 by Dr. Cliffton Asters on adjuvant chemotherapy who presented to [facility] ED on 4/23 by direction of outpatient oncology office for nausea, abdominal discomfort and concerns of recurrent high output ileostomy.  His chemotherapy session was postponed and was directed to the ED for further evaluation and management.  Recently admitted 4/19 - 4/21 for acute renal failure due to high ileostomy output.  He was supported with IV fluid hydration and started on Imodium with improvement of his creatinine back to his baseline.  Patient reports has not been utilizing the Imodium as directed due to causing his output to become like a paste; which was causing him increased difficulties caring for his ostomy.  In the ED, temperature 97.5 F, HR 73, RR 16, BP 140/90, SpO2 98% on room air.  WBC 7.1, hemoglobin 13.7, platelets 113.  Sodium 137, potassium 3.4, chloride 103, CO2 24, glucose 192, BUN 21, creatinine 2.15.  AST 40, ALT 36, total bilirubin 0.7.  General surgery was consulted.  Patient was given Imodium, Phenergan by EDP.  TRH consulted for admission for further evaluation management of acute renal failure secondary to high output ileostomy.  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  Creatinine on admission 2.15, recently discharged with creatinine 1.63.  Patient has been using only once daily Imodium due to twice daily causing "paste like output" in his ileostomy. -- General Surgery following, appreciate assistance -- Cr  2.15>1.67>1.42>1.40>1.35 -- Imodium increased to 4 mg p.o. TID today -- Lomotil QID -- Continue fiber supplementation -- Strict I's and O's, monitor ostomy output closely -- BMP daily  Hypokalemia: Resolved Etiology likely secondary to GI loss with high output ostomy.  Potassium 3.9 this morning. -- BMP in a.m.   Hypomagnesemia Magnesium 1.8 this morning -- Repeat magnesium level in a.m.  Essential hypertension -- Carvedilol 25 mg p.o. twice daily -- Amlodipine 5 g p.o. daily PRN SBP >150  Hyperlipidemia Currently not on statin outpatient  Type 2 diabetes mellitus Home regimen includes metformin 500 mg p.o. nightly, Januvia 100 mg p.o. daily.  Hemoglobin A1c 7.2 on 09/17/2022, failure well-controlled. -- Hold oral hypoglycemics while inpatient -- SSI for coverage -- CBGs qAC/HS  GERD -- Protonix 40 mg p.o. nightly -- Outpatient follow-up with GI, Dr. Russella Dar  Rectal adenocarcinoma Patient underwent robotic LAR with diverting loop ileostomy on 08/23/2022 by general surgery, Dr. Cliffton Asters.  Follows with medical oncology outpatient, Dr. Mosetta Putt.  Currently on chemotherapy with FOLFOX. -- Continue outpatient follow-up with medical oncology   DVT prophylaxis: SCDs Start: 12/04/22 1752    Code Status: Full Code Family Communication: No family present at bedside this morning  Disposition Plan:  Level of care: Med-Surg Status is: Inpatient Remains inpatient appropriate because: IV fluids, needs improvement of high output ileostomy with resolution of renal failure before stable for discharge home, anticipate 1-2 days    Consultants:  General surgery Medical oncology, Dr. Mosetta Putt  Procedures:  None  Antimicrobials:  None   Subjective: Patient seen examined bedside, resting comfortably.  Lying in bed.  Was walking around  the unit earlier this morning.  Patient reports continued high output from his ileostomy, 2 L since 10 PM last night.  Seen by general surgery with increasing of  Imodium to 3 times daily dosing today.  Renal function continues to slowly improve, electrolytes stable.    No other specific questions or concerns at this time.  Denies headache, no dizziness, no chest pain, no palpitations, no shortness of breath, no current abdominal pain, no fever/chills/night sweats, no nausea/vomiting/diarrhea, no focal weakness, no fatigue, no paresthesias.  No acute events overnight per nursing staff.  Objective: Vitals:   12/07/22 1404 12/07/22 2114 12/08/22 0420 12/08/22 1202  BP: (!) 143/87 (!) 143/89 (!) 153/88 (!) 142/84  Pulse: (!) 58 65 71 65  Resp: 20 18 18 16   Temp: 98 F (36.7 C) 98.3 F (36.8 C) 98.1 F (36.7 C) 99.4 F (37.4 C)  TempSrc: Oral Oral Oral Oral  SpO2: 97% 96% 95% 94%  Weight:      Height:        Intake/Output Summary (Last 24 hours) at 12/08/2022 1219 Last data filed at 12/08/2022 1000 Gross per 24 hour  Intake 730 ml  Output 4025 ml  Net -3295 ml   Filed Weights   12/04/22 1439  Weight: 110 kg    Examination:  Physical Exam: GEN: NAD, alert and oriented x 3, obese HEENT: NCAT, PERRL, EOMI, sclera clear, MMM PULM: CTAB w/o wheezes/crackles, normal respiratory effort, on room air CV: RRR w/o M/G/R GI: abd soft, NTND, NABS, no R/G/M, colostomy noted with liquid light tan stool in collection bag MSK: no peripheral edema, muscle strength globally intact 5/5 bilateral upper/lower extremities NEURO: CN II-XII intact, no focal deficits, sensation to light touch intact PSYCH: normal mood/affect Integumentary: dry/intact, no rashes or wounds    Data Reviewed: I have personally reviewed following labs and imaging studies  CBC: Recent Labs  Lab 12/04/22 1027 12/05/22 0834  WBC 7.1 5.5  NEUTROABS 4.4  --   HGB 13.7 11.4*  HCT 39.4 35.2*  MCV 78.8* 83.0  PLT 113* 102*   Basic Metabolic Panel: Recent Labs  Lab 12/02/22 1012 12/04/22 1027 12/05/22 0834 12/06/22 0250 12/07/22 0410 12/08/22 0453  NA 134* 137 136 137  136 134*  K 3.5 3.4* 3.3* 3.8 4.6 3.9  CL 105 103 107 110 110 107  CO2 20* 24 21* 21* 20* 18*  GLUCOSE 150* 192* 146* 114* 109* 126*  BUN 18 21* 18 17 15 16   CREATININE 1.68* 2.15* 1.67* 1.42* 1.40* 1.35*  CALCIUM 8.6* 10.0 8.7* 8.6* 8.8* 8.8*  MG 1.6*  --   --  1.4* 2.1 1.8   GFR: Estimated Creatinine Clearance: 84.8 mL/min (A) (by C-G formula based on SCr of 1.35 mg/dL (H)). Liver Function Tests: Recent Labs  Lab 12/02/22 1012 12/04/22 1027  AST 27 40  ALT 20 36  ALKPHOS 59 69  BILITOT 0.2* 0.7  PROT 5.5* 7.5  ALBUMIN 2.9* 4.3   No results for input(s): "LIPASE", "AMYLASE" in the last 168 hours. No results for input(s): "AMMONIA" in the last 168 hours. Coagulation Profile: No results for input(s): "INR", "PROTIME" in the last 168 hours. Cardiac Enzymes: No results for input(s): "CKTOTAL", "CKMB", "CKMBINDEX", "TROPONINI" in the last 168 hours. BNP (last 3 results) No results for input(s): "PROBNP" in the last 8760 hours. HbA1C: No results for input(s): "HGBA1C" in the last 72 hours. CBG: Recent Labs  Lab 12/07/22 1129 12/07/22 1646 12/07/22 2111 12/08/22 0733 12/08/22 1152  GLUCAP  117* 107* 162* 108* 129*   Lipid Profile: No results for input(s): "CHOL", "HDL", "LDLCALC", "TRIG", "CHOLHDL", "LDLDIRECT" in the last 72 hours. Thyroid Function Tests: No results for input(s): "TSH", "T4TOTAL", "FREET4", "T3FREE", "THYROIDAB" in the last 72 hours. Anemia Panel: No results for input(s): "VITAMINB12", "FOLATE", "FERRITIN", "TIBC", "IRON", "RETICCTPCT" in the last 72 hours. Sepsis Labs: No results for input(s): "PROCALCITON", "LATICACIDVEN" in the last 168 hours.  Recent Results (from the past 240 hour(s))  C difficile quick screen w PCR reflex     Status: None   Collection Time: 11/30/22  3:19 PM   Specimen: STOOL  Result Value Ref Range Status   C Diff antigen NEGATIVE NEGATIVE Final   C Diff toxin NEGATIVE NEGATIVE Final   C Diff interpretation No C. difficile  detected.  Final    Comment: Performed at [facility], [address] [place], [place] [address]  Gastrointestinal Panel by PCR , Stool     Status: None   Collection Time: 11/30/22  3:22 PM  Result Value Ref Range Status   Campylobacter species NOT DETECTED NOT DETECTED Final   Plesimonas shigelloides NOT DETECTED NOT DETECTED Final   Salmonella species NOT DETECTED NOT DETECTED Final   Yersinia enterocolitica NOT DETECTED NOT DETECTED Final   Vibrio species NOT DETECTED NOT DETECTED Final   Vibrio cholerae NOT DETECTED NOT DETECTED Final   Enteroaggregative E coli (EAEC) NOT DETECTED NOT DETECTED Final   Enteropathogenic E coli (EPEC) NOT DETECTED NOT DETECTED Final   Enterotoxigenic E coli (ETEC) NOT DETECTED NOT DETECTED Final   Shiga like toxin producing E coli (STEC) NOT DETECTED NOT DETECTED Final   Shigella/Enteroinvasive E coli (EIEC) NOT DETECTED NOT DETECTED Final   Cryptosporidium NOT DETECTED NOT DETECTED Final   Cyclospora cayetanensis NOT DETECTED NOT DETECTED Final   Entamoeba histolytica NOT DETECTED NOT DETECTED Final   Giardia lamblia NOT DETECTED NOT DETECTED Final   Adenovirus F40/41 NOT DETECTED NOT DETECTED Final   Astrovirus NOT DETECTED NOT DETECTED Final   Norovirus GI/GII NOT DETECTED NOT DETECTED Final   Rotavirus A NOT DETECTED NOT DETECTED Final   Sapovirus (I, II, IV, and V) NOT DETECTED NOT DETECTED Final    Comment: Performed at [facility], [address] [place], [place] [address]         Radiology Studies: No results found.      Scheduled Meds:  (feeding supplement) PROSource Plus  30 mL Oral BID BM   acidophilus  2 capsule Oral Daily   carvedilol  25 mg Oral BID WC   Chlorhexidine Gluconate Cloth  6 each Topical Daily   diphenoxylate-atropine  1 tablet Oral QID   feeding supplement  237 mL Oral BID BM   insulin aspart  0-15 Units Subcutaneous TID WC   insulin aspart  0-5 Units Subcutaneous QHS    loperamide  4 mg Oral TID   pantoprazole  40 mg Oral QPM   polycarbophil  625 mg Oral Daily   Continuous Infusions:     LOS: 3 days    Time spent: 51 minutes spent on chart review, discussion with nursing staff, consultants, updating family and interview/physical exam; more than 50% of that time was spent in counseling and/or coordination of care.    Alvira Philips [place], DO [facility] Available via Epic secure chat 7am-7pm After these hours, please refer to coverage provider listed on [email or web address] 12/08/2022, 12:19 PM

## 2022-12-08 NOTE — Progress Notes (Signed)
   Subjective/Chief Complaint: Patient with increased ileostomy output.  Otherwise no complaints   Objective: Vital signs in last 24 hours: Temp:  [98 F (36.7 C)-98.3 F (36.8 C)] 98.1 F (36.7 C) (04/27 0420) Pulse Rate:  [58-71] 71 (04/27 0420) Resp:  [18-20] 18 (04/27 0420) BP: (143-153)/(87-89) 153/88 (04/27 0420) SpO2:  [95 %-97 %] 95 % (04/27 0420) Last BM Date : 12/07/22  Intake/Output from previous day: 04/26 0701 - 04/27 0700 In: 840 [P.O.:830; I.V.:10] Out: 2450 [Urine:825; Stool:1625] Intake/Output this shift: Total I/O In: -  Out: 2250 [Urine:650; Stool:1600]   general: pleasant, WD, male who is laying in bed in NAD Lungs: Respiratory effort nonlabored on room air Abd: soft, NT, ND, ileostomy with stoma pink and viable and thin to soft liquid stool in bag Skin: warm and dry  Lab Results:  No results for input(s): "WBC", "HGB", "HCT", "PLT" in the last 72 hours. BMET Recent Labs    12/07/22 0410 12/08/22 0453  NA 136 134*  K 4.6 3.9  CL 110 107  CO2 20* 18*  GLUCOSE 109* 126*  BUN 15 16  CREATININE 1.40* 1.35*  CALCIUM 8.8* 8.8*   PT/INR No results for input(s): "LABPROT", "INR" in the last 72 hours. ABG No results for input(s): "PHART", "HCO3" in the last 72 hours.  Invalid input(s): "PCO2", "PO2"  Studies/Results: No results found.  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 Cliffton Asters 08/23/2022, currently on chemotherapy, who presents with AKi due to high ileostomy output.    - AKI and electrolytes improved with IVF - ileostomy output >3L over last 24 hours.  Continue to trend - continue fiber and bid imodium. Lomotil added 4/25. Increase imodium to tid    FEN: carb mod ID: none VTE: okay for chemical ppx from surgical standpoint   I reviewed last 24 h vitals and pain scores, last 48 h intake and output, last 24 h labs and  trends, and last 24 h imaging results.   LOS: 3 days    Dortha Schwalbe MD  12/08/2022       I reviewed recent lab values, vitals, and notes.  This care required straight-forward level of medical decision making.

## 2022-12-09 DIAGNOSIS — N179 Acute kidney failure, unspecified: Secondary | ICD-10-CM | POA: Diagnosis not present

## 2022-12-09 LAB — GLUCOSE, CAPILLARY
Glucose-Capillary: 103 mg/dL — ABNORMAL HIGH (ref 70–99)
Glucose-Capillary: 125 mg/dL — ABNORMAL HIGH (ref 70–99)
Glucose-Capillary: 144 mg/dL — ABNORMAL HIGH (ref 70–99)
Glucose-Capillary: 119 mg/dL — ABNORMAL HIGH (ref 70–99)

## 2022-12-09 LAB — BASIC METABOLIC PANEL
Anion gap: 10 (ref 5–15)
BUN: 14 mg/dL (ref 6–20)
CO2: 20 mmol/L — ABNORMAL LOW (ref 22–32)
Calcium: 9.2 mg/dL (ref 8.9–10.3)
Chloride: 104 mmol/L (ref 98–111)
Creatinine, Ser: 1.43 mg/dL — ABNORMAL HIGH (ref 0.61–1.24)
GFR, Estimated: 59 mL/min — ABNORMAL LOW (ref >60)
Glucose, Bld: 112 mg/dL — ABNORMAL HIGH (ref 70–99)
Potassium: 4.3 mmol/L (ref 3.5–5.1)
Sodium: 134 mmol/L — ABNORMAL LOW (ref 135–145)

## 2022-12-09 LAB — MAGNESIUM: Magnesium: 1.6 mg/dL — ABNORMAL LOW (ref 1.7–2.4)

## 2022-12-09 MED ORDER — MAGNESIUM SULFATE 2 GM/50ML IV SOLN
2.0000 g | Freq: Once | INTRAVENOUS | Status: AC
  Administered 2022-12-09: 2 g via INTRAVENOUS
  Filled 2022-12-09: qty 50

## 2022-12-09 MED ORDER — SODIUM CHLORIDE 0.9 % IV BOLUS
500.0000 mL | Freq: Once | INTRAVENOUS | Status: AC
  Administered 2022-12-09: 500 mL via INTRAVENOUS

## 2022-12-09 MED ORDER — LOPERAMIDE HCL 2 MG PO CAPS
4.0000 mg | ORAL_CAPSULE | Freq: Four times a day (QID) | ORAL | Status: DC
  Administered 2022-12-09 – 2022-12-10 (×5): 4 mg via ORAL
  Filled 2022-12-09 (×5): qty 2

## 2022-12-09 NOTE — Progress Notes (Signed)
PROGRESS NOTE    Arda Keadle  ZOX:[id number] DOB: November 08, 1970 DOA: 12/04/2022 PCP: Bradd Canary, MD    Brief Narrative:   Chick Cousins is a 52 y.o. male with past medical history significant for CAD, HTN, HLD, DM2, rectal adenocarcinoma s/p robotic LAR with diverting loop ileostomy 08/23/2022 by Dr. Cliffton Asters on adjuvant chemotherapy who presented to [facility] ED on 4/23 by direction of outpatient oncology office for nausea, abdominal discomfort and concerns of recurrent high output ileostomy.  His chemotherapy session was postponed and was directed to the ED for further evaluation and management.  Recently admitted 4/19 - 4/21 for acute renal failure due to high ileostomy output.  He was supported with IV fluid hydration and started on Imodium with improvement of his creatinine back to his baseline.  Patient reports has not been utilizing the Imodium as directed due to causing his output to become like a paste; which was causing him increased difficulties caring for his ostomy.  In the ED, temperature 97.5 F, HR 73, RR 16, BP 140/90, SpO2 98% on room air.  WBC 7.1, hemoglobin 13.7, platelets 113.  Sodium 137, potassium 3.4, chloride 103, CO2 24, glucose 192, BUN 21, creatinine 2.15.  AST 40, ALT 36, total bilirubin 0.7.  General surgery was consulted.  Patient was given Imodium, Phenergan by EDP.  TRH consulted for admission for further evaluation management of acute renal failure secondary to high output ileostomy.  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  Creatinine on admission 2.15, recently discharged with creatinine 1.63.  Patient has been using only once daily Imodium due to twice daily causing "paste like output" in his ileostomy. -- General Surgery following, appreciate assistance -- Cr  2.15>1.67>1.42>1.40>1.35>1.43 -- Imodium increased to 4 mg p.o. QID today -- Lomotil 2.5-0.025 mg QID -- Continue fiber supplementation -- Strict I's and O's, monitor ostomy output closely -- BMP daily  Hypokalemia: Resolved Etiology likely secondary to GI loss with high output ostomy.  Potassium 4.3 this morning. -- BMP in a.m.   Hypomagnesemia Magnesium 1.6 this morning.  Will replete -- Repeat magnesium level in a.m.  Essential hypertension -- Carvedilol 25 mg p.o. twice daily -- Amlodipine 5 g p.o. daily PRN SBP >150  Hyperlipidemia Currently not on statin outpatient  Type 2 diabetes mellitus Home regimen includes metformin 500 mg p.o. nightly, Januvia 100 mg p.o. daily.  Hemoglobin A1c 7.2 on 09/17/2022, failure well-controlled. -- Hold oral hypoglycemics while inpatient -- SSI for coverage -- CBGs qAC/HS  GERD -- Protonix 40 mg p.o. nightly -- Outpatient follow-up with GI, Dr. Russella Dar  Rectal adenocarcinoma Patient underwent robotic LAR with diverting loop ileostomy on 08/23/2022 by general surgery, Dr. Cliffton Asters.  Follows with medical oncology outpatient, Dr. Mosetta Putt.  Currently on chemotherapy with FOLFOX. -- Continue outpatient follow-up with medical oncology   DVT prophylaxis: SCDs Start: 12/04/22 1752    Code Status: Full Code Family Communication: No family present at bedside this morning  Disposition Plan:  Level of care: Med-Surg Status is: Inpatient Remains inpatient appropriate because: IV fluids, needs improvement of high output ileostomy with resolution of renal failure before stable for discharge home, anticipate discharge possibly tomorrow    Consultants:  General surgery Medical oncology, Dr. Mosetta Putt  Procedures:  None  Antimicrobials:  None   Subjective: Patient seen examined bedside, resting comfortably.  Lying  in bed.  Reports that his ostomy output is "thickening"; with reduced output this morning.  Seen by general surgery and increased Imodium  dosing to 4 times daily today and ordered 500 mL NS bolus.  Magnesium slightly low and will replace today.  No other specific questions or concerns at this time.  Denies headache, no dizziness, no chest pain, no palpitations, no shortness of breath, no current abdominal pain, no fever/chills/night sweats, no nausea/vomiting/diarrhea, no focal weakness, no fatigue, no paresthesias.  No acute events overnight per nursing staff.  Objective: Vitals:   12/08/22 1202 12/08/22 1908 12/09/22 0458 12/09/22 1203  BP: (!) 142/84 (!) 156/82 139/86 136/78  Pulse: 65 66 71 65  Resp: 16 19 19 20   Temp: 99.4 F (37.4 C) 98.5 F (36.9 C) 98.6 F (37 C) 98.5 F (36.9 C)  TempSrc: Oral Oral Oral Oral  SpO2: 94% 95% 94% 94%  Weight:      Height:        Intake/Output Summary (Last 24 hours) at 12/09/2022 1220 Last data filed at 12/09/2022 0900 Gross per 24 hour  Intake 1550 ml  Output 375 ml  Net 1175 ml   Filed Weights   12/04/22 1439  Weight: 110 kg    Examination:  Physical Exam: GEN: NAD, alert and oriented x 3, obese HEENT: NCAT, PERRL, EOMI, sclera clear, MMM PULM: CTAB w/o wheezes/crackles, normal respiratory effort, on room air CV: RRR w/o M/G/R GI: abd soft, NTND, NABS, no R/G/M, colostomy noted with small amount of liquid stool in collection bag MSK: no peripheral edema, muscle strength globally intact 5/5 bilateral upper/lower extremities NEURO: CN II-XII intact, no focal deficits, sensation to light touch intact PSYCH: normal mood/affect Integumentary: dry/intact, no rashes or wounds    Data Reviewed: I have personally reviewed following labs and imaging studies  CBC: Recent Labs  Lab 12/04/22 1027 12/05/22 0834  WBC 7.1 5.5  NEUTROABS 4.4  --   HGB 13.7 11.4*  HCT 39.4 35.2*  MCV 78.8* 83.0  PLT 113* 102*   Basic Metabolic Panel: Recent Labs  Lab 12/05/22 0834 12/06/22 0250 12/07/22 0410 12/08/22 0453 12/09/22 0817  NA 136 137 136 134* 134*  K 3.3* 3.8 4.6  3.9 4.3  CL 107 110 110 107 104  CO2 21* 21* 20* 18* 20*  GLUCOSE 146* 114* 109* 126* 112*  BUN 18 17 15 16 14   CREATININE 1.67* 1.42* 1.40* 1.35* 1.43*  CALCIUM 8.7* 8.6* 8.8* 8.8* 9.2  MG  --  1.4* 2.1 1.8 1.6*   GFR: Estimated Creatinine Clearance: 80 mL/min (A) (by C-G formula based on SCr of 1.43 mg/dL (H)). Liver Function Tests: Recent Labs  Lab 12/04/22 1027  AST 40  ALT 36  ALKPHOS 69  BILITOT 0.7  PROT 7.5  ALBUMIN 4.3   No results for input(s): "LIPASE", "AMYLASE" in the last 168 hours. No results for input(s): "AMMONIA" in the last 168 hours. Coagulation Profile: No results for input(s): "INR", "PROTIME" in the last 168 hours. Cardiac Enzymes: No results for input(s): "CKTOTAL", "CKMB", "CKMBINDEX", "TROPONINI" in the last 168 hours. BNP (last 3 results) No results for input(s): "PROBNP" in the last 8760 hours. HbA1C: No results for input(s): "HGBA1C" in the last 72 hours. CBG: Recent Labs  Lab 12/08/22 1152 12/08/22 1648 12/08/22 2032 12/09/22 0740 12/09/22 1200  GLUCAP 129* 121* 129* 103* 125*   Lipid Profile: No results for input(s): "CHOL", "HDL", "LDLCALC", "TRIG", "CHOLHDL", "LDLDIRECT" in the last 72 hours. Thyroid Function Tests:  No results for input(s): "TSH", "T4TOTAL", "FREET4", "T3FREE", "THYROIDAB" in the last 72 hours. Anemia Panel: No results for input(s): "VITAMINB12", "FOLATE", "FERRITIN", "TIBC", "IRON", "RETICCTPCT" in the last 72 hours. Sepsis Labs: No results for input(s): "PROCALCITON", "LATICACIDVEN" in the last 168 hours.  Recent Results (from the past 240 hour(s))  C difficile quick screen w PCR reflex     Status: None   Collection Time: 11/30/22  3:19 PM   Specimen: STOOL  Result Value Ref Range Status   C Diff antigen NEGATIVE NEGATIVE Final   C Diff toxin NEGATIVE NEGATIVE Final   C Diff interpretation No C. difficile detected.  Final    Comment: Performed at [facility], [address]  [place], [place] [address]  Gastrointestinal Panel by PCR , Stool     Status: None   Collection Time: 11/30/22  3:22 PM  Result Value Ref Range Status   Campylobacter species NOT DETECTED NOT DETECTED Final   Plesimonas shigelloides NOT DETECTED NOT DETECTED Final   Salmonella species NOT DETECTED NOT DETECTED Final   Yersinia enterocolitica NOT DETECTED NOT DETECTED Final   Vibrio species NOT DETECTED NOT DETECTED Final   Vibrio cholerae NOT DETECTED NOT DETECTED Final   Enteroaggregative E coli (EAEC) NOT DETECTED NOT DETECTED Final   Enteropathogenic E coli (EPEC) NOT DETECTED NOT DETECTED Final   Enterotoxigenic E coli (ETEC) NOT DETECTED NOT DETECTED Final   Shiga like toxin producing E coli (STEC) NOT DETECTED NOT DETECTED Final   Shigella/Enteroinvasive E coli (EIEC) NOT DETECTED NOT DETECTED Final   Cryptosporidium NOT DETECTED NOT DETECTED Final   Cyclospora cayetanensis NOT DETECTED NOT DETECTED Final   Entamoeba histolytica NOT DETECTED NOT DETECTED Final   Giardia lamblia NOT DETECTED NOT DETECTED Final   Adenovirus F40/41 NOT DETECTED NOT DETECTED Final   Astrovirus NOT DETECTED NOT DETECTED Final   Norovirus GI/GII NOT DETECTED NOT DETECTED Final   Rotavirus A NOT DETECTED NOT DETECTED Final   Sapovirus (I, II, IV, and V) NOT DETECTED NOT DETECTED Final    Comment: Performed at [facility], [address] [place], [place] [address]         Radiology Studies: No results found.      Scheduled Meds:  (feeding supplement) PROSource Plus  30 mL Oral BID BM   acidophilus  2 capsule Oral Daily   carvedilol  25 mg Oral BID WC   Chlorhexidine Gluconate Cloth  6 each Topical Daily   diphenoxylate-atropine  1 tablet Oral QID   feeding supplement  237 mL Oral BID BM   insulin aspart  0-15 Units Subcutaneous TID WC   insulin aspart  0-5 Units Subcutaneous QHS   loperamide  4 mg Oral QID   pantoprazole  40 mg Oral QPM   polycarbophil  625 mg Oral Daily    Continuous Infusions:  magnesium sulfate bolus IVPB        LOS: 4 days    Time spent: 51 minutes spent on chart review, discussion with nursing staff, consultants, updating family and interview/physical exam; more than 50% of that time was spent in counseling and/or coordination of care.    Alvira Philips [place], DO [facility] Available via Epic secure chat 7am-7pm After these hours, please refer to coverage provider listed on [email or web address] 12/09/2022, 12:20 PM

## 2022-12-09 NOTE — Progress Notes (Signed)
   Subjective/Chief Complaint: Patient states that ostomy output is less today.  He is 8 L deficit.  He has no orthostatic hypotension or dizziness.  He is taking over a liter of intake.  He feels that his ostomy output is thickening.   Objective: Vital signs in last 24 hours: Temp:  [98.5 F (36.9 C)-99.4 F (37.4 C)] 98.6 F (37 C) (04/28 0458) Pulse Rate:  [65-71] 71 (04/28 0458) Resp:  [16-19] 19 (04/28 0458) BP: (139-156)/(82-86) 139/86 (04/28 0458) SpO2:  [94 %-95 %] 94 % (04/28 0458) Last BM Date : 12/08/22  Intake/Output from previous day: 04/27 0701 - 04/28 0700 In: 1310 [P.O.:1310] Out: 2675 [Urine:650; Stool:2025] Intake/Output this shift: No intake/output data recorded.   General: pleasant, WD, male who is laying in bed in NAD Lungs: Respiratory effort nonlabored on room air Abd: soft, NT, ND, ileostomy with stoma pink and viable and thin to soft liquid stool in bag Skin: warm and dry Lab Results:  No results for input(s): "WBC", "HGB", "HCT", "PLT" in the last 72 hours. BMET Recent Labs    12/07/22 0410 12/08/22 0453  NA 136 134*  K 4.6 3.9  CL 110 107  CO2 20* 18*  GLUCOSE 109* 126*  BUN 15 16  CREATININE 1.40* 1.35*  CALCIUM 8.8* 8.8*   PT/INR No results for input(s): "LABPROT", "INR" in the last 72 hours. ABG No results for input(s): "PHART", "HCO3" in the last 72 hours.  Invalid input(s): "PCO2", "PO2"  Studies/Results: No results found.  Anti-infectives: Anti-infectives (From admission, onward)    None       Assessment/Plan:    Rectal adenocarcinoma s/p LAR and diverting ileostomy by Dr. Cliffton Asters 08/23/2022, currently on chemotherapy, who presents with AKi due to high ileostomy output.    - AKI and electrolytes improved with IVF - ileostomy output >2L over last 24 hours.  Continue to trend -seems to be slowing and thickening somewhat. - continue fiber and bid imodium. Lomotil added 4/25. Increase imodium to 4 times daily  -Bolus  500 cc normal saline FEN: carb mod ID: none VTE: okay for chemical ppx from surgical standpoint If the output gets to 2 L or less, he can be discharged from a surgical standpoint.  He will need close follow-up from his medical team to manage his output. I reviewed last 24 h vitals and pain scores, last 48 h intake and output, last 24 h labs and trends, and last 24 h imaging results.  Clovis Pu Clarine Elrod MD 12/09/2022 Moderate complexity

## 2022-12-10 ENCOUNTER — Encounter: Payer: Self-pay | Admitting: Hematology

## 2022-12-10 ENCOUNTER — Other Ambulatory Visit (HOSPITAL_COMMUNITY): Payer: Self-pay

## 2022-12-10 DIAGNOSIS — N179 Acute kidney failure, unspecified: Secondary | ICD-10-CM | POA: Diagnosis not present

## 2022-12-10 LAB — BASIC METABOLIC PANEL
Anion gap: 8 (ref 5–15)
BUN: 14 mg/dL (ref 6–20)
CO2: 21 mmol/L — ABNORMAL LOW (ref 22–32)
Calcium: 8.9 mg/dL (ref 8.9–10.3)
Chloride: 104 mmol/L (ref 98–111)
Creatinine, Ser: 1.34 mg/dL — ABNORMAL HIGH (ref 0.61–1.24)
GFR, Estimated: >60 mL/min (ref >60)
Glucose, Bld: 117 mg/dL — ABNORMAL HIGH (ref 70–99)
Potassium: 4.1 mmol/L (ref 3.5–5.1)
Sodium: 133 mmol/L — ABNORMAL LOW (ref 135–145)

## 2022-12-10 LAB — MAGNESIUM: Magnesium: 1.9 mg/dL (ref 1.7–2.4)

## 2022-12-10 LAB — GLUCOSE, CAPILLARY: Glucose-Capillary: 97 mg/dL (ref 70–99)

## 2022-12-10 MED ORDER — LOPERAMIDE HCL 2 MG PO CAPS
4.0000 mg | ORAL_CAPSULE | Freq: Four times a day (QID) | ORAL | 0 refills | Status: DC
  Filled 2022-12-10: qty 168, 21d supply, fill #0

## 2022-12-10 MED ORDER — CALCIUM POLYCARBOPHIL 625 MG PO TABS: 625.0000 mg | ORAL_TABLET | Freq: Every day | ORAL | 2 refills | Status: AC

## 2022-12-10 MED ORDER — CALCIUM POLYCARBOPHIL 625 MG PO TABS
625.0000 mg | ORAL_TABLET | Freq: Every day | ORAL | 2 refills | Status: DC
  Filled 2022-12-10: qty 30, 30d supply, fill #0

## 2022-12-10 MED ORDER — HEPARIN SOD (PORK) LOCK FLUSH 100 UNIT/ML IV SOLN
500.0000 [IU] | INTRAVENOUS | Status: AC | PRN
  Administered 2022-12-10: 500 [IU]
  Filled 2022-12-10: qty 5

## 2022-12-10 MED ORDER — LOPERAMIDE HCL 2 MG PO CAPS: 4.0000 mg | ORAL_CAPSULE | Freq: Four times a day (QID) | ORAL | 0 refills | Status: AC

## 2022-12-10 MED ORDER — DIPHENOXYLATE-ATROPINE 2.5-0.025 MG PO TABS: 1.0000 | ORAL_TABLET | Freq: Four times a day (QID) | ORAL | 0 refills | Status: AC

## 2022-12-10 NOTE — Discharge Summary (Signed)
Physician Discharge Summary  Robert Viviano WUJ:[id number] DOB: 16-Nov-1970 DOA: 12/04/2022  PCP: Bradd Canary, MD  Admit date: 12/04/2022 Discharge date: 12/10/2022  Admitted From: Home Disposition: Home  Recommendations for Outpatient Follow-up:  Follow up with PCP in 1-2 weeks Follow-up with medical oncology, Dr. Mosetta Putt Follow up with general surgery, Dr. Cliffton Asters Follow-up with gastroenterology, Dr. Russella Dar Please obtain BMP in one week  Home Health: No Equipment/Devices: None  Discharge Condition: Stable CODE STATUS: Full code Diet recommendation: Heart healthy/consistent carbohydrate diet  History of present illness:  Robert [name] is a 52 y.o. male with past medical history significant for CAD, HTN, HLD, DM2, rectal adenocarcinoma s/p robotic LAR with diverting loop ileostomy 08/23/2022 by Dr. Cliffton Asters on adjuvant chemotherapy who presented to [facility] ED on 4/23 by direction of outpatient oncology office for nausea, abdominal discomfort and concerns of recurrent high output ileostomy.  His chemotherapy session was postponed and was directed to the ED for further evaluation and management.   Recently admitted 4/19 - 4/21 for acute renal failure due to high ileostomy output.  He was supported with IV fluid hydration and started on Imodium with improvement of his creatinine back to his baseline.  Patient reports has not been utilizing the Imodium as directed due to causing his output to become like a paste; which was causing him increased difficulties caring for his ostomy.   In the ED, temperature 97.5 F, HR 73, RR 16, BP 140/90, SpO2 98% on room air.  WBC 7.1, hemoglobin 13.7, platelets 113.  Sodium 137, potassium 3.4, chloride 103, CO2 24, glucose 192, BUN 21, creatinine 2.15.  AST 40, ALT 36, total bilirubin 0.7.  General surgery was consulted.  Patient was given Imodium, Phenergan by EDP.  TRH consulted for admission for further evaluation management of acute renal failure  secondary to high output ileostomy.  Hospital course:  Acute renal failure secondary to high output ileostomy Patient presenting to ED by direction of the oncology office for concern of recurrent high output ileostomy causing recurrent renal failure.  Creatinine on admission 2.15, recently discharged with creatinine 1.63.  Patient has been using only once daily Imodium due to twice daily causing "paste like output" in his ileostomy.  General surgery was consulted and followed during hospital course.  Patient was started on Imodium increased to 4 mg p.o. 4 times daily and Lomotil 2.5-0.025 mg QID; as well as a daily fiber supplement.  Patient's creatinine improved to 1.34 at time of discharge with decreased ileostomy output.  Goal output less than 2 L/day.  Encouraged patient to maintain hydration.  Patient plans to follow-up with the oncology infusion center 1-2 times per week for IV fluids per medical oncology.  Recommend repeat BMP 1 week.  Patient encouraged to maintain log of daily ileostomy output.   Hypokalemia: Resolved Repleted during hospitalization.  Etiology likely secondary to GI loss from high output ileostomy.   Hypomagnesemia Repleted during hospitalization.   Essential hypertension Continue carvedilol 25 mg p.o. twice daily, Amlodipine 5 g p.o. daily PRN SBP >150   Hyperlipidemia Currently not on statin outpatient   Type 2 diabetes mellitus Home regimen includes metformin 500 mg p.o. nightly, Januvia 100 mg p.o. daily.  Hemoglobin A1c 7.2 on 09/17/2022, failure well-controlled.   GERD Protonix 40 mg p.o. nightly. Outpatient follow-up with GI, Dr. Russella Dar   Rectal adenocarcinoma Patient underwent robotic LAR with diverting loop ileostomy on 08/23/2022 by general surgery, Dr. Cliffton Asters.  Follows with medical oncology outpatient, Dr. Mosetta Putt.  Currently on chemotherapy with FOLFOX. Continue outpatient follow-up with medical oncology    Discharge Diagnoses:  Principal Problem:   AKI  (acute kidney injury) (HCC) Active Problems:   Rectal cancer (HCC)   HTN (hypertension)   Hyperlipidemia LDL goal <70   Type 2 diabetes mellitus with obesity (HCC)   CAD (coronary artery disease)   Ileostomy in place [facility])   Dehydration   Acute renal failure (ARF) (HCC)    Discharge Instructions  Discharge Instructions     Call MD for:  difficulty breathing, headache or visual disturbances   Complete by: As directed    Call MD for:  extreme fatigue   Complete by: As directed    Call MD for:  persistant dizziness or light-headedness   Complete by: As directed    Call MD for:  persistant nausea and vomiting   Complete by: As directed    Call MD for:  severe uncontrolled pain   Complete by: As directed    Call MD for:  temperature >100.4   Complete by: As directed    Diet - low sodium heart healthy   Complete by: As directed    Increase activity slowly   Complete by: As directed       Allergies as of 12/10/2022       Reactions   Advil [ibuprofen] Shortness Of Breath   Aleve [naproxen] Shortness Of Breath   Mucinex [guaifenesin Er] Shortness Of Breath, Itching, Swelling, Dermatitis, Rash, Other (See Comments)   Swelling of hands and feet   Nsaids Shortness Of Breath, Itching, Swelling, Rash, Other (See Comments)   Aleve and Advil    Latex Rash, Other (See Comments)   "Cartilage will harden"        Medication List     TAKE these medications    amLODipine 5 MG tablet Commonly known as: NORVASC Take 5 mg by mouth daily as needed (for a systolic number reading greater than 150 or as otherwise directed for elevated B/P).   carvedilol 25 MG tablet Commonly known as: COREG Take 1 tablet (25 mg total) by mouth 2 (two) times daily.   Cool Blood Glucose Test Strips test strip Generic drug: glucose blood Check blood sugar once daily   diphenoxylate-atropine 2.5-0.025 MG tablet Commonly known as: LOMOTIL Take 1 tablet by mouth 4 (four) times daily for 21 days.    lidocaine-prilocaine cream Commonly known as: EMLA Apply 1 Application topically as needed (for port access).   loperamide 2 MG capsule Commonly known as: IMODIUM Take 2 capsules (4 mg total) by mouth 4 (four) times daily for 21 days. What changed:  how much to take when to take this Another medication with the same name was removed. Continue taking this medication, and follow the directions you see here.   metFORMIN 500 MG tablet Commonly known as: GLUCOPHAGE Take 1 tablet (500 mg total) by mouth daily with breakfast. What changed: when to take this   ondansetron 8 MG tablet Commonly known as: ZOFRAN Take 8 mg by mouth every 6 (six) hours as needed for nausea or vomiting. What changed: Another medication with the same name was removed. Continue taking this medication, and follow the directions you see here.   pantoprazole 40 MG tablet Commonly known as: PROTONIX Take 1 tablet (40 mg total) by mouth daily. What changed: when to take this   polycarbophil 625 MG tablet Commonly known as: FIBERCON Take 1 tablet (625 mg total) by mouth daily. Start taking on: December 11, 2022   prochlorperazine 10 MG tablet Commonly known as: COMPAZINE Take 10 mg by mouth every 6 (six) hours as needed for nausea or vomiting.   sitaGLIPtin 100 MG tablet Commonly known as: Januvia Take 1 tablet (100 mg total) by mouth daily.   tiZANidine 2 MG tablet Commonly known as: ZANAFLEX Take 0.5-2 tablets (1-4 mg total) by mouth 2 (two) times daily as needed for muscle spasms. What changed:  how much to take when to take this        Follow-up Information     Bradd Canary, MD. Schedule an appointment as soon as possible for a visit in 1 week(s).   Specialty: Family Medicine Contact information: [address] [place] [address] [phone]         Andria Meuse, MD. Schedule an appointment as soon as possible for a visit.   Specialties: General Surgery,  Colon and Rectal Surgery Contact information: [address] [place] [address] [phone]         Malachy Mood, MD. Schedule an appointment as soon as possible for a visit.   Specialties: Hematology, Oncology Contact information: [address] [place] [address] [phone]                Allergies  Allergen Reactions   Advil [Ibuprofen] Shortness Of Breath   Aleve [Naproxen] Shortness Of Breath   Mucinex [Guaifenesin Er] Shortness Of Breath, Itching, Swelling, Dermatitis, Rash and Other (See Comments)    Swelling of hands and feet   Nsaids Shortness Of Breath, Itching, Swelling, Rash and Other (See Comments)    Aleve and Advil    Latex Rash and Other (See Comments)    "Cartilage will harden"    Consultations: General surgery Medical oncology   Procedures/Studies: DG BE (COLON)W SINGLE CM (SOL OR THIN BA)  Result Date: 11/19/2022 CLINICAL DATA:  Rectal cancer status post surgical resection with colorectal anastomosis with diverting ileostomy, presenting for evaluation of the colorectal anastomosis prior to ileostomy takedown. EXAM: SINGLE CONTRAST BARIUM ENEMA TECHNIQUE: Initial scout AP supine abdominal image obtained to insure adequate colon cleansing. Barium was introduced into the colon in a retrograde fashion and refluxed from the rectum to the cecum. Spot images of the colon followed by overhead radiographs were obtained. FLUOROSCOPY: Radiation Exposure Index (as provided by the fluoroscopic device): 72.5 mGy Kerma COMPARISON:  09/27/2022 CT abdomen/pelvis. FINDINGS: Scout radiograph demonstrates anastomotic suture line overlying the coccyx. No dilated small bowel loops. Mild colonic gas. No evidence of pneumatosis or pneumoperitoneum. Installation of water-soluble contrast per rectum via non latex Foley catheter demonstrates intact end-to-side colorectal anastomosis with no evidence of anastomotic leak or stricture. Contrast  proceeds retrograde without difficulty to the level of the mid transverse colon. No abnormal filling defects or diverticulosis in the visualized large bowel. IMPRESSION: Intact colorectal anastomosis with no evidence of anastomotic leak or stricture. Electronically Signed   By: Delbert Phenix M.D.   On: 11/19/2022 11:22     Subjective: Patient seen examined at bedside, resting comfortably.  Lying in bed.  RN present.  Much decreased stool output over the last 24 hours, reported 1300 mL during this timeframe.  Now with some more formed stool in his ileostomy.  Discussed with general surgery, Dr. Gerrit Friends this morning; stable for discharge home from their standpoint with outpatient follow-up with his primary surgeon, Dr. Cliffton Asters to be made.  Patient has outpatient follow-up with oncology with plan for 1-2  times per week IV fluid hydration in the infusion center.  Patient with no other questions or concerns at this time.  Denies headache, no dizziness, no chest pain, no palpitations, no shortness of breath, no abdominal pain, no nausea or vomiting, no fever/chills/night sweats, no focal weakness, no fatigue, no paresthesias.  No acute events overnight per nursing staff.  Discharge Exam: Vitals:   12/10/22 0444 12/10/22 0912  BP: (!) 163/92 (!) 140/87  Pulse: 72 75  Resp: 16   Temp: 98.5 F (36.9 C)   SpO2: 94%    Vitals:   12/09/22 1203 12/09/22 1907 12/10/22 0444 12/10/22 0912  BP: 136/78 (!) 173/97 (!) 163/92 (!) 140/87  Pulse: 65 68 72 75  Resp: 20 18 16    Temp: 98.5 F (36.9 C) 98.3 F (36.8 C) 98.5 F (36.9 C)   TempSrc: Oral Oral Oral   SpO2: 94% 99% 94%   Weight:      Height:        Physical Exam: GEN: NAD, alert and oriented x 3, obese HEENT: NCAT, PERRL, EOMI, sclera clear, MMM PULM: CTAB w/o wheezes/crackles, normal respiratory effort, on room air CV: RRR w/o M/G/R GI: abd soft, NTND, NABS, no R/G/M, ileostomy noted with some liquid and formed stool noted in collection bag MSK:  no peripheral edema, muscle strength globally intact 5/5 bilateral upper/lower extremities NEURO: CN II-XII intact, no focal deficits, sensation to light touch intact PSYCH: normal mood/affect Integumentary: dry/intact, no rashes or wounds    The results of significant diagnostics from this hospitalization (including imaging, microbiology, ancillary and laboratory) are listed below for reference.     Microbiology: Recent Results (from the past 240 hour(s))  C difficile quick screen w PCR reflex     Status: None   Collection Time: 11/30/22  3:19 PM   Specimen: STOOL  Result Value Ref Range Status   C Diff antigen NEGATIVE NEGATIVE Final   C Diff toxin NEGATIVE NEGATIVE Final   C Diff interpretation No C. difficile detected.  Final    Comment: Performed at [facility], [address] [place], [place] [address]  Gastrointestinal Panel by PCR , Stool     Status: None   Collection Time: 11/30/22  3:22 PM  Result Value Ref Range Status   Campylobacter species NOT DETECTED NOT DETECTED Final   Plesimonas shigelloides NOT DETECTED NOT DETECTED Final   Salmonella species NOT DETECTED NOT DETECTED Final   Yersinia enterocolitica NOT DETECTED NOT DETECTED Final   Vibrio species NOT DETECTED NOT DETECTED Final   Vibrio cholerae NOT DETECTED NOT DETECTED Final   Enteroaggregative E coli (EAEC) NOT DETECTED NOT DETECTED Final   Enteropathogenic E coli (EPEC) NOT DETECTED NOT DETECTED Final   Enterotoxigenic E coli (ETEC) NOT DETECTED NOT DETECTED Final   Shiga like toxin producing E coli (STEC) NOT DETECTED NOT DETECTED Final   Shigella/Enteroinvasive E coli (EIEC) NOT DETECTED NOT DETECTED Final   Cryptosporidium NOT DETECTED NOT DETECTED Final   Cyclospora cayetanensis NOT DETECTED NOT DETECTED Final   Entamoeba histolytica NOT DETECTED NOT DETECTED Final   Giardia lamblia NOT DETECTED NOT DETECTED Final   Adenovirus F40/41 NOT DETECTED NOT DETECTED Final   Astrovirus  NOT DETECTED NOT DETECTED Final   Norovirus GI/GII NOT DETECTED NOT DETECTED Final   Rotavirus A NOT DETECTED NOT DETECTED Final   Sapovirus (I, II, IV, and V) NOT DETECTED NOT DETECTED Final    Comment: Performed at [facility], [address] [place], [place] [address]     Labs: BNP (last 3 results) No results for input(s): "BNP" in the last 8760 hours. Basic Metabolic Panel: Recent Labs  Lab 12/06/22 0250 12/07/22 0410 12/08/22 0453 12/09/22 0817 12/10/22 0200  NA 137 136 134* 134* 133*  K 3.8 4.6 3.9 4.3 4.1  CL 110 110 107 104 104  CO2 21* 20* 18* 20* 21*  GLUCOSE 114* 109* 126* 112* 117*  BUN 17 15 16 14 14   CREATININE 1.42* 1.40* 1.35* 1.43* 1.34*  CALCIUM 8.6* 8.8* 8.8* 9.2 8.9  MG 1.4* 2.1 1.8 1.6* 1.9   Liver Function Tests: Recent Labs  Lab 12/04/22 1027  AST 40  ALT 36  ALKPHOS 69  BILITOT 0.7  PROT 7.5  ALBUMIN 4.3   No results for input(s): "LIPASE", "AMYLASE" in the last 168 hours. No results for input(s): "AMMONIA" in the last 168 hours. CBC: Recent Labs  Lab 12/04/22 1027 12/05/22 0834  WBC 7.1 5.5  NEUTROABS 4.4  --   HGB 13.7 11.4*  HCT 39.4 35.2*  MCV 78.8* 83.0  PLT 113* 102*   Cardiac Enzymes: No results for input(s): "CKTOTAL", "CKMB", "CKMBINDEX", "TROPONINI" in the last 168 hours. BNP: Invalid input(s): "POCBNP" CBG: Recent Labs  Lab 12/09/22 0740 12/09/22 1200 12/09/22 1653 12/09/22 2036 12/10/22 0736  GLUCAP 103* 125* 144* 119* 97   D-Dimer No results for input(s): "DDIMER" in the last 72 hours. Hgb A1c No results for input(s): "HGBA1C" in the last 72 hours. Lipid Profile No results for input(s): "CHOL", "HDL", "LDLCALC", "TRIG", "CHOLHDL", "LDLDIRECT" in the last 72 hours. Thyroid function studies No results for input(s): "TSH", "T4TOTAL", "T3FREE", "THYROIDAB" in the last 72 hours.  Invalid input(s): "FREET3" Anemia work up No results for input(s): "VITAMINB12", "FOLATE", "FERRITIN", "TIBC", "IRON",  "RETICCTPCT" in the last 72 hours. Urinalysis    Component Value Date/Time   COLORURINE YELLOW 09/27/2022 0942   APPEARANCEUR HAZY (A) 09/27/2022 0942   LABSPEC 1.021 09/27/2022 0942   PHURINE 5.0 09/27/2022 0942   GLUCOSEU NEGATIVE 09/27/2022 0942   HGBUR NEGATIVE 09/27/2022 0942   BILIRUBINUR NEGATIVE 09/27/2022 0942   KETONESUR NEGATIVE 09/27/2022 0942   PROTEINUR NEGATIVE 09/27/2022 0942   UROBILINOGEN 0.2 05/21/2012 1027   NITRITE NEGATIVE 09/27/2022 0942   LEUKOCYTESUR NEGATIVE 09/27/2022 0942   Sepsis Labs Recent Labs  Lab 12/04/22 1027 12/05/22 0834  WBC 7.1 5.5   Microbiology Recent Results (from the past 240 hour(s))  C difficile quick screen w PCR reflex     Status: None   Collection Time: 11/30/22  3:19 PM   Specimen: STOOL  Result Value Ref Range Status   C Diff antigen NEGATIVE NEGATIVE Final   C Diff toxin NEGATIVE NEGATIVE Final   C Diff interpretation No C. difficile detected.  Final    Comment: Performed at [facility], [address] [place], [place] [address]  Gastrointestinal Panel by PCR , Stool     Status: None   Collection Time: 11/30/22  3:22 PM  Result Value Ref Range Status   Campylobacter species NOT DETECTED NOT DETECTED Final   Plesimonas shigelloides NOT DETECTED NOT DETECTED Final   Salmonella species NOT DETECTED NOT DETECTED Final   Yersinia enterocolitica NOT DETECTED NOT DETECTED Final   Vibrio species NOT DETECTED NOT DETECTED Final   Vibrio cholerae NOT DETECTED NOT DETECTED Final   Enteroaggregative E coli (EAEC) NOT DETECTED NOT DETECTED Final   Enteropathogenic E coli (EPEC) NOT DETECTED NOT DETECTED Final  Enterotoxigenic E coli (ETEC) NOT DETECTED NOT DETECTED Final   Shiga like toxin producing E coli (STEC) NOT DETECTED NOT DETECTED Final   Shigella/Enteroinvasive E coli (EIEC) NOT DETECTED NOT DETECTED Final   Cryptosporidium NOT DETECTED NOT DETECTED Final   Cyclospora cayetanensis NOT DETECTED NOT  DETECTED Final   Entamoeba histolytica NOT DETECTED NOT DETECTED Final   Giardia lamblia NOT DETECTED NOT DETECTED Final   Adenovirus F40/41 NOT DETECTED NOT DETECTED Final   Astrovirus NOT DETECTED NOT DETECTED Final   Norovirus GI/GII NOT DETECTED NOT DETECTED Final   Rotavirus A NOT DETECTED NOT DETECTED Final   Sapovirus (I, II, IV, and V) NOT DETECTED NOT DETECTED Final    Comment: Performed at [facility], [address] [place], [place] [address]     Time coordinating discharge: Over 30 minutes  SIGNED:   Alvira Philips [place], DO  [facility] 12/10/2022, 9:58 AM

## 2022-12-10 NOTE — Progress Notes (Signed)
Mobility Specialist - Progress Note   12/10/22 0927  Mobility  Activity Ambulated independently in hallway  Level of Assistance Independent  Assistive Device None  Distance Ambulated (ft) 750 ft  Activity Response Tolerated well  Mobility Referral Yes  $Mobility charge 1 Mobility   Pt received in bed and agreed to mobility, had no issues throughout session, returned to bed with all needs met.  Marilynne Halsted Mobility Specialist

## 2022-12-10 NOTE — Progress Notes (Signed)
Assessment & Plan: Rectal adenocarcinoma s/p LAR and diverting ileostomy by Dr. Cliffton Asters 08/23/2022, currently on chemotherapy, admitted with AKI due to high ileostomy output.  - ileostomy output >2L over last 24 hours - continue fiber and bid imodium and Lomotil  FEN: carb mod ID: none VTE: okay for chemical ppx from surgical standpoint  If the output < 2L, he can be discharged from a surgical standpoint.  He will need close follow-up from his medical team to manage his output.  Patient states planning IV bolus at [facility] twice a week.  Will arrange follow up at [facility] with Dr. Angelena Form.        Darnell Level, MD [facility] practice Office: [phone]        Chief Complaint: Rectal Ca, high ileostomy output  Subjective: Patient in bed, comfortable, no acute issues.  Objective: Vital signs in last 24 hours: Temp:  [98.3 F (36.8 C)-98.5 F (36.9 C)] 98.5 F (36.9 C) (04/29 0444) Pulse Rate:  [65-72] 72 (04/29 0444) Resp:  [16-20] 16 (04/29 0444) BP: (136-173)/(78-97) 163/92 (04/29 0444) SpO2:  [94 %-99 %] 94 % (04/29 0444) Last BM Date : 12/09/22  Intake/Output from previous day: 04/28 0701 - 04/29 0700 In: 1460.2 [P.O.:960; IV Piggyback:500.2] Out: 2675 [Urine:1250; Stool:1425] Intake/Output this shift: No intake/output data recorded.  Physical Exam: HEENT - sclerae clear, mucous membranes moist Neck - soft Abdomen - soft without distension; ostomy on right viable, succus in bag Ext - no edema, non-tender Neuro - alert & oriented, no focal deficits  Lab Results:  No results for input(s): "WBC", "HGB", "HCT", "PLT" in the last 72 hours. BMET Recent Labs    12/09/22 0817 12/10/22 0200  NA 134* 133*  K 4.3 4.1  CL 104 104  CO2 20* 21*  GLUCOSE 112* 117*  BUN 14 14  CREATININE 1.43* 1.34*  CALCIUM 9.2 8.9   PT/INR No results for input(s): "LABPROT", "INR" in the last 72 hours. Comprehensive Metabolic Panel:     Component Value Date/Time   NA 133 (L) 12/10/2022 0200   NA 134 (L) 12/09/2022 0817   K 4.1 12/10/2022 0200   K 4.3 12/09/2022 0817   CL 104 12/10/2022 0200   CL 104 12/09/2022 0817   CO2 21 (L) 12/10/2022 0200   CO2 20 (L) 12/09/2022 0817   BUN 14 12/10/2022 0200   BUN 14 12/09/2022 0817   CREATININE 1.34 (H) 12/10/2022 0200   CREATININE 1.43 (H) 12/09/2022 0817   CREATININE 2.15 (H) 12/04/2022 1027   CREATININE 2.11 (H) 11/30/2022 1145   CREATININE 1.19 06/09/2020 1613   CREATININE 1.04 01/20/2014 1716   GLUCOSE 117 (H) 12/10/2022 0200   GLUCOSE 112 (H) 12/09/2022 0817   CALCIUM 8.9 12/10/2022 0200   CALCIUM 9.2 12/09/2022 0817   AST 40 12/04/2022 1027   AST 27 12/02/2022 1012   AST 30 12/01/2022 0350   AST 36 11/30/2022 1145   ALT 36 12/04/2022 1027   ALT 20 12/02/2022 1012   ALT 23 12/01/2022 0350   ALT 25 11/30/2022 1145   ALKPHOS 69 12/04/2022 1027   ALKPHOS 59 12/02/2022 1012   BILITOT 0.7 12/04/2022 1027   BILITOT 0.2 (L) 12/02/2022 1012   BILITOT 0.6 12/01/2022 0350   BILITOT 0.8 11/30/2022 1145   PROT 7.5 12/04/2022 1027   PROT 5.5 (L) 12/02/2022 1012   ALBUMIN 4.3 12/04/2022 1027   ALBUMIN 2.9 (L) 12/02/2022 1012    Studies/Results: No results found.  Darnell Level 12/10/2022  Patient ID: Robert [name], male   DOB: 1971/02/23, 52 y.o.   MRN: [id number]

## 2022-12-11 ENCOUNTER — Telehealth: Payer: Self-pay

## 2022-12-11 NOTE — Transitions of Care (Post Inpatient/ED Visit) (Signed)
   12/11/2022  Name: Robert [name] MRN: [id number] DOB: [birth year]  Today's TOC FU Call Status: Today's TOC FU Call Status:: Successful TOC FU Call Competed TOC FU Call Complete Date: 12/11/22  Transition Care Management Follow-up Telephone Call Date of Discharge: 12/10/22 Discharge Facility: [facility]) Type of Discharge: Inpatient Admission Primary Inpatient Discharge Diagnosis:: acute kidney failure How have you been since you were released from the hospital?: Better Any questions or concerns?: No  Items Reviewed: Did you receive and understand the discharge instructions provided?: Yes Medications obtained and verified?: Yes (Medications Reviewed) Any new allergies since your discharge?: No Dietary orders reviewed?: Yes Do you have support at home?: Yes People in Home: spouse  Home Care and Equipment/Supplies: Were Home Health Services Ordered?: NA Any new equipment or medical supplies ordered?: NA  Functional Questionnaire: Do you need assistance with bathing/showering or dressing?: No Do you need assistance with meal preparation?: No Do you need assistance with eating?: No Do you have difficulty maintaining continence: No Do you need assistance with getting out of bed/getting out of a chair/moving?: No Do you have difficulty managing or taking your medications?: No  Follow up appointments reviewed: PCP Follow-up appointment confirmed?: Yes Date of PCP follow-up appointment?: 12/27/22 Follow-up Provider: [facility] Follow-up appointment confirmed?: No Reason Specialist Follow-Up Not Confirmed: Patient has Specialist Provider Number and will Call for Appointment Do you need transportation to your follow-up appointment?: No Do you understand care options if your condition(s) worsen?: Yes-patient verbalized understanding  Patient wanted to keep his already scheduled appt.  SIGNATURE Karena Addison, LPN [facility] Nurse Health Advisor Direct Dial  [phone]

## 2022-12-12 ENCOUNTER — Encounter: Payer: Self-pay | Admitting: Family Medicine

## 2022-12-12 ENCOUNTER — Other Ambulatory Visit: Payer: Self-pay | Admitting: Family Medicine

## 2022-12-12 DIAGNOSIS — M542 Cervicalgia: Secondary | ICD-10-CM

## 2022-12-12 MED ORDER — TIZANIDINE HCL 2 MG PO TABS
2.0000 mg | ORAL_TABLET | Freq: Three times a day (TID) | ORAL | 2 refills | Status: AC | PRN
Start: 2022-12-12 — End: ?

## 2022-12-19 MED FILL — Dexamethasone Sodium Phosphate Inj 100 MG/10ML: INTRAMUSCULAR | Qty: 1 | Status: AC

## 2022-12-19 NOTE — Progress Notes (Unsigned)
[facility]   [phone] [phone]   Clinic Follow up Note   Patient Care Team: Bradd Canary, MD as PCP - General (Family Medicine) Christell Constant, MD as PCP - Cardiology (Cardiology) Malachy Mood, MD as Consulting Physician (Oncology) Pollyann Samples, NP as Nurse Practitioner (Oncology) Marily Lente, RN as Oncology Nurse Navigator (Oncology)  Date of Service:  12/20/2022  CHIEF COMPLAINT: f/u of rectal cancer   CURRENT THERAPY:   Adjuvant chemo FOLFOX x3 months starting 10/10/22   ASSESSMENT:  Robert [name] is a 52 y.o. male with   Rectal cancer (HCC) -We reviewed his medical record in detail with the patient and his wife.  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 CT negative for distant metastasis -local staging MRI showed T1/T2, N0 rectal cancer and likely reactive inguinal nodes.  -rectal EUS showed uT2, uN0 disease, case reviewed in multidisciplinary conference and the recommendation was for upfront surgery -Proceeded with LAR and diverting ileostomy with Dr. Cliffton Asters on 08/23/2022. I reviewed surgical path which showed invasive grade 2 adenocarcinoma spanning 2.5 cm involving the rectum with carcinoma invading focally into the perirectal adipose tissue.  0/14 lymph nodes involved with clear margins and no LVI. Stage pT3 pN0, IIA, with no high risk features, MMR normal and MSI stable  -Case again discussed in conference and the recommendation is for 3 months adjuvant chemo. Dr. Mitzi Hansen felt that he could forego radiation -Began adjuvant FOLFOX 10/10/2022, s/p 4 cycles last given 11/21/22, cycle 5 held due to diarrhea and AKI which required hospital admission. -He has recovered well from hospital admission, diarrhea much improved.  He has gained some weight  back. -Lab reviewed, adequate for treatment, will proceed to cycle 5 FOLFOX today with dose reduction of 5-FU to minimize his side effect especially diarrhea. -Routine 2 weeks for last cycle chemo.  Diarrhea and AKI -He has a ileostomy -He has had significant diarrhea after chemotherapy and related to AKI required hospital admission twice. -Improved with supportive care and symptom management.  He knows to take Imodium and Lomotil -Continue monitoring.  PLAN: -CBC reviewed CMP -pending -Proceed with C5 folfox with reduce dose due to severe diarrhea -lab/flush, f/u  and last cycle in 2 weeks    SUMMARY OF ONCOLOGIC HISTORY: Oncology History Overview Note   Cancer Staging  Rectal cancer [facility]) Staging form: Colon and Rectum, AJCC 8th Edition - Pathologic stage from 08/23/2022: Stage IIA (pT3, pN0, cM0) - Signed by Pollyann Samples, NP on 09/10/2022 Stage prefix: Initial diagnosis Total positive nodes: 0     Rectal cancer (HCC)  05/09/2022 Procedure   Colonoscopy by Dr. Russella Dar, impression - Five 5 to 7 mm polyps in the rectum, in the transverse colon and at the hepatic flexure, removed with a cold snare. Resected and retrieved. removed with a cold snare. Resected and retrieved. 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 Submucosal injection tattoo. - Internal hemorrhoids. - The examination was otherwise normal on direct and retroflexion views.   05/09/2022 Initial Biopsy   Diagnosis 1. Surgical [P], colon, transverse x1 and hepatic flexure x 3 (2 pieces only, 2 stuck together taken off together), polyp (4) - TUBULAR ADENOMA(S) - NEGATIVE FOR HIGH-GRADE DYSPLASIA OR MALIGNANCY 2. Surgical [P], colon, rectal mass - INVASIVE MODERATELY DIFFERENTIATED ADENOCARCINOMA. SEE NOTE 3. Surgical [P], colon, rectum, polyp (  1) - HYPERPLASTIC POLYP   05/09/2022 Tumor Marker   CEA < 2.0   05/17/2022 Imaging   IMPRESSION: 1. Known primary malignancy is not well visualized. Correlate  with recent colonoscopy. 2. No evidence of metastatic disease in the chest, abdomen or pelvis. 3. Mildly enlarged bilateral inguinal lymph nodes, likely reactive. 4. 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 Recommend annual imaging followup by CTA or MRA. This recommendation follows 2010 ACCF/AHA/AATS/ACR/ASA/SCA/SCAI/SIR/STS/SVM Guidelines for the Diagnosis and Management of Patients with Thoracic Aortic Disease. Circulation. 2010; 121: O130-Q657. 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 Severe coronary artery calcifications of the LAD and circumflex. 6. Aortic Atherosclerosis (ICD10-I70.0).   05/23/2022 Initial Diagnosis   Rectal cancer (HCC)   08/23/2022 Cancer Staging   Staging form: Colon and Rectum, AJCC 8th Edition - Pathologic stage from 08/23/2022: Stage IIA (pT3, pN0, cM0) - Signed by Pollyann Samples, NP on 09/10/2022 Stage prefix: Initial diagnosis Total positive nodes: 0   08/23/2022 Surgery   PROCEDURE:  Robotic assisted low anterior resection with diverting loop ileostomy SURGEON: Stephanie Coup. Cliffton Asters, MD ASSISTANT: Romie Levee, MD   08/23/2022 Pathology Results   FINAL MICROSCOPIC DIAGNOSIS:  A. COLON, RECTOSIGMOID, RESECTION:  - Invasive moderately differentiated adenocarcinoma, 2.5 cm, involving rectum  - Carcinoma invades focally into the perirectal adipose tissue  - Resection margins are negative for carcinoma  - Negative for lymphovascular invasion  - Fourteen lymph nodes, negative for carcinoma (0/14)  - See oncology table  B. DISTAL MARGIN DONUT:  - Rectal donut, negative for carcinoma   Regional Lymph Nodes:       Number of Lymph Nodes with Tumor: 0       Number of Lymph Nodes Examined: 14  Pathologic Stage Classification (pTNM, AJCC 8th Edition): pT3, pN0  Mismatch Repair Protein (IHC)  SUMMARY INTERPRETATION: NORMAL  MSI-Stable   09/27/2022 - 09/27/2022 Chemotherapy   Patient is on Treatment Plan : RECTAL Xelox (Capeox) (130/850) q21d x 6  cycles     10/10/2022 -  Chemotherapy   Patient is on Treatment Plan : COLORECTAL FOLFOX q14d x 3 months        INTERVAL HISTORY:  Robert [name] is here for a follow up of  rectal cancer. He was last seen by NP Lacie on 12/04/2022. He presents to the clinic alone. Pt sate that he has some back spasm from taking the lomotil 5 times a day.Pt took muscle relaxer for the pain. Pt states that his BM is loose, and thickens through out the day,but is tolerable.Increase of the Imodium and the lomotil seems to help. Pt state that his cold sensitivity has subside, bur not as bad,  he just have dry skin. Pt deny having mouth sore but sensitivity within the mouth.  All other systems were reviewed with the patient and are negative.  MEDICAL HISTORY:  Past Medical History:  Diagnosis Date   Arthralgia 01/11/2013   Diffuse, b/l Hips R>L Knees, ankles, low back   Arthritis    Back pain 12/08/2015   Cancer (HCC)    Coronary artery disease    Diabetes mellitus type 2 in obese 11/09/2012   Dyslipidemia 11/09/2012   Esophageal reflux 12/08/2015   Feeling grief 06/09/2020   History of migraine headaches    Hyperglycemia 11/09/2012   Hypertension     SURGICAL HISTORY: Past Surgical History:  Procedure Laterality Date   DIVERTING ILEOSTOMY N/A 08/23/2022   Procedure: DIVERTING LOOP ILEOSTOMY;  Surgeon: Andria Meuse, MD;  Location: [facility];  Service: General;  Laterality: N/A;   EUS N/A 06/21/2022   Procedure: LOWER ENDOSCOPIC ULTRASOUND (EUS);  Surgeon: Lemar Lofty., MD;  Location: [facility];  Service: Gastroenterology;  Laterality: N/A;   FLEXIBLE SIGMOIDOSCOPY N/A 06/21/2022   Procedure: FLEXIBLE SIGMOIDOSCOPY;  Surgeon: Meridee Score Netty Starring., MD;  Location: [facility];  Service: Gastroenterology;  Laterality: N/A;   FLEXIBLE SIGMOIDOSCOPY N/A 08/23/2022   Procedure: FLEXIBLE SIGMOIDOSCOPY;  Surgeon: Andria Meuse, MD;  Location: [facility];  Service: General;  Laterality:  N/A;   PORTACATH PLACEMENT N/A 09/21/2022   Procedure: INSERTION PORT-A-CATH WITH ULTRASOUND GUIDANCE;  Surgeon: Andria Meuse, MD;  Location: [facility];  Service: General;  Laterality: N/A;   XI ROBOTIC ASSISTED LOWER ANTERIOR RESECTION N/A 08/23/2022   Procedure: XI ROBOTIC ASSISTED LOWER ANTERIOR RESECTION with Introperative assessment of profusion and TAP Block;  Surgeon: Andria Meuse, MD;  Location: [facility];  Service: General;  Laterality: N/A;    I have reviewed the social history and family history with the patient and they are unchanged from previous note.  ALLERGIES:  is allergic to advil [ibuprofen], aleve [naproxen], mucinex [guaifenesin er], nsaids, and latex.  MEDICATIONS:  Current Outpatient Medications  Medication Sig Dispense Refill   amLODipine (NORVASC) 5 MG tablet Take 5 mg by mouth daily as needed (for a systolic number reading greater than 150 or as otherwise directed for elevated B/P).     carvedilol (COREG) 25 MG tablet Take 1 tablet (25 mg total) by mouth 2 (two) times daily. 180 tablet 3   diphenoxylate-atropine (LOMOTIL) 2.5-0.025 MG tablet Take 1 tablet by mouth 4 (four) times daily for 21 days. 84 tablet 0   glucose blood (COOL BLOOD GLUCOSE TEST STRIPS) test strip Check blood sugar once daily 100 each 12   lidocaine-prilocaine (EMLA) cream Apply 1 Application topically as needed (for port access).     loperamide (IMODIUM) 2 MG capsule Take 2 capsules (4 mg total) by mouth 4 (four) times daily for 21 days. 168 capsule 0   metFORMIN (GLUCOPHAGE) 500 MG tablet Take 1 tablet (500 mg total) by mouth daily with breakfast. (Patient taking differently: Take 500 mg by mouth at bedtime.) 90 tablet 1   ondansetron (ZOFRAN) 8 MG tablet Take 8 mg by mouth every 6 (six) hours as needed for nausea or vomiting.     pantoprazole (PROTONIX) 40 MG tablet Take 1 tablet (40 mg total) by mouth daily. (Patient taking differently: Take 40 mg by mouth at bedtime.) 90 tablet 1    polycarbophil (FIBERCON) 625 MG tablet Take 1 tablet (625 mg total) by mouth daily. 30 tablet 2   prochlorperazine (COMPAZINE) 10 MG tablet Take 10 mg by mouth every 6 (six) hours as needed for nausea or vomiting.     sitaGLIPtin (JANUVIA) 100 MG tablet Take 1 tablet (100 mg total) by mouth daily. 30 tablet 4   tiZANidine (ZANAFLEX) 2 MG tablet Take 1-2 tablets (2-4 mg total) by mouth 3 (three) times daily as needed for muscle spasms. 60 tablet 2   No current facility-administered medications for this visit.   Facility-Administered Medications Ordered in Other Visits  Medication Dose Route Frequency Provider Last Rate Last Admin   dexamethasone (DECADRON) 10 mg in sodium chloride 0.9 % 50 mL IVPB  10 mg Intravenous Once Malachy Mood, MD 204 mL/hr at 12/20/22 1142 10 mg at 12/20/22 1142   fluorouracil (ADRUCIL) 5,000 mg in sodium chloride 0.9 % 150 mL chemo infusion  2,000 mg/m2 (Treatment Plan Recorded)  Intravenous 1 day or 1 dose Malachy Mood, MD       leucovorin 1,016 mg in dextrose 5 % 250 mL infusion  400 mg/m2 (Treatment Plan Recorded) Intravenous Once Malachy Mood, MD       oxaliplatin (ELOXATIN) 200 mg in dextrose 5 % 500 mL chemo infusion  85 mg/m2 (Treatment Plan Recorded) Intravenous Once Malachy Mood, MD        PHYSICAL EXAMINATION: ECOG PERFORMANCE STATUS: 1 - Symptomatic but completely ambulatory  Vitals:   12/20/22 1016  BP: 132/79  Pulse: 78  Resp: 16  Temp: 98.6 F (37 C)  SpO2: 97%   Wt Readings from Last 3 Encounters:  12/20/22 253 lb 14.4 oz (115.2 kg)  12/04/22 242 lb 8.1 oz (110 kg)  11/30/22 243 lb 12.8 oz (110.6 kg)     GENERAL:alert, no distress and comfortable SKIN: skin color, texture, turgor are normal, no rashes or significant lesions EYES: normal, Conjunctiva are pink and non-injected, sclera clear Musculoskeletal:no cyanosis of digits and no clubbing  NEURO: alert & oriented x 3 with fluent speech, no focal motor/sensory deficits  LABORATORY DATA:  I have  reviewed the data as listed    Latest Ref Rng & Units 12/20/2022    9:50 AM 12/05/2022    8:34 AM 12/04/2022   10:27 AM  CBC  WBC 4.0 - 10.5 K/uL 6.8  5.5  7.1   Hemoglobin 13.0 - 17.0 g/dL 16.1  09.6  04.5   Hematocrit 39.0 - 52.0 % 36.2  35.2  39.4   Platelets 150 - 400 K/uL 219  102  113         Latest Ref Rng & Units 12/20/2022    9:50 AM 12/10/2022    2:00 AM 12/09/2022    8:17 AM  CMP  Glucose 70 - 99 mg/dL 409  811  914   BUN 6 - 20 mg/dL 13  14  14    Creatinine 0.61 - 1.24 mg/dL 7.82  9.56  2.13   Sodium 135 - 145 mmol/L 138  133  134   Potassium 3.5 - 5.1 mmol/L 4.2  4.1  4.3   Chloride 98 - 111 mmol/L 104  104  104   CO2 22 - 32 mmol/L 26  21  20    Calcium 8.9 - 10.3 mg/dL 9.5  8.9  9.2   Total Protein 6.5 - 8.1 g/dL 7.4     Total Bilirubin 0.3 - 1.2 mg/dL 0.4     Alkaline Phos 38 - 126 U/L 75     AST 15 - 41 U/L 25     ALT 0 - 44 U/L 19         RADIOGRAPHIC STUDIES: I have personally reviewed the radiological images as listed and agreed with the findings in the report. No results found.    No orders of the defined types were placed in this encounter.  All questions were answered. The patient knows to call the clinic with any problems, questions or concerns. No barriers to learning was detected. The total time spent in the appointment was 25 minutes.     Malachy Mood, MD 12/20/2022   Carolin Coy, CMA, am acting as scribe for Malachy Mood, MD.   I have reviewed the above documentation for accuracy and completeness, and I agree with the above.

## 2022-12-20 ENCOUNTER — Inpatient Hospital Stay: Payer: BLUE CROSS/BLUE SHIELD | Attending: Nurse Practitioner | Admitting: Hematology

## 2022-12-20 ENCOUNTER — Inpatient Hospital Stay: Payer: BLUE CROSS/BLUE SHIELD | Admitting: Dietician

## 2022-12-20 ENCOUNTER — Inpatient Hospital Stay: Payer: BLUE CROSS/BLUE SHIELD

## 2022-12-20 ENCOUNTER — Other Ambulatory Visit: Payer: Self-pay

## 2022-12-20 ENCOUNTER — Encounter: Payer: Self-pay | Admitting: Hematology

## 2022-12-20 VITALS — BP 132/79 | HR 78 | Temp 98.6°F | Resp 16 | Ht 75.0 in | Wt 253.9 lb

## 2022-12-20 VITALS — BP 115/76 | HR 66

## 2022-12-20 DIAGNOSIS — R197 Diarrhea, unspecified: Secondary | ICD-10-CM | POA: Insufficient documentation

## 2022-12-20 DIAGNOSIS — C20 Malignant neoplasm of rectum: Secondary | ICD-10-CM | POA: Insufficient documentation

## 2022-12-20 DIAGNOSIS — M62830 Muscle spasm of back: Secondary | ICD-10-CM | POA: Insufficient documentation

## 2022-12-20 DIAGNOSIS — Z452 Encounter for adjustment and management of vascular access device: Secondary | ICD-10-CM | POA: Diagnosis not present

## 2022-12-20 DIAGNOSIS — N179 Acute kidney failure, unspecified: Secondary | ICD-10-CM | POA: Insufficient documentation

## 2022-12-20 DIAGNOSIS — Z5111 Encounter for antineoplastic chemotherapy: Secondary | ICD-10-CM | POA: Insufficient documentation

## 2022-12-20 DIAGNOSIS — Z95828 Presence of other vascular implants and grafts: Secondary | ICD-10-CM

## 2022-12-20 LAB — CBC WITH DIFFERENTIAL (CANCER CENTER ONLY)
Abs Immature Granulocytes: 0.13 10*3/uL — ABNORMAL HIGH (ref 0.00–0.07)
Basophils Absolute: 0.1 10*3/uL (ref 0.0–0.1)
Basophils Relative: 1 %
Eosinophils Absolute: 0.3 10*3/uL (ref 0.0–0.5)
Eosinophils Relative: 4 %
HCT: 36.2 % — ABNORMAL LOW (ref 39.0–52.0)
Hemoglobin: 12 g/dL — ABNORMAL LOW (ref 13.0–17.0)
Immature Granulocytes: 2 %
Lymphocytes Relative: 25 %
Lymphs Abs: 1.7 10*3/uL (ref 0.7–4.0)
MCH: 28.2 pg (ref 26.0–34.0)
MCHC: 33.1 g/dL (ref 30.0–36.0)
MCV: 85.2 fL (ref 80.0–100.0)
Monocytes Absolute: 0.6 10*3/uL (ref 0.1–1.0)
Monocytes Relative: 9 %
Neutro Abs: 4 10*3/uL (ref 1.7–7.7)
Neutrophils Relative %: 59 %
Platelet Count: 219 10*3/uL (ref 150–400)
RBC: 4.25 MIL/uL (ref 4.22–5.81)
RDW: 19.5 % — ABNORMAL HIGH (ref 11.5–15.5)
WBC Count: 6.8 10*3/uL (ref 4.0–10.5)
nRBC: 0.3 % — ABNORMAL HIGH (ref 0.0–0.2)

## 2022-12-20 LAB — CMP (CANCER CENTER ONLY)
ALT: 19 U/L (ref 0–44)
AST: 25 U/L (ref 15–41)
Albumin: 3.9 g/dL (ref 3.5–5.0)
Alkaline Phosphatase: 75 U/L (ref 38–126)
Anion gap: 8 (ref 5–15)
BUN: 13 mg/dL (ref 6–20)
CO2: 26 mmol/L (ref 22–32)
Calcium: 9.5 mg/dL (ref 8.9–10.3)
Chloride: 104 mmol/L (ref 98–111)
Creatinine: 1.57 mg/dL — ABNORMAL HIGH (ref 0.61–1.24)
GFR, Estimated: 52 mL/min — ABNORMAL LOW (ref >60)
Glucose, Bld: 147 mg/dL — ABNORMAL HIGH (ref 70–99)
Potassium: 4.2 mmol/L (ref 3.5–5.1)
Sodium: 138 mmol/L (ref 135–145)
Total Bilirubin: 0.4 mg/dL (ref 0.3–1.2)
Total Protein: 7.4 g/dL (ref 6.5–8.1)

## 2022-12-20 MED ORDER — SODIUM CHLORIDE 0.9% FLUSH
10.0000 mL | INTRAVENOUS | Status: DC | PRN
  Administered 2022-12-20: 10 mL

## 2022-12-20 MED ORDER — SODIUM CHLORIDE 0.9% FLUSH
10.0000 mL | Freq: Once | INTRAVENOUS | Status: AC
  Administered 2022-12-20: 10 mL

## 2022-12-20 MED ORDER — OXALIPLATIN CHEMO INJECTION 100 MG/20ML
85.0000 mg/m2 | Freq: Once | INTRAVENOUS | Status: AC
  Administered 2022-12-20: 200 mg via INTRAVENOUS
  Filled 2022-12-20: qty 40

## 2022-12-20 MED ORDER — SODIUM CHLORIDE 0.9 % IV SOLN
10.0000 mg | Freq: Once | INTRAVENOUS | Status: AC
  Administered 2022-12-20: 10 mg via INTRAVENOUS
  Filled 2022-12-20: qty 10

## 2022-12-20 MED ORDER — PALONOSETRON HCL INJECTION 0.25 MG/5ML
0.2500 mg | Freq: Once | INTRAVENOUS | Status: AC
  Administered 2022-12-20: 0.25 mg via INTRAVENOUS
  Filled 2022-12-20: qty 5

## 2022-12-20 MED ORDER — SODIUM CHLORIDE 0.9 % IV SOLN: Freq: Once | INTRAVENOUS | Status: AC

## 2022-12-20 MED ORDER — LEUCOVORIN CALCIUM INJECTION 350 MG
400.0000 mg/m2 | Freq: Once | INTRAVENOUS | Status: AC
  Administered 2022-12-20: 1016 mg via INTRAVENOUS
  Filled 2022-12-20: qty 17.5

## 2022-12-20 MED ORDER — DEXTROSE 5 % IV SOLN: Freq: Once | INTRAVENOUS | Status: AC

## 2022-12-20 MED ORDER — SODIUM CHLORIDE 0.9 % IV SOLN
2000.0000 mg/m2 | INTRAVENOUS | Status: DC
  Administered 2022-12-20: 5000 mg via INTRAVENOUS
  Filled 2022-12-20: qty 100

## 2022-12-20 NOTE — Progress Notes (Signed)
Nutrition Follow-up:  Patient with colorectal cancer. S/p LAR with ileostomy (08/23/22). He is receiving adjuvant chemotherapy with dose reduced FOLFOX q14d.   4/23-4/29 hospital admission - AKI secondary to high ileostomy output  Met with patient in infusion. He reports diarrhea has much improved. He is taking lomotil and imodium as needed. Stools have been more formed. He is changing bag ~4 times/day which is improved from 6-8 times/day. Patient has felt good since holding last chemotherapy. He has been enjoying ice cold water. Foods are tasting better. Patient hoping to experience less diarrhea and nausea after treatment with dose reduction.   Medications: reviewed   Labs: glucose 147, Cr 1.57  Anthropometrics: Wt 253 lb 14.4 oz today increased  4/23 - 242 lb 8.1 oz 4/10 - 256 lb 1.6 oz    NUTRITION DIAGNOSIS: Unintended weight loss improving     INTERVENTION:  Continue drinking Ensure for added calories and protein, encouraged twice daily as tolerated    MONITORING, EVALUATION, GOAL: weight intake,    NEXT VISIT: To be scheduled as needed

## 2022-12-20 NOTE — Patient Instructions (Signed)
[facility]  Discharge Instructions: Thank you for choosing [facility] to provide your oncology and hematology care.   If you have a lab appointment with the Cancer Center, please go directly to the Cancer Center and check in at the registration area.   Wear comfortable clothing and clothing appropriate for easy access to any Portacath or PICC line.   We strive to give you quality time with your provider. You may need to reschedule your appointment if you arrive late (15 or more minutes).  Arriving late affects you and other patients whose appointments are after yours.  Also, if you miss three or more appointments without notifying the office, you may be dismissed from the clinic at the provider's discretion.      For prescription refill requests, have your pharmacy contact our office and allow 72 hours for refills to be completed.    Today you received the following chemotherapy and/or immunotherapy agents: Oxaliplatin, Leucovorin, 5FU      To help prevent nausea and vomiting after your treatment, we encourage you to take your nausea medication as directed.  BELOW ARE SYMPTOMS THAT SHOULD BE REPORTED IMMEDIATELY: *FEVER GREATER THAN 100.4 F (38 C) OR HIGHER *CHILLS OR SWEATING *NAUSEA AND VOMITING THAT IS NOT CONTROLLED WITH YOUR NAUSEA MEDICATION *UNUSUAL SHORTNESS OF BREATH *UNUSUAL BRUISING OR BLEEDING *URINARY PROBLEMS (pain or burning when urinating, or frequent urination) *BOWEL PROBLEMS (unusual diarrhea, constipation, pain near the anus) TENDERNESS IN MOUTH AND THROAT WITH OR WITHOUT PRESENCE OF ULCERS (sore throat, sores in mouth, or a toothache) UNUSUAL RASH, SWELLING OR PAIN  UNUSUAL VAGINAL DISCHARGE OR ITCHING   Items with * indicate a potential emergency and should be followed up as soon as possible or go to the Emergency Department if any problems should occur.  Please show the CHEMOTHERAPY ALERT CARD or IMMUNOTHERAPY  ALERT CARD at check-in to the Emergency Department and triage nurse.  Should you have questions after your visit or need to cancel or reschedule your appointment, please contact [facility]  Dept: [phone]  and follow the prompts.  Office hours are 8:00 a.m. to 4:30 p.m. Monday - Friday. Please note that voicemails left after 4:00 p.m. may not be returned until the following business day.  We are closed weekends and major holidays. You have access to a nurse at all times for urgent questions. Please call the main number to the clinic Dept: [phone] and follow the prompts.   For any non-urgent questions, you may also contact your provider using MyChart. We now offer e-Visits for anyone 18 and older to request care online for non-urgent symptoms. For details visit [email or web address].   Also download the MyChart app! Go to the app store, search "MyChart", open the app, select [facility], and log in with your MyChart username and password.   

## 2022-12-20 NOTE — Progress Notes (Signed)
Per Dr. Mosetta Putt, [facility] to treat with Scr 1.57

## 2022-12-20 NOTE — Assessment & Plan Note (Signed)
-  We reviewed his medical record in detail with the patient and his wife.  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 CT negative for distant metastasis -local staging MRI showed T1/T2, N0 rectal cancer and likely reactive inguinal nodes.  -rectal EUS showed uT2, uN0 disease, case reviewed in multidisciplinary conference and the recommendation was for upfront surgery -Proceeded with LAR and diverting ileostomy with Dr. Cliffton Asters on 08/23/2022. I reviewed surgical path which showed invasive grade 2 adenocarcinoma spanning 2.5 cm involving the rectum with carcinoma invading focally into the perirectal adipose tissue.  0/14 lymph nodes involved with clear margins and no LVI. Stage pT3 pN0, IIA, with no high risk features, MMR normal and MSI stable  -Case again discussed in conference and the recommendation is for 3 months adjuvant chemo. Dr. Mitzi Hansen felt that he could forego radiation -Began adjuvant FOLFOX 10/10/2022, s/p 4 cycles last given 11/21/22, cycle 5 held due to diarrhea and AKI which required hospital admission.

## 2022-12-22 ENCOUNTER — Inpatient Hospital Stay: Payer: BLUE CROSS/BLUE SHIELD

## 2022-12-22 VITALS — BP 117/73 | HR 59 | Temp 97.9°F | Resp 18

## 2022-12-22 DIAGNOSIS — E860 Dehydration: Secondary | ICD-10-CM

## 2022-12-22 DIAGNOSIS — Z5111 Encounter for antineoplastic chemotherapy: Secondary | ICD-10-CM | POA: Diagnosis not present

## 2022-12-22 DIAGNOSIS — C20 Malignant neoplasm of rectum: Secondary | ICD-10-CM

## 2022-12-22 DIAGNOSIS — Z95828 Presence of other vascular implants and grafts: Secondary | ICD-10-CM

## 2022-12-22 MED ORDER — HEPARIN SOD (PORK) LOCK FLUSH 100 UNIT/ML IV SOLN
500.0000 [IU] | Freq: Once | INTRAVENOUS | Status: AC
  Administered 2022-12-22: 500 [IU]

## 2022-12-22 MED ORDER — SODIUM CHLORIDE 0.9 % IV SOLN: Freq: Once | INTRAVENOUS | Status: DC

## 2022-12-22 MED ORDER — ONDANSETRON HCL 4 MG/2ML IJ SOLN
8.0000 mg | Freq: Once | INTRAMUSCULAR | Status: AC
  Administered 2022-12-22: 8 mg via INTRAVENOUS
  Filled 2022-12-22: qty 4

## 2022-12-22 MED ORDER — SODIUM CHLORIDE 0.9 % IV SOLN: Freq: Once | INTRAVENOUS | Status: AC

## 2022-12-22 MED ORDER — SODIUM CHLORIDE 0.9% FLUSH
10.0000 mL | Freq: Once | INTRAVENOUS | Status: AC
  Administered 2022-12-22: 10 mL

## 2022-12-22 NOTE — Patient Instructions (Signed)

## 2022-12-24 ENCOUNTER — Encounter: Payer: Self-pay | Admitting: Internal Medicine

## 2022-12-24 ENCOUNTER — Other Ambulatory Visit: Payer: Self-pay | Admitting: Surgery

## 2022-12-24 ENCOUNTER — Ambulatory Visit: Payer: BLUE CROSS/BLUE SHIELD | Attending: Internal Medicine | Admitting: Internal Medicine

## 2022-12-24 VITALS — BP 122/68 | HR 67 | Ht 75.0 in | Wt 260.0 lb

## 2022-12-24 DIAGNOSIS — Z9889 Other specified postprocedural states: Secondary | ICD-10-CM

## 2022-12-24 DIAGNOSIS — C20 Malignant neoplasm of rectum: Secondary | ICD-10-CM

## 2022-12-24 DIAGNOSIS — I7121 Aneurysm of the ascending aorta, without rupture: Secondary | ICD-10-CM

## 2022-12-24 DIAGNOSIS — I77819 Aortic ectasia, unspecified site: Secondary | ICD-10-CM | POA: Diagnosis not present

## 2022-12-24 DIAGNOSIS — E1169 Type 2 diabetes mellitus with other specified complication: Secondary | ICD-10-CM | POA: Diagnosis not present

## 2022-12-24 DIAGNOSIS — I351 Nonrheumatic aortic (valve) insufficiency: Secondary | ICD-10-CM | POA: Insufficient documentation

## 2022-12-24 DIAGNOSIS — I2510 Atherosclerotic heart disease of native coronary artery without angina pectoris: Secondary | ICD-10-CM | POA: Insufficient documentation

## 2022-12-24 DIAGNOSIS — E669 Obesity, unspecified: Secondary | ICD-10-CM

## 2022-12-24 DIAGNOSIS — I2584 Coronary atherosclerosis due to calcified coronary lesion: Secondary | ICD-10-CM

## 2022-12-24 DIAGNOSIS — Z7984 Long term (current) use of oral hypoglycemic drugs: Secondary | ICD-10-CM

## 2022-12-24 NOTE — Progress Notes (Addendum)
Cardiology Office Note:    Date:  12/24/2022   ID:  Robert [name], DOB 1971-07-25, MRN [id number]  PCP:  Bradd Canary, MD   [facility] Providers Cardiologist:  Christell Constant, MD     Referring MD: Bradd Canary, MD   CC: Aortopathy  History of Present Illness:    Robert [name] is a 52 y.o. male with a hx of HTN, HLD, DM, CAC.  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  He had new colorectal cancer. Found to have increase in aortic diameter.  Saw anesthesia leading to delay in surgery.  Ann Maki sees Dr. Laneta Simmers as well. 2024: New AKI after chemo due to high output ileostomy. Dr. Mosetta Putt has reduced the overall dosage of chemotherapy.   Patient notes that he is doing ok.   He has had a coupe of hospital direct  No chest pain or pressure .  No SOB/DOB and no PND/Orthopnea.  No weight gain or leg swelling.  No palpitations or syncope. Still feels fatigue. ADDENDUM: Dictation error.  Removed.    Past Medical History:  Diagnosis Date   Arthralgia 01/11/2013   Diffuse, b/l Hips R>L Knees, ankles, low back   Arthritis    Back pain 12/08/2015   Cancer (HCC)    Coronary artery disease    Diabetes mellitus type 2 in obese 11/09/2012   Dyslipidemia 11/09/2012   Esophageal reflux 12/08/2015   Feeling grief 06/09/2020   History of migraine headaches    Hyperglycemia 11/09/2012   Hypertension     Past Surgical History:  Procedure Laterality Date   DIVERTING ILEOSTOMY N/A 08/23/2022   Procedure: DIVERTING LOOP ILEOSTOMY;  Surgeon: Andria Meuse, MD;  Location: [facility];  Service: General;  Laterality: N/A;   EUS N/A 06/21/2022   Procedure: LOWER ENDOSCOPIC ULTRASOUND (EUS);  Surgeon: Lemar Lofty., MD;  Location: [facility];  Service: Gastroenterology;  Laterality: N/A;   FLEXIBLE SIGMOIDOSCOPY N/A 06/21/2022   Procedure: FLEXIBLE SIGMOIDOSCOPY;  Surgeon: Meridee Score Netty Starring., MD;  Location: [facility];  Service: Gastroenterology;  Laterality: N/A;   FLEXIBLE  SIGMOIDOSCOPY N/A 08/23/2022   Procedure: FLEXIBLE SIGMOIDOSCOPY;  Surgeon: Andria Meuse, MD;  Location: [facility];  Service: General;  Laterality: N/A;   PORTACATH PLACEMENT N/A 09/21/2022   Procedure: INSERTION PORT-A-CATH WITH ULTRASOUND GUIDANCE;  Surgeon: Andria Meuse, MD;  Location: [facility];  Service: General;  Laterality: N/A;   XI ROBOTIC ASSISTED LOWER ANTERIOR RESECTION N/A 08/23/2022   Procedure: XI ROBOTIC ASSISTED LOWER ANTERIOR RESECTION with Introperative assessment of profusion and TAP Block;  Surgeon: Andria Meuse, MD;  Location: [facility];  Service: General;  Laterality: N/A;    Current Medications: Current Meds  Medication Sig   amLODipine (NORVASC) 5 MG tablet Take 5 mg by mouth daily as needed (for a systolic number reading greater than 150 or as otherwise directed for elevated B/P).   carvedilol (COREG) 25 MG tablet Take 1 tablet (25 mg total) by mouth 2 (two) times daily.   diphenoxylate-atropine (LOMOTIL) 2.5-0.025 MG tablet Take 1 tablet by mouth 4 (four) times daily for 21 days.   glucose blood (COOL BLOOD GLUCOSE TEST STRIPS) test strip Check blood sugar once daily   lidocaine-prilocaine (EMLA) cream Apply 1 Application topically as needed (for port access).   loperamide (IMODIUM) 2 MG capsule Take 2 capsules (4 mg total) by mouth 4 (four) times daily for 21 days.   metFORMIN (GLUCOPHAGE) 500 MG tablet Take 1 tablet (500 mg total) by mouth daily  with breakfast.   ondansetron (ZOFRAN) 8 MG tablet Take 8 mg by mouth every 6 (six) hours as needed for nausea or vomiting.   pantoprazole (PROTONIX) 40 MG tablet Take 1 tablet (40 mg total) by mouth daily.   polycarbophil (FIBERCON) 625 MG tablet Take 1 tablet (625 mg total) by mouth daily.   prochlorperazine (COMPAZINE) 10 MG tablet Take 10 mg by mouth every 6 (six) hours as needed for nausea or vomiting.   sitaGLIPtin (JANUVIA) 100 MG tablet Take 1 tablet (100 mg total) by mouth daily.   tiZANidine (ZANAFLEX)  2 MG tablet Take 1-2 tablets (2-4 mg total) by mouth 3 (three) times daily as needed for muscle spasms.     Allergies:   Advil [ibuprofen], Aleve [naproxen], Mucinex [guaifenesin er], Nsaids, Levaquin [levofloxacin], and Latex   Social History   Socioeconomic History   Marital status: Married    Spouse name: Not on file   Number of children: 4   Years of education: Not on file   Highest education level: Bachelor's degree (e.g., BA, AB, BS)  Occupational History   Occupation: Community education officer: [employer]  Tobacco Use   Smoking status: Former    Packs/day: 1.50    Years: 10.00    Additional pack years: 0.00    Total pack years: 15.00    Types: Cigarettes    Quit date: 08/14/1995    Years since quitting: 27.3   Smokeless tobacco: Never  Vaping Use   Vaping Use: Never used  Substance and Sexual Activity   Alcohol use: Yes    Alcohol/week: 6.0 - 10.0 standard drinks of alcohol    Types: 6 - 10 Cans of beer per week    Comment: 2/day, more in the past   Drug use: Yes    Types: Marijuana    Comment: marijuana 12/11   Sexual activity: Yes    Comment: lives with wife and daughter, no dietary restrictions  Other Topics Concern   Not on file  Social History Narrative   Regular exercise:  Walks 1-2 x weekly   Caffeine use:  3-4 daily   4 children- oldest first marriage 79 (son), 62 son, 10 son, 34 yr old girl   Married   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[employer] Strain: Low Risk  (12/23/2022)   Overall Financial Resource Strain (CARDIA)    Difficulty of Paying Living Expenses: Not very hard  Food Insecurity: No Food Insecurity (12/23/2022)   Hunger Vital Sign    Worried About Running Out of Food in the Last Year: Never true    Ran Out of Food in the Last Year: Never true  Transportation Needs: No Transportation Needs (12/23/2022)   PRAPARE - Administrator, Civil Service  (Medical): No    Lack of Transportation (Non-Medical): No  Physical Activity: Unknown (12/23/2022)   Exercise Vital Sign    Days of Exercise per Week: 5 days    Minutes of Exercise per Session: Patient declined  Stress: No Stress Concern Present (12/23/2022)   [employer] of Occupational Health - Occupational Stress Questionnaire    Feeling of Stress : Only a little  Social Connections: Moderately Integrated (12/23/2022)   Social Connection and Isolation Panel [NHANES]    Frequency of Communication with Friends and Family: Three times a week    Frequency  of Social Gatherings with Friends and Family: Patient declined    Attends Religious Services: More than 4 times per year    Active Member of [employer] or Organizations: No    Attends Engineer, structural: Not on file    Marital Status: Married     Family History: The patient's family history includes Arthritis in his father, maternal grandmother, mother, and paternal grandmother; COPD in his maternal grandfather; Cancer in his mother and paternal grandmother; Diabetes in his brother, father, maternal grandmother, mother, paternal grandfather, and paternal grandmother; Heart disease (age of onset: 75) in his paternal grandfather; Hyperlipidemia in his brother; Hypertension in his brother, father, and mother; Obesity in his father; Sleep apnea in his father; Stroke in his maternal grandmother. There is no history of Kidney disease.  ROS:   Please see the history of present illness.     All other systems reviewed and are negative.  EKGs/Labs/Other Studies Reviewed:    The following studies were reviewed today:  EKG:   06/19/22: SR LVH   Cardiac Studies & Procedures       ECHOCARDIOGRAM  ECHOCARDIOGRAM COMPLETE 07/13/2022  Narrative ECHOCARDIOGRAM REPORT    Patient Name:   NUTE HEALAN  Date of Exam: 07/13/2022 Medical Rec #:  [id number]     Height:       75.0 in Accession #:    [id number]    Weight:       280.0  lb Date of Birth:  10/19/1970     BSA:          2.532 m Patient Age:    51 years      BP:           140/90 mmHg Patient Gender: M             HR:           68 bpm. Exam Location:  [address]  Procedure: 2D Echo, Cardiac Doppler and Color Doppler  Indications:    I51.7 LVH  History:        Patient has no prior history of Echocardiogram examinations. LVH; Risk Factors:Hypertension, Dyslipidemia and Morbid obesity.  Sonographer:    Samule Ohm RDCS Referring Phys: [id number] [facility] Patsy Varma  IMPRESSIONS   1. Left ventricular ejection fraction, by estimation, is 60 to 65%. The left ventricle has normal function. The left ventricle has no regional wall motion abnormalities. The left ventricular internal cavity size was mildly dilated. There is severe left ventricular hypertrophy. Left ventricular diastolic parameters are consistent with Grade II diastolic dysfunction (pseudonormalization). 2. Right ventricular systolic function is normal. The right ventricular size is mildly enlarged. 3. Left atrial size was moderately dilated. 4. The mitral valve is normal in structure. No evidence of mitral valve regurgitation. No evidence of mitral stenosis. 5. The aortic valve is normal in structure. Aortic valve regurgitation is moderate. No aortic stenosis is present. Aortic regurgitation PHT measures 487 msec. Aortic valve area, by VTI measures 3.18 cm. Aortic valve mean gradient measures 8.5 mmHg. Aortic valve Vmax measures 2.09 m/s. 6. Aortic dilatation noted. There is severe dilatation of the aortic root, measuring 52 mm. There is severe dilatation of the ascending aorta, measuring 50 mm. 7. The inferior vena cava is dilated in size with <50% respiratory variability, suggesting right atrial pressure of 15 mmHg.  FINDINGS Left Ventricle: Left ventricular ejection fraction, by estimation, is 60 to 65%. The left ventricle has normal function. The left ventricle has no regional wall motion  abnormalities. The left ventricular internal cavity size was mildly dilated. There is severe left ventricular hypertrophy. Left ventricular diastolic parameters are consistent with Grade II diastolic dysfunction (pseudonormalization).  Right Ventricle: The right ventricular size is mildly enlarged. No increase in right ventricular wall thickness. Right ventricular systolic function is normal.  Left Atrium: Left atrial size was moderately dilated.  Right Atrium: Right atrial size was normal in size.  Pericardium: Trivial pericardial effusion is present. The pericardial effusion is posterior to the left ventricle.  Mitral Valve: The mitral valve is normal in structure. No evidence of mitral valve regurgitation. No evidence of mitral valve stenosis.  Tricuspid Valve: The tricuspid valve is normal in structure. Tricuspid valve regurgitation is not demonstrated. No evidence of tricuspid stenosis.  Aortic Valve: The aortic valve is normal in structure. Aortic valve regurgitation is moderate. Aortic regurgitation PHT measures 487 msec. No aortic stenosis is present. Aortic valve mean gradient measures 8.5 mmHg. Aortic valve peak gradient measures 17.5 mmHg. Aortic valve area, by VTI measures 3.18 cm.  Pulmonic Valve: The pulmonic valve was normal in structure. Pulmonic valve regurgitation is mild. No evidence of pulmonic stenosis.  Aorta: Aortic dilatation noted. There is severe dilatation of the aortic root, measuring 52 mm. There is severe dilatation of the ascending aorta, measuring 50 mm.  Venous: The inferior vena cava is dilated in size with less than 50% respiratory variability, suggesting right atrial pressure of 15 mmHg.  IAS/Shunts: No atrial level shunt detected by color flow Doppler.   LEFT VENTRICLE PLAX 2D LVIDd:         6.20 cm   Diastology LVIDs:         3.70 cm   LV e' medial:    6.20 cm/s LV PW:         1.40 cm   LV E/e' medial:  18.2 LV IVS:        1.70 cm   LV e'  lateral:   8.59 cm/s LVOT diam:     2.60 cm   LV E/e' lateral: 13.2 LV SV:         134 LV SV Index:   53 LVOT Area:     5.31 cm   RIGHT VENTRICLE             IVC RV S prime:     22.40 cm/s  IVC diam: 2.10 cm TAPSE (M-mode): 2.8 cm  LEFT ATRIUM              Index        RIGHT ATRIUM           Index LA diam:        4.60 cm  1.82 cm/m   RA Pressure: 3.00 mmHg LA Vol (A2C):   108.0 ml 42.66 ml/m  RA Area:     19.50 cm LA Vol (A4C):   118.0 ml 46.61 ml/m  RA Volume:   58.10 ml  22.95 ml/m LA Biplane Vol: 114.0 ml 45.03 ml/m AORTIC VALVE AV Area (Vmax):    3.28 cm AV Area (Vmean):   3.30 cm AV Area (VTI):     3.18 cm AV Vmax:           209.00 cm/s AV Vmean:          133.000 cm/s AV VTI:            0.421 m AV Peak Grad:      17.5 mmHg AV Mean Grad:  8.5 mmHg LVOT Vmax:         129.00 cm/s LVOT Vmean:        82.700 cm/s LVOT VTI:          0.252 m LVOT/AV VTI ratio: 0.60 AI PHT:            487 msec  AORTA Ao Root diam: 5.20 cm Ao Asc diam:  5.00 cm  MITRAL VALVE                TRICUSPID VALVE MV Area (PHT): 2.92 cm     Estimated RAP:  3.00 mmHg MV Decel Time: 260 msec MV E velocity: 113.00 cm/s  SHUNTS MV A velocity: 96.90 cm/s   Systemic VTI:  0.25 m MV E/A ratio:  1.17         Systemic Diam: 2.60 cm  Donato Schultz MD Electronically signed by Donato Schultz MD Signature Date/Time: 07/13/2022/2:15:15 PM    Final      CARDIAC MRI  MR CARDIAC MORPHOLOGY W WO CONTRAST 12/24/2022  Addendum 12/24/2022  8:11 AM ADDENDUM REPORT: 12/24/2022 08:08  ADDENDUM: Reviewing case for potential surgical planning. Though there is heavy breath hold artifact on aortic valve CINE, there is evidence of a small raphe. 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   Electronically Signed By: Riley Lam M.D. On: 12/24/2022 08:08  Narrative CLINICAL DATA:  Clinical question of aortopathy and  aortic regurgitation Study assumes HCT of 37 and BSA of 2.53 m2.  EXAM: CARDIAC MRI  TECHNIQUE: The patient was scanned on a 1.5 Tesla GE magnet. A dedicated cardiac coil was used. Functional imaging was done using Fiesta sequences. 2,3, and 4 chamber views were done to assess for RWMA's. Modified Simpson's rule using a short axis stack was used to calculate an ejection fraction on a dedicated work Research officer, trade union. The patient received 10 cc of Gadavist. After 10 minutes inversion recovery sequences were used to assess for infiltration and scar tissue. Flow quantification was performed 2 times during this examination with flow quantification performed at the levels of the ascending aorta above the valve, pulmonary artery above the valve.  CONTRAST:  10 cc  of Gadavist  FINDINGS: 1. Mild left ventricular dilation, with LVEDD 68 mm, but LVEDVi 99 mL/m2.  Normal left ventricular thickness, with intraventricular septal thickness of 11 mm, posterior wall thickness of 9 mm, and myocardial mass index of 62 g/m2.  Normal left ventricular systolic function (LVEF =63%). There are no regional wall motion abnormalities.  Left ventricular parametric mapping notable for normal T2.  There is mid inferior ECV elevation (43%)  There is late gadolinium enhancement in the left ventricular myocardium: There is basal inferoseptal LGE (inferior RV insertion site LGE) of unclear significance.  There is also mid inferior transmural LGE.  2.  Dilated right ventricular size with RVEDVI 91 mL/m2.  Normal right ventricular thickness.  Normal right ventricular systolic function (RVEF =59%). There are no regional wall motion abnormalities or aneurysms.  3.  Normal left size.  Right atrial dilation with maximal volume 48 ml/m2.  4.  Mild pulmonary artery dilation.  Aortic root is severely dilated 52 mm at sinus to sinus evaluation.  Ascending aorta is moderately dilated in  the ascending aorta, 49 mm.  There is no evidence of aortic dissection.  5. Valve assessment:  Aortic Valve: Thickening of the leaflet tips; tri-leaflet valve. There is moderate aortic regurgitation. Regurgitant  fraction 22%.  Pulmonic Valve: There is no significant regurgitation. Regurgitant fraction < 1%.  Tricuspid Valve: There is mild tricuspid regurgitation Regurgitant fraction 15%.  Mitral Valve: There is no significant regurgitation. Regurgitant fraction 4%.  6.  Normal pericardium.  No pericardial effusion.  7. Grossly, no extracardiac findings. Recommended dedicated study if concerned for non-cardiac pathology.  8.  Breathhold artifact.  Much of study is done free breathing.  IMPRESSION: Severe aortic root dilation.  See MRA for more details.  There is inferior LGE. Given two discrete areas of LGE, clinical correlation for inflammatory disease.  Riley Lam MD  Electronically Signed: By: Riley Lam M.D. On: 10/22/2022 22:30           Recent Labs: 06/19/2022: TSH 0.69 12/10/2022: Magnesium 1.9 12/20/2022: ALT 19; BUN 13; Creatinine 1.57; Hemoglobin 12.0; Platelet Count 219; Potassium 4.2; Sodium 138  Recent Lipid Panel    Component Value Date/Time   CHOL 120 09/29/2022 0735   TRIG 107 09/29/2022 0735   HDL 27 (L) 09/29/2022 0735   CHOLHDL 4.4 09/29/2022 0735   VLDL 21 09/29/2022 0735   LDLCALC 72 09/29/2022 0735   LDLCALC 100 (H) 06/09/2020 1613    Physical Exam:    VS:  BP 122/68   Pulse 67   Ht 6\' 3"  (1.905 m)   Wt 260 lb (117.9 kg)   SpO2 96%   BMI 32.50 kg/m     Wt Readings from Last 3 Encounters:  12/24/22 260 lb (117.9 kg)  12/20/22 253 lb 14.4 oz (115.2 kg)  12/04/22 242 lb 8.1 oz (110 kg)    GEN:  Well nourished, well developed in no acute distress HEENT: Normal NECK: No JVD CARDIAC: RRR, no rubs, gallops; Diastolic murmur noted  RESPIRATORY:  Clear to auscultation without rales, wheezing or rhonchi   ABDOMEN: Soft, non-tender, non-distended MUSCULOSKELETAL:  No edema; No deformity  SKIN: Warm and dry NEUROLOGIC:  Alert and oriented x 3 PSYCHIATRIC:  Normal affect   ASSESSMENT:    1. Aortic ectasia (HCC)   2. Rectal cancer (HCC)   3. S/P robot-assisted surgical procedure   4. Type 2 diabetes mellitus with obesity (HCC)   5. Nonrheumatic aortic valve insufficiency   6. Coronary artery calcification     PLAN:    Severe Aortic Aneurysm  -Bicuspid Aortic Valve Siever's 1 Bicuspid Suspected - Moderate aortic regurgitation - based on current guidelines he is recommneded to repeat CT scan in September (gated CT aorta), repeat echocardiogram 10/2023 and if evidence of LV dilation or dysfunction or if severe aortic dilation to have surgical consideration (no family history of dissection - will reach our to Drs. Corinna Gab, Opal and [place]; if no concerns I would recommend proceeding with surgery - if concerns; we will repeat his testing early - BP controlled on current therapy - he  - 1st degree relative one time screening (he has a brother, two sisers, three sons, and a daughter) - we reviewed his CMR images - Discussed not using Fluoroquinolones/BPA - no evidence of coaracation on imaging   CAC HLD with DM - he has lost 20+ lbs as a side effect of her oncology therapy;  - when he settles out after oncology therapy we may start medication therapy    Type 2 diabetes mellitus - as per PCP   October f/u unless new changes   Time Spent Directly with Patient:   I have spent a total of 53 minutes with the patient reviewing notes, imaging,  EKGs, labs and examining the patient as well as establishing an assessment and plan that was discussed personally with the patient.  > 50% of time was spent in direct patient care and reviewing imaging with patient .   Medication Adjustments/Labs and Tests Ordered: Current medicines are reviewed at length with the patient today.  Concerns  regarding medicines are outlined above.  No orders of the defined types were placed in this encounter.  No orders of the defined types were placed in this encounter.   Patient Instructions  Medication Instructions:  Your physician recommends that you continue on your current medications as directed. Please refer to the Current Medication list given to you today.  *If you need a refill on your cardiac medications before your next appointment, please call your pharmacy*   Lab Work: BMP, CBC please have labs sent to Dr. Izora Ribas  If you have labs (blood work) drawn today and your tests are completely normal, you will receive your results only by: MyChart Message (if you have MyChart) OR A paper copy in the mail If you have any lab test that is abnormal or we need to change your treatment, we will call you to review the results.   Testing/Procedures: NONE   Follow-Up: At [facility], you and your health needs are our priority.  As part of our continuing mission to provide you with exceptional heart care, we have created designated Provider Care Teams.  These Care Teams include your primary Cardiologist (physician) and Advanced Practice Providers (APPs -  Physician Assistants and Nurse Practitioners) who all work together to provide you with the care you need, when you need it.    Your next appointment:   5 month(s)  Provider:   Christell Constant, MD        Signed, Christell Constant, MD  12/24/2022 10:35 AM    [facility]

## 2022-12-24 NOTE — Patient Instructions (Signed)
Medication Instructions:  Your physician recommends that you continue on your current medications as directed. Please refer to the Current Medication list given to you today.  *If you need a refill on your cardiac medications before your next appointment, please call your pharmacy*   Lab Work: BMP, CBC please have labs sent to Dr. Izora Ribas  If you have labs (blood work) drawn today and your tests are completely normal, you will receive your results only by: MyChart Message (if you have MyChart) OR A paper copy in the mail If you have any lab test that is abnormal or we need to change your treatment, we will call you to review the results.   Testing/Procedures: NONE   Follow-Up: At [facility], you and your health needs are our priority.  As part of our continuing mission to provide you with exceptional heart care, we have created designated Provider Care Teams.  These Care Teams include your primary Cardiologist (physician) and Advanced Practice Providers (APPs -  Physician Assistants and Nurse Practitioners) who all work together to provide you with the care you need, when you need it.    Your next appointment:   5 month(s)  Provider:   Christell Constant, MD

## 2022-12-26 ENCOUNTER — Telehealth: Payer: Self-pay | Admitting: *Deleted

## 2022-12-26 NOTE — Assessment & Plan Note (Signed)
They are planning reversal this summer

## 2022-12-26 NOTE — Telephone Encounter (Signed)
   Pre-operative Risk Assessment    Patient Name: Robert [name]  DOB: 08-11-1971 MRN: [id number]      Request for Surgical Clearance    Procedure:   Takedown of diverting loop ileostomy and flexible sigmoidoscopy.  Date of Surgery:  Clearance TBD                                 Surgeon:  Dr. Marin Olp Surgeon's Group or Practice Name:  [facility] Phone number:  [phone] Fax number:  [phone]   Type of Clearance Requested:   - Medical    Type of Anesthesia:  General    Additional requests/questions:    Signed, Emmit Pomfret   12/26/2022, 3:48 PM

## 2022-12-26 NOTE — Assessment & Plan Note (Signed)
Cardiology has recommended a LDL goal of <70 and patient is agreeable but they have recommended we institute therapy after his cancer surgery so we will hold off on adding Atorvastatin 10 mg tabs, 1 daily until after surgery. Maintain heart healthy diet such as Mediterranean, MIND or DASH diet, increase exercise, avoid trans fats, simple carbohydrates and processed foods, consider a krill or fish or flaxseed oil cap daily.   

## 2022-12-26 NOTE — Assessment & Plan Note (Signed)
Well controlled, no changes to meds. Encouraged heart healthy diet such as the DASH diet and exercise as tolerated.  °

## 2022-12-26 NOTE — Assessment & Plan Note (Signed)
Drop Metformin to 500 mg daily and add Januvia If sugar does not improve can consier Ozempic or Mounjaro in the future. . minimize simple carbs. Increase exercise as tolerated. 

## 2022-12-27 ENCOUNTER — Ambulatory Visit (INDEPENDENT_AMBULATORY_CARE_PROVIDER_SITE_OTHER): Payer: BLUE CROSS/BLUE SHIELD | Admitting: Family Medicine

## 2022-12-27 ENCOUNTER — Encounter (HOSPITAL_COMMUNITY): Payer: Self-pay

## 2022-12-27 ENCOUNTER — Telehealth: Payer: Self-pay | Admitting: Family Medicine

## 2022-12-27 ENCOUNTER — Encounter: Payer: Self-pay | Admitting: Family Medicine

## 2022-12-27 ENCOUNTER — Encounter: Payer: Self-pay | Admitting: Hematology

## 2022-12-27 VITALS — BP 130/78 | HR 65 | Temp 98.0°F | Resp 16 | Ht 75.0 in | Wt 255.0 lb

## 2022-12-27 DIAGNOSIS — B070 Plantar wart: Secondary | ICD-10-CM

## 2022-12-27 DIAGNOSIS — E785 Hyperlipidemia, unspecified: Secondary | ICD-10-CM

## 2022-12-27 DIAGNOSIS — B351 Tinea unguium: Secondary | ICD-10-CM

## 2022-12-27 DIAGNOSIS — E1169 Type 2 diabetes mellitus with other specified complication: Secondary | ICD-10-CM

## 2022-12-27 DIAGNOSIS — I10 Essential (primary) hypertension: Secondary | ICD-10-CM

## 2022-12-27 DIAGNOSIS — E669 Obesity, unspecified: Secondary | ICD-10-CM

## 2022-12-27 DIAGNOSIS — Z932 Ileostomy status: Secondary | ICD-10-CM | POA: Diagnosis not present

## 2022-12-27 DIAGNOSIS — Z7984 Long term (current) use of oral hypoglycemic drugs: Secondary | ICD-10-CM | POA: Diagnosis not present

## 2022-12-27 DIAGNOSIS — L600 Ingrowing nail: Secondary | ICD-10-CM

## 2022-12-27 NOTE — Patient Instructions (Signed)
Chronic Kidney Disease, Adult Chronic kidney disease is when lasting damage happens to the kidneys slowly over a long time. The kidneys help to: Make pee (urine). Make hormones. Keep the right amount of fluids and chemicals in the body. Most often, this disease does not go away. You must take steps to help keep the kidney damage from getting worse. If steps are not taken, the kidneys might stop working forever. What are the causes? Diabetes. High blood pressure. Diseases that affect the heart and blood vessels. Other kidney diseases. Diseases of the body's disease-fighting system. A problem with the flow of pee. Infections of the organs that make pee, store it, and take it out of the body. Swelling or irritation of your blood vessels. What increases the risk? Getting older. Having someone in your family who has kidney disease or kidney failure. Having a disease caused by genes. Taking medicines often that harm the kidneys. Being near or having contact with harmful substances. Being very overweight. Using tobacco now or in the past. What are the signs or symptoms? Feeling very tired. Having a swollen face, legs, ankles, or feet. Feeling like you may vomit or vomiting. Not feeling hungry. Being confused or not able to focus. Twitches and cramps in the leg muscles or other muscles. Dry, itchy skin. A taste of metal in your mouth. Making less pee, or making more pee. Shortness of breath. Trouble sleeping. You may also become anemic or get weak bones. Anemic means there is not enough red blood cells or hemoglobin in your blood. You may get symptoms slowly. You may not notice them until the kidney damage gets very bad. How is this treated? Often, there is no cure for this disease. Treatment can help with symptoms and help keep the disease from getting worse. You may need to: Avoid alcohol. Avoid foods that are high in salt, potassium, phosphorous, and protein. Take medicines for  symptoms and to help control other conditions. Have dialysis. This treatment gets harmful waste out of your body. Treat other problems that cause your kidney disease or make it worse. Follow these instructions at home: Medicines Take over-the-counter and prescription medicines only as told by your doctor. Do not take any new medicines, vitamins, or supplements unless your doctor says it is okay. Lifestyle  Do not smoke or use any products that contain nicotine or tobacco. If you need help quitting, ask your doctor. If you drink alcohol: Limit how much you use to: 0-1 drink a day for women who are not pregnant. 0-2 drinks a day for men. Know how much alcohol is in your drink. In the [place] one drink equals one 12 oz bottle of beer (355 mL), one 5 oz glass of wine (148 mL), or one 1 oz glass of hard liquor (44 mL). Stay at a healthy weight. If you need help losing weight, ask your doctor. General instructions  Follow instructions from your doctor about what you cannot eat or drink. Track your blood pressure at home. Tell your doctor about any changes. If you have diabetes, track your blood sugar. Exercise at least 30 minutes a day, 5 days a week. Keep your shots (vaccinations) up to date. Keep all follow-up visits. Where to find more information American Association of Kidney Patients: www.aakp.org National Kidney Foundation: www.kidney.org American Kidney Fund: www.akfinc.org Life Options: www.lifeoptions.org Kidney School: www.kidneyschool.org Contact a doctor if: Your symptoms get worse. You get new symptoms. Get help right away if: You get symptoms of end-stage kidney disease. These   include: Headaches. Losing feeling in your hands or feet. Easy bruising. Having hiccups often. Chest pain. Shortness of breath. Lack of menstrual periods, in women. You have a fever. You make less pee than normal. You have pain or you bleed when you pee or poop. These symptoms may be an  emergency. Get help right away. Call your local emergency services (911 in the [place] Do not wait to see if the symptoms will go away. Do not drive yourself to the hospital. Summary Chronic kidney disease is when lasting damage happens to the kidneys slowly over a long time. Causes of this disease include diabetes and high blood pressure. Often, there is no cure for this disease. Treatment can help symptoms and help keep the disease from getting worse. Treatment may involve lifestyle changes, medicines, and dialysis. This information is not intended to replace advice given to you by your health care provider. Make sure you discuss any questions you have with your health care provider. Document Revised: 11/04/2019 Document Reviewed: 11/04/2019 Elsevier Patient Education  2023 Elsevier Inc.  

## 2022-12-27 NOTE — Telephone Encounter (Signed)
Per Dr. Morene Crocker notes from 12/24/2022 we will await echo which is scheduled 01/17/23.

## 2022-12-27 NOTE — Telephone Encounter (Signed)
Pt states he was advised to f/u with pcp in June for a pre op appt. He stated if there was no availability a message could be sent to the nurse to squeeze him in. Please help pt get scheduled.

## 2022-12-27 NOTE — Progress Notes (Signed)
Subjective:   By signing my name below, I, Robert [name], attest that this documentation has been prepared under the direction and in the presence of Robert Canary, MD., 12/27/2022.   Patient ID: Robert [name], male    DOB: [birth year], 52 y.o.   MRN: [id number]  Chief Complaint  Patient presents with   Follow-up    Follow up   HPI Patient is in today for an office visit.   Plantar Wart on Left Foot:  Patient is requesting a referral to podiatry due to a plantar wart on the left foot, ingrown toenails bilaterally on the great toes, and onychomycosis of the left fourth and great toes. He denies any associated pain of difficulty walking.  Rectal Cancer Patient was diagnosed with rectal cancer on 05/23/2022 after his last colonoscopy revealed 5 polyps, internal hemorrhoids, and a biopsy-proven adenocarcinoma in the proximal rectum. He underwent robotic assisted lower anterior resection, diverting loop ileostomy, and flexible sigmoidoscopy on 08/23/2022 under Dr. Marin Olp which went well. He continues to follow with oncologist Dr. Malachy Mood and has been receiving chemotherapy, which he has been responding to well. He understandably experiences nausea and sickness but has only one session of chemotherapy left. He is  anticipating takedown of the diverting loop ileostomy and flexible sigmoidoscopy in 02/2023 and has received clearance from his cardiologist Dr. 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 He is also anticipating an echocardiogram on 01/17/2023. Today he denies CP/palpitations/SOB/HA/fever/chills/nausea/vomiting.  GERD Patient reports that he has an upper endoscopy scheduled in 02/2023 with Dr. Claudette Head due to his persistent GERD. He states that his GERD is worsening and he is currently managing it with Pantoprazole 40 mg. He has taken Famotidine in the past which was intolerable as well as Esomeprazole which was ineffective. Additionally, he currently takes  Diphenoxylate-atropine 2.5-0.025 mg and Loperamide 2 mg as needed to manage diarrhea. He also continues taking taking Tizanidine 2 mg as needed to manage muscle spasms.  Past Medical History:  Diagnosis Date   Arthralgia 01/11/2013   Diffuse, b/l Hips R>L Knees, ankles, low back   Arthritis    Back pain 12/08/2015   Cancer (HCC)    Coronary artery disease    Diabetes mellitus type 2 in obese 11/09/2012   Dyslipidemia 11/09/2012   Esophageal reflux 12/08/2015   Feeling grief 06/09/2020   History of migraine headaches    Hyperglycemia 11/09/2012   Hypertension     Past Surgical History:  Procedure Laterality Date   DIVERTING ILEOSTOMY N/A 08/23/2022   Procedure: DIVERTING LOOP ILEOSTOMY;  Surgeon: Andria Meuse, MD;  Location: [facility];  Service: General;  Laterality: N/A;   EUS N/A 06/21/2022   Procedure: LOWER ENDOSCOPIC ULTRASOUND (EUS);  Surgeon: Lemar Lofty., MD;  Location: [facility];  Service: Gastroenterology;  Laterality: N/A;   FLEXIBLE SIGMOIDOSCOPY N/A 06/21/2022   Procedure: FLEXIBLE SIGMOIDOSCOPY;  Surgeon: Meridee Score Netty Starring., MD;  Location: [facility];  Service: Gastroenterology;  Laterality: N/A;   FLEXIBLE SIGMOIDOSCOPY N/A 08/23/2022   Procedure: FLEXIBLE SIGMOIDOSCOPY;  Surgeon: Andria Meuse, MD;  Location: [facility];  Service: General;  Laterality: N/A;   PORTACATH PLACEMENT N/A 09/21/2022   Procedure: INSERTION PORT-A-CATH WITH ULTRASOUND GUIDANCE;  Surgeon: Andria Meuse, MD;  Location: [facility];  Service: General;  Laterality: N/A;   XI ROBOTIC ASSISTED LOWER ANTERIOR RESECTION N/A 08/23/2022   Procedure: XI ROBOTIC ASSISTED LOWER ANTERIOR RESECTION with Introperative assessment of profusion and TAP Block;  Surgeon: Andria Meuse, MD;  Location: [facility];  Service: General;  Laterality: N/A;    Family History  Problem Relation Age of Onset   Arthritis Mother    Hypertension Mother    Diabetes Mother    Cancer Mother     Arthritis Father    Hypertension Father    Diabetes Father    Sleep apnea Father    Obesity Father    Arthritis Maternal Grandmother    Diabetes Maternal Grandmother    Stroke Maternal Grandmother    Arthritis Paternal Grandmother    Diabetes Paternal Grandmother    Cancer Paternal Grandmother        lung, smoker   Diabetes Paternal Grandfather    Heart disease Paternal Grandfather 69       MI   Hyperlipidemia Brother    Hypertension Brother    Diabetes Brother    COPD Maternal Grandfather    Kidney disease Neg Hx     Social History   Socioeconomic History   Marital status: Married    Spouse name: Not on file   Number of children: 4   Years of education: Not on file   Highest education level: Bachelor's degree (e.g., BA, AB, BS)  Occupational History   Occupation: Community education officer: [employer]  Tobacco Use   Smoking status: Former    Packs/day: 1.50    Years: 10.00    Additional pack years: 0.00    Total pack years: 15.00    Types: Cigarettes    Quit date: 08/14/1995    Years since quitting: 27.3   Smokeless tobacco: Never  Vaping Use   Vaping Use: Never used  Substance and Sexual Activity   Alcohol use: Yes    Alcohol/week: 6.0 - 10.0 standard drinks of alcohol    Types: 6 - 10 Cans of beer per week    Comment: 2/day, more in the past   Drug use: Yes    Types: Marijuana    Comment: marijuana 12/11   Sexual activity: Yes    Comment: lives with wife and daughter, no dietary restrictions  Other Topics Concern   Not on file  Social History Narrative   Regular exercise:  Walks 1-2 x weekly   Caffeine use:  3-4 daily   4 children- oldest first marriage 80 (son), 58 son, 10 son, 96 yr old girl   Married   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[employer] Strain: Low Risk  (12/23/2022)   Overall Financial Resource Strain (CARDIA)    Difficulty of Paying Living Expenses: Not very  hard  Food Insecurity: No Food Insecurity (12/23/2022)   Hunger Vital Sign    Worried About Running Out of Food in the Last Year: Never true    Ran Out of Food in the Last Year: Never true  Transportation Needs: No Transportation Needs (12/23/2022)   PRAPARE - Administrator, Civil Service (Medical): No    Lack of Transportation (Non-Medical): No  Physical Activity: Unknown (12/23/2022)   Exercise Vital Sign    Days of Exercise per Week: 5 days    Minutes of Exercise per Session: Patient declined  Stress: No Stress Concern Present (12/23/2022)   [employer] of Occupational Health - Occupational Stress Questionnaire    Feeling of Stress : Only a little  Social Connections: Moderately Integrated (12/23/2022)   Social Connection  and Isolation Panel [NHANES]    Frequency of Communication with Friends and Family: Three times a week    Frequency of Social Gatherings with Friends and Family: Patient declined    Attends Religious Services: More than 4 times per year    Active Member of [employer] or Organizations: No    Attends Engineer, structural: Not on file    Marital Status: Married  Catering manager Violence: Not At Risk (12/04/2022)   Humiliation, Afraid, Rape, and Kick questionnaire    Fear of Current or Ex-Partner: No    Emotionally Abused: No    Physically Abused: No    Sexually Abused: No    Outpatient Medications Prior to Visit  Medication Sig Dispense Refill   amLODipine (NORVASC) 5 MG tablet Take 5 mg by mouth daily as needed (for a systolic number reading greater than 150 or as otherwise directed for elevated B/P).     carvedilol (COREG) 25 MG tablet Take 1 tablet (25 mg total) by mouth 2 (two) times daily. 180 tablet 3   diphenoxylate-atropine (LOMOTIL) 2.5-0.025 MG tablet Take 1 tablet by mouth 4 (four) times daily for 21 days. 84 tablet 0   glucose blood (COOL BLOOD GLUCOSE TEST STRIPS) test strip Check blood sugar once daily 100 each 12    lidocaine-prilocaine (EMLA) cream Apply 1 Application topically as needed (for port access).     loperamide (IMODIUM) 2 MG capsule Take 2 capsules (4 mg total) by mouth 4 (four) times daily for 21 days. 168 capsule 0   metFORMIN (GLUCOPHAGE) 500 MG tablet Take 1 tablet (500 mg total) by mouth daily with breakfast. 90 tablet 1   ondansetron (ZOFRAN) 8 MG tablet Take 8 mg by mouth every 6 (six) hours as needed for nausea or vomiting.     pantoprazole (PROTONIX) 40 MG tablet Take 1 tablet (40 mg total) by mouth daily. 90 tablet 1   polycarbophil (FIBERCON) 625 MG tablet Take 1 tablet (625 mg total) by mouth daily. 30 tablet 2   prochlorperazine (COMPAZINE) 10 MG tablet Take 10 mg by mouth every 6 (six) hours as needed for nausea or vomiting.     sitaGLIPtin (JANUVIA) 100 MG tablet Take 1 tablet (100 mg total) by mouth daily. 30 tablet 4   tiZANidine (ZANAFLEX) 2 MG tablet Take 1-2 tablets (2-4 mg total) by mouth 3 (three) times daily as needed for muscle spasms. 60 tablet 2   No facility-administered medications prior to visit.    Allergies  Allergen Reactions   Advil [Ibuprofen] Shortness Of Breath   Aleve [Naproxen] Shortness Of Breath   Mucinex [Guaifenesin Er] Shortness Of Breath, Itching, Swelling, Dermatitis, Rash and Other (See Comments)    Swelling of hands and feet   Nsaids Shortness Of Breath, Itching, Swelling, Rash and Other (See Comments)    Aleve and Advil    Levaquin [Levofloxacin]     Aortic aneurysm; not recommend in this condition unless no other agents would cover   Latex Rash and Other (See Comments)    "Cartilage will harden"    Review of Systems  Constitutional:  Negative for chills and fever.  Respiratory:  Negative for shortness of breath.   Cardiovascular:  Negative for chest pain and palpitations.  Gastrointestinal:  Positive for diarrhea and heartburn. Negative for nausea and vomiting.  Genitourinary:  Negative for dysuria, frequency, hematuria and urgency.   Skin:        (+) plantar wart on left foot. (+) ingrown toenails bilaterally on  great toes. (+) onychomycosis on left fourth and great toes.  Neurological:  Negative for headaches.       Objective:    Physical Exam Constitutional:      General: He is not in acute distress.    Appearance: Normal appearance. He is not ill-appearing.  HENT:     Head: Normocephalic and atraumatic.     Right Ear: External ear normal.     Left Ear: External ear normal.     Nose: Nose normal.     Mouth/Throat:     Mouth: Mucous membranes are moist.     Pharynx: Oropharynx is clear.  Eyes:     General:        Right eye: No discharge.        Left eye: No discharge.     Extraocular Movements: Extraocular movements intact.     Conjunctiva/sclera: Conjunctivae normal.     Pupils: Pupils are equal, round, and reactive to light.  Cardiovascular:     Rate and Rhythm: Normal rate and regular rhythm.     Pulses: Normal pulses.     Heart sounds: Normal heart sounds. No murmur heard.    No gallop.  Pulmonary:     Effort: Pulmonary effort is normal. No respiratory distress.     Breath sounds: Normal breath sounds. No wheezing or rales.  Abdominal:     General: Bowel sounds are normal.     Palpations: Abdomen is soft.     Tenderness: There is no abdominal tenderness. There is no guarding.  Musculoskeletal:        General: Normal range of motion.     Cervical back: Normal range of motion.     Right lower leg: No edema.     Left lower leg: No edema.  Skin:    General: Skin is warm and dry.  Neurological:     Mental Status: He is alert and oriented to person, place, and time.  Psychiatric:        Mood and Affect: Mood normal.        Behavior: Behavior normal.        Judgment: Judgment normal.     BP 130/78 (BP Location: Right Arm, Patient Position: Sitting, Cuff Size: Normal)   Pulse 65   Temp 98 F (36.7 C) (Oral)   Resp 16   Ht 6\' 3"  (1.905 m)   Wt 255 lb (115.7 kg)   SpO2 96%   BMI 31.87  kg/m  Wt Readings from Last 3 Encounters:  12/27/22 255 lb (115.7 kg)  12/24/22 260 lb (117.9 kg)  12/20/22 253 lb 14.4 oz (115.2 kg)    Diabetic Foot Exam - Simple   No data filed    Lab Results  Component Value Date   WBC 6.8 12/20/2022   HGB 12.0 (L) 12/20/2022   HCT 36.2 (L) 12/20/2022   PLT 219 12/20/2022   GLUCOSE 147 (H) 12/20/2022   CHOL 120 09/29/2022   TRIG 107 09/29/2022   HDL 27 (L) 09/29/2022   LDLCALC 72 09/29/2022   ALT 19 12/20/2022   AST 25 12/20/2022   NA 138 12/20/2022   K 4.2 12/20/2022   CL 104 12/20/2022   CREATININE 1.57 (H) 12/20/2022   BUN 13 12/20/2022   CO2 26 12/20/2022   TSH 0.69 06/19/2022   PSA 0.35 11/03/2021   HGBA1C 7.2 (H) 09/17/2022   MICROALBUR 0.8 11/03/2021    Lab Results  Component Value Date   TSH 0.69 06/19/2022  Lab Results  Component Value Date   WBC 6.8 12/20/2022   HGB 12.0 (L) 12/20/2022   HCT 36.2 (L) 12/20/2022   MCV 85.2 12/20/2022   PLT 219 12/20/2022   Lab Results  Component Value Date   NA 138 12/20/2022   K 4.2 12/20/2022   CO2 26 12/20/2022   GLUCOSE 147 (H) 12/20/2022   BUN 13 12/20/2022   CREATININE 1.57 (H) 12/20/2022   BILITOT 0.4 12/20/2022   ALKPHOS 75 12/20/2022   AST 25 12/20/2022   ALT 19 12/20/2022   PROT 7.4 12/20/2022   ALBUMIN 3.9 12/20/2022   CALCIUM 9.5 12/20/2022   ANIONGAP 8 12/20/2022   GFR 79.33 06/19/2022   Lab Results  Component Value Date   CHOL 120 09/29/2022   Lab Results  Component Value Date   HDL 27 (L) 09/29/2022   Lab Results  Component Value Date   LDLCALC 72 09/29/2022   Lab Results  Component Value Date   TRIG 107 09/29/2022   Lab Results  Component Value Date   CHOLHDL 4.4 09/29/2022   Lab Results  Component Value Date   HGBA1C 7.2 (H) 09/17/2022      Assessment & Plan:  Plantar Wart on Left Foot: Referral placed to [facility] to manage plantar wart on left foot, ingrown toenails bilaterally on great toes, and  onychomycosis on left fourth and great toes.  Rectal Cancer: Patient has an upcoming ileostomy in 02/2023 and continues to follow with oncologist Dr. Malachy Mood. He has one session of chemotherapy left and continues taking Diphenoxylate-atropine 2.5-0.025 mg and Loperamide 2 mg as needed to manage diarrhea. He also continues taking Tizanidine 2 mg to manage muscle spasms.  GERD: Patient has upcoming upper endoscopy in 02/2023 with Dr. Claudette Head. He continues taking Pantoprazole 40 mg daily which has been tolerable.  Problem List Items Addressed This Visit     Plantar wart of left foot   Relevant Orders   Ambulatory referral to Podiatry   HTN (hypertension) - Primary    Well controlled, no changes to meds. Encouraged heart healthy diet such as the DASH diet and exercise as tolerated.        Hyperlipidemia LDL goal <70    Cardiology has recommended a LDL goal of <70 and patient is agreeable but they have recommended we institute therapy after his cancer surgery so we will hold off on adding Atorvastatin 10 mg tabs, 1 daily until after surgery. Maintain heart healthy diet such as Mediterranean, MIND or DASH diet, increase exercise, avoid trans fats, simple carbohydrates and processed foods, consider a krill or fish or flaxseed oil cap daily.        Ileostomy in place [facility])    They are planning reversal this summer      Type 2 diabetes mellitus with obesity (HCC)    Drop Metformin to 500 mg daily and add Januvia If sugar does not improve can consier Ozempic or Mounjaro in the future. . minimize simple carbs. Increase exercise as tolerated.      Ileostomy status (HCC)    Is hoping to have his iliostomy tube removed in July. He is medically stable today and if he has no clinical change in his status he can proceed with surgery. He is given an appt for the second half of June in case they decide he needs a new surgical clearance      Ingrown toenail of both feet    With plantar wart and  onychomycosis. Will refer to [facility] for evaluation and treatment      Relevant Orders   Ambulatory referral to Podiatry   Onychomycosis   Relevant Orders   Ambulatory referral to Podiatry   No orders of the defined types were placed in this encounter.  I, Danise Edge, MD, personally preformed the services described in this documentation.  All medical record entries made by the scribe were at my direction and in my presence.  I have reviewed the chart and discharge instructions (if applicable) and agree that the record reflects my personal performance and is accurate and complete. 12/27/2022  I,Mohammed Iqbal,acting as a scribe for Danise Edge, MD.,have documented all relevant documentation on the behalf of Danise Edge, MD,as directed by  Danise Edge, MD while in the presence of Danise Edge, MD.  Danise Edge, MD

## 2022-12-27 NOTE — Assessment & Plan Note (Signed)
With plantar wart and onychomycosis. Will refer to [facility] for evaluation and treatment

## 2022-12-27 NOTE — Assessment & Plan Note (Signed)
Is hoping to have his iliostomy tube removed in July. He is medically stable today and if he has no clinical change in his status he can proceed with surgery. He is given an appt for the second half of June in case they decide he needs a new surgical clearance

## 2022-12-28 NOTE — Telephone Encounter (Signed)
Called pt lvm and sent mychart message that Dr.Blyth has appt on 01/14/23 At 3:40 for Pre OP if it will work to let us know.

## 2023-01-01 NOTE — Progress Notes (Unsigned)
[facility]   [phone] [phone]   Clinic Follow up Note   Patient Care Team: Bradd Canary, MD as PCP - General (Family Medicine) Christell Constant, MD as PCP - Cardiology (Cardiology) Malachy Mood, MD as Consulting Physician (Oncology) Pollyann Samples, NP as Nurse Practitioner (Oncology) Marily Lente, RN as Oncology Nurse Navigator (Oncology)  Date of Service:  01/02/2023  CHIEF COMPLAINT: f/u of rectal cancer   CURRENT THERAPY:    Adjuvant chemo FOLFOX x3 months starting 10/10/22   ASSESSMENT: Robert [name] is a 52 y.o. male with   Rectal cancer (HCC) He presented with 1-1.5 years of incomplete bowel emptying and intermittent rectal bleeding, colonoscopy showed 5 polyps, internal hemorrhoids, and a biopsy-proven adenocarcinoma in the proximal rectum -Baseline CEA is normal. CT negative for distant metastasis -local staging MRI showed T1/T2, N0 rectal cancer and likely reactive inguinal nodes.  -rectal EUS showed uT2, uN0 disease, case reviewed in multidisciplinary conference and the recommendation was for upfront surgery -Proceeded with LAR and diverting ileostomy with Dr. Cliffton Asters on 08/23/2022. I reviewed surgical path which showed invasive grade 2 adenocarcinoma spanning 2.5 cm involving the rectum with carcinoma invading focally into the perirectal adipose tissue.  0/14 lymph nodes involved with clear margins and no LVI. Stage pT3 pN0, IIA, with no high risk features, MMR normal and MSI stable  -Case again discussed in conference and the recommendation is for 3 months adjuvant chemo. Dr. Mitzi Hansen felt that he could forego radiation -Began adjuvant FOLFOX 10/10/2022, s/p 4 cycles last given 11/21/22, cycle 5 held due to diarrhea and AKI which required hospital admission. -he tolerated cycle 5 chemotherapy overall well -Lab reviewed, adequate for treatment, will proceed with last cycle FOLFOX today     PLAN: -lab reviewed -Proceed last  cycle of FOLFOX today with same reduce dose -I will reach out to Dr.White for port removal during his colostomy reversal  -lab/flush and f/u 02/2023   SUMMARY OF ONCOLOGIC HISTORY: Oncology History Overview Note   Cancer Staging  Rectal cancer [facility]) Staging form: Colon and Rectum, AJCC 8th Edition - Pathologic stage from 08/23/2022: Stage IIA (pT3, pN0, cM0) - Signed by Pollyann Samples, NP on 09/10/2022 Stage prefix: Initial diagnosis Total positive nodes: 0     Rectal cancer (HCC)  05/09/2022 Procedure   Colonoscopy by Dr. Russella Dar, impression - Five 5 to 7 mm polyps in the rectum, in the transverse colon and at the hepatic flexure, removed with a cold snare. Resected and retrieved. removed with a cold snare. Resected and retrieved. 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 Submucosal injection tattoo. - Internal hemorrhoids. - The examination was otherwise normal on direct and retroflexion views.   05/09/2022 Initial Biopsy   Diagnosis 1. Surgical [P], colon, transverse x1 and hepatic flexure x 3 (2 pieces only, 2 stuck together taken off together), polyp (4) - TUBULAR ADENOMA(S) - NEGATIVE FOR HIGH-GRADE DYSPLASIA OR MALIGNANCY 2. Surgical [P], colon, rectal mass - INVASIVE MODERATELY DIFFERENTIATED ADENOCARCINOMA. SEE NOTE 3. Surgical [P], colon, rectum, polyp (1) - HYPERPLASTIC POLYP   05/09/2022 Tumor Marker   CEA < 2.0   05/17/2022 Imaging   IMPRESSION: 1. Known primary malignancy is not well visualized. Correlate with recent colonoscopy. 2. No evidence of metastatic disease in the chest, abdomen or pelvis. 3. Mildly enlarged bilateral inguinal lymph nodes, likely reactive. 4. 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 Recommend annual imaging followup by CTA or MRA. This recommendation follows  2010 ACCF/AHA/AATS/ACR/ASA/SCA/SCAI/SIR/STS/SVM Guidelines for the Diagnosis and Management of Patients with Thoracic Aortic Disease. Circulation. 2010; 121:  A213-Y865. 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 Severe coronary artery calcifications of the LAD and circumflex. 6. Aortic Atherosclerosis (ICD10-I70.0).   05/23/2022 Initial Diagnosis   Rectal cancer (HCC)   08/23/2022 Cancer Staging   Staging form: Colon and Rectum, AJCC 8th Edition - Pathologic stage from 08/23/2022: Stage IIA (pT3, pN0, cM0) - Signed by Pollyann Samples, NP on 09/10/2022 Stage prefix: Initial diagnosis Total positive nodes: 0   08/23/2022 Surgery   PROCEDURE:  Robotic assisted low anterior resection with diverting loop ileostomy SURGEON: Stephanie Coup. Cliffton Asters, MD ASSISTANT: Romie Levee, MD   08/23/2022 Pathology Results   FINAL MICROSCOPIC DIAGNOSIS:  A. COLON, RECTOSIGMOID, RESECTION:  - Invasive moderately differentiated adenocarcinoma, 2.5 cm, involving rectum  - Carcinoma invades focally into the perirectal adipose tissue  - Resection margins are negative for carcinoma  - Negative for lymphovascular invasion  - Fourteen lymph nodes, negative for carcinoma (0/14)  - See oncology table  B. DISTAL MARGIN DONUT:  - Rectal donut, negative for carcinoma   Regional Lymph Nodes:       Number of Lymph Nodes with Tumor: 0       Number of Lymph Nodes Examined: 14  Pathologic Stage Classification (pTNM, AJCC 8th Edition): pT3, pN0  Mismatch Repair Protein (IHC)  SUMMARY INTERPRETATION: NORMAL  MSI-Stable   09/27/2022 - 09/27/2022 Chemotherapy   Patient is on Treatment Plan : RECTAL Xelox (Capeox) (130/850) q21d x 6 cycles     10/10/2022 -  Chemotherapy   Patient is on Treatment Plan : COLORECTAL FOLFOX q14d x 3 months        INTERVAL HISTORY:  Robert [name] is here for a follow up of rectal cancer. He was last seen by me on 12/20/2022. He presents to the clinic alone. Pt state that his Bm is more runny in the morning and he takes Imodium He has some numbness and in his feet that's intermittent and its not bad,but his hands have been better.    All other systems  were reviewed with the patient and are negative.  MEDICAL HISTORY:  Past Medical History:  Diagnosis Date   Arthralgia 01/11/2013   Diffuse, b/l Hips R>L Knees, ankles, low back   Arthritis    Back pain 12/08/2015   Cancer (HCC)    Coronary artery disease    Diabetes mellitus type 2 in obese 11/09/2012   Dyslipidemia 11/09/2012   Esophageal reflux 12/08/2015   Feeling grief 06/09/2020   History of migraine headaches    Hyperglycemia 11/09/2012   Hypertension     SURGICAL HISTORY: Past Surgical History:  Procedure Laterality Date   DIVERTING ILEOSTOMY N/A 08/23/2022   Procedure: DIVERTING LOOP ILEOSTOMY;  Surgeon: Andria Meuse, MD;  Location: [facility];  Service: General;  Laterality: N/A;   EUS N/A 06/21/2022   Procedure: LOWER ENDOSCOPIC ULTRASOUND (EUS);  Surgeon: Lemar Lofty., MD;  Location: [facility];  Service: Gastroenterology;  Laterality: N/A;   FLEXIBLE SIGMOIDOSCOPY N/A 06/21/2022   Procedure: FLEXIBLE SIGMOIDOSCOPY;  Surgeon: Meridee Score Netty Starring., MD;  Location: [facility];  Service: Gastroenterology;  Laterality: N/A;   FLEXIBLE SIGMOIDOSCOPY N/A 08/23/2022   Procedure: FLEXIBLE SIGMOIDOSCOPY;  Surgeon: Andria Meuse, MD;  Location: [facility];  Service: General;  Laterality: N/A;   PORTACATH PLACEMENT N/A 09/21/2022   Procedure: INSERTION PORT-A-CATH WITH ULTRASOUND GUIDANCE;  Surgeon: Andria Meuse, MD;  Location: [facility];  Service: General;  Laterality: N/A;   XI ROBOTIC ASSISTED LOWER ANTERIOR RESECTION N/A 08/23/2022   Procedure: XI ROBOTIC ASSISTED LOWER ANTERIOR RESECTION with Introperative assessment of profusion and TAP Block;  Surgeon: Andria Meuse, MD;  Location: [facility];  Service: General;  Laterality: N/A;    I have reviewed the social history and family history with the patient and they are unchanged from previous note.  ALLERGIES:  is allergic to advil [ibuprofen], aleve [naproxen], mucinex [guaifenesin er], nsaids,  levaquin [levofloxacin], and latex.  MEDICATIONS:  Current Outpatient Medications  Medication Sig Dispense Refill   amLODipine (NORVASC) 5 MG tablet Take 5 mg by mouth daily as needed (for a systolic number reading greater than 150 or as otherwise directed for elevated B/P).     carvedilol (COREG) 25 MG tablet Take 1 tablet (25 mg total) by mouth 2 (two) times daily. 180 tablet 3   glucose blood (COOL BLOOD GLUCOSE TEST STRIPS) test strip Check blood sugar once daily 100 each 12   lidocaine-prilocaine (EMLA) cream Apply 1 Application topically as needed (for port access).     metFORMIN (GLUCOPHAGE) 500 MG tablet Take 1 tablet (500 mg total) by mouth daily with breakfast. 90 tablet 1   ondansetron (ZOFRAN) 8 MG tablet Take 8 mg by mouth every 6 (six) hours as needed for nausea or vomiting.     pantoprazole (PROTONIX) 40 MG tablet Take 1 tablet (40 mg total) by mouth daily. 90 tablet 1   polycarbophil (FIBERCON) 625 MG tablet Take 1 tablet (625 mg total) by mouth daily. 30 tablet 2   prochlorperazine (COMPAZINE) 10 MG tablet Take 10 mg by mouth every 6 (six) hours as needed for nausea or vomiting.     sitaGLIPtin (JANUVIA) 100 MG tablet Take 1 tablet (100 mg total) by mouth daily. 30 tablet 4   tiZANidine (ZANAFLEX) 2 MG tablet Take 1-2 tablets (2-4 mg total) by mouth 3 (three) times daily as needed for muscle spasms. 60 tablet 2   No current facility-administered medications for this visit.    PHYSICAL EXAMINATION: ECOG PERFORMANCE STATUS: 1 - Symptomatic but completely ambulatory  Vitals:   01/02/23 1030  BP: 121/78  Pulse: 64  Resp: 18  Temp: 98.6 F (37 C)  SpO2: 95%   Wt Readings from Last 3 Encounters:  01/02/23 254 lb 4.8 oz (115.3 kg)  12/27/22 255 lb (115.7 kg)  12/24/22 260 lb (117.9 kg)     GENERAL:alert, no distress and comfortable SKIN: skin color normal, no rashes or significant lesions EYES: normal, Conjunctiva are pink and non-injected, sclera clear  NEURO:  alert & oriented x 3 with fluent speech   LABORATORY DATA:  I have reviewed the data as listed    Latest Ref Rng & Units 01/02/2023   10:04 AM 12/20/2022    9:50 AM 12/05/2022    8:34 AM  CBC  WBC 4.0 - 10.5 K/uL 5.7  6.8  5.5   Hemoglobin 13.0 - 17.0 g/dL 16.1  09.6  04.5   Hematocrit 39.0 - 52.0 % 36.3  36.2  35.2   Platelets 150 - 400 K/uL 130  219  102         Latest Ref Rng & Units 01/02/2023   10:04 AM 12/20/2022    9:50 AM 12/10/2022    2:00 AM  CMP  Glucose 70 - 99 mg/dL 409  811  914   BUN 6 - 20 mg/dL 19  13  14    Creatinine 0.61 - 1.24 mg/dL  1.39  1.57  1.34   Sodium 135 - 145 mmol/L 139  138  133   Potassium 3.5 - 5.1 mmol/L 4.1  4.2  4.1   Chloride 98 - 111 mmol/L 103  104  104   CO2 22 - 32 mmol/L 29  26  21    Calcium 8.9 - 10.3 mg/dL 9.5  9.5  8.9   Total Protein 6.5 - 8.1 g/dL 7.2  7.4    Total Bilirubin 0.3 - 1.2 mg/dL 0.5  0.4    Alkaline Phos 38 - 126 U/L 69  75    AST 15 - 41 U/L 25  25    ALT 0 - 44 U/L 19  19        RADIOGRAPHIC STUDIES: I have personally reviewed the radiological images as listed and agreed with the findings in the report. No results found.    No orders of the defined types were placed in this encounter.  All questions were answered. The patient knows to call the clinic with any problems, questions or concerns. No barriers to learning was detected. The total time spent in the appointment was 25 minutes.     Malachy Mood, MD 01/02/2023   Carolin Coy, CMA, am acting as scribe for Malachy Mood, MD.   I have reviewed the above documentation for accuracy and completeness, and I agree with the above.

## 2023-01-01 NOTE — Assessment & Plan Note (Signed)
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 CT negative for distant metastasis -local staging MRI showed T1/T2, N0 rectal cancer and likely reactive inguinal nodes.  -rectal EUS showed uT2, uN0 disease, case reviewed in multidisciplinary conference and the recommendation was for upfront surgery -Proceeded with LAR and diverting ileostomy with Dr. Cliffton Asters on 08/23/2022. I reviewed surgical path which showed invasive grade 2 adenocarcinoma spanning 2.5 cm involving the rectum with carcinoma invading focally into the perirectal adipose tissue.  0/14 lymph nodes involved with clear margins and no LVI. Stage pT3 pN0, IIA, with no high risk features, MMR normal and MSI stable  -Case again discussed in conference and the recommendation is for 3 months adjuvant chemo. Dr. Mitzi Hansen felt that he could forego radiation -Began adjuvant FOLFOX 10/10/2022, s/p 4 cycles last given 11/21/22, cycle 5 held due to diarrhea and AKI which required hospital admission. -he tolerated cycle 5 chemotherapy overall well -Lab reviewed, adequate for treatment, will proceed with last cycle FOLFOX today

## 2023-01-02 ENCOUNTER — Inpatient Hospital Stay: Payer: BLUE CROSS/BLUE SHIELD

## 2023-01-02 ENCOUNTER — Inpatient Hospital Stay (HOSPITAL_BASED_OUTPATIENT_CLINIC_OR_DEPARTMENT_OTHER): Payer: BLUE CROSS/BLUE SHIELD | Admitting: Hematology

## 2023-01-02 ENCOUNTER — Encounter: Payer: Self-pay | Admitting: Hematology

## 2023-01-02 VITALS — BP 121/78 | HR 64 | Temp 98.6°F | Resp 18 | Ht 75.0 in | Wt 254.3 lb

## 2023-01-02 DIAGNOSIS — C20 Malignant neoplasm of rectum: Secondary | ICD-10-CM

## 2023-01-02 DIAGNOSIS — Z5111 Encounter for antineoplastic chemotherapy: Secondary | ICD-10-CM | POA: Diagnosis not present

## 2023-01-02 DIAGNOSIS — Z95828 Presence of other vascular implants and grafts: Secondary | ICD-10-CM

## 2023-01-02 LAB — CBC WITH DIFFERENTIAL (CANCER CENTER ONLY)
Abs Immature Granulocytes: 0.01 10*3/uL (ref 0.00–0.07)
Basophils Absolute: 0 10*3/uL (ref 0.0–0.1)
Basophils Relative: 1 %
Eosinophils Absolute: 0.2 10*3/uL (ref 0.0–0.5)
Eosinophils Relative: 3 %
HCT: 36.3 % — ABNORMAL LOW (ref 39.0–52.0)
Hemoglobin: 11.8 g/dL — ABNORMAL LOW (ref 13.0–17.0)
Immature Granulocytes: 0 %
Lymphocytes Relative: 22 %
Lymphs Abs: 1.2 10*3/uL (ref 0.7–4.0)
MCH: 28.2 pg (ref 26.0–34.0)
MCHC: 32.5 g/dL (ref 30.0–36.0)
MCV: 86.6 fL (ref 80.0–100.0)
Monocytes Absolute: 0.5 10*3/uL (ref 0.1–1.0)
Monocytes Relative: 10 %
Neutro Abs: 3.7 10*3/uL (ref 1.7–7.7)
Neutrophils Relative %: 64 %
Platelet Count: 130 10*3/uL — ABNORMAL LOW (ref 150–400)
RBC: 4.19 MIL/uL — ABNORMAL LOW (ref 4.22–5.81)
RDW: 19.2 % — ABNORMAL HIGH (ref 11.5–15.5)
WBC Count: 5.7 10*3/uL (ref 4.0–10.5)
nRBC: 0 % (ref 0.0–0.2)

## 2023-01-02 LAB — CMP (CANCER CENTER ONLY)
ALT: 19 U/L (ref 0–44)
AST: 25 U/L (ref 15–41)
Albumin: 4.1 g/dL (ref 3.5–5.0)
Alkaline Phosphatase: 69 U/L (ref 38–126)
Anion gap: 7 (ref 5–15)
BUN: 19 mg/dL (ref 6–20)
CO2: 29 mmol/L (ref 22–32)
Calcium: 9.5 mg/dL (ref 8.9–10.3)
Chloride: 103 mmol/L (ref 98–111)
Creatinine: 1.39 mg/dL — ABNORMAL HIGH (ref 0.61–1.24)
GFR, Estimated: >60 mL/min (ref >60)
Glucose, Bld: 175 mg/dL — ABNORMAL HIGH (ref 70–99)
Potassium: 4.1 mmol/L (ref 3.5–5.1)
Sodium: 139 mmol/L (ref 135–145)
Total Bilirubin: 0.5 mg/dL (ref 0.3–1.2)
Total Protein: 7.2 g/dL (ref 6.5–8.1)

## 2023-01-02 MED ORDER — DEXTROSE 5 % IV SOLN: INTRAVENOUS | Status: DC

## 2023-01-02 MED ORDER — PALONOSETRON HCL INJECTION 0.25 MG/5ML
0.2500 mg | Freq: Once | INTRAVENOUS | Status: AC
  Administered 2023-01-02: 0.25 mg via INTRAVENOUS
  Filled 2023-01-02: qty 5

## 2023-01-02 MED ORDER — LEUCOVORIN CALCIUM INJECTION 350 MG
400.0000 mg/m2 | Freq: Once | INTRAVENOUS | Status: AC
  Administered 2023-01-02: 1016 mg via INTRAVENOUS
  Filled 2023-01-02: qty 50.8

## 2023-01-02 MED ORDER — SODIUM CHLORIDE 0.9% FLUSH
10.0000 mL | Freq: Once | INTRAVENOUS | Status: AC
  Administered 2023-01-02: 10 mL

## 2023-01-02 MED ORDER — SODIUM CHLORIDE 0.9 % IV SOLN: Freq: Once | INTRAVENOUS | Status: DC

## 2023-01-02 MED ORDER — SODIUM CHLORIDE 0.9 % IV SOLN
10.0000 mg | Freq: Once | INTRAVENOUS | Status: AC
  Administered 2023-01-02: 10 mg via INTRAVENOUS
  Filled 2023-01-02: qty 10

## 2023-01-02 MED ORDER — OXALIPLATIN CHEMO INJECTION 100 MG/20ML
85.0000 mg/m2 | Freq: Once | INTRAVENOUS | Status: AC
  Administered 2023-01-02: 200 mg via INTRAVENOUS
  Filled 2023-01-02: qty 40

## 2023-01-02 MED ORDER — SODIUM CHLORIDE 0.9 % IV SOLN
2000.0000 mg/m2 | INTRAVENOUS | Status: DC
  Administered 2023-01-02: 5000 mg via INTRAVENOUS
  Filled 2023-01-02: qty 100

## 2023-01-02 NOTE — Patient Instructions (Signed)
[facility]  Discharge Instructions: Thank you for choosing [facility] to provide your oncology and hematology care.   If you have a lab appointment with the Cancer Center, please go directly to the Cancer Center and check in at the registration area.   Wear comfortable clothing and clothing appropriate for easy access to any Portacath or PICC line.   We strive to give you quality time with your provider. You may need to reschedule your appointment if you arrive late (15 or more minutes).  Arriving late affects you and other patients whose appointments are after yours.  Also, if you miss three or more appointments without notifying the office, you may be dismissed from the clinic at the provider's discretion.      For prescription refill requests, have your pharmacy contact our office and allow 72 hours for refills to be completed.    Today you received the following chemotherapy and/or immunotherapy agents: Oxaliplatin, Leucovorin, 5FU      To help prevent nausea and vomiting after your treatment, we encourage you to take your nausea medication as directed.  BELOW ARE SYMPTOMS THAT SHOULD BE REPORTED IMMEDIATELY: *FEVER GREATER THAN 100.4 F (38 C) OR HIGHER *CHILLS OR SWEATING *NAUSEA AND VOMITING THAT IS NOT CONTROLLED WITH YOUR NAUSEA MEDICATION *UNUSUAL SHORTNESS OF BREATH *UNUSUAL BRUISING OR BLEEDING *URINARY PROBLEMS (pain or burning when urinating, or frequent urination) *BOWEL PROBLEMS (unusual diarrhea, constipation, pain near the anus) TENDERNESS IN MOUTH AND THROAT WITH OR WITHOUT PRESENCE OF ULCERS (sore throat, sores in mouth, or a toothache) UNUSUAL RASH, SWELLING OR PAIN  UNUSUAL VAGINAL DISCHARGE OR ITCHING   Items with * indicate a potential emergency and should be followed up as soon as possible or go to the Emergency Department if any problems should occur.  Please show the CHEMOTHERAPY ALERT CARD or IMMUNOTHERAPY  ALERT CARD at check-in to the Emergency Department and triage nurse.  Should you have questions after your visit or need to cancel or reschedule your appointment, please contact [facility]  Dept: [phone]  and follow the prompts.  Office hours are 8:00 a.m. to 4:30 p.m. Monday - Friday. Please note that voicemails left after 4:00 p.m. may not be returned until the following business day.  We are closed weekends and major holidays. You have access to a nurse at all times for urgent questions. Please call the main number to the clinic Dept: [phone] and follow the prompts.   For any non-urgent questions, you may also contact your provider using MyChart. We now offer e-Visits for anyone 18 and older to request care online for non-urgent symptoms. For details visit [email or web address].   Also download the MyChart app! Go to the app store, search "MyChart", open the app, select [facility], and log in with your MyChart username and password.   

## 2023-01-03 NOTE — Telephone Encounter (Signed)
Called pt appt changed and new appt as made for 02/05/23

## 2023-01-04 ENCOUNTER — Other Ambulatory Visit (HOSPITAL_COMMUNITY): Payer: BLUE CROSS/BLUE SHIELD

## 2023-01-04 ENCOUNTER — Inpatient Hospital Stay: Payer: BLUE CROSS/BLUE SHIELD

## 2023-01-04 VITALS — BP 117/71 | HR 58 | Temp 98.5°F | Resp 18

## 2023-01-04 DIAGNOSIS — C20 Malignant neoplasm of rectum: Secondary | ICD-10-CM

## 2023-01-04 DIAGNOSIS — Z5111 Encounter for antineoplastic chemotherapy: Secondary | ICD-10-CM | POA: Diagnosis not present

## 2023-01-04 MED ORDER — HEPARIN SOD (PORK) LOCK FLUSH 100 UNIT/ML IV SOLN
500.0000 [IU] | Freq: Once | INTRAVENOUS | Status: AC | PRN
  Administered 2023-01-04: 500 [IU]

## 2023-01-04 MED ORDER — SODIUM CHLORIDE 0.9% FLUSH
10.0000 mL | INTRAVENOUS | Status: DC | PRN
  Administered 2023-01-04: 10 mL

## 2023-01-11 ENCOUNTER — Ambulatory Visit (HOSPITAL_COMMUNITY)
Admission: RE | Admit: 2023-01-11 | Discharge: 2023-01-11 | Disposition: A | Discharge status: Home / Self Care | Payer: BLUE CROSS/BLUE SHIELD | Source: Ambulatory Visit | Attending: Surgery | Admitting: Surgery

## 2023-01-11 DIAGNOSIS — I7121 Aneurysm of the ascending aorta, without rupture: Secondary | ICD-10-CM | POA: Diagnosis present

## 2023-01-11 MED ORDER — IOHEXOL 350 MG/ML SOLN
75.0000 mL | Freq: Once | INTRAVENOUS | Status: AC | PRN
  Administered 2023-01-11: 75 mL via INTRAVENOUS

## 2023-01-14 ENCOUNTER — Ambulatory Visit: Payer: BLUE CROSS/BLUE SHIELD | Admitting: Family Medicine

## 2023-01-17 ENCOUNTER — Ambulatory Visit (HOSPITAL_COMMUNITY)
Admission: RE | Admit: 2023-01-17 | Discharge: 2023-01-17 | Disposition: A | Discharge status: Home / Self Care | Payer: BLUE CROSS/BLUE SHIELD | Source: Ambulatory Visit | Attending: Surgery | Admitting: Surgery

## 2023-01-17 DIAGNOSIS — I10 Essential (primary) hypertension: Secondary | ICD-10-CM | POA: Insufficient documentation

## 2023-01-17 DIAGNOSIS — I7121 Aneurysm of the ascending aorta, without rupture: Secondary | ICD-10-CM | POA: Diagnosis not present

## 2023-01-17 DIAGNOSIS — I2510 Atherosclerotic heart disease of native coronary artery without angina pectoris: Secondary | ICD-10-CM | POA: Diagnosis not present

## 2023-01-17 DIAGNOSIS — E119 Type 2 diabetes mellitus without complications: Secondary | ICD-10-CM | POA: Diagnosis not present

## 2023-01-17 DIAGNOSIS — I359 Nonrheumatic aortic valve disorder, unspecified: Secondary | ICD-10-CM | POA: Insufficient documentation

## 2023-01-17 DIAGNOSIS — E785 Hyperlipidemia, unspecified: Secondary | ICD-10-CM | POA: Insufficient documentation

## 2023-01-17 DIAGNOSIS — I351 Nonrheumatic aortic (valve) insufficiency: Secondary | ICD-10-CM | POA: Insufficient documentation

## 2023-01-17 LAB — ECHOCARDIOGRAM COMPLETE
AR max vel: 4.56 cm2
AV Area VTI: 3.88 cm2
AV Area mean vel: 4.53 cm2
AV Mean grad: 3 mmHg
AV Peak grad: 6.1 mmHg
AV Vena cont: 0.4 cm
Ao pk vel: 1.23 m/s
Area-P 1/2: 2.42 cm2
Calc EF: 61 %
P 1/2 time: 604 msec
S' Lateral: 3.3 cm
Single Plane A2C EF: 58.2 %
Single Plane A4C EF: 64.4 %

## 2023-01-17 NOTE — Progress Notes (Signed)
Echocardiogram 2D Echocardiogram has been performed.  Robert [name] 01/17/2023, 11:46 AM

## 2023-01-23 NOTE — Telephone Encounter (Signed)
I s/w surgeon's office and surgery date has not yet been set.

## 2023-01-23 NOTE — Telephone Encounter (Signed)
Echo completed 6/6. Patient has upcoming appointment with Dr. Laneta Simmers 7/3. Will you please contact surgical scheduler to determine if procedure has been scheduled. Per previous note from Dr. Izora Ribas, would need agreement from CT surgery/oncology/surgeon for pt to proceed.

## 2023-01-29 ENCOUNTER — Encounter: Payer: Self-pay | Admitting: Internal Medicine

## 2023-01-29 ENCOUNTER — Encounter: Payer: Self-pay | Admitting: Surgery

## 2023-02-04 NOTE — Assessment & Plan Note (Signed)
Continue current treatment with oncology

## 2023-02-04 NOTE — Assessment & Plan Note (Signed)
Well controlled, no changes to meds. Encouraged heart healthy diet such as the DASH diet and exercise as tolerated.  °

## 2023-02-04 NOTE — Assessment & Plan Note (Signed)
Hydrate and monitor 

## 2023-02-04 NOTE — Assessment & Plan Note (Signed)
Cardiology has recommended a LDL goal of <70 and patient is agreeable but they have recommended we institute therapy after his cancer surgery so we will hold off on adding Atorvastatin 10 mg tabs, 1 daily until after surgery. Maintain heart healthy diet such as Mediterranean, MIND or DASH diet, increase exercise, avoid trans fats, simple carbohydrates and processed foods, consider a krill or fish or flaxseed oil cap daily.   

## 2023-02-04 NOTE — Assessment & Plan Note (Signed)
Metformin to 500 mg daily and add Januvia. minimize simple carbs. Increase exercise as tolerated.

## 2023-02-05 ENCOUNTER — Ambulatory Visit (INDEPENDENT_AMBULATORY_CARE_PROVIDER_SITE_OTHER): Payer: BLUE CROSS/BLUE SHIELD | Admitting: Family Medicine

## 2023-02-05 ENCOUNTER — Encounter: Payer: Self-pay | Admitting: Family Medicine

## 2023-02-05 VITALS — BP 126/74 | HR 64 | Temp 98.0°F | Resp 16 | Ht 75.0 in | Wt 250.2 lb

## 2023-02-05 DIAGNOSIS — E669 Obesity, unspecified: Secondary | ICD-10-CM | POA: Diagnosis not present

## 2023-02-05 DIAGNOSIS — C20 Malignant neoplasm of rectum: Secondary | ICD-10-CM | POA: Diagnosis not present

## 2023-02-05 DIAGNOSIS — E785 Hyperlipidemia, unspecified: Secondary | ICD-10-CM

## 2023-02-05 DIAGNOSIS — I10 Essential (primary) hypertension: Secondary | ICD-10-CM

## 2023-02-05 DIAGNOSIS — D509 Iron deficiency anemia, unspecified: Secondary | ICD-10-CM

## 2023-02-05 DIAGNOSIS — E1169 Type 2 diabetes mellitus with other specified complication: Secondary | ICD-10-CM

## 2023-02-05 DIAGNOSIS — N179 Acute kidney failure, unspecified: Secondary | ICD-10-CM

## 2023-02-05 DIAGNOSIS — Z7984 Long term (current) use of oral hypoglycemic drugs: Secondary | ICD-10-CM

## 2023-02-05 NOTE — Patient Instructions (Signed)
Thoracic Aortic Aneurysm  An aneurysm is a bulge in an artery. It happens when blood pushes against a weak or damaged artery wall. A thoracic aortic aneurysm (TAA) happens in the upper part of the aorta, between the heart and the diaphragm. The aorta is the largest artery of the body. It supplies blood from the heart to the rest of the body. Some aneurysms may not cause symptoms. But a TAA can cause two serious problems: It can grow and then burst (rupture). It can cause blood to flow between the layers of the wall of the aorta through a tear (aortic dissection). These problems are medical emergencies. They can cause internal bleeding and should be treated right away. What are the causes? A TAA may be caused by: Cystic medial degeneration. This is when the fibers of the wall of the aorta become weak and expand. A genetic disease that weakens body tissue. This includes Marfan syndrome. In some cases, the cause is not known. What increases the risk? You may be more likely to have an aneurysm if: You are 53 years of age or older. You have a family history of aneurysms. You use or have used nicotine or tobacco products. You have: Arteriosclerosis. This is when your arteries harden. Arteritis. This is inflammation of the walls of an artery. An injury or trauma to your aorta. High blood pressure (hypertension). High cholesterol. Bicuspid aortic valve. This is when only two flaps (valves) over your aorta are working instead of three. This can make it harder for your heart to pump blood. What are the signs or symptoms? Symptoms depend on how big the aneurysm is and how fast it is growing. Most grow slowly and do not cause symptoms. In some cases, you may have: Pain in your chest, back, sides, or abdomen. If the pain is sudden and severe, it may mean that the aneurysm has ruptured. A cough or hoarseness. Shortness of breath. Trouble swallowing. Swelling in your face, arms, or  legs. Fainting. How is this diagnosed? This condition may be diagnosed based on: Ultrasound. X-rays. CT scan. MRI. Lab tests. Angiogram. This test checks your arteries for damage or blockage. Many aneurysms are found during exams for other conditions. How is this treated? Treatment depends on: The size of the aneurysm. How fast it is growing. Your age. Risk factors for rupture. If your aneurysm is smaller than 2.2 inches (5.5 cm), you may need: Medicines to: Control blood pressure. Treat pain. Fight infection. Control cholesterol. An ultrasound or CT scan every 6 months or year to see if the aneurysm is getting bigger. If your aneurysm is larger or growing fast, you may need surgery. Follow these instructions at home: Eating and drinking  Eat a healthy diet. You may need to: Take in less salt (sodium). Too much salt can raise your blood pressure and increase your risk for a TAA. Avoid foods that are high in saturated fat and cholesterol. These include red meat and some dairy products. Eat less sugar. Take in more fiber. You may need to eat more whole grains, vegetables, and fruit. Lifestyle Do not use any products that contain nicotine or tobacco. These products include cigarettes, chewing tobacco, and vaping devices, such as e-cigarettes. If you need help quitting, ask your health care provider. Check your blood pressure often. Follow instructions on how to keep it within normal limits. Have your blood sugar (glucose) level and cholesterol levels checked often. Exercise on a regular basis. Talk with your provider about how often  to exercise and which types of exercise are best for you. Maintain a healthy weight. Alcohol use Do not drink alcohol if: Your provider tells you not to drink. You are pregnant, may be pregnant, or plan to become pregnant. If you drink alcohol: Limit how much you have to: 0-1 drink a day if you are male. 0-2 drinks if you are male. Know how  much alcohol is in your drink. In the [place] one drink is one 12 oz bottle of beer (355 mL), one 5 oz glass of wine (148 mL), or one 1 oz glass of hard liquor (44 mL). General instructions Take over-the-counter and prescription medicines only as told by your provider. You may have to avoid lifting. Ask your provider how much you can safely lift. Keep all follow-up visits. Your provider will need to watch the size of your aneurysm and how fast it is growing. Contact a health care provider if: You lose weight and do not know why. You have trouble swallowing. You have a cough or hoarseness. Get help right away if: You have sudden, severe pain in your abdomen, side, or back. It may move into your chest and arms. You are short of breath. You feel light-headed, or you faint. You have any symptoms of a stroke. "BE FAST" is an easy way to remember the main warning signs of a stroke: B - Balance. Signs are dizziness, sudden trouble walking, or loss of balance. E - Eyes. Signs are trouble seeing or a sudden change in vision. F - Face. Signs are sudden weakness or numbness of the face, or the face or eyelid drooping on one side. A - Arms. Signs are weakness or numbness in an arm. This happens suddenly and usually on one side of the body. S - Speech. Signs are sudden trouble speaking, slurred speech, or trouble understanding what people say. T - Time. Time to call emergency services. Write down what time symptoms started. You have other signs of a stroke, such as: A sudden, severe headache with no known cause. Nausea or vomiting. Seizure. These symptoms may be an emergency. Get help right away. Call 911. Do not wait to see if the symptoms will go away. Do not drive yourself to the hospital. This information is not intended to replace advice given to you by your health care provider. Make sure you discuss any questions you have with your health care provider. Document Revised: 03/01/2022 Document  Reviewed: 03/01/2022 Elsevier Patient Education  2024 [employer].

## 2023-02-05 NOTE — Progress Notes (Signed)
Subjective:    Patient ID: Robert [name], male    DOB: Dec 18, 1970, 52 y.o.   MRN: [id number]  Chief Complaint  Patient presents with  . Follow-up    Follow up    HPI Discussed the use of AI scribe software for clinical note transcription with the patient, who gave verbal consent to proceed.  History of Present Illness   The patient, with a history of cancer and heart issues, presents for a routine follow-up. 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 The neuropathy has improved slightly over time, but still causes discomfort. The patient also mentions a connection between their reflux and the output of their colostomy bag, noting an increase in output when reflux is worse.  The patient is currently in a "wait and watch" phase regarding their heart condition. They had a CT scan and an echo done recently due to concerns about a bicuspid valve and aortic aneurysm. The patient is frustrated with the delay in receiving results and is eager to move forward with the reversal of the colostomy.  The patient also mentions a spot found in the base of their right lung during the CT scan. They are not overly concerned about it, as it is believed to be a collapse of the lung. However, they plan to bring it up with their oncologist during their next visit.        Past Medical History:  Diagnosis Date  . Arthralgia 01/11/2013   Diffuse, b/l Hips R>L Knees, ankles, low back  . Arthritis   . Back pain 12/08/2015  . Cancer (HCC)   . Coronary artery disease   . Diabetes mellitus type 2 in obese 11/09/2012  . Dyslipidemia 11/09/2012  . Esophageal reflux 12/08/2015  . Feeling grief 06/09/2020  . History of migraine headaches   . Hyperglycemia 11/09/2012  . Hypertension     Past Surgical History:  Procedure Laterality Date  . DIVERTING ILEOSTOMY N/A 08/23/2022   Procedure: DIVERTING LOOP ILEOSTOMY;  Surgeon: Andria Meuse, MD;  Location:  [facility];  Service: General;  Laterality: N/A;  . EUS N/A 06/21/2022   Procedure: LOWER ENDOSCOPIC ULTRASOUND (EUS);  Surgeon: Lemar Lofty., MD;  Location: [facility];  Service: Gastroenterology;  Laterality: N/A;  . FLEXIBLE SIGMOIDOSCOPY N/A 06/21/2022   Procedure: FLEXIBLE SIGMOIDOSCOPY;  Surgeon: Meridee Score Netty Starring., MD;  Location: [facility];  Service: Gastroenterology;  Laterality: N/A;  . FLEXIBLE SIGMOIDOSCOPY N/A 08/23/2022   Procedure: FLEXIBLE SIGMOIDOSCOPY;  Surgeon: Andria Meuse, MD;  Location: [facility];  Service: General;  Laterality: N/A;  . PORTACATH PLACEMENT N/A 09/21/2022   Procedure: INSERTION PORT-A-CATH WITH ULTRASOUND GUIDANCE;  Surgeon: Andria Meuse, MD;  Location: [facility];  Service: General;  Laterality: N/A;  . XI ROBOTIC ASSISTED LOWER ANTERIOR RESECTION N/A 08/23/2022   Procedure: XI ROBOTIC ASSISTED LOWER ANTERIOR RESECTION with Introperative assessment of profusion and TAP Block;  Surgeon: Andria Meuse, MD;  Location: [facility];  Service: General;  Laterality: N/A;    Family History  Problem Relation Age of Onset  . Arthritis Mother   . Hypertension Mother   . Diabetes Mother   . Cancer Mother   . Arthritis Father   . Hypertension Father   . Diabetes Father   . Sleep apnea Father   . Obesity Father   . Arthritis Maternal Grandmother   . Diabetes Maternal Grandmother   . Stroke Maternal Grandmother   . Arthritis Paternal Grandmother   .  Diabetes Paternal Grandmother   . Cancer Paternal Grandmother        lung, smoker  . Diabetes Paternal Grandfather   . Heart disease Paternal Grandfather 31       MI  . Hyperlipidemia Brother   . Hypertension Brother   . Diabetes Brother   . COPD Maternal Grandfather   . Kidney disease Neg Hx     Social History   Socioeconomic History  . Marital status: Married    Spouse name: Not on file  . Number of children: 4  . Years of education: Not on file  . Highest education level:  Bachelor's degree (e.g., BA, AB, BS)  Occupational History  . Occupation: Community education officer: [employer]  Tobacco Use  . Smoking status: Former    Packs/day: 1.50    Years: 10.00    Additional pack years: 0.00    Total pack years: 15.00    Types: Cigarettes    Quit date: 08/14/1995    Years since quitting: 27.4  . Smokeless tobacco: Never  Vaping Use  . Vaping Use: Never used  Substance and Sexual Activity  . Alcohol use: Yes    Alcohol/week: 6.0 - 10.0 standard drinks of alcohol    Types: 6 - 10 Cans of beer per week    Comment: 2/day, more in the past  . Drug use: Yes    Types: Marijuana    Comment: marijuana 12/11  . Sexual activity: Yes    Comment: lives with wife and daughter, no dietary restrictions  Other Topics Concern  . 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 Difficulty of Paying Living Expenses: Not very hard  Food Insecurity: No Food Insecurity (12/23/2022)   Hunger Vital Sign   . Worried About Programme researcher, broadcasting/film/video in the Last Year: Never true   . Ran Out of Food in the Last Year: Never true  Transportation Needs: No Transportation Needs (12/23/2022)   PRAPARE - Transportation   . Lack of Transportation (Medical): No   . Lack of Transportation (Non-Medical): No  Physical Activity: Unknown (12/23/2022)   Exercise Vital Sign   . Days of Exercise per Week: 5 days   . Minutes of Exercise per Session: Patient declined  Stress: No Stress Concern Present (12/23/2022)   [employer] of Occupational Health - Occupational Stress Questionnaire   . Feeling of Stress : Only a little  Social Connections:  Moderately Integrated (12/23/2022)   Social Connection and Isolation Panel [NHANES]   . Frequency of Communication with Friends and Family: Three times a week   . Frequency of Social Gatherings with Friends and Family: Patient declined   . Attends Religious Services: More than 4 times per year   . Active Member of Clubs or Organizations: No   . Attends Banker Meetings: Not on file   . Marital Status: Married  Catering manager Violence: Not At Risk (12/04/2022)   Humiliation, Afraid, Rape, and Kick questionnaire   . Fear of Current  or Ex-Partner: No   . Emotionally Abused: No   . Physically Abused: No   . Sexually Abused: No    Outpatient Medications Prior to Visit  Medication Sig Dispense Refill  . amLODipine (NORVASC) 5 MG tablet Take 5 mg by mouth daily as needed (for a systolic number reading greater than 150 or as otherwise directed for elevated B/P).    . carvedilol (COREG) 25 MG tablet Take 1 tablet (25 mg total) by mouth 2 (two) times daily. 180 tablet 3  . glucose blood (COOL BLOOD GLUCOSE TEST STRIPS) test strip Check blood sugar once daily 100 each 12  . lidocaine-prilocaine (EMLA) cream Apply 1 Application topically as needed (for port access).    . metFORMIN (GLUCOPHAGE) 500 MG tablet Take 1 tablet (500 mg total) by mouth daily with breakfast. 90 tablet 1  . ondansetron (ZOFRAN) 8 MG tablet Take 8 mg by mouth every 6 (six) hours as needed for nausea or vomiting.    . pantoprazole (PROTONIX) 40 MG tablet Take 1 tablet (40 mg total) by mouth daily. 90 tablet 1  . polycarbophil (FIBERCON) 625 MG tablet Take 1 tablet (625 mg total) by mouth daily. 30 tablet 2  . prochlorperazine (COMPAZINE) 10 MG tablet Take 10 mg by mouth every 6 (six) hours as needed for nausea or vomiting.    . sitaGLIPtin (JANUVIA) 100 MG tablet Take 1 tablet (100 mg total) by mouth daily. 30 tablet 4  . tiZANidine (ZANAFLEX) 2 MG tablet Take 1-2 tablets (2-4 mg total) by mouth 3 (three) times  daily as needed for muscle spasms. 60 tablet 2   No facility-administered medications prior to visit.    Allergies  Allergen Reactions  . Advil [Ibuprofen] Shortness Of Breath  . Aleve [Naproxen] Shortness Of Breath  . Mucinex [Guaifenesin Er] Shortness Of Breath, Itching, Swelling, Dermatitis, Rash and Other (See Comments)    Swelling of hands and feet  . Nsaids Shortness Of Breath, Itching, Swelling, Rash and Other (See Comments)    Aleve and Advil   . Levaquin [Levofloxacin]     Aortic aneurysm; not recommend in this condition unless no other agents would cover  . Latex Rash and Other (See Comments)    "Cartilage will harden"    Review of Systems  Constitutional:  Negative for fever and malaise/fatigue.  HENT:  Negative for congestion.   Eyes:  Negative for blurred vision.  Respiratory:  Negative for shortness of breath.   Cardiovascular:  Negative for chest pain, palpitations and leg swelling.  Gastrointestinal:  Negative for abdominal pain, blood in stool and nausea.  Genitourinary:  Negative for dysuria and frequency.  Musculoskeletal:  Negative for falls.  Skin:  Negative for rash.  Neurological:  Negative for dizziness, loss of consciousness and headaches.  Endo/Heme/Allergies:  Negative for environmental allergies.  Psychiatric/Behavioral:  Negative for depression. The patient is not nervous/anxious.       Objective:    Physical Exam Vitals reviewed.  Constitutional:      Appearance: Normal appearance. He is not ill-appearing.  HENT:     Head: Normocephalic and atraumatic.     Nose: Nose normal.  Eyes:     Conjunctiva/sclera: Conjunctivae normal.  Cardiovascular:     Rate and Rhythm: Normal rate.     Pulses: Normal pulses.     Heart sounds: Normal heart sounds. No murmur heard. Pulmonary:     Effort: Pulmonary effort is normal.     Breath sounds: Normal breath sounds. No wheezing.  Abdominal:     Palpations: Abdomen is soft. There is no mass.      Tenderness: There is no abdominal tenderness.  Musculoskeletal:     Cervical back: Normal range of motion.     Right lower leg: No edema.     Left lower leg: No edema.  Skin:    General: Skin is warm and dry.  Neurological:     General: No focal deficit present.     Mental Status: He is alert and oriented to person, place, and time.  Psychiatric:        Mood and Affect: Mood normal.   BP 126/74 (BP Location: Left Arm, Patient Position: Sitting, Cuff Size: Normal)   Pulse 64   Temp 98 F (36.7 C) (Oral)   Resp 16   Ht 6\' 3"  (1.905 m)   Wt 250 lb 3.2 oz (113.5 kg)   SpO2 95%   BMI 31.27 kg/m  Wt Readings from Last 3 Encounters:  02/05/23 250 lb 3.2 oz (113.5 kg)  01/02/23 254 lb 4.8 oz (115.3 kg)  12/27/22 255 lb (115.7 kg)    Diabetic Foot Exam - Simple   No data filed    Lab Results  Component Value Date   WBC 5.7 01/02/2023   HGB 11.8 (L) 01/02/2023   HCT 36.3 (L) 01/02/2023   PLT 130 (L) 01/02/2023   GLUCOSE 175 (H) 01/02/2023   CHOL 120 09/29/2022   TRIG 107 09/29/2022   HDL 27 (L) 09/29/2022   LDLCALC 72 09/29/2022   ALT 19 01/02/2023   AST 25 01/02/2023   NA 139 01/02/2023   K 4.1 01/02/2023   CL 103 01/02/2023   CREATININE 1.39 (H) 01/02/2023   BUN 19 01/02/2023   CO2 29 01/02/2023   TSH 0.69 06/19/2022   PSA 0.35 11/03/2021   HGBA1C 7.2 (H) 09/17/2022   MICROALBUR 0.8 11/03/2021    Lab Results  Component Value Date   TSH 0.69 06/19/2022   Lab Results  Component Value Date   WBC 5.7 01/02/2023   HGB 11.8 (L) 01/02/2023   HCT 36.3 (L) 01/02/2023   MCV 86.6 01/02/2023   PLT 130 (L) 01/02/2023   Lab Results  Component Value Date   NA 139 01/02/2023   K 4.1 01/02/2023   CO2 29 01/02/2023   GLUCOSE 175 (H) 01/02/2023   BUN 19 01/02/2023   CREATININE 1.39 (H) 01/02/2023   BILITOT 0.5 01/02/2023   ALKPHOS 69 01/02/2023   AST 25 01/02/2023   ALT 19 01/02/2023   PROT 7.2 01/02/2023   ALBUMIN 4.1 01/02/2023   CALCIUM 9.5 01/02/2023    ANIONGAP 7 01/02/2023   GFR 79.33 06/19/2022   Lab Results  Component Value Date   CHOL 120 09/29/2022   Lab Results  Component Value Date   HDL 27 (L) 09/29/2022   Lab Results  Component Value Date   LDLCALC 72 09/29/2022   Lab Results  Component Value Date   TRIG 107 09/29/2022   Lab Results  Component Value Date   CHOLHDL 4.4 09/29/2022   Lab Results  Component Value Date   HGBA1C 7.2 (H) 09/17/2022       Assessment & Plan:  Primary hypertension Assessment & Plan: Well controlled, no changes to meds. Encouraged heart healthy diet such as the DASH diet and exercise as tolerated.    Orders: -     CBC with Differential/Platelet -     Comprehensive metabolic panel -     TSH  Hyperlipidemia LDL goal <70 Assessment & Plan: Cardiology has recommended a LDL goal of <70 and patient is agreeable but they have recommended we institute therapy after his cancer surgery so we will hold off on adding Atorvastatin 10 mg tabs, 1 daily until after surgery. Maintain heart healthy diet such as Mediterranean, MIND or DASH diet, increase exercise, avoid trans fats, simple carbohydrates and processed foods, consider a krill or fish or flaxseed oil cap daily.    Orders: -     Lipid panel  Type 2 diabetes mellitus with obesity (HCC) Assessment & Plan: Metformin to 500 mg daily and add Januvia. minimize simple carbs. Increase exercise as tolerated.  Orders: -     Hemoglobin A1c -     Microalbumin / creatinine urine ratio  Rectal cancer (HCC) Assessment & Plan: Continue current treatment with oncology   Acute renal failure, unspecified acute renal failure type [facility]) Assessment & Plan: Hydrate and monitor    Iron deficiency anemia, unspecified iron deficiency anemia type -     Iron, TIBC and Ferritin Panel    Assessment and Plan    Aortic Aneurysm: Awaiting surgical consultation with Dr. Loletha Grayer on July 3rd. CT scan shows slight increase in size from 4.9 to 5.0 cm, but  still under the 5.5 cm threshold for surgical intervention. -Continue monitoring and follow up with Dr. Loletha Grayer.  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 Improvement noted over time. -Consider supplementation with fatty acids (fish or krill oil) to support nerve repair. -Consider topical application of menthol-based products to stimulate blood flow.  Iron Deficiency Anemia: Reports of fluctuating anemia post-chemotherapy. Last iron panel was four months ago. -Order complete blood count, iron panel, and other routine labs today.  Lung lesion Small spot noted at the base of the right lung on recent CT scan, likely atelectasis. Patient has a history of cancer. -Recommend patient to discuss this finding with oncologist during next month's follow-up visit.  Diabetes Mellitus: Patient on Januvia, reports reduced sugar cravings. Last A1C check was in February. -Order A1C and cholesterol panel today. -Advise patient to check blood glucose twice a week (once fasting, once postprandial) and adjust frequency based on trends.  General Health Maintenance / Followup Plans -Encourage continued healthy diet and hydration. -Advise patient to discuss lung nodule with oncologist during next month's follow-up visit. -Plan for annual physical in 4-6 months.         Danise Edge, MD

## 2023-02-06 ENCOUNTER — Encounter: Payer: Self-pay | Admitting: Hematology

## 2023-02-06 ENCOUNTER — Other Ambulatory Visit: Payer: Self-pay

## 2023-02-06 LAB — CBC WITH DIFFERENTIAL/PLATELET
Basophils Absolute: 0.1 10*3/uL (ref 0.0–0.1)
Basophils Relative: 0.8 % (ref 0.0–3.0)
Eosinophils Absolute: 0.3 10*3/uL (ref 0.0–0.7)
Eosinophils Relative: 3.8 % (ref 0.0–5.0)
HCT: 41.5 % (ref 39.0–52.0)
Hemoglobin: 13.2 g/dL (ref 13.0–17.0)
Lymphocytes Relative: 29.3 % (ref 12.0–46.0)
Lymphs Abs: 2 10*3/uL (ref 0.7–4.0)
MCHC: 31.7 g/dL (ref 30.0–36.0)
MCV: 88.7 fl (ref 78.0–100.0)
Monocytes Absolute: 0.6 10*3/uL (ref 0.1–1.0)
Monocytes Relative: 8.3 % (ref 3.0–12.0)
Neutro Abs: 3.9 10*3/uL (ref 1.4–7.7)
Neutrophils Relative %: 57.8 % (ref 43.0–77.0)
Platelets: 190 10*3/uL (ref 150.0–400.0)
RBC: 4.68 Mil/uL (ref 4.22–5.81)
RDW: 18.6 % — ABNORMAL HIGH (ref 11.5–15.5)
WBC: 6.7 10*3/uL (ref 4.0–10.5)

## 2023-02-06 LAB — IRON,TIBC AND FERRITIN PANEL
%SAT: 12 % (calc) — ABNORMAL LOW (ref 20–48)
Ferritin: 7 ng/mL — ABNORMAL LOW (ref 38–380)
Iron: 56 ug/dL (ref 50–180)
TIBC: 471 mcg/dL (calc) — ABNORMAL HIGH (ref 250–425)

## 2023-02-06 LAB — HEMOGLOBIN A1C: Hgb A1c MFr Bld: 6.6 % — ABNORMAL HIGH (ref 4.6–6.5)

## 2023-02-06 LAB — LIPID PANEL
Cholesterol: 142 mg/dL (ref 0–200)
HDL: 38.9 mg/dL — ABNORMAL LOW (ref >39.00)
NonHDL: 103.39
Total CHOL/HDL Ratio: 4
Triglycerides: 295 mg/dL — ABNORMAL HIGH (ref 0.0–149.0)
VLDL: 59 mg/dL — ABNORMAL HIGH (ref 0.0–40.0)

## 2023-02-06 LAB — COMPREHENSIVE METABOLIC PANEL
ALT: 18 U/L (ref 0–53)
AST: 26 U/L (ref 0–37)
Albumin: 4.3 g/dL (ref 3.5–5.2)
Alkaline Phosphatase: 55 U/L (ref 39–117)
BUN: 18 mg/dL (ref 6–23)
CO2: 23 mEq/L (ref 19–32)
Calcium: 9.9 mg/dL (ref 8.4–10.5)
Chloride: 102 mEq/L (ref 96–112)
Creatinine, Ser: 1.61 mg/dL — ABNORMAL HIGH (ref 0.40–1.50)
GFR: 48.37 mL/min — ABNORMAL LOW (ref >60.00)
Glucose, Bld: 158 mg/dL — ABNORMAL HIGH (ref 70–99)
Potassium: 3.8 mEq/L (ref 3.5–5.1)
Sodium: 137 mEq/L (ref 135–145)
Total Bilirubin: 0.7 mg/dL (ref 0.2–1.2)
Total Protein: 7.4 g/dL (ref 6.0–8.3)

## 2023-02-06 LAB — MICROALBUMIN / CREATININE URINE RATIO
Creatinine,U: 345 mg/dL
Microalb Creat Ratio: 0.2 mg/g (ref 0.0–30.0)
Microalb, Ur: 0.7 mg/dL (ref 0.0–1.9)

## 2023-02-06 LAB — LDL CHOLESTEROL, DIRECT: Direct LDL: 71 mg/dL

## 2023-02-06 LAB — TSH: TSH: 0.75 u[IU]/mL (ref 0.35–5.50)

## 2023-02-07 ENCOUNTER — Other Ambulatory Visit: Payer: Self-pay

## 2023-02-13 ENCOUNTER — Encounter: Payer: Self-pay | Admitting: Surgery

## 2023-02-13 ENCOUNTER — Ambulatory Visit (INDEPENDENT_AMBULATORY_CARE_PROVIDER_SITE_OTHER): Payer: BLUE CROSS/BLUE SHIELD | Admitting: Surgery

## 2023-02-13 VITALS — BP 140/78 | HR 82 | Resp 20 | Ht 75.0 in | Wt 250.0 lb

## 2023-02-13 DIAGNOSIS — I7121 Aneurysm of the ascending aorta, without rupture: Secondary | ICD-10-CM | POA: Diagnosis not present

## 2023-02-13 NOTE — Progress Notes (Signed)
HPI:  The patient is a 52 year old gentleman with a history of type 2 diabetes, dyslipidemia, and hypertension who was diagnosed with rectal cancer requiring surgery.  Preoperative coronary CT scan and echocardiogram showed a 5.2 cm aortic root and 5 cm ascending aorta with moderate aortic insufficiency and an LV diastolic diameter of 6.2 cm.  He subsequently underwent low anterior resection with an ileostomy by Dr. Cliffton Asters on 08/23/2022.  This was Stage pT3 pN0, IIA.  He underwent postoperative chemotherapy which has been completed.  He did have a lot of problems with excessive ileostomy output which he was not keeping up with and developed dehydration.  He said that he is scheduled to undergo ileostomy reversal later this month.  He had a follow-up echocardiogram on 01/17/2023 showing a trileaflet aortic valve with moderate aortic insufficiency with a pressure half-time of 604 ms.  The aortic root was measured at 4.8 cm with the ascending aorta measured at 4.9 cm.  LV diastolic dimension was 6 cm and unchanged.  CTA of the chest on 01/11/2023 showed the sinus of Valsalva dimensions to be 47 mm, 49 mm, and 46 mm.  The sinotubular junction was 50 mm and the ascending aorta 4.6 cm.  Aortic arch was 3.0 cm.  The descending aorta at the level of the carina was 2.4 cm. Current Outpatient Medications  Medication Sig Dispense Refill   amLODipine (NORVASC) 5 MG tablet Take 5 mg by mouth daily as needed (for a systolic number reading greater than 150 or as otherwise directed for elevated B/P).     carvedilol (COREG) 25 MG tablet Take 1 tablet (25 mg total) by mouth 2 (two) times daily. 180 tablet 3   glucose blood (COOL BLOOD GLUCOSE TEST STRIPS) test strip Check blood sugar once daily 100 each 12   lidocaine-prilocaine (EMLA) cream Apply 1 Application topically as needed (for port access).     metFORMIN (GLUCOPHAGE) 500 MG tablet Take 1 tablet (500 mg total) by mouth daily with breakfast. 90 tablet 1    ondansetron (ZOFRAN) 8 MG tablet Take 8 mg by mouth every 6 (six) hours as needed for nausea or vomiting.     pantoprazole (PROTONIX) 40 MG tablet Take 1 tablet (40 mg total) by mouth daily. 90 tablet 1   polycarbophil (FIBERCON) 625 MG tablet Take 1 tablet (625 mg total) by mouth daily. 30 tablet 2   prochlorperazine (COMPAZINE) 10 MG tablet Take 10 mg by mouth every 6 (six) hours as needed for nausea or vomiting.     sitaGLIPtin (JANUVIA) 100 MG tablet Take 1 tablet (100 mg total) by mouth daily. 30 tablet 4   tiZANidine (ZANAFLEX) 2 MG tablet Take 1-2 tablets (2-4 mg total) by mouth 3 (three) times daily as needed for muscle spasms. 60 tablet 2   No current facility-administered medications for this visit.     Physical Exam: BP (!) 140/78   Pulse 82   Resp 20   Ht 6\' 3"  (1.905 m)   Wt 113.4 kg   SpO2 96% Comment: RA  BMI 31.25 kg/m  He looks well. Cardiac exam shows a regular rate and rhythm with normal heart sounds.  There is no murmur. Lungs are clear. There is no peripheral edema.  Diagnostic Tests:  ECHOCARDIOGRAM REPORT       Patient Name:   Robert [name] Date of Exam: 01/17/2023  Medical Rec #:  [id number]    Height:       75.0 in  Accession #:  [id number]   Weight:       254.3 lb  Date of Birth:  01-15-1971    BSA:          2.430 m  Patient Age:    51 years     BP:           117/71 mmHg  Patient Gender: M            HR:           64 bpm.  Exam Location:  Outpatient   Procedure: 2D Echo, Cardiac Doppler, Color Doppler, 3D Echo and Strain  Analysis   Indications:   Aortic valve disorder I35.9    History:        Patient has prior history of Echocardiogram examinations,  most                 recent 07/13/2022. CAD; Risk Factors:Hypertension,  Dyslipidemia                 and Diabetes. Ascending aortic aneurysm.    Sonographer:    Eulah Pont RDCS  Referring Phys: [id number] Alleen Borne     Sonographer Comments: Global longitudinal strain was attempted.   IMPRESSIONS     1. Left ventricular ejection fraction, by estimation, is 60 to 65%. Left  ventricular ejection fraction by 3D volume is 60 %. The left ventricle has  normal function. The left ventricle has no regional wall motion  abnormalities. The left ventricular  internal cavity size was mildly dilated. There is mild left ventricular  hypertrophy. Left ventricular diastolic parameters are consistent with  Grade I diastolic dysfunction (impaired relaxation). The average left  ventricular global longitudinal strain is   -18.3 %. The global longitudinal strain is normal.   2. Right ventricular systolic function is normal. The right ventricular  size is normal.   3. The mitral valve is normal in structure. No evidence of mitral valve  regurgitation. No evidence of mitral stenosis.   4. The aortic valve is tricuspid. Aortic valve regurgitation is moderate.  No aortic stenosis is present. Aortic regurgitation PHT measures 604 msec.   5. Aneurysm of the aortic root, measuring 48 mm. Aneurysm of the  ascending aorta, measuring 49 mm. There is moderate dilatation of the  aortic root, measuring 48 mm.   6. The inferior vena cava is normal in size with greater than 50%  respiratory variability, suggesting right atrial pressure of 3 mmHg.   Comparison(s): No significant change from prior study. Prior images  reviewed side by side.   FINDINGS   Left Ventricle: Left ventricular ejection fraction, by estimation, is 60  to 65%. Left ventricular ejection fraction by 3D volume is 60 %. The left  ventricle has normal function. The left ventricle has no regional wall  motion abnormalities. The average  left ventricular global longitudinal strain is -18.3 %. The global  longitudinal strain is normal. The left ventricular internal cavity size  was mildly dilated. There is mild left ventricular hypertrophy. Left  ventricular diastolic parameters are  consistent with Grade I diastolic dysfunction  (impaired relaxation).   Right Ventricle: The right ventricular size is normal. No increase in  right ventricular wall thickness. Right ventricular systolic function is  normal.   Left Atrium: Left atrial size was normal in size.   Right Atrium: Right atrial size was normal in size.   Pericardium: There is no evidence of pericardial effusion.   Mitral Valve: The mitral valve is normal  in structure. No evidence of  mitral valve regurgitation. No evidence of mitral valve stenosis.   Tricuspid Valve: The tricuspid valve is normal in structure. Tricuspid  valve regurgitation is not demonstrated. No evidence of tricuspid  stenosis.   Aortic Valve: The aortic valve is tricuspid. Aortic valve regurgitation is  moderate. Aortic regurgitation PHT measures 604 msec. No aortic stenosis  is present. Aortic valve mean gradient measures 3.0 mmHg. Aortic valve  peak gradient measures 6.1 mmHg.  Aortic valve area, by VTI measures 3.88 cm.   Pulmonic Valve: The pulmonic valve was normal in structure. Pulmonic valve  regurgitation is trivial. No evidence of pulmonic stenosis.   Aorta: The aortic root is normal in size and structure. There is moderate  dilatation of the aortic root, measuring 48 mm. There is an aneurysm  involving the aortic root measuring 48 mm. There is an aneurysm involving  the ascending aorta measuring 49 mm.   Venous: The inferior vena cava is normal in size with greater than 50%  respiratory variability, suggesting right atrial pressure of 3 mmHg.   IAS/Shunts: No atrial level shunt detected by color flow Doppler.     LEFT VENTRICLE  PLAX 2D  LVIDd:         6.00 cm         Diastology  LVIDs:         3.30 cm         LV e' medial:    4.95 cm/s  LV PW:         1.30 cm         LV E/e' medial:  11.9  LV IVS:        1.30 cm         LV e' lateral:   5.36 cm/s  LVOT diam:     2.40 cm         LV E/e' lateral: 11.0  LV SV:         115  LV SV Index:   47              2D   LVOT Area:     4.52 cm        Longitudinal                                 Strain                                 2D Strain GLS  -18.3 %  LV Volumes (MOD)               Avg:  LV vol d, MOD    151.0 ml  A2C:                           3D Volume EF  LV vol d, MOD    163.0 ml      LV 3D EF:    Left  A4C:                                        ventricul  LV vol s, MOD    63.1 ml  ar  A2C:                                        ejection  LV vol s, MOD    58.0 ml                    fraction  A4C:                                        by 3D  LV SV MOD A2C:   87.9 ml                    volume is  LV SV MOD A4C:   163.0 ml                   60 %.  LV SV MOD BP:    96.4 ml                                   3D Volume EF:                                 3D EF:        60 %                                 LV EDV:       226 ml                                 LV ESV:       90 ml                                 LV SV:        136 ml   RIGHT VENTRICLE  RV S prime:     14.80 cm/s  TAPSE (M-mode): 2.3 cm   LEFT ATRIUM             Index        RIGHT ATRIUM           Index  LA diam:        4.50 cm 1.85 cm/m   RA Area:     16.80 cm  LA Vol (A2C):   73.9 ml 30.41 ml/m  RA Volume:   34.80 ml  14.32 ml/m  LA Vol (A4C):   76.6 ml 31.52 ml/m  LA Biplane Vol: 76.5 ml 31.48 ml/m   AORTIC VALVE                    PULMONIC VALVE  AV Area (Vmax):    4.56 cm     PR End Diast Vel: 3.71 msec  AV Area (Vmean):   4.53 cm  AV Area (VTI):     3.88 cm  AV Vmax:           123.00 cm/s  AV Vmean:          83.800  cm/s  AV VTI:            0.297 m  AV Peak Grad:      6.1 mmHg  AV Mean Grad:      3.0 mmHg  LVOT Vmax:         124.00 cm/s  LVOT Vmean:        83.900 cm/s  LVOT VTI:          0.255 m  LVOT/AV VTI ratio: 0.86  AI PHT:            604 msec  AR Vena Contracta: 0.40 cm    AORTA  Ao Root diam: 4.75 cm  Ao Asc diam:  4.90 cm   MITRAL VALVE  MV Area (PHT): 2.42 cm    SHUNTS  MV  Decel Time: 313 msec    Systemic VTI:  0.26 m  MV E velocity: 58.80 cm/s  Systemic Diam: 2.40 cm  MV A velocity: 69.50 cm/s  MV E/A ratio:  0.85   Donato Schultz MD  Electronically signed by Donato Schultz MD  Signature Date/Time: 01/17/2023/2:45:55 PM        Final      Narrative & Impression  CLINICAL DATA:  52 year old male with history of thoracic aortic aneurysm, preoperative planning.   EXAM: CT ANGIOGRAPHY CHEST WITH CONTRAST   TECHNIQUE: Multidetector CT imaging of the chest was performed using the standard protocol during bolus administration of intravenous contrast. Multiplanar CT image reconstructions and MIPs were obtained to evaluate the vascular anatomy.   RADIATION DOSE REDUCTION: This exam was performed according to the departmental dose-optimization program which includes automated exposure control, adjustment of the mA and/or kV according to patient size and/or use of iterative reconstruction technique.   CONTRAST:  100 mL , intravenous   COMPARISON:  07/27/2022, 10/22/2022   FINDINGS: Cardiovascular: Preferential opacification of the thoracic aorta. No evidence of thoracic aortic dissection. Normal heart size. No pericardial effusion.   Sinues of Valsalva: 47 mm 49 x 46 mm ,unchanged   Sinotubular Junction: 50 mm ,unchanged   Ascending Aorta: 46 mm ,unchanged   Aortic Arch: 30 mm ,unchanged   Descending aorta: 24 mm at the level of the carina ,unchanged   Branch vessels: Common origin of the brachiocephalic and left common carotid arteries. No significant atherosclerotic changes.   Coronary arteries: Normal origins and courses. Atherosclerotic calcifications about the left anterior and circumflex coronary arteries.   Main pulmonary artery: 32 mm ,unchanged. No evidence of central pulmonary embolism.   Pulmonary veins: No anomalous pulmonary venous return. No evidence of left atrial appendage thrombus.   Upper abdominal vasculature: Within  normal limits.   Mediastinum/Nodes: No enlarged mediastinal, hilar, or axillary lymph nodes. Thyroid gland, trachea, and esophagus demonstrate no significant findings.   Lungs/Pleura: Interval development of rounded consolidative opacity with surrounding ground-glass 5 3 by 1.6 cm the posterior, subpleural aspect of right lobe. Lungs are otherwise clear bilaterally.   Upper Abdomen: The visualized upper abdomen is within normal limits.   Musculoskeletal: No chest wall abnormality. No acute or significant osseous findings.   Review of the MIP images confirms the above findings.   IMPRESSION: Vascular:   1. 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 Recommend semi-annual imaging followup by CTA or MRA and referral to cardiothoracic surgery if not already obtained. This recommendation follows 2010 ACCF/AHA/AATS/ACR/ASA/SCA/SCAI/SIR/STS/SVM Guidelines for the Diagnosis and Management of Patients With Thoracic Aortic Disease. Circulation. 2010; 121: [id number]. Aortic aneurysm NOS (ICD10-I71.9)  2. Two vessel coronary atherosclerotic calcifications.   Non-Vascular:   Interval development of rounded consolidative opacity the posterior, subpleural right lung base. This finding is favored represent rounded atelectasis versus focal infectious/inflammatory etiology. Recommend clinical correlation.   Marliss Coots, MD   [facility]   [facility]     Electronically Signed   By: Marliss Coots M.D.   On: 01/11/2023 13:03   Impression:  I have personally reviewed his 2D echocardiogram and CTA studies.  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  The measurement from, measure to opposite sinus was 4.8 to 4.9 cm.  The sinotubular junction measured 5.0 cm and the ascending aorta about 4.9 cm.  He has a trileaflet aortic valve with moderate aortic insufficiency and mild LV dilatation at 6  cm by echocardiogram and 6.8 cm by MRA.  I think his aneurysm and aortic insufficiency are stable.  The measurements are still below the strict surgical threshold but he is likely require surgery in the future.  I think the most important thing at this point is to proceed with ileostomy reversal and allow him to completely recover from that and be sure that he does not develop recurrent cancer.  I have recommended that we continue to follow his aortic aneurysm and aortic insufficiency at this time.  Plan:  I will see him back in 6 months with a CTA of the chest/aorta with gating and he should have a 2D echocardiogram at that time.  I spent 20 minutes performing this established patient evaluation and > 50% of this time was spent face to face counseling and coordinating the care of this patient's aortic aneurysm and aortic insufficiency.    Alleen Borne, MD [facility] [phone]

## 2023-02-17 ENCOUNTER — Other Ambulatory Visit: Payer: Self-pay | Admitting: Family Medicine

## 2023-02-17 DIAGNOSIS — I152 Hypertension secondary to endocrine disorders: Secondary | ICD-10-CM

## 2023-02-17 DIAGNOSIS — I10 Essential (primary) hypertension: Secondary | ICD-10-CM

## 2023-02-17 NOTE — Assessment & Plan Note (Signed)
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 CT negative for distant metastasis -pT3N0M0 stage II -Initial staging MRI showed T1/T2, N0 rectal cancer and likely reactive inguinal nodes.  -rectal EUS showed uT2, uN0 disease, case reviewed in multidisciplinary conference and the recommendation was for upfront surgery -Proceeded with LAR and diverting ileostomy with Dr. Cliffton Asters on 08/23/2022. I reviewed surgical path which showed invasive grade 2 adenocarcinoma spanning 2.5 cm involving the rectum with carcinoma invading focally into the perirectal adipose tissue.  0/14 lymph nodes involved with clear margins and no LVI. Stage pT3 pN0, IIA, with no high risk features, MMR normal and MSI stable  -Case again discussed in conference and the recommendation is for 3 months adjuvant chemo. Dr. Mitzi Hansen felt that he could forego radiation -Began adjuvant FOLFOX 10/10/2022, s/p 4 cycles last given 11/21/22, cycle 5 held due to diarrhea and AKI which required hospital admission. -he tolerated cycle 5 chemotherapy overall well -Lab reviewed, adequate for treatment, will proceed with last cycle FOLFOX today

## 2023-02-18 ENCOUNTER — Other Ambulatory Visit: Payer: Self-pay

## 2023-02-18 ENCOUNTER — Encounter: Payer: Self-pay | Admitting: Hematology

## 2023-02-18 ENCOUNTER — Inpatient Hospital Stay (HOSPITAL_BASED_OUTPATIENT_CLINIC_OR_DEPARTMENT_OTHER): Payer: BLUE CROSS/BLUE SHIELD | Admitting: Hematology

## 2023-02-18 ENCOUNTER — Inpatient Hospital Stay: Payer: BLUE CROSS/BLUE SHIELD | Attending: Nurse Practitioner

## 2023-02-18 VITALS — BP 123/78 | HR 66 | Temp 99.0°F | Resp 15 | Ht 75.0 in | Wt 253.9 lb

## 2023-02-18 DIAGNOSIS — Z95828 Presence of other vascular implants and grafts: Secondary | ICD-10-CM

## 2023-02-18 DIAGNOSIS — C20 Malignant neoplasm of rectum: Secondary | ICD-10-CM

## 2023-02-18 DIAGNOSIS — Z452 Encounter for adjustment and management of vascular access device: Secondary | ICD-10-CM | POA: Diagnosis not present

## 2023-02-18 DIAGNOSIS — G620 Drug-induced polyneuropathy: Secondary | ICD-10-CM | POA: Insufficient documentation

## 2023-02-18 LAB — CBC WITH DIFFERENTIAL (CANCER CENTER ONLY)
Abs Immature Granulocytes: 0.02 10*3/uL (ref 0.00–0.07)
Basophils Absolute: 0.1 10*3/uL (ref 0.0–0.1)
Basophils Relative: 1 %
Eosinophils Absolute: 0.3 10*3/uL (ref 0.0–0.5)
Eosinophils Relative: 5 %
HCT: 39.8 % (ref 39.0–52.0)
Hemoglobin: 12.9 g/dL — ABNORMAL LOW (ref 13.0–17.0)
Immature Granulocytes: 0 %
Lymphocytes Relative: 21 %
Lymphs Abs: 1.1 10*3/uL (ref 0.7–4.0)
MCH: 28.5 pg (ref 26.0–34.0)
MCHC: 32.4 g/dL (ref 30.0–36.0)
MCV: 87.9 fL (ref 80.0–100.0)
Monocytes Absolute: 0.4 10*3/uL (ref 0.1–1.0)
Monocytes Relative: 7 %
Neutro Abs: 3.4 10*3/uL (ref 1.7–7.7)
Neutrophils Relative %: 66 %
Platelet Count: 177 10*3/uL (ref 150–400)
RBC: 4.53 MIL/uL (ref 4.22–5.81)
RDW: 15.5 % (ref 11.5–15.5)
WBC Count: 5.3 10*3/uL (ref 4.0–10.5)
nRBC: 0 % (ref 0.0–0.2)

## 2023-02-18 LAB — CMP (CANCER CENTER ONLY)
ALT: 15 U/L (ref 0–44)
AST: 21 U/L (ref 15–41)
Albumin: 4 g/dL (ref 3.5–5.0)
Alkaline Phosphatase: 68 U/L (ref 38–126)
Anion gap: 6 (ref 5–15)
BUN: 20 mg/dL (ref 6–20)
CO2: 24 mmol/L (ref 22–32)
Calcium: 9.5 mg/dL (ref 8.9–10.3)
Chloride: 106 mmol/L (ref 98–111)
Creatinine: 1.56 mg/dL — ABNORMAL HIGH (ref 0.61–1.24)
GFR, Estimated: 53 mL/min — ABNORMAL LOW (ref >60)
Glucose, Bld: 185 mg/dL — ABNORMAL HIGH (ref 70–99)
Potassium: 4.3 mmol/L (ref 3.5–5.1)
Sodium: 136 mmol/L (ref 135–145)
Total Bilirubin: 0.6 mg/dL (ref 0.3–1.2)
Total Protein: 7.2 g/dL (ref 6.5–8.1)

## 2023-02-18 MED ORDER — HEPARIN SOD (PORK) LOCK FLUSH 100 UNIT/ML IV SOLN
500.0000 [IU] | Freq: Once | INTRAVENOUS | Status: AC
  Administered 2023-02-18: 500 [IU]

## 2023-02-18 MED ORDER — SODIUM CHLORIDE 0.9% FLUSH
10.0000 mL | Freq: Once | INTRAVENOUS | Status: AC
  Administered 2023-02-18: 10 mL

## 2023-02-18 NOTE — Progress Notes (Signed)
[facility]   [phone] [phone]   Clinic Follow up Note   Patient Care Team: Bradd Canary, MD as PCP - General (Family Medicine) Christell Constant, MD as PCP - Cardiology (Cardiology) Malachy Mood, MD as Consulting Physician (Oncology) Pollyann Samples, NP as Nurse Practitioner (Oncology) Marily Lente, RN as Oncology Nurse Navigator (Oncology)  Date of Service:  02/18/2023  CHIEF COMPLAINT: f/u of rectal cancer     CURRENT THERAPY:    Adjuvant chemo FOLFOX x3 months starting 10/10/22   ASSESSMENT:  Robert [name] is a 52 y.o. male with   Rectal cancer (HCC) He presented with 1-1.5 years of incomplete bowel emptying and intermittent rectal bleeding, colonoscopy showed 5 polyps, internal hemorrhoids, and a biopsy-proven adenocarcinoma in the proximal rectum -Baseline CEA is normal. CT negative for distant metastasis -pT3N0M0 stage II -Initial staging MRI showed T1/T2, N0 rectal cancer and likely reactive inguinal nodes.  -rectal EUS showed uT2, uN0 disease, case reviewed in multidisciplinary conference and the recommendation was for upfront surgery -Proceeded with LAR and diverting ileostomy with Dr. Cliffton Asters on 08/23/2022. I reviewed surgical path which showed invasive grade 2 adenocarcinoma spanning 2.5 cm involving the rectum with carcinoma invading focally into the perirectal adipose tissue.  0/14 lymph nodes involved with clear margins and no LVI. Stage pT3 pN0, IIA, with no high risk features, MMR normal and MSI stable  -Case again discussed in conference and the recommendation is for 3 months adjuvant chemo. Dr. Mitzi Hansen felt that he could forego radiation -Began adjuvant FOLFOX 10/10/2022, s/p 4 cycles last given 11/21/22, cycle 5 held due to diarrhea and AKI which required hospital admission. -he completed 3 months adjuvant chemo on 01/02/2023 and has recovered well overall. -I personally reviewed his CT angio chest from Jan 01, 2023, which  was ordered by his cardiologist.  He has a new rounded consolidation opacity in the base of right lung, probable atelectasis versus infection, however given his history of rectal cancer, I recommend a PET scan to rule out cancer recurrence.  He is agreeable -He is scheduled for ostomy taking them in a month. -We discussed cancer surveillance  Peripheral neuropathy secondary to chemotherapy -Overall mild, no impact on his function.  I recommend him to take over-the-counter B 12 -No need of medical management at this point.  I encouraged him to exercise.   PLAN: -I recommend B12 supplement and exercise for his peripheral neuropathy - I order a PET whole body in 2 weeks, I will call him with results.   SUMMARY OF ONCOLOGIC HISTORY: Oncology History Overview Note   Cancer Staging  Rectal cancer [facility]) Staging form: Colon and Rectum, AJCC 8th Edition - Pathologic stage from 08/23/2022: Stage IIA (pT3, pN0, cM0) - Signed by Pollyann Samples, NP on 09/10/2022 Stage prefix: Initial diagnosis Total positive nodes: 0     Rectal cancer (HCC)  05/09/2022 Procedure   Colonoscopy by Dr. Russella Dar, impression - Five 5 to 7 mm polyps in the rectum, in the transverse colon and at the hepatic flexure, removed with a cold snare. Resected and retrieved. removed with a cold snare. Resected and retrieved. 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 Submucosal injection tattoo. - Internal hemorrhoids. - The examination was otherwise normal on direct and retroflexion views.   05/09/2022 Initial Biopsy   Diagnosis 1. Surgical [P], colon, transverse x1 and hepatic flexure x 3 (2 pieces only, 2 stuck together taken off together), polyp (4) - TUBULAR ADENOMA(S) -  NEGATIVE FOR HIGH-GRADE DYSPLASIA OR MALIGNANCY 2. Surgical [P], colon, rectal mass - INVASIVE MODERATELY DIFFERENTIATED ADENOCARCINOMA. SEE NOTE 3. Surgical [P], colon, rectum, polyp (1) - HYPERPLASTIC POLYP   05/09/2022 Tumor Marker   CEA <  2.0   05/17/2022 Imaging   IMPRESSION: 1. Known primary malignancy is not well visualized. Correlate with recent colonoscopy. 2. No evidence of metastatic disease in the chest, abdomen or pelvis. 3. Mildly enlarged bilateral inguinal lymph nodes, likely reactive. 4. 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 Recommend annual imaging followup by CTA or MRA. This recommendation follows 2010 ACCF/AHA/AATS/ACR/ASA/SCA/SCAI/SIR/STS/SVM Guidelines for the Diagnosis and Management of Patients with Thoracic Aortic Disease. Circulation. 2010; 121: U981-X914. 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 Severe coronary artery calcifications of the LAD and circumflex. 6. Aortic Atherosclerosis (ICD10-I70.0).   05/23/2022 Initial Diagnosis   Rectal cancer (HCC)   08/23/2022 Cancer Staging   Staging form: Colon and Rectum, AJCC 8th Edition - Pathologic stage from 08/23/2022: Stage IIA (pT3, pN0, cM0) - Signed by Pollyann Samples, NP on 09/10/2022 Stage prefix: Initial diagnosis Total positive nodes: 0   08/23/2022 Surgery   PROCEDURE:  Robotic assisted low anterior resection with diverting loop ileostomy SURGEON: Stephanie Coup. Cliffton Asters, MD ASSISTANT: Romie Levee, MD   08/23/2022 Pathology Results   FINAL MICROSCOPIC DIAGNOSIS:  A. COLON, RECTOSIGMOID, RESECTION:  - Invasive moderately differentiated adenocarcinoma, 2.5 cm, involving rectum  - Carcinoma invades focally into the perirectal adipose tissue  - Resection margins are negative for carcinoma  - Negative for lymphovascular invasion  - Fourteen lymph nodes, negative for carcinoma (0/14)  - See oncology table  B. DISTAL MARGIN DONUT:  - Rectal donut, negative for carcinoma   Regional Lymph Nodes:       Number of Lymph Nodes with Tumor: 0       Number of Lymph Nodes Examined: 14  Pathologic Stage Classification (pTNM, AJCC 8th Edition): pT3, pN0  Mismatch Repair Protein (IHC)  SUMMARY INTERPRETATION: NORMAL  MSI-Stable    09/27/2022 - 09/27/2022 Chemotherapy   Patient is on Treatment Plan : RECTAL Xelox (Capeox) (130/850) q21d x 6 cycles     10/10/2022 -  Chemotherapy   Patient is on Treatment Plan : COLORECTAL FOLFOX q14d x 3 months        INTERVAL HISTORY:  Robert [name] is here for a follow up of rectal cancer . He was last seen by me on 01/02/2023 He presents to the clinic alone. Pt state that he has recovered well from treatment, but he neuropathy is worse. Pt state that it doesn't impact his day to day. Pt state that it is manageable and tolerable.     All other systems were reviewed with the patient and are negative.  MEDICAL HISTORY:  Past Medical History:  Diagnosis Date   Arthralgia 01/11/2013   Diffuse, b/l Hips R>L Knees, ankles, low back   Arthritis    Back pain 12/08/2015   Cancer (HCC)    Coronary artery disease    Diabetes mellitus type 2 in obese 11/09/2012   Dyslipidemia 11/09/2012   Esophageal reflux 12/08/2015   Feeling grief 06/09/2020   History of migraine headaches    Hyperglycemia 11/09/2012   Hypertension     SURGICAL HISTORY: Past Surgical History:  Procedure Laterality Date   DIVERTING ILEOSTOMY N/A 08/23/2022   Procedure: DIVERTING LOOP ILEOSTOMY;  Surgeon: Andria Meuse, MD;  Location: [facility];  Service: General;  Laterality: N/A;   EUS N/A 06/21/2022   Procedure: LOWER  ENDOSCOPIC ULTRASOUND (EUS);  Surgeon: Lemar Lofty., MD;  Location: [facility];  Service: Gastroenterology;  Laterality: N/A;   FLEXIBLE SIGMOIDOSCOPY N/A 06/21/2022   Procedure: FLEXIBLE SIGMOIDOSCOPY;  Surgeon: Meridee Score Netty Starring., MD;  Location: [facility];  Service: Gastroenterology;  Laterality: N/A;   FLEXIBLE SIGMOIDOSCOPY N/A 08/23/2022   Procedure: FLEXIBLE SIGMOIDOSCOPY;  Surgeon: Andria Meuse, MD;  Location: [facility];  Service: General;  Laterality: N/A;   PORTACATH PLACEMENT N/A 09/21/2022   Procedure: INSERTION PORT-A-CATH WITH ULTRASOUND GUIDANCE;  Surgeon:  Andria Meuse, MD;  Location: [facility];  Service: General;  Laterality: N/A;   XI ROBOTIC ASSISTED LOWER ANTERIOR RESECTION N/A 08/23/2022   Procedure: XI ROBOTIC ASSISTED LOWER ANTERIOR RESECTION with Introperative assessment of profusion and TAP Block;  Surgeon: Andria Meuse, MD;  Location: [facility];  Service: General;  Laterality: N/A;    I have reviewed the social history and family history with the patient and they are unchanged from previous note.  ALLERGIES:  is allergic to advil [ibuprofen], aleve [naproxen], mucinex [guaifenesin er], nsaids, levaquin [levofloxacin], and latex.  MEDICATIONS:  Current Outpatient Medications  Medication Sig Dispense Refill   amLODipine (NORVASC) 5 MG tablet TAKE 1 TABLET (5 MG TOTAL) BY MOUTH DAILY. 90 tablet 1   carvedilol (COREG) 25 MG tablet Take 1 tablet (25 mg total) by mouth 2 (two) times daily. 180 tablet 3   glucose blood (COOL BLOOD GLUCOSE TEST STRIPS) test strip Check blood sugar once daily 100 each 12   lidocaine-prilocaine (EMLA) cream Apply 1 Application topically as needed (for port access).     metFORMIN (GLUCOPHAGE) 500 MG tablet Take 1 tablet (500 mg total) by mouth daily with breakfast. 90 tablet 1   ondansetron (ZOFRAN) 8 MG tablet Take 8 mg by mouth every 6 (six) hours as needed for nausea or vomiting.     pantoprazole (PROTONIX) 40 MG tablet Take 1 tablet (40 mg total) by mouth daily. 90 tablet 1   polycarbophil (FIBERCON) 625 MG tablet Take 1 tablet (625 mg total) by mouth daily. 30 tablet 2   prochlorperazine (COMPAZINE) 10 MG tablet Take 10 mg by mouth every 6 (six) hours as needed for nausea or vomiting.     sitaGLIPtin (JANUVIA) 100 MG tablet Take 1 tablet (100 mg total) by mouth daily. 30 tablet 4   tiZANidine (ZANAFLEX) 2 MG tablet Take 1-2 tablets (2-4 mg total) by mouth 3 (three) times daily as needed for muscle spasms. 60 tablet 2   No current facility-administered medications for this visit.    PHYSICAL  EXAMINATION: ECOG PERFORMANCE STATUS: 0 - Asymptomatic  Vitals:   02/18/23 1147  BP: 123/78  Pulse: 66  Resp: 15  Temp: 99 F (37.2 C)  SpO2: 98%   Wt Readings from Last 3 Encounters:  02/18/23 253 lb 14.4 oz (115.2 kg)  02/13/23 250 lb (113.4 kg)  02/05/23 250 lb 3.2 oz (113.5 kg)     GENERAL:alert, no distress and comfortable SKIN: skin color normal, no rashes or significant lesions EYES: normal, Conjunctiva are pink and non-injected, sclera clear  NEURO: alert & oriented x 3 with fluent speech LUNGS: (-)clear to auscultation and percussion with normal breathing effort  LABORATORY DATA:  I have reviewed the data as listed    Latest Ref Rng & Units 02/18/2023   11:22 AM 02/05/2023    5:16 PM 01/02/2023   10:04 AM  CBC  WBC 4.0 - 10.5 K/uL 5.3  6.7  5.7   Hemoglobin  13.0 - 17.0 g/dL 16.1  09.6  04.5   Hematocrit 39.0 - 52.0 % 39.8  41.5  36.3   Platelets 150 - 400 K/uL 177  190.0  130         Latest Ref Rng & Units 02/18/2023   11:22 AM 02/05/2023    5:16 PM 01/02/2023   10:04 AM  CMP  Glucose 70 - 99 mg/dL 409  811  914   BUN 6 - 20 mg/dL 20  18  19    Creatinine 0.61 - 1.24 mg/dL 7.82  9.56  2.13   Sodium 135 - 145 mmol/L 136  137  139   Potassium 3.5 - 5.1 mmol/L 4.3  3.8  4.1   Chloride 98 - 111 mmol/L 106  102  103   CO2 22 - 32 mmol/L 24  23  29    Calcium 8.9 - 10.3 mg/dL 9.5  9.9  9.5   Total Protein 6.5 - 8.1 g/dL 7.2  7.4  7.2   Total Bilirubin 0.3 - 1.2 mg/dL 0.6  0.7  0.5   Alkaline Phos 38 - 126 U/L 68  55  69   AST 15 - 41 U/L 21  26  25    ALT 0 - 44 U/L 15  18  19        RADIOGRAPHIC STUDIES: I have personally reviewed the radiological images as listed and agreed with the findings in the report. No results found.    Orders Placed This Encounter  Procedures   NM PET Image Restage (PS) Whole Body    Standing Status:   Future    Standing Expiration Date:   02/18/2024    Order Specific Question:   If indicated for the ordered procedure, I  authorize the administration of a radiopharmaceutical per Radiology protocol    Answer:   Yes    Order Specific Question:   Preferred imaging location?    Answer:   [facility]    Order Specific Question:   Release to patient    Answer:   Immediate   All questions were answered. The patient knows to call the clinic with any problems, questions or concerns. No barriers to learning was detected. The total time spent in the appointment was 25 minutes.     Malachy Mood, MD 02/18/2023   Carolin Coy, CMA, am acting as scribe for Malachy Mood, MD.   I have reviewed the above documentation for accuracy and completeness, and I agree with the above.

## 2023-02-19 ENCOUNTER — Telehealth: Payer: Self-pay | Admitting: Hematology

## 2023-02-19 ENCOUNTER — Other Ambulatory Visit: Payer: Self-pay

## 2023-02-19 DIAGNOSIS — C20 Malignant neoplasm of rectum: Secondary | ICD-10-CM

## 2023-02-21 ENCOUNTER — Other Ambulatory Visit: Payer: Self-pay | Admitting: Family Medicine

## 2023-03-05 ENCOUNTER — Encounter: Payer: Self-pay | Admitting: Gastroenterology

## 2023-03-05 ENCOUNTER — Ambulatory Visit: Payer: Self-pay | Admitting: Surgery

## 2023-03-05 ENCOUNTER — Ambulatory Visit (INDEPENDENT_AMBULATORY_CARE_PROVIDER_SITE_OTHER): Payer: BLUE CROSS/BLUE SHIELD | Admitting: Gastroenterology

## 2023-03-05 VITALS — BP 120/80 | HR 73 | Ht 75.0 in | Wt 255.8 lb

## 2023-03-05 DIAGNOSIS — K219 Gastro-esophageal reflux disease without esophagitis: Secondary | ICD-10-CM | POA: Diagnosis not present

## 2023-03-05 DIAGNOSIS — C20 Malignant neoplasm of rectum: Secondary | ICD-10-CM | POA: Diagnosis not present

## 2023-03-05 MED ORDER — FAMOTIDINE 40 MG PO TABS: 40.0000 mg | ORAL_TABLET | Freq: Every day | ORAL | 3 refills | Status: AC

## 2023-03-05 MED ORDER — NA SULFATE-K SULFATE-MG SULF 17.5-3.13-1.6 GM/177ML PO SOLN: 1.0000 | Freq: Once | ORAL | 0 refills | Status: AC

## 2023-03-05 NOTE — Patient Instructions (Addendum)
You have been scheduled for an endoscopy and colonoscopy. Please follow the written instructions given to you at your visit today.  Please pick up your prep supplies at the pharmacy within the next 1-3 days.  If you use inhalers (even only as needed), please bring them with you on the day of your procedure.  DO NOT TAKE 7 DAYS PRIOR TO TEST- Trulicity (dulaglutide) Ozempic, Wegovy (semaglutide) Mounjaro (tirzepatide) Bydureon Bcise (exanatide extended release)  DO NOT TAKE 1 DAY PRIOR TO YOUR TEST Rybelsus (semaglutide) Adlyxin (lixisenatide) Victoza (liraglutide) Byetta (exanatide) ___________________________________________________________________________   _ X _   ORAL DIABETIC MEDICATION INSTRUCTIONS  The day before your procedure:  Take your diabetic pill as you do normally  The day of your procedure:  Do not take your diabetic pill   We will check your blood sugar levels during the admission process and again in Recovery before discharging you home   _______________________________________________________  Continue Pantoprazole  If your blood pressure at your visit was 140/90 or greater, please contact your primary care physician to follow up on this.  _______________________________________________________  If you are age 54 or older, your body mass index should be between 23-30. Your Body mass index is 31.97 kg/m. If this is out of the aforementioned range listed, please consider follow up with your Primary Care Provider.  If you are age 13 or younger, your body mass index should be between 19-25. Your Body mass index is 31.97 kg/m. If this is out of the aformentioned range listed, please consider follow up with your Primary Care Provider.   ________________________________________________________  The [facility] providers would like to encourage you to use [facility] to communicate with providers for non-urgent requests or questions.  Due to long hold times on the  telephone, sending your provider a message by [facility] may be a faster and more efficient way to get a response.  Please allow 48 business hours for a response.  Please remember that this is for non-urgent requests.  _______________________________________________________   We have sent the following medications to your pharmacy for you to pick up at your convenience: Famotidine   I appreciate the  opportunity to care for you  Thank You   Judie Petit Stark,MD

## 2023-03-05 NOTE — Progress Notes (Signed)
Assessment     Rectal cancer, T3 N0 M0, Stage II, S/P LAR and diverting ileostomy in 08/2022, recently completed FOLFOX Ileostomy takedown and flexible sigmoidoscopy planned for March 20, 2023 GERD - R/O esophagitis, Barrett's   Recommendations    Schedule colonoscopy and EGD in October after recovery from ileostomy takedown. The risks (including bleeding, perforation, infection, missed lesions, medication reactions and possible hospitalization or surgery if complications occur), benefits, and alternatives to colonoscopy with possible biopsy and possible polypectomy were discussed with the patient and they consent to proceed.  The risks (including bleeding, perforation, infection, missed lesions, medication reactions and possible hospitalization or surgery if complications occur), benefits, and alternatives to endoscopy with possible biopsy and possible dilation were discussed with the patient and they consent to proceed.   Continue pantoprazole to 40 mg qd, start famotidine 40 mg hs and follow antireflux measures.   HPI    This is a 52 year old male with rectal cancer, S/P LAR and diverting ileostomy. He recently completed FOLFOX.  He is followed by Dr. Mosetta Putt and Dr. Cliffton Asters.  Dr. Cliffton Asters plans a takedown of his loop ileostomy with a flexible sigmoidoscopy to assess his anastomosis on August 7. His reflux symptoms have worsened over the past few months. He has noted intact pantoprazole pills in his ostomy bag until changing to evening doses. Denies weight loss, abdominal pain, constipation, diarrhea, change in stool caliber, melena, hematochezia, nausea, vomiting, dysphagia, chest pain.    Labs / Imaging       Latest Ref Rng & Units 02/18/2023   11:22 AM 02/05/2023    5:16 PM 01/02/2023   10:04 AM  Hepatic Function  Total Protein 6.5 - 8.1 g/dL 7.2  7.4  7.2   Albumin 3.5 - 5.0 g/dL 4.0  4.3  4.1   AST 15 - 41 U/L 21  26  25    ALT 0 - 44 U/L 15  18  19    Alk Phosphatase 38 - 126 U/L 68  55   69   Total Bilirubin 0.3 - 1.2 mg/dL 0.6  0.7  0.5        Latest Ref Rng & Units 02/18/2023   11:22 AM 02/05/2023    5:16 PM 01/02/2023   10:04 AM  CBC  WBC 4.0 - 10.5 K/uL 5.3  6.7  5.7   Hemoglobin 13.0 - 17.0 g/dL 16.1  09.6  04.5   Hematocrit 39.0 - 52.0 % 39.8  41.5  36.3   Platelets 150 - 400 K/uL 177  190.0  130      ECHOCARDIOGRAM COMPLETE    ECHOCARDIOGRAM REPORT       Patient Name:   BAYLOR CORTEZ Date of Exam: 01/17/2023 Medical Rec #:  [id number]    Height:       75.0 in Accession #:    [id number]   Weight:       254.3 lb Date of Birth:  May 14, 1971    BSA:          2.430 m Patient Age:    51 years     BP:           117/71 mmHg Patient Gender: M            HR:           64 bpm. Exam Location:  Outpatient  Procedure: 2D Echo, Cardiac Doppler, Color Doppler, 3D Echo and Strain Analysis  Indications:    Aortic valve disorder I35.9  History:        Patient has prior history of Echocardiogram examinations, most                 recent 07/13/2022. CAD; Risk Factors:Hypertension, Dyslipidemia                 and Diabetes. Ascending aortic aneurysm.   Sonographer:    Eulah Pont RDCS Referring Phys: [id number] Alleen Borne    Sonographer Comments: Global longitudinal strain was attempted. IMPRESSIONS   1. Left ventricular ejection fraction, by estimation, is 60 to 65%. Left ventricular ejection fraction by 3D volume is 60 %. The left ventricle has normal function. The left ventricle has no regional wall motion abnormalities. The left ventricular  internal cavity size was mildly dilated. There is mild left ventricular hypertrophy. Left ventricular diastolic parameters are consistent with Grade I diastolic dysfunction (impaired relaxation). The average left ventricular global longitudinal strain is  -18.3 %. The global longitudinal strain is normal.  2. Right ventricular systolic function is normal. The right ventricular size is normal.  3. The mitral valve is normal in  structure. No evidence of mitral valve regurgitation. No evidence of mitral stenosis.  4. The aortic valve is tricuspid. Aortic valve regurgitation is moderate. No aortic stenosis is present. Aortic regurgitation PHT measures 604 msec.  5. Aneurysm of the aortic root, measuring 48 mm. Aneurysm of the ascending aorta, measuring 49 mm. There is moderate dilatation of the aortic root, measuring 48 mm.  6. The inferior vena cava is normal in size with greater than 50% respiratory variability, suggesting right atrial pressure of 3 mmHg.  Comparison(s): No significant change from prior study. Prior images reviewed side by side.  FINDINGS  Left Ventricle: Left ventricular ejection fraction, by estimation, is 60 to 65%. Left ventricular ejection fraction by 3D volume is 60 %. The left ventricle has normal function. The left ventricle has no regional wall motion abnormalities. The average  left ventricular global longitudinal strain is -18.3 %. The global longitudinal strain is normal. The left ventricular internal cavity size was mildly dilated. There is mild left ventricular hypertrophy. Left ventricular diastolic parameters are  consistent with Grade I diastolic dysfunction (impaired relaxation).  Right Ventricle: The right ventricular size is normal. No increase in right ventricular wall thickness. Right ventricular systolic function is normal.  Left Atrium: Left atrial size was normal in size.  Right Atrium: Right atrial size was normal in size.  Pericardium: There is no evidence of pericardial effusion.  Mitral Valve: The mitral valve is normal in structure. No evidence of mitral valve regurgitation. No evidence of mitral valve stenosis.  Tricuspid Valve: The tricuspid valve is normal in structure. Tricuspid valve regurgitation is not demonstrated. No evidence of tricuspid stenosis.  Aortic Valve: The aortic valve is tricuspid. Aortic valve regurgitation is moderate. Aortic regurgitation PHT  measures 604 msec. No aortic stenosis is present. Aortic valve mean gradient measures 3.0 mmHg. Aortic valve peak gradient measures 6.1 mmHg.  Aortic valve area, by VTI measures 3.88 cm.  Pulmonic Valve: The pulmonic valve was normal in structure. Pulmonic valve regurgitation is trivial. No evidence of pulmonic stenosis.  Aorta: The aortic root is normal in size and structure. There is moderate dilatation of the aortic root, measuring 48 mm. There is an aneurysm involving the aortic root measuring 48 mm. There is an aneurysm involving the ascending aorta measuring 49 mm.  Venous: The inferior vena cava is normal in size with greater than 50% respiratory  variability, suggesting right atrial pressure of 3 mmHg.  IAS/Shunts: No atrial level shunt detected by color flow Doppler.    LEFT VENTRICLE PLAX 2D LVIDd:         6.00 cm         Diastology LVIDs:         3.30 cm         LV e' medial:    4.95 cm/s LV PW:         1.30 cm         LV E/e' medial:  11.9 LV IVS:        1.30 cm         LV e' lateral:   5.36 cm/s LVOT diam:     2.40 cm         LV E/e' lateral: 11.0 LV SV:         115 LV SV Index:   47              2D LVOT Area:     4.52 cm        Longitudinal                                Strain                                2D Strain GLS  -18.3 % LV Volumes (MOD)               Avg: LV vol d, MOD    151.0 ml A2C:                           3D Volume EF LV vol d, MOD    163.0 ml      LV 3D EF:    Left A4C:                                        ventricul LV vol s, MOD    63.1 ml                    ar A2C:                                        ejection LV vol s, MOD    58.0 ml                    fraction A4C:                                        by 3D LV SV MOD A2C:   87.9 ml                    volume is LV SV MOD A4C:   163.0 ml                   60 %. LV SV MOD BP:    96.4 ml  3D Volume EF:                                3D EF:        60 %                                 LV EDV:       226 ml                                LV ESV:       90 ml                                LV SV:        136 ml  RIGHT VENTRICLE RV S prime:     14.80 cm/s TAPSE (M-mode): 2.3 cm  LEFT ATRIUM             Index        RIGHT ATRIUM           Index LA diam:        4.50 cm 1.85 cm/m   RA Area:     16.80 cm LA Vol (A2C):   73.9 ml 30.41 ml/m  RA Volume:   34.80 ml  14.32 ml/m LA Vol (A4C):   76.6 ml 31.52 ml/m LA Biplane Vol: 76.5 ml 31.48 ml/m  AORTIC VALVE                    PULMONIC VALVE AV Area (Vmax):    4.56 cm     PR End Diast Vel: 3.71 msec AV Area (Vmean):   4.53 cm AV Area (VTI):     3.88 cm AV Vmax:           123.00 cm/s AV Vmean:          83.800 cm/s AV VTI:            0.297 m AV Peak Grad:      6.1 mmHg AV Mean Grad:      3.0 mmHg LVOT Vmax:         124.00 cm/s LVOT Vmean:        83.900 cm/s LVOT VTI:          0.255 m LVOT/AV VTI ratio: 0.86 AI PHT:            604 msec AR Vena Contracta: 0.40 cm   AORTA Ao Root diam: 4.75 cm Ao Asc diam:  4.90 cm  MITRAL VALVE MV Area (PHT): 2.42 cm    SHUNTS MV Decel Time: 313 msec    Systemic VTI:  0.26 m MV E velocity: 58.80 cm/s  Systemic Diam: 2.40 cm MV A velocity: 69.50 cm/s MV E/A ratio:  0.85  Donato Schultz MD Electronically signed by Donato Schultz MD Signature Date/Time: 01/17/2023/2:45:55 PM      Final     Current Medications, Allergies, Past Medical History, Past Surgical History, Family History and Social History were reviewed in [employer] record.   Physical Exam: General: Well developed, well nourished, no acute distress Head: Normocephalic and atraumatic Eyes: Sclerae anicteric, EOMI Ears: Normal auditory acuity Mouth: No deformities or lesions noted Lungs: Clear throughout to auscultation  Heart: Regular rate and rhythm; No murmurs, rubs or bruits Abdomen: Soft, non tender and non distended. Ileostomy with bag. No masses,  hepatosplenomegaly or hernias noted. Normal Bowel sounds Rectal: Deferred to colonoscopy Musculoskeletal: Symmetrical with no gross deformities  Pulses:  Normal pulses noted Extremities: No edema or deformities noted Neurological: Alert oriented x 4, grossly nonfocal Psychological:  Alert and cooperative. Normal mood and affect   Hampton Cost T. Russella Dar, MD 03/05/2023, 10:57 AM

## 2023-03-05 NOTE — Progress Notes (Signed)
Sent message, via epic in basket, requesting orders in epic from surgeon.  

## 2023-03-06 NOTE — Progress Notes (Addendum)
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     At preop pt stated he had not received bowel prep instrucitons from office.  In epic in orders it states clear lqiuid diet on day of bowel prep.  Called [facility] Triage and spoke with Joni Reining and she stated no bowel prep needed but to go arehad with ERAS and drink.  PT given those instructions and voiced understanding.     PT to bring ostomy supplies DOS.

## 2023-03-08 NOTE — Patient Instructions (Signed)
SURGICAL WAITING ROOM VISITATION  Patients having surgery or a procedure may have no more than 2 support people in the waiting area - these visitors may rotate.    Children under the age of 30 must have an adult with them who is not the patient.  Due to an increase in RSV and influenza rates and associated hospitalizations, children ages 26 and under may not visit patients in [facility] hospitals.  If the patient needs to stay at the hospital during part of their recovery, the visitor guidelines for inpatient rooms apply. Pre-op nurse will coordinate an appropriate time for 1 support person to accompany patient in pre-op.  This support person may not rotate.    Please refer to the [facility] website for the visitor guidelines for Inpatients (after your surgery is over and you are in a regular room).       Your procedure is scheduled on:  03/20/2023    Report to [facility] Main Entrance    Report to admitting at   [phone]   Call this number if you have problems the morning of surgery [phone] Clear liquid diet on day of bowel prep.            Follow bowel prep instructions.     After Midnight you may have the following liquids until __ 0530____ AM DAY OF SURGERY  Water Non-Citrus Juices (without pulp, NO RED-Apple, White grape, White cranberry) Black Coffee (NO MILK/CREAM OR CREAMERS, sugar ok)  Clear Tea (NO MILK/CREAM OR CREAMERS, sugar ok) regular and decaf                             Plain Jell-O (NO RED)                                           Fruit ices (not with fruit pulp, NO RED)                                     Popsicles (NO RED)                                                               Sports drinks like Gatorade (NO RED)              Drink 2 Ensure/G2 drinks AT 10:00 PM the night before surgery.        The day of surgery:  Drink ONE (1) Pre-Surgery Clear Ensure or G2 at 0530 AM  ( have completed by ) the morning of surgery. Drink in one  sitting. Do not sip.  This drink was given to you during your hospital  pre-op appointment visit. Nothing else to drink after completing the  Pre-Surgery Clear Ensure or G2.          If you have questions, please contact your surgeon's office.   FOLLOW BOWEL PREP AND ANY ADDITIONAL PRE OP INSTRUCTIONS YOU RECEIVED FROM YOUR SURGEON'S OFFICE!!!     Oral Hygiene is also important to reduce your risk of infection.  Remember - BRUSH YOUR TEETH THE MORNING OF SURGERY WITH YOUR REGULAR TOOTHPASTE  DENTURES WILL BE REMOVED PRIOR TO SURGERY PLEASE DO NOT APPLY "Poly grip" OR ADHESIVES!!!   Do NOT smoke after Midnight   Stop all vitamins and herbal supplements 7 days before surgery.   Take these medicines the morning of surgery with A SIP OF WATER:  amlodipine if needed, coreg, protonix             Metformin- none day of surgery            Januvia- None day of surgery   DO NOT TAKE ANY ORAL DIABETIC MEDICATIONS DAY OF YOUR SURGERY  Bring CPAP mask and tubing day of surgery.                              You may not have any metal on your body including hair pins, jewelry, and body piercing             Do not wear make-up, lotions, powders, perfumes/cologne, or deodorant  Do not wear nail polish including gel and S&S, artificial/acrylic nails, or any other type of covering on natural nails including finger and toenails. If you have artificial nails, gel coating, etc. that needs to be removed by a nail salon please have this removed prior to surgery or surgery may need to be canceled/ delayed if the surgeon/ anesthesia feels like they are unable to be safely monitored.   Do not shave  48 hours prior to surgery.               Men may shave face and neck.   Do not bring valuables to the hospital. [place] IS NOT             RESPONSIBLE   FOR VALUABLES.   Contacts, glasses, dentures or bridgework may not be worn into surgery.   Bring small overnight  bag day of surgery.   DO NOT BRING YOUR HOME MEDICATIONS TO THE HOSPITAL. PHARMACY WILL DISPENSE MEDICATIONS LISTED ON YOUR MEDICATION LIST TO YOU DURING YOUR ADMISSION IN THE HOSPITAL!    Patients discharged on the day of surgery will not be allowed to drive home.  Someone NEEDS to stay with you for the first 24 hours after anesthesia.   Special Instructions: Bring a copy of your healthcare power of attorney and living will documents the day of surgery if you haven't scanned them before.              Please read over the following fact sheets you were given: IF YOU HAVE QUESTIONS ABOUT YOUR PRE-OP INSTRUCTIONS PLEASE CALL [phone]   If you received a COVID test during your pre-op visit  it is requested that you wear a mask when out in public, stay away from anyone that may not be feeling well and notify your surgeon if you develop symptoms. If you test positive for Covid or have been in contact with anyone that has tested positive in the last 10 days please notify you surgeon.    [place] - Preparing for Surgery Before surgery, you can play an important role.  Because skin is not sterile, your skin needs to be as free of germs as possible.  You can reduce the number of germs on your skin by washing with CHG (chlorahexidine gluconate) soap before surgery.  CHG is an antiseptic cleaner which kills germs and bonds with the skin  to continue killing germs even after washing. Please DO NOT use if you have an allergy to CHG or antibacterial soaps.  If your skin becomes reddened/irritated stop using the CHG and inform your nurse when you arrive at Short Stay. Do not shave (including legs and underarms) for at least 48 hours prior to the first CHG shower.  You may shave your face/neck. Please follow these instructions carefully:  1.  Shower with CHG Soap the night before surgery and the  morning of Surgery.  2.  If you choose to wash your hair, wash your hair first as usual with your  normal   shampoo.  3.  After you shampoo, rinse your hair and body thoroughly to remove the  shampoo.                           4.  Use CHG as you would any other liquid soap.  You can apply chg directly  to the skin and wash                       Gently with a scrungie or clean washcloth.  5.  Apply the CHG Soap to your body ONLY FROM THE NECK DOWN.   Do not use on face/ open                           Wound or open sores. Avoid contact with eyes, ears mouth and genitals (private parts).                       Wash face,  Genitals (private parts) with your normal soap.             6.  Wash thoroughly, paying special attention to the area where your surgery  will be performed.  7.  Thoroughly rinse your body with warm water from the neck down.  8.  DO NOT shower/wash with your normal soap after using and rinsing off  the CHG Soap.                9.  Pat yourself dry with a clean towel.            10.  Wear clean pajamas.            11.  Place clean sheets on your bed the night of your first shower and do not  sleep with pets. Day of Surgery : Do not apply any lotions/deodorants the morning of surgery.  Please wear clean clothes to the hospital/surgery center.  FAILURE TO FOLLOW THESE INSTRUCTIONS MAY RESULT IN THE CANCELLATION OF YOUR SURGERY PATIENT SIGNATURE_________________________________  NURSE SIGNATURE__________________________________  ________________________________________________________________________

## 2023-03-11 ENCOUNTER — Encounter (HOSPITAL_COMMUNITY): Payer: BLUE CROSS/BLUE SHIELD

## 2023-03-11 ENCOUNTER — Ambulatory Visit (HOSPITAL_COMMUNITY)
Admission: RE | Admit: 2023-03-11 | Discharge: 2023-03-11 | Disposition: A | Discharge status: Home / Self Care | Payer: BLUE CROSS/BLUE SHIELD | Source: Ambulatory Visit | Attending: Hematology | Admitting: Hematology

## 2023-03-11 DIAGNOSIS — C20 Malignant neoplasm of rectum: Secondary | ICD-10-CM | POA: Diagnosis present

## 2023-03-11 LAB — GLUCOSE, CAPILLARY: Glucose-Capillary: 115 mg/dL — ABNORMAL HIGH (ref 70–99)

## 2023-03-11 MED ORDER — FLUDEOXYGLUCOSE F - 18 (FDG) INJECTION
12.7400 | Freq: Once | INTRAVENOUS | Status: AC
  Administered 2023-03-11: 12.74 via INTRAVENOUS

## 2023-03-12 ENCOUNTER — Encounter (HOSPITAL_COMMUNITY)
Admission: RE | Admit: 2023-03-12 | Discharge: 2023-03-12 | Disposition: A | Discharge status: Home / Self Care | Payer: BLUE CROSS/BLUE SHIELD | Source: Ambulatory Visit | Attending: Surgery | Admitting: Surgery

## 2023-03-12 ENCOUNTER — Other Ambulatory Visit: Payer: Self-pay

## 2023-03-12 ENCOUNTER — Encounter (HOSPITAL_COMMUNITY): Payer: Self-pay

## 2023-03-12 VITALS — BP 141/90 | HR 67 | Temp 98.2°F | Resp 16 | Ht 72.0 in | Wt 244.0 lb

## 2023-03-12 DIAGNOSIS — C20 Malignant neoplasm of rectum: Secondary | ICD-10-CM | POA: Insufficient documentation

## 2023-03-12 DIAGNOSIS — I10 Essential (primary) hypertension: Secondary | ICD-10-CM | POA: Diagnosis not present

## 2023-03-12 DIAGNOSIS — Z01812 Encounter for preprocedural laboratory examination: Secondary | ICD-10-CM | POA: Insufficient documentation

## 2023-03-12 DIAGNOSIS — Q231 Congenital insufficiency of aortic valve: Secondary | ICD-10-CM | POA: Insufficient documentation

## 2023-03-12 DIAGNOSIS — I2510 Atherosclerotic heart disease of native coronary artery without angina pectoris: Secondary | ICD-10-CM | POA: Diagnosis not present

## 2023-03-12 DIAGNOSIS — Z01818 Encounter for other preprocedural examination: Secondary | ICD-10-CM

## 2023-03-12 DIAGNOSIS — Z7984 Long term (current) use of oral hypoglycemic drugs: Secondary | ICD-10-CM | POA: Diagnosis not present

## 2023-03-12 DIAGNOSIS — E119 Type 2 diabetes mellitus without complications: Secondary | ICD-10-CM | POA: Insufficient documentation

## 2023-03-12 DIAGNOSIS — I7121 Aneurysm of the ascending aorta, without rupture: Secondary | ICD-10-CM | POA: Diagnosis not present

## 2023-03-12 DIAGNOSIS — Z87891 Personal history of nicotine dependence: Secondary | ICD-10-CM | POA: Diagnosis not present

## 2023-03-12 DIAGNOSIS — I340 Nonrheumatic mitral (valve) insufficiency: Secondary | ICD-10-CM | POA: Diagnosis not present

## 2023-03-12 LAB — TYPE AND SCREEN
ABO/RH(D): A POS
Antibody Screen: NEGATIVE

## 2023-03-12 LAB — BASIC METABOLIC PANEL
Anion gap: 7 (ref 5–15)
BUN: 17 mg/dL (ref 6–20)
CO2: 22 mmol/L (ref 22–32)
Calcium: 9 mg/dL (ref 8.9–10.3)
Chloride: 107 mmol/L (ref 98–111)
Creatinine, Ser: 1.47 mg/dL — ABNORMAL HIGH (ref 0.61–1.24)
GFR, Estimated: 56 mL/min — ABNORMAL LOW (ref >60)
Glucose, Bld: 164 mg/dL — ABNORMAL HIGH (ref 70–99)
Potassium: 3.9 mmol/L (ref 3.5–5.1)
Sodium: 136 mmol/L (ref 135–145)

## 2023-03-12 LAB — CBC
HCT: 41.1 % (ref 39.0–52.0)
Hemoglobin: 12.7 g/dL — ABNORMAL LOW (ref 13.0–17.0)
MCH: 27.1 pg (ref 26.0–34.0)
MCHC: 30.9 g/dL (ref 30.0–36.0)
MCV: 87.8 fL (ref 80.0–100.0)
Platelets: 179 10*3/uL (ref 150–400)
RBC: 4.68 MIL/uL (ref 4.22–5.81)
RDW: 14.6 % (ref 11.5–15.5)
WBC: 5.3 10*3/uL (ref 4.0–10.5)
nRBC: 0 % (ref 0.0–0.2)

## 2023-03-12 LAB — GLUCOSE, CAPILLARY: Glucose-Capillary: 175 mg/dL — ABNORMAL HIGH (ref 70–99)

## 2023-03-13 NOTE — Progress Notes (Signed)
Anesthesia Chart Review   Case: [id number] Date/Time: 03/20/23 0815   Procedures:      TAKEDOWN OF DIVERTING LOOP ILEOSTOMY     DIAGNOSTIC FLEXIBLE SIGMOIDOSCOPY   Anesthesia type: General   Pre-op diagnosis: RECTAL CANCER   Location: [facility] ROOM 02 / [facility]   Surgeons: Andria Meuse, MD       DISCUSSION:52 y.o. former smoker with h/o HTN, DM II, CAD, aortic aneurysm, moderate aortic valve regurgitation, rectal cancer scheduled for above procedure 03/20/2023 with Dr. Marin Olp.   Pt seen by CT surgery 07/30/2022. 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  He is a 6\' 3"  tall gentleman so these measurements are not quite as significant as if he were a shorter small frame person. I have personally reviewed his 2D echocardiogram, CT scan of the chest, abdomen, and pelvis from October 2023 and his current CTA of the chest from 07/27/2022.  I do not think the aorta has changed compared to his CT scan of the chest, abdomen, and pelvis in October 2023.  The radiologist just did not comment on the aortic root at that time.  In general the threshold for surgery is 5.5 cm.  He has a trileaflet aortic valve with moderate asymptomatic aortic insufficiency, normal left ventricular systolic function and an LV diastolic diameter of 6.2 cm with a systolic diameter 3.7 cm.  There is no indication for replacement of his aortic valve and aortic aneurysm at this time.  The risk of aortic dissection is less than 0.5% and I stressed the importance of continued good blood pressure control in preventing further enlargement and acute aortic dissection.  I do not think there is any contraindication to proceeding with his cancer surgery which is a priority at this time.  He will require a CTA of the chest/aorta with gating and a 2D echocardiogram in 6 months and I will arrange those studies with follow-up  with me at that time.  I reviewed all of the CT scan and echo images with the patient and his wife and answered their questions. "   Echo 01/17/2023, no significant change from prior study.   Pt last seen by cardiology 12/24/2022. Per OV note, "Severe Aortic Aneurysm  -Bicuspid Aortic Valve Siever's 1 Bicuspid Suspected - Moderate aortic regurgitation - based on current guidelines he is recommneded to repeat CT scan in September (gated CT aorta), repeat echocardiogram 10/2023 and if evidence of LV dilation or dysfunction or if severe aortic dilation to have surgical consideration (no family history of dissection - will reach our to Drs. Corinna Gab, Ames and Ritchie; if no concerns I would recommend proceeding with surgery"  Follow up with cardiothoracic surgeon Dr. Laneta Simmers 02/13/2023. Per OV note, "I have personally reviewed his 2D echocardiogram and CTA studies.  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  The measurement from, measure to opposite sinus was 4.8 to 4.9 cm.  The sinotubular junction measured 5.0 cm and the ascending aorta about 4.9 cm.  He has a trileaflet aortic valve with moderate aortic insufficiency and mild LV dilatation at 6 cm by echocardiogram and 6.8 cm by MRA.  I think his aneurysm and aortic insufficiency are stable.  The measurements are still below the strict surgical threshold but he is likely require surgery in the future.  I think the most important thing  at this point is to proceed with ileostomy reversal and allow him to completely recover from that and be sure that he does not develop recurrent cancer.  I have recommended that we continue to follow his aortic aneurysm and aortic insufficiency at this time."  VS: BP (!) 141/90   Pulse 67   Temp 36.8 C (Oral)   Resp 16   Ht 6' (1.829 m)   Wt 110.7 kg   SpO2 97%   BMI 33.09 kg/m   PROVIDERS: Bradd Canary, MD is PCP   Sheran Spine, MD is Cardiologist   Evelene Croon, MD  is Cardiothoracic Surgeon LABS: Labs reviewed: Acceptable for surgery. (all labs ordered are listed, but only abnormal results are displayed)  Labs Reviewed  BASIC METABOLIC PANEL - Abnormal; Notable for the following components:      Result Value   Glucose, Bld 164 (*)    Creatinine, Ser 1.47 (*)    GFR, Estimated 56 (*)    All other components within normal limits  CBC - Abnormal; Notable for the following components:   Hemoglobin 12.7 (*)    All other components within normal limits  HEMOGLOBIN A1C - Abnormal; Notable for the following components:   Hgb A1c MFr Bld 6.4 (*)    All other components within normal limits  GLUCOSE, CAPILLARY - Abnormal; Notable for the following components:   Glucose-Capillary 175 (*)    All other components within normal limits  TYPE AND SCREEN     IMAGES:   EKG:   CV: Echo 01/17/2023  1. Left ventricular ejection fraction, by estimation, is 60 to 65%. Left  ventricular ejection fraction by 3D volume is 60 %. The left ventricle has  normal function. The left ventricle has no regional wall motion  abnormalities. The left ventricular  internal cavity size was mildly dilated. There is mild left ventricular  hypertrophy. Left ventricular diastolic parameters are consistent with  Grade I diastolic dysfunction (impaired relaxation). The average left  ventricular global longitudinal strain is   -18.3 %. The global longitudinal strain is normal.   2. Right ventricular systolic function is normal. The right ventricular  size is normal.   3. The mitral valve is normal in structure. No evidence of mitral valve  regurgitation. No evidence of mitral stenosis.   4. The aortic valve is tricuspid. Aortic valve regurgitation is moderate.  No aortic stenosis is present. Aortic regurgitation PHT measures 604 msec.   5. Aneurysm of the aortic root, measuring 48 mm. Aneurysm of the  ascending aorta, measuring 49 mm. There is moderate dilatation of the   aortic root, measuring 48 mm.   6. The inferior vena cava is normal in size with greater than 50%  respiratory variability, suggesting right atrial pressure of 3 mmHg.   Comparison(s): No significant change from prior study. Prior images  reviewed side by side.  Past Medical History:  Diagnosis Date   Arthralgia 01/11/2013   Diffuse, b/l Hips R>L Knees, ankles, low back   Arthritis    Back pain 12/08/2015   Cancer (HCC)    Coronary artery disease    Diabetes mellitus type 2 in obese 11/09/2012   Dyslipidemia 11/09/2012   Esophageal reflux 12/08/2015   Feeling grief 06/09/2020   History of migraine headaches    Hyperglycemia 11/09/2012   Hypertension     Past Surgical History:  Procedure Laterality Date   DIVERTING ILEOSTOMY N/A 08/23/2022   Procedure: DIVERTING LOOP ILEOSTOMY;  Surgeon: Andria Meuse,  MD;  Location: [facility];  Service: General;  Laterality: N/A;   EUS N/A 06/21/2022   Procedure: LOWER ENDOSCOPIC ULTRASOUND (EUS);  Surgeon: Lemar Lofty., MD;  Location: [facility];  Service: Gastroenterology;  Laterality: N/A;   FLEXIBLE SIGMOIDOSCOPY N/A 06/21/2022   Procedure: FLEXIBLE SIGMOIDOSCOPY;  Surgeon: Meridee Score Netty Starring., MD;  Location: [facility];  Service: Gastroenterology;  Laterality: N/A;   FLEXIBLE SIGMOIDOSCOPY N/A 08/23/2022   Procedure: FLEXIBLE SIGMOIDOSCOPY;  Surgeon: Andria Meuse, MD;  Location: [facility];  Service: General;  Laterality: N/A;   PORTACATH PLACEMENT N/A 09/21/2022   Procedure: INSERTION PORT-A-CATH WITH ULTRASOUND GUIDANCE;  Surgeon: Andria Meuse, MD;  Location: [facility];  Service: General;  Laterality: N/A;   XI ROBOTIC ASSISTED LOWER ANTERIOR RESECTION N/A 08/23/2022   Procedure: XI ROBOTIC ASSISTED LOWER ANTERIOR RESECTION with Introperative assessment of profusion and TAP Block;  Surgeon: Andria Meuse, MD;  Location: [facility];  Service: General;  Laterality: N/A;    MEDICATIONS:  amLODipine (NORVASC)  5 MG tablet   carvedilol (COREG) 25 MG tablet   famotidine (PEPCID) 40 MG tablet   glucose blood (COOL BLOOD GLUCOSE TEST STRIPS) test strip   lidocaine-prilocaine (EMLA) cream   metFORMIN (GLUCOPHAGE) 500 MG tablet   ondansetron (ZOFRAN) 8 MG tablet   pantoprazole (PROTONIX) 40 MG tablet   prochlorperazine (COMPAZINE) 10 MG tablet   sitaGLIPtin (JANUVIA) 100 MG tablet   tiZANidine (ZANAFLEX) 2 MG tablet   No current facility-administered medications for this encounter.    Jodell Cipro Ward, PA-C [facility] [phone]

## 2023-03-13 NOTE — Anesthesia Preprocedure Evaluation (Addendum)
Anesthesia Evaluation  Patient identified by MRN, date of birth, ID band Patient awake    Reviewed: Allergy & Precautions, H&P , NPO status , Patient's Chart, lab work & pertinent test results  Airway Mallampati: II       Dental no notable dental hx.    Pulmonary former smoker   Pulmonary exam normal        Cardiovascular hypertension, Pt. on medications and Pt. on home beta blockers Normal cardiovascular exam+ Valvular Problems/Murmurs AI  + Diastolic murmurs Cleared by cardiology as very low risk for adverse cardiac events.  EF 60%, moderate AI   Neuro/Psych negative neurological ROS  negative psych ROS   GI/Hepatic ,GERD  ,,Rectal CA   Endo/Other  diabetes, Type 2, Oral Hypoglycemic Agents    Renal/GU   negative genitourinary   Musculoskeletal  (+) Arthritis ,    Abdominal Normal abdominal exam  (+)   Peds  Hematology negative hematology ROS (+)   Anesthesia Other Findings 1. Left ventricular ejection fraction, by estimation, is 60 to 65% . Left ventricular ejection fraction by 3D volume is 60 % . The left ventricle has normal function. The left ventricle has no regional wall motion abnormalities. The left ventricular internal cavity size was mildly dilated. There is mild left ventricular hypertrophy. Left ventricular diastolic parameters are consistent with Grade I diastolic dysfunction ( impaired relaxation) . The average left ventricular global longitudinal strain is - 18. 3 % . The global longitudinal strain is normal. 2. Right ventricular systolic function is normal. The right ventricular size is normal. 3. The mitral valve is normal in structure. No evidence of mitral valve regurgitation. No evidence of mitral stenosis. 4. The aortic valve is tricuspid. Aortic valve regurgitation is moderate. No aortic stenosis is present. Aortic regurgitation PHT measures 604 msec. 5. Aneurysm of the aortic root, measuring 48 mm.  Aneurysm of the ascending aorta, measuring 49 mm. There is moderate dilatation of the aortic root, measuring 48 mm. 6. The inferior vena cava is normal in size with greater than 50% respiratory variability, suggesting right atrial pressure of 3 mmHg.  Comparison(  Reproductive/Obstetrics                             Anesthesia Physical Anesthesia Plan  ASA: 3  Anesthesia Plan: General   Post-op Pain Management: Dilaudid IV and Ofirmev IV (intra-op)*   Induction: Intravenous  PONV Risk Score and Plan: 4 or greater and Ondansetron, Midazolam and Treatment may vary due to age or medical condition  Airway Management Planned: Oral ETT  Additional Equipment: None  Intra-op Plan:   Post-operative Plan: Extubation in OR  Informed Consent: I have reviewed the patients History and Physical, chart, labs and discussed the procedure including the risks, benefits and alternatives for the proposed anesthesia with the patient or authorized representative who has indicated his/her understanding and acceptance.     Dental advisory given  Plan Discussed with: CRNA  Anesthesia Plan Comments: (See PAT note 03/12/2023)       Anesthesia Quick Evaluation

## 2023-03-13 NOTE — Assessment & Plan Note (Addendum)
-  pT3N0Mo stage IIA -Diagnosed in 04/2022 -Proceeded with LAR and diverting ileostomy with Dr. Cliffton Asters on 08/23/2022.  -Case again discussed in conference and the recommendation is for 3 months adjuvant chemo. Dr. Mitzi Hansen felt that he could forego radiation -Began adjuvant FOLFOX 10/10/2022, cycle 5 held due to diarrhea and AKI which required hospital admission. -he completed in early June 2024 -CT chest in May 2024 showed consolidation change in RLL lung. I reviewed his PET scan done on 7/29 which was negative for hypermetabolic lesions, no concerns for recurrence of cancer, formal report is not back yet.  -continue cancer surveillance

## 2023-03-14 ENCOUNTER — Inpatient Hospital Stay: Payer: BLUE CROSS/BLUE SHIELD | Attending: Nurse Practitioner | Admitting: Hematology

## 2023-03-14 DIAGNOSIS — C20 Malignant neoplasm of rectum: Secondary | ICD-10-CM

## 2023-03-14 NOTE — Progress Notes (Signed)
     Call pt to review PET result, no answers. I left a message about his negative result and asked him to call back if he has questions. Plan to see him back in 3 months for routine f/u.  Malachy Mood  03/21/2023

## 2023-03-17 ENCOUNTER — Other Ambulatory Visit: Payer: Self-pay | Admitting: Family Medicine

## 2023-03-20 ENCOUNTER — Inpatient Hospital Stay (HOSPITAL_COMMUNITY)
Admission: RE | Admit: 2023-03-20 | Discharge: 2023-03-22 | DRG: 330 | Disposition: A | Discharge status: Home / Self Care | Payer: BLUE CROSS/BLUE SHIELD | Attending: Surgery | Admitting: Surgery

## 2023-03-20 ENCOUNTER — Encounter (HOSPITAL_COMMUNITY): Payer: Self-pay | Admitting: Surgery

## 2023-03-20 ENCOUNTER — Other Ambulatory Visit: Payer: Self-pay

## 2023-03-20 ENCOUNTER — Inpatient Hospital Stay (HOSPITAL_COMMUNITY): Payer: BLUE CROSS/BLUE SHIELD | Admitting: Physician Assistant

## 2023-03-20 ENCOUNTER — Inpatient Hospital Stay (HOSPITAL_COMMUNITY): Payer: BLUE CROSS/BLUE SHIELD | Admitting: Anesthesiology

## 2023-03-20 ENCOUNTER — Encounter (HOSPITAL_COMMUNITY)
Admission: RE | Disposition: A | Discharge status: Home / Self Care | Payer: Self-pay | Source: Home / Self Care | Attending: Surgery

## 2023-03-20 ENCOUNTER — Ambulatory Visit: Payer: BLUE CROSS/BLUE SHIELD | Admitting: Podiatry

## 2023-03-20 DIAGNOSIS — R112 Nausea with vomiting, unspecified: Secondary | ICD-10-CM | POA: Diagnosis not present

## 2023-03-20 DIAGNOSIS — E119 Type 2 diabetes mellitus without complications: Secondary | ICD-10-CM | POA: Diagnosis present

## 2023-03-20 DIAGNOSIS — Z91040 Latex allergy status: Secondary | ICD-10-CM | POA: Diagnosis not present

## 2023-03-20 DIAGNOSIS — M62838 Other muscle spasm: Secondary | ICD-10-CM | POA: Diagnosis present

## 2023-03-20 DIAGNOSIS — Z6833 Body mass index (BMI) 33.0-33.9, adult: Secondary | ICD-10-CM

## 2023-03-20 DIAGNOSIS — Z8249 Family history of ischemic heart disease and other diseases of the circulatory system: Secondary | ICD-10-CM

## 2023-03-20 DIAGNOSIS — Z86010 Personal history of colonic polyps: Secondary | ICD-10-CM

## 2023-03-20 DIAGNOSIS — Z87891 Personal history of nicotine dependence: Secondary | ICD-10-CM

## 2023-03-20 DIAGNOSIS — Z886 Allergy status to analgesic agent status: Secondary | ICD-10-CM

## 2023-03-20 DIAGNOSIS — E785 Hyperlipidemia, unspecified: Secondary | ICD-10-CM | POA: Diagnosis present

## 2023-03-20 DIAGNOSIS — E669 Obesity, unspecified: Secondary | ICD-10-CM | POA: Diagnosis present

## 2023-03-20 DIAGNOSIS — I700 Atherosclerosis of aorta: Secondary | ICD-10-CM | POA: Diagnosis present

## 2023-03-20 DIAGNOSIS — Z8261 Family history of arthritis: Secondary | ICD-10-CM

## 2023-03-20 DIAGNOSIS — Z432 Encounter for attention to ileostomy: Principal | ICD-10-CM

## 2023-03-20 DIAGNOSIS — Z7984 Long term (current) use of oral hypoglycemic drugs: Secondary | ICD-10-CM | POA: Diagnosis not present

## 2023-03-20 DIAGNOSIS — Z888 Allergy status to other drugs, medicaments and biological substances status: Secondary | ICD-10-CM

## 2023-03-20 DIAGNOSIS — Z83438 Family history of other disorder of lipoprotein metabolism and other lipidemia: Secondary | ICD-10-CM | POA: Diagnosis not present

## 2023-03-20 DIAGNOSIS — Z9889 Other specified postprocedural states: Secondary | ICD-10-CM | POA: Diagnosis present

## 2023-03-20 DIAGNOSIS — Z01818 Encounter for other preprocedural examination: Secondary | ICD-10-CM

## 2023-03-20 DIAGNOSIS — I2510 Atherosclerotic heart disease of native coronary artery without angina pectoris: Secondary | ICD-10-CM | POA: Diagnosis present

## 2023-03-20 DIAGNOSIS — I10 Essential (primary) hypertension: Secondary | ICD-10-CM | POA: Diagnosis present

## 2023-03-20 DIAGNOSIS — K648 Other hemorrhoids: Secondary | ICD-10-CM | POA: Diagnosis present

## 2023-03-20 DIAGNOSIS — D509 Iron deficiency anemia, unspecified: Secondary | ICD-10-CM | POA: Diagnosis present

## 2023-03-20 DIAGNOSIS — Z833 Family history of diabetes mellitus: Secondary | ICD-10-CM

## 2023-03-20 DIAGNOSIS — D62 Acute posthemorrhagic anemia: Secondary | ICD-10-CM | POA: Diagnosis not present

## 2023-03-20 DIAGNOSIS — Z825 Family history of asthma and other chronic lower respiratory diseases: Secondary | ICD-10-CM

## 2023-03-20 DIAGNOSIS — Z85048 Personal history of other malignant neoplasm of rectum, rectosigmoid junction, and anus: Secondary | ICD-10-CM | POA: Diagnosis not present

## 2023-03-20 DIAGNOSIS — I77810 Thoracic aortic ectasia: Secondary | ICD-10-CM | POA: Diagnosis present

## 2023-03-20 DIAGNOSIS — Z79899 Other long term (current) drug therapy: Secondary | ICD-10-CM | POA: Diagnosis not present

## 2023-03-20 DIAGNOSIS — K219 Gastro-esophageal reflux disease without esophagitis: Secondary | ICD-10-CM | POA: Diagnosis present

## 2023-03-20 DIAGNOSIS — Z823 Family history of stroke: Secondary | ICD-10-CM | POA: Diagnosis not present

## 2023-03-20 HISTORY — PX: FLEXIBLE SIGMOIDOSCOPY: SHX5431

## 2023-03-20 HISTORY — PX: ILEOSTOMY CLOSURE: SHX1784

## 2023-03-20 LAB — GLUCOSE, CAPILLARY
Glucose-Capillary: 117 mg/dL — ABNORMAL HIGH (ref 70–99)
Glucose-Capillary: 165 mg/dL — ABNORMAL HIGH (ref 70–99)
Glucose-Capillary: 163 mg/dL — ABNORMAL HIGH (ref 70–99)
Glucose-Capillary: 157 mg/dL — ABNORMAL HIGH (ref 70–99)

## 2023-03-20 LAB — ABO/RH: ABO/RH(D): A POS

## 2023-03-20 SURGERY — CLOSURE, ILEOSTOMY
Anesthesia: General

## 2023-03-20 MED ORDER — DEXAMETHASONE SODIUM PHOSPHATE 10 MG/ML IJ SOLN
INTRAMUSCULAR | Status: AC
  Filled 2023-03-20: qty 1

## 2023-03-20 MED ORDER — BUPIVACAINE LIPOSOME 1.3 % IJ SUSP: 20.0000 mL | Freq: Once | INTRAMUSCULAR | Status: DC

## 2023-03-20 MED ORDER — INSULIN ASPART 100 UNIT/ML IJ SOLN
0.0000 [IU] | Freq: Three times a day (TID) | INTRAMUSCULAR | Status: DC
  Administered 2023-03-20: 3 [IU] via SUBCUTANEOUS
  Administered 2023-03-21 – 2023-03-22 (×4): 2 [IU] via SUBCUTANEOUS

## 2023-03-20 MED ORDER — LIDOCAINE 2% (20 MG/ML) 5 ML SYRINGE
INTRAMUSCULAR | Status: DC | PRN
  Administered 2023-03-20: 100 mg via INTRAVENOUS

## 2023-03-20 MED ORDER — AMLODIPINE BESYLATE 5 MG PO TABS: 5.0000 mg | ORAL_TABLET | Freq: Every day | ORAL | Status: DC | PRN

## 2023-03-20 MED ORDER — TRAMADOL HCL 50 MG PO TABS
50.0000 mg | ORAL_TABLET | Freq: Four times a day (QID) | ORAL | Status: DC | PRN
  Administered 2023-03-20 – 2023-03-22 (×4): 50 mg via ORAL
  Filled 2023-03-20 (×4): qty 1

## 2023-03-20 MED ORDER — LIDOCAINE HCL (PF) 2 % IJ SOLN
INTRAMUSCULAR | Status: AC
  Filled 2023-03-20: qty 5

## 2023-03-20 MED ORDER — FENTANYL CITRATE (PF) 250 MCG/5ML IJ SOLN
INTRAMUSCULAR | Status: DC | PRN
  Administered 2023-03-20: 100 ug via INTRAVENOUS
  Administered 2023-03-20 (×3): 50 ug via INTRAVENOUS

## 2023-03-20 MED ORDER — ACETAMINOPHEN 500 MG PO TABS
1000.0000 mg | ORAL_TABLET | Freq: Four times a day (QID) | ORAL | Status: DC
  Administered 2023-03-20 – 2023-03-22 (×6): 1000 mg via ORAL
  Filled 2023-03-20 (×6): qty 2

## 2023-03-20 MED ORDER — ONDANSETRON HCL 4 MG/2ML IJ SOLN
4.0000 mg | Freq: Four times a day (QID) | INTRAMUSCULAR | Status: DC | PRN
  Administered 2023-03-21: 4 mg via INTRAVENOUS
  Filled 2023-03-20: qty 2

## 2023-03-20 MED ORDER — SIMETHICONE 80 MG PO CHEW
40.0000 mg | CHEWABLE_TABLET | Freq: Four times a day (QID) | ORAL | Status: DC | PRN
  Administered 2023-03-21: 40 mg via ORAL
  Filled 2023-03-20: qty 1

## 2023-03-20 MED ORDER — ROCURONIUM BROMIDE 10 MG/ML (PF) SYRINGE
PREFILLED_SYRINGE | INTRAVENOUS | Status: DC | PRN
  Administered 2023-03-20: 60 mg via INTRAVENOUS
  Administered 2023-03-20: 20 mg via INTRAVENOUS

## 2023-03-20 MED ORDER — MIDAZOLAM HCL 2 MG/2ML IJ SOLN
INTRAMUSCULAR | Status: AC
  Filled 2023-03-20: qty 2

## 2023-03-20 MED ORDER — FAMOTIDINE 20 MG PO TABS
40.0000 mg | ORAL_TABLET | Freq: Every day | ORAL | Status: DC
  Administered 2023-03-20 – 2023-03-21 (×2): 40 mg via ORAL
  Filled 2023-03-20 (×2): qty 2

## 2023-03-20 MED ORDER — BUPIVACAINE-EPINEPHRINE (PF) 0.25% -1:200000 IJ SOLN
INTRAMUSCULAR | Status: DC | PRN
  Administered 2023-03-20: 50 mL

## 2023-03-20 MED ORDER — HYDRALAZINE HCL 20 MG/ML IJ SOLN: 10.0000 mg | INTRAMUSCULAR | Status: DC | PRN

## 2023-03-20 MED ORDER — SODIUM CHLORIDE 0.9 % IV SOLN
2.0000 g | INTRAVENOUS | Status: AC
  Administered 2023-03-20: 2 g via INTRAVENOUS
  Filled 2023-03-20: qty 2

## 2023-03-20 MED ORDER — ALVIMOPAN 12 MG PO CAPS
12.0000 mg | ORAL_CAPSULE | ORAL | Status: AC
  Administered 2023-03-20: 12 mg via ORAL
  Filled 2023-03-20: qty 1

## 2023-03-20 MED ORDER — MEPERIDINE HCL 50 MG/ML IJ SOLN: 6.2500 mg | INTRAMUSCULAR | Status: DC | PRN

## 2023-03-20 MED ORDER — ACETAMINOPHEN 500 MG PO TABS
1000.0000 mg | ORAL_TABLET | ORAL | Status: AC
  Administered 2023-03-20: 1000 mg via ORAL
  Filled 2023-03-20: qty 2

## 2023-03-20 MED ORDER — INSULIN ASPART 100 UNIT/ML IJ SOLN: 0.0000 [IU] | INTRAMUSCULAR | Status: DC | PRN

## 2023-03-20 MED ORDER — INSULIN ASPART 100 UNIT/ML IJ SOLN: 0.0000 [IU] | Freq: Every day | INTRAMUSCULAR | Status: DC

## 2023-03-20 MED ORDER — ENSURE PRE-SURGERY PO LIQD
592.0000 mL | Freq: Once | ORAL | Status: DC
  Filled 2023-03-20: qty 592

## 2023-03-20 MED ORDER — CHLORHEXIDINE GLUCONATE CLOTH 2 % EX PADS: 6.0000 | MEDICATED_PAD | Freq: Once | CUTANEOUS | Status: DC

## 2023-03-20 MED ORDER — PROMETHAZINE HCL 25 MG/ML IJ SOLN
6.2500 mg | INTRAMUSCULAR | Status: DC | PRN
  Administered 2023-03-20: 6.25 mg via INTRAVENOUS

## 2023-03-20 MED ORDER — ENSURE PRE-SURGERY PO LIQD
296.0000 mL | Freq: Once | ORAL | Status: DC
  Filled 2023-03-20: qty 296

## 2023-03-20 MED ORDER — PROCHLORPERAZINE MALEATE 10 MG PO TABS: 10.0000 mg | ORAL_TABLET | Freq: Four times a day (QID) | ORAL | Status: DC | PRN

## 2023-03-20 MED ORDER — HYDRALAZINE HCL 20 MG/ML IJ SOLN
12.5000 mg | Freq: Once | INTRAMUSCULAR | Status: AC
  Administered 2023-03-20: 13 mg via INTRAVENOUS

## 2023-03-20 MED ORDER — FENTANYL CITRATE (PF) 250 MCG/5ML IJ SOLN
INTRAMUSCULAR | Status: AC
  Filled 2023-03-20: qty 5

## 2023-03-20 MED ORDER — LINAGLIPTIN 5 MG PO TABS
5.0000 mg | ORAL_TABLET | Freq: Every day | ORAL | Status: DC
  Administered 2023-03-21: 5 mg via ORAL
  Filled 2023-03-20: qty 1

## 2023-03-20 MED ORDER — MIDAZOLAM HCL 2 MG/2ML IJ SOLN
INTRAMUSCULAR | Status: DC | PRN
  Administered 2023-03-20: 2 mg via INTRAVENOUS

## 2023-03-20 MED ORDER — MELATONIN 3 MG PO TABS: 3.0000 mg | ORAL_TABLET | Freq: Every evening | ORAL | Status: DC | PRN

## 2023-03-20 MED ORDER — METFORMIN HCL 500 MG PO TABS
500.0000 mg | ORAL_TABLET | Freq: Two times a day (BID) | ORAL | Status: DC
  Administered 2023-03-20 – 2023-03-22 (×4): 500 mg via ORAL
  Filled 2023-03-20 (×4): qty 1

## 2023-03-20 MED ORDER — DIPHENHYDRAMINE HCL 50 MG/ML IJ SOLN: 12.5000 mg | Freq: Four times a day (QID) | INTRAMUSCULAR | Status: DC | PRN

## 2023-03-20 MED ORDER — HYDROMORPHONE HCL 1 MG/ML IJ SOLN
0.5000 mg | INTRAMUSCULAR | Status: DC | PRN
  Administered 2023-03-20 – 2023-03-21 (×7): 0.5 mg via INTRAVENOUS
  Filled 2023-03-20 (×6): qty 0.5

## 2023-03-20 MED ORDER — ONDANSETRON HCL 4 MG/2ML IJ SOLN
INTRAMUSCULAR | Status: DC | PRN
  Administered 2023-03-20: 4 mg via INTRAVENOUS

## 2023-03-20 MED ORDER — PROMETHAZINE HCL 25 MG/ML IJ SOLN
INTRAMUSCULAR | Status: AC
  Filled 2023-03-20: qty 1

## 2023-03-20 MED ORDER — SUGAMMADEX SODIUM 200 MG/2ML IV SOLN
INTRAVENOUS | Status: DC | PRN
  Administered 2023-03-20: 200 mg via INTRAVENOUS

## 2023-03-20 MED ORDER — CARVEDILOL 25 MG PO TABS
25.0000 mg | ORAL_TABLET | Freq: Two times a day (BID) | ORAL | Status: DC
  Administered 2023-03-20 – 2023-03-21 (×3): 25 mg via ORAL
  Filled 2023-03-20 (×3): qty 1

## 2023-03-20 MED ORDER — HYDROMORPHONE HCL 1 MG/ML IJ SOLN
0.2500 mg | INTRAMUSCULAR | Status: DC | PRN
  Administered 2023-03-20: 0.25 mg via INTRAVENOUS
  Administered 2023-03-20 (×2): 0.5 mg via INTRAVENOUS
  Administered 2023-03-20: 0.25 mg via INTRAVENOUS
  Administered 2023-03-20: 0.5 mg via INTRAVENOUS

## 2023-03-20 MED ORDER — HYDROMORPHONE HCL 1 MG/ML IJ SOLN
INTRAMUSCULAR | Status: AC
  Filled 2023-03-20: qty 1

## 2023-03-20 MED ORDER — ONDANSETRON HCL 4 MG/2ML IJ SOLN
INTRAMUSCULAR | Status: AC
  Filled 2023-03-20: qty 2

## 2023-03-20 MED ORDER — ALUM & MAG HYDROXIDE-SIMETH 200-200-20 MG/5ML PO SUSP
30.0000 mL | Freq: Four times a day (QID) | ORAL | Status: DC | PRN
  Administered 2023-03-21: 30 mL via ORAL
  Filled 2023-03-20: qty 30

## 2023-03-20 MED ORDER — DIPHENHYDRAMINE HCL 12.5 MG/5ML PO ELIX: 12.5000 mg | ORAL_SOLUTION | Freq: Four times a day (QID) | ORAL | Status: DC | PRN

## 2023-03-20 MED ORDER — ALVIMOPAN 12 MG PO CAPS: 12.0000 mg | ORAL_CAPSULE | Freq: Two times a day (BID) | ORAL | Status: DC

## 2023-03-20 MED ORDER — HEPARIN SODIUM (PORCINE) 5000 UNIT/ML IJ SOLN
5000.0000 [IU] | Freq: Once | INTRAMUSCULAR | Status: AC
  Administered 2023-03-20: 5000 [IU] via SUBCUTANEOUS
  Filled 2023-03-20: qty 1

## 2023-03-20 MED ORDER — GLUCERNA SHAKE PO LIQD
237.0000 mL | Freq: Two times a day (BID) | ORAL | Status: DC
  Administered 2023-03-20 – 2023-03-21 (×3): 237 mL via ORAL
  Filled 2023-03-20 (×5): qty 237

## 2023-03-20 MED ORDER — PROPOFOL 10 MG/ML IV BOLUS
INTRAVENOUS | Status: DC | PRN
  Administered 2023-03-20: 200 mg via INTRAVENOUS

## 2023-03-20 MED ORDER — BUPIVACAINE-EPINEPHRINE 0.25% -1:200000 IJ SOLN
INTRAMUSCULAR | Status: AC
  Filled 2023-03-20: qty 1

## 2023-03-20 MED ORDER — HYDRALAZINE HCL 20 MG/ML IJ SOLN
INTRAMUSCULAR | Status: AC
  Filled 2023-03-20: qty 1

## 2023-03-20 MED ORDER — DEXAMETHASONE SODIUM PHOSPHATE 10 MG/ML IJ SOLN
INTRAMUSCULAR | Status: DC | PRN
  Administered 2023-03-20: 4 mg via INTRAVENOUS

## 2023-03-20 MED ORDER — ONDANSETRON HCL 4 MG PO TABS: 4.0000 mg | ORAL_TABLET | Freq: Four times a day (QID) | ORAL | Status: DC | PRN

## 2023-03-20 MED ORDER — HEPARIN SODIUM (PORCINE) 5000 UNIT/ML IJ SOLN
5000.0000 [IU] | Freq: Three times a day (TID) | INTRAMUSCULAR | Status: DC
  Administered 2023-03-20 – 2023-03-22 (×6): 5000 [IU] via SUBCUTANEOUS
  Filled 2023-03-20 (×7): qty 1

## 2023-03-20 MED ORDER — ORAL CARE MOUTH RINSE: 15.0000 mL | Freq: Once | OROMUCOSAL | Status: AC

## 2023-03-20 MED ORDER — CHLORHEXIDINE GLUCONATE 0.12 % MT SOLN
15.0000 mL | Freq: Once | OROMUCOSAL | Status: AC
  Administered 2023-03-20: 15 mL via OROMUCOSAL

## 2023-03-20 MED ORDER — PANTOPRAZOLE SODIUM 40 MG PO TBEC
40.0000 mg | DELAYED_RELEASE_TABLET | Freq: Every day | ORAL | Status: DC
  Administered 2023-03-21: 40 mg via ORAL
  Filled 2023-03-20: qty 1

## 2023-03-20 MED ORDER — BUPIVACAINE LIPOSOME 1.3 % IJ SUSP
INTRAMUSCULAR | Status: AC
  Filled 2023-03-20: qty 20

## 2023-03-20 MED ORDER — 0.9 % SODIUM CHLORIDE (POUR BTL) OPTIME
TOPICAL | Status: DC | PRN
  Administered 2023-03-20: 1000 mL

## 2023-03-20 MED ORDER — ACETAMINOPHEN 10 MG/ML IV SOLN: 1000.0000 mg | Freq: Once | INTRAVENOUS | Status: DC | PRN

## 2023-03-20 MED ORDER — LACTATED RINGERS IV SOLN: INTRAVENOUS | Status: DC

## 2023-03-20 MED ORDER — PROPOFOL 500 MG/50ML IV EMUL
INTRAVENOUS | Status: AC
  Filled 2023-03-20: qty 50

## 2023-03-20 MED ORDER — ROCURONIUM BROMIDE 10 MG/ML (PF) SYRINGE
PREFILLED_SYRINGE | INTRAVENOUS | Status: AC
  Filled 2023-03-20: qty 10

## 2023-03-20 SURGICAL SUPPLY — 52 items
APL PRP STRL LF DISP 70% ISPRP (MISCELLANEOUS) ×1
BAG COUNTER SPONGE SURGICOUNT (BAG) IMPLANT
BAG SPNG CNTER NS LX DISP (BAG)
BLADE HEX COATED 2.75 (ELECTRODE) ×1 IMPLANT
CHLORAPREP W/TINT 26 (MISCELLANEOUS) ×1 IMPLANT
COVER MAYO STAND STRL (DRAPES) ×1 IMPLANT
DRAPE LAPAROSCOPIC ABDOMINAL (DRAPES) ×1 IMPLANT
DRAPE UTILITY XL STRL (DRAPES) IMPLANT
DRAPE WARM FLUID 44X44 (DRAPES) ×1 IMPLANT
DRSG OPSITE POSTOP 4X10 (GAUZE/BANDAGES/DRESSINGS) IMPLANT
DRSG OPSITE POSTOP 4X6 (GAUZE/BANDAGES/DRESSINGS) IMPLANT
DRSG OPSITE POSTOP 4X8 (GAUZE/BANDAGES/DRESSINGS) IMPLANT
DRSG TELFA 3X8 NADH STRL (GAUZE/BANDAGES/DRESSINGS) IMPLANT
ELECT REM PT RETURN 15FT ADLT (MISCELLANEOUS) ×1 IMPLANT
GAUZE PAD ABD 8X10 STRL (GAUZE/BANDAGES/DRESSINGS) IMPLANT
GAUZE SPONGE 4X4 12PLY STRL (GAUZE/BANDAGES/DRESSINGS) ×1 IMPLANT
GLOVE BIOGEL PI IND STRL 7.0 (GLOVE) ×1 IMPLANT
GLOVE SURG SS PI 7.0 STRL IVOR (GLOVE) ×1 IMPLANT
GOWN STRL REUS W/ TWL XL LVL3 (GOWN DISPOSABLE) IMPLANT
GOWN STRL REUS W/TWL XL LVL3 (GOWN DISPOSABLE)
HANDLE SUCTION POOLE (INSTRUMENTS) ×1 IMPLANT
HOLDER FOLEY CATH W/STRAP (MISCELLANEOUS) IMPLANT
KIT BASIN OR (CUSTOM PROCEDURE TRAY) ×1 IMPLANT
KIT TURNOVER KIT A (KITS) IMPLANT
MANIFOLD NEPTUNE II (INSTRUMENTS) ×1 IMPLANT
PACK GENERAL/GYN (CUSTOM PROCEDURE TRAY) ×1 IMPLANT
PAD POSITIONING PINK XL (MISCELLANEOUS) IMPLANT
RELOAD PROXIMATE 75MM BLUE (ENDOMECHANICALS) ×2 IMPLANT
RELOAD STAPLE 75 3.8 BLU REG (ENDOMECHANICALS) IMPLANT
STAPLER GUN LINEAR PROX 60 (STAPLE) IMPLANT
STAPLER PROXIMATE 75MM BLUE (STAPLE) IMPLANT
SUCTION POOLE HANDLE (INSTRUMENTS) ×1
SUT MNCRL AB 4-0 PS2 18 (SUTURE) IMPLANT
SUT NOVA NAB DX-16 0-1 5-0 T12 (SUTURE) IMPLANT
SUT NOVA NAB GS-21 0 18 T12 DT (SUTURE) ×2 IMPLANT
SUT PDS AB 1 CT1 27 (SUTURE) IMPLANT
SUT PROLENE 2 0 BLUE (SUTURE) IMPLANT
SUT SILK 2 0 (SUTURE) ×1
SUT SILK 2 0 SH CR/8 (SUTURE) ×1 IMPLANT
SUT SILK 2-0 18XBRD TIE 12 (SUTURE) ×1 IMPLANT
SUT SILK 3 0 (SUTURE) ×1
SUT SILK 3 0 SH CR/8 (SUTURE) ×1 IMPLANT
SUT SILK 3-0 18XBRD TIE 12 (SUTURE) ×1 IMPLANT
SUT VIC AB 2-0 SH 18 (SUTURE) IMPLANT
SUT VIC AB 2-0 SH 27 (SUTURE) ×1
SUT VIC AB 2-0 SH 27X BRD (SUTURE) ×2 IMPLANT
SUT VIC AB 3-0 SH 18 (SUTURE) IMPLANT
SUT VIC AB 4-0 PS2 18 (SUTURE) ×1 IMPLANT
TAPE CLOTH SURG 4X10 WHT LF (GAUZE/BANDAGES/DRESSINGS) IMPLANT
TOWEL OR 17X26 10 PK STRL BLUE (TOWEL DISPOSABLE) ×2 IMPLANT
TOWEL OR NON WOVEN STRL DISP B (DISPOSABLE) ×2 IMPLANT
YANKAUER SUCT BULB TIP NO VENT (SUCTIONS) ×1 IMPLANT

## 2023-03-20 NOTE — Transfer of Care (Signed)
Immediate Anesthesia Transfer of Care Note  Patient: Robert [name]  Procedure(s) Performed: TAKEDOWN OF DIVERTING LOOP ILEOSTOMY DIAGNOSTIC FLEXIBLE SIGMOIDOSCOPY  Patient Location: PACU  Anesthesia Type:General  Level of Consciousness: awake, alert , and oriented  Airway & Oxygen Therapy: Patient Spontanous Breathing and Patient connected to face mask oxygen  Post-op Assessment: Report given to RN and Post -op Vital signs reviewed and stable  Post vital signs: Reviewed and stable  Last Vitals:  Vitals Value Taken Time  BP 144/78   Temp    Pulse 54 03/20/23 1015  Resp 0 03/20/23 1015  SpO2 97 % 03/20/23 1015  Vitals shown include unfiled device data.  Last Pain:  Vitals:   03/20/23 0748  TempSrc:   PainSc: 0-No pain         Complications:  Encounter Notable Events  Notable Event Outcome Phase Comment  Difficult to intubate - unexpected  Intraprocedure Filed from anesthesia note documentation.

## 2023-03-20 NOTE — H&P (Signed)
CC: Here today for surgery  HPI: Robert [name] is an 52 y.o. male with history of HTN, DM, GERD, CAD, whom is seen in the office today as a referral by Dr. Abner Greenspan for evaluation of rectal cancer  Colonoscopy with Dr. Russella Dar 05/09/22 -  Five, 5-7 mm polyps removed Proximal rectal tumor, biopsied, submucosal injection tattoo - area proximal to mass tattoo'd x2 Internal hemorrhoids  PATH - Tubular adenomas from poylps; rectal mass - invasive mod diff adenoCA  CT CAP 05/18/22 -  1. Known primary malignancy is not well visualized. Correlate with recent colonoscopy. 2. No evidence of metastatic disease in the chest, abdomen or pelvis. 3. Mildly enlarged bilateral inguinal lymph nodes, likely reactive. 4. 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 Recommend annual imaging followup by CTA or MRA. This recommendation follows 2010 ACCF/AHA/AATS/ACR/ASA/SCA/SCAI/SIR/STS/SVM Guidelines for the Diagnosis and Management of Patients with Thoracic Aortic Disease. Circulation. 2010; 121: Z610-R604. 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 Severe coronary artery calcifications of the LAD and circumflex. 6. Aortic Atherosclerosis (ICD10-I70.0).  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  He notes history of approximately 2 to 3 years of intermittent dark red blood per rectum that coats the stool. Generally not bright or dripping in the toilet bowl. This resolved for some time at which point he had forgotten about it. More recently, had increased again.  Referred to see [place] for evaluation. Of note, his case was presented at our multidisciplinary tumor board 05/30/2022. Consensus recommendation was for EUS for further local staging categorization to help delineate if this is actually a T1 vs T2 lesion.  He was recently started on  iron by Dr. Russella Dar for possible iron deficiency anemia. Hemoglobin is 12.5 on 9/27. This was down from 14.6 back in March.  Underwent EUS with Dr. Meridee Score 06/21/2022: FLEX Impression: - Diverticulosis in the recto-sigmoid colon and in the sigmoid colon. - Malignant tumor in the proximal rectum (11-14 cm) - Non-bleeding non-thrombosed internal hemorrhoids. EUS Impression: - Rectal mass was visualized endosonographically. It invades the muscularis propria but does not extend through it. A tissue diagnosis was obtained prior to this exam. This is consistent with adenocarcinoma. This was staged uT2 uN0. - No malignant-appearing lymph nodes were visualized endosonographically in the perirectal region and in the left iliac region. - The internal anal sphincter was visualized endosonographically and appeared normal.  Case was reviewed with our multidisciplinary board and consensus was for upfront surgery given this endoscopic ultrasound finding.   INTERVAL HX OR 08/23/22 Robotic assisted low anterior resection with diverting loop ileostomy Diagnostic flexible sigmoidoscopy - necessary to confirm tumor location Intraoperative assessment of perfusion using ICG fluorescence imaging Bilateral transversus abdominus plane (TAP) blocks  FINDINGS: Tattoo in proximal rectum. Flexible sigmoidoscope demonstrates the fungiating mass to be located distal to this along the right posterior wall. With the endoscope in place, we are able to orient our distal point of transection of ~2 cm with sufficient distal margin. Narrow male pelvis increasing the complexity of the dissection. No obvious metastatic disease on visceral or parietal peritoneum or liver. A well perfused, tension free, hemostatic, air tight 31 mm EEA colorectal anastomosis fashioned 4 cm from the anal verge by a straightened flexible sigmoidoscopy (approximately 2 cm from the top of the sphincter complex).   Admitted postoperatively and recovered  well. He was discharged home 08/26/2022.  PATH FINAL MICROSCOPIC DIAGNOSIS:   A. COLON, RECTOSIGMOID, RESECTION:  - Invasive moderately differentiated adenocarcinoma, 2.5 cm, involving  rectum  - Carcinoma invades focally into the perirectal adipose tissue  - Resection margins are negative for carcinoma  - Negative for lymphovascular invasion  - Fourteen lymph nodes, negative for carcinoma (0/14)  - See oncology table   B. DISTAL MARGIN DONUT:  - Rectal donut, negative for carcinoma   IHC Expression - preserved  Case was presented at [facility]. Following with Dr. Mosetta Putt, on adjuvant chemo - was on CapeOx but switched to FOLFOX for side effect/rxn with his PPI which had landed him in the hospital for a few days. He has been much better. He had noted significant dehydration type symptoms preceding his admission to the hospital which was all from a timing standpoint coinciding with initiation of chemotherapy. He has been carefully monitoring his ostomy output at home. He has found a healthy medium with regards to his Imodium dosing-once daily currently him with that he is able to keep his output thickened and toothpaste consistency and under a liter a day. Good appetite. No nausea or vomiting. No complaints.  Planning adjuvant chemotherapy-3 months.  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  He denies any changes in health or health history since we met in the office. No new medications/allergies. He states he is ready for surgery today.  INTERVAL HX Completed systemic therapy. Has been doing well. No complaints voiced.  PMH: HTN, DM (on metformin), CAD, GERD, Iron deficiency anemia,  PSH: Denies  FHx: Father had a brain tumor behind his sinuses. Denies any known family history of colorectal, breast, endometrial or ovarian cancer  Social Hx:  Reformed  smoker, quit 1997. Social EtOH use. Denies illicit drug use. Works at [employer]. He is here today alone.   Past Medical History:  Diagnosis Date   Arthralgia 01/11/2013   Diffuse, b/l Hips R>L Knees, ankles, low back   Arthritis    Back pain 12/08/2015   Cancer (HCC)    Coronary artery disease    Diabetes mellitus type 2 in obese 11/09/2012   Dyslipidemia 11/09/2012   Esophageal reflux 12/08/2015   Feeling grief 06/09/2020   History of migraine headaches    Hyperglycemia 11/09/2012   Hypertension     Past Surgical History:  Procedure Laterality Date   DIVERTING ILEOSTOMY N/A 08/23/2022   Procedure: DIVERTING LOOP ILEOSTOMY;  Surgeon: Andria Meuse, MD;  Location: [facility];  Service: General;  Laterality: N/A;   EUS N/A 06/21/2022   Procedure: LOWER ENDOSCOPIC ULTRASOUND (EUS);  Surgeon: Lemar Lofty., MD;  Location: [facility];  Service: Gastroenterology;  Laterality: N/A;   FLEXIBLE SIGMOIDOSCOPY N/A 06/21/2022   Procedure: FLEXIBLE SIGMOIDOSCOPY;  Surgeon: Meridee Score Netty Starring., MD;  Location: [facility];  Service: Gastroenterology;  Laterality: N/A;   FLEXIBLE SIGMOIDOSCOPY N/A 08/23/2022   Procedure: FLEXIBLE SIGMOIDOSCOPY;  Surgeon: Andria Meuse, MD;  Location: [facility];  Service: General;  Laterality: N/A;   PORTACATH PLACEMENT N/A 09/21/2022   Procedure: INSERTION PORT-A-CATH WITH ULTRASOUND GUIDANCE;  Surgeon: Andria Meuse, MD;  Location: [facility];  Service: General;  Laterality: N/A;   XI ROBOTIC ASSISTED LOWER ANTERIOR RESECTION N/A 08/23/2022   Procedure: XI ROBOTIC ASSISTED LOWER ANTERIOR RESECTION with Introperative assessment of profusion and TAP Block;  Surgeon: Andria Meuse, MD;  Location: [facility];  Service: General;  Laterality: N/A;    Family History  Problem Relation Age of Onset   Arthritis Mother    Hypertension Mother    Diabetes Mother    Cancer Mother    Arthritis Father    Hypertension Father    Diabetes Father     Sleep apnea Father    Obesity Father    Barrett's esophagus Father    Hyperlipidemia Brother    Hypertension Brother    Diabetes Brother    Arthritis Maternal Grandmother    Diabetes Maternal Grandmother    Stroke Maternal Grandmother    COPD Maternal Grandfather    Arthritis Paternal Grandmother    Diabetes Paternal Grandmother    Cancer Paternal Grandmother        lung, smoker   Diabetes Paternal Grandfather    Heart disease Paternal Grandfather 34       MI   Kidney disease Neg Hx     Social:  reports that he quit smoking about 27 years ago. His smoking use included cigarettes. He started smoking about 37 years ago. He has a 15 pack-year smoking history. He has never used smokeless tobacco. He reports current alcohol use of about 6.0 - 10.0 standard drinks of alcohol per week. He reports that he does not currently use drugs after having used the following drugs: Marijuana.  Allergies:  Allergies  Allergen Reactions   Advil [Ibuprofen] Shortness Of Breath   Aleve [Naproxen] Shortness Of Breath   Mucinex [Guaifenesin Er] Shortness Of Breath, Itching, Swelling, Dermatitis, Rash and Other (See Comments)    Swelling of hands and feet   Nsaids Shortness Of Breath, Itching, Swelling, Rash and Other (See Comments)    Aleve and Advil    Levaquin [Levofloxacin]     Aortic aneurysm; not recommend in this condition unless no other agents would cover   Latex Rash and Other (See Comments)    "Cartilage will harden"    Medications: I have reviewed the patient's current medications.  Results for orders placed or performed during the hospital encounter of 03/20/23 (from the past 48 hour(s))  Glucose, capillary     Status: Abnormal   Collection Time: 03/20/23  7:05 AM  Result Value Ref Range   Glucose-Capillary 117 (H) 70 - 99 mg/dL    Comment: Glucose reference range applies only to samples taken after fasting for at least 8 hours.    No results found.   PE Blood pressure (!)  134/94, pulse (!) 57, temperature 98.2 F (36.8 C), temperature source Oral, resp. rate 18, height 6\' 3"  (1.905 m), weight 110 kg, SpO2 94%. Constitutional: NAD; conversant Eyes: Moist conjunctiva; no lid lag; anicteric Lungs: Normal respiratory effort CV: RRR GI: Abd soft, NT/ND Psychiatric: Appropriate affect  Results for orders placed or performed during the hospital encounter of 03/20/23 (from the past 48 hour(s))  Glucose, capillary     Status: Abnormal   Collection Time: 03/20/23  7:05 AM  Result Value Ref Range   Glucose-Capillary 117 (H) 70 - 99 mg/dL    Comment: Glucose reference range applies only to samples taken after fasting for at least 8 hours.    No results found.  A/P: Robert [name] is an 52 y.o. male with hx of HTN, DM, GERD here for evaluation of newly diagnosed cmriT1/T2N0 rectal cancer - right lateral wall, mid rectum CT CAP 05/17/22 -   Cmri 05/29/22 - T1/T2N0, 4.6 cm from IAS, 10.1 cm from   ceusT2N0  06/21/22 (Dr. Meridee Score)  MDT --> recommendation made for upfront surgery, LAR. OR 08/23/22 robotic LAR/DLI pT3N0 moderately differentiated adenocarcinoma; margins negative  -Postoperatively, case reviewed at MDT. Consensus recommendation for discussions with oncology for adjuvant treatment. He has an appointment scheduled 09/13/2022. -We discussed things watch out for moving forward.  Now on FOLFOX - last dose next week  GGE Clear 11/19/22  -Continue Imodium and his current dosing with Lomotil.   -Cardiac clearance from Dr. Izora Ribas   -The anatomy and physiology of the GI tract was reviewed with him as it pertains to his current loop ileostomy status.  -We discussed ultimate plans for loop ileostomy reversal - potentially first week of July if scheduling allows. Would plan for takedown of loop ileostomy, diagnostic flexible sigmoidoscopy (to assess anastomosis prior to [place] actually taking the ostomy down but done at the same time).  -The planned  procedures, material risks (including, but not limited to, pain, bleeding, infection, scarring, need for blood transfusion, damage to surrounding structures- blood vessels/nerves/viscus/organs, leak from anastomosis, need for additional procedures, low anterior resection syndrome (LARS) = increased fecal urgency and/or frequency, scenarios where a stoma may be necessary and where it may be permanent, worsening of pre-existing medical conditions, chronic diarrhea, constipation secondary to narcotic use, hernia, recurrence, pneumonia, heart attack, stroke, death) benefits and alternatives to surgery were discussed at length. The patient's questions were answered to his satisfaction, he voiced understanding and elected to proceed with surgery. Additionally, we discussed typical postoperative expectations and the recovery process.   Marin Olp, MD [facility]

## 2023-03-20 NOTE — Plan of Care (Signed)
  Problem: Education: Goal: Understanding of discharge needs will improve Outcome: Progressing Goal: Verbalization of understanding of the causes of altered bowel function will improve Outcome: Progressing   Problem: Activity: Goal: Ability to tolerate increased activity will improve Outcome: Progressing

## 2023-03-20 NOTE — Anesthesia Postprocedure Evaluation (Signed)
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  Procedure(s) Performed: TAKEDOWN OF DIVERTING LOOP ILEOSTOMY DIAGNOSTIC FLEXIBLE SIGMOIDOSCOPY     Patient location during evaluation: PACU Anesthesia Type: General Level of consciousness: sedated Pain management: pain level controlled Vital Signs Assessment: post-procedure vital signs reviewed and stable Respiratory status: spontaneous breathing Cardiovascular status: stable Postop Assessment: no headache Anesthetic complications: yes (PONV)  Encounter Notable Events  Notable Event Outcome Phase Comment  Difficult to intubate - unexpected  Intraprocedure Filed from anesthesia note documentation.    Last Vitals:  Vitals:   03/20/23 1145 03/20/23 1200  BP: (!) 155/89 (!) 150/84  Pulse: 63 65  Resp: 10 12  Temp: 36.7 C   SpO2: 94% 94%    Last Pain:  Vitals:   03/20/23 1200  TempSrc:   PainSc: Asleep                 Caren Macadam

## 2023-03-20 NOTE — Op Note (Signed)
03/20/2023  10:17 AM  PATIENT:  Robert [name]  52 y.o. male  Patient Care Team: Bradd Canary, MD as PCP - General (Family Medicine) Christell Constant, MD as PCP - Cardiology (Cardiology) Malachy Mood, MD as Consulting Physician (Oncology) Pollyann Samples, NP as Nurse Practitioner (Oncology) Marily Lente, RN as Oncology Nurse Navigator (Oncology)  PRE-OPERATIVE DIAGNOSIS:  Ileostomy status, history of rectal cancer  POST-OPERATIVE DIAGNOSIS:  Same  PROCEDURE:   Takedown of diverting loop ileostomy Diagnostic flexible sigmoidoscopy (necessary to confirm post-surgical anatomy prior to ileostomy reversal)  SURGEON:  Stephanie Coup. , MD  ASSISTANT: Romie Levee, MD  ANESTHESIA:   local and general  COUNTS:  Sponge, needle and instrument counts were reported correct x2 at the conclusion of the operation.  EBL: 50 mL  DRAINS: None  SPECIMEN: Ileostomy  COMPLICATIONS: None  FINDINGS: Flexible sigmoidoscopy demonstrated healthy appearing rectum and left colon.  Anastomosis is widely patent without any evident sinuses or disruptions.  There are no evident masses within the rectum or visualized portions of the colon.  Visualization is somewhat limited for smaller polyps due to much of the colon being coated with a thin layer of inspissated stool, difficult to wash free.  Loop ileostomy taken down uneventfully.  DISPOSITION: PACU in satisfactory condition  DESCRIPTION: The patient was identified in preop holding and taken to the OR where he was placed on the operating room table. SCDs were placed.  Arms were positioned out.  Pressure points were padded. General endotracheal anesthesia was induced without difficulty.  He was then placed in the lithotomy position with Allen stirrups.  A Foley catheter was placed by nursing.  The stoma appliance is removed.  Hair on the abdomen is clipped.  Pursestring suture was placed around the loop ileostomy at the level of the skin.  He was  then prepped and draped in the usual sterile fashion. A surgical timeout was performed indicating the correct patient, procedure, positioning and need for preoperative antibiotics.   In order to show ensure there are no adverse features or complications related to his colorectal anastomosis, we opted to begin with a flexible sigmoidoscopy.  I began by advancing a flexible sigmoidoscope through the anal canal under direct visualization into the distal rectum.  This was then advanced under direct visualization up into the left colon up to approximately the level of the splenic flexure.  This is then slowly withdrawn.  He does have inspissated stool coating much of his colon which does preclude visualization of any sort of smaller polypoid lesions.  This is difficult to clear with attempts with irrigation.  The colon is healthy in appearance.  We are able to visualize the anastomosis and there were no evident sinuses, defects, or masses.  The air within the colon is evacuated.  The scope was withdrawn.  Attention is then directed at the abdominal portion the procedure. We scrubbed in.  Local anesthetic is infiltrated around the ostomy site.  The skin is then incised and the ostomy is circumferentially freed from the subcutaneous tissue using sharp dissection with Metzenbaum scissors.  Arrival to maintain a plane between the serosa of the bowel and the subcutaneous fat without any serosal defects.  There is a portion on the lateral side that is dense adherent to the rectus fascia that does have 1 serosal injury inherent to the nature of the procedure.  Were able to then free the proximal and distal ends of the ileostomy from the rectus fascia and hernia  sac/peritoneum.  There were no significant adhesions within the abdomen around the ostomy.  Both limbs were then inspected.  On the distal limb approximately 2 cm beyond the ostomy there is 1 serosal injury where it was adhered to the fascia.  We opted to include  this with the planned resection of his ileostomy.  Windows were created in the mesentery on each respective side.  The proximal and distal limbs of the ileostomy and divided using a blue load GIA stapler.  The intervening mesentery is then clamped and divided.  This was then suture-ligated with 2-0 silk sutures.  Cut edge of the mesentery is inspected noted to be hemostatic.  Both proximal distal limbs were again inspected.  There are no evident injuries.  It is healthy in appearance.  There is no twisting.  Enterotomies are creating to respective limb just beyond each staple line.  A 75 mm GIA blue stapler was then used to create a side-to-side, functional end-to-end ilio ileal anastomosis.  The staple line is inspected noted have well-formed staples and is hemostatic.  The defects are elevated with Allis clamps.  This common enterotomy was then closed using an TA 60 blue load stapler.  The staple and is inspected noted to be hemostatic with well-formed staples.  The corners of the TA staple line were then dunked using 2-0 silk suture.  The anastomosis was palpated and found to be widely patent, greater than 3 fingerbreadths in diameter.  This is then placed back into the abdomen.  Fascia is identified.  It is dissected free of the overlying subcutaneous tissue.  The rectus fascia is then closed using 2 running #1 PDS sutures.  The closure was palpated noted to be complete without any gaps.  All sponge, needle, and instrument counts were reported correct.  The skin at the ileostomy site is then partially closed using a pursestring suture of 2-0 Vicryl.  The wound was washed and dried.  A moist 4 x 4 was then gently packed into the wound.  Additional 4 x 4's and ABD are placed over this.  This is secured with tape.   He is then awakened from anesthesia, extubated, and transferred to a stretcher transported to recovery in satisfactory condition.

## 2023-03-20 NOTE — Anesthesia Procedure Notes (Signed)
Procedure Name: Intubation Date/Time: 03/20/2023 8:43 AM  Performed by: Pearson Grippe, CRNAPre-anesthesia Checklist: Patient identified, Emergency Drugs available, Suction available and Patient being monitored Patient Re-evaluated:Patient Re-evaluated prior to induction Oxygen Delivery Method: Circle system utilized Preoxygenation: Pre-oxygenation with 100% oxygen Induction Type: IV induction Ventilation: Mask ventilation without difficulty Laryngoscope Size: Glidescope and 4 Grade View: Grade I Tube type: Oral Tube size: 7.5 mm Number of attempts: 1 Airway Equipment and Method: Stylet and Video-laryngoscopy Placement Confirmation: ETT inserted through vocal cords under direct vision, positive ETCO2 and breath sounds checked- equal and bilateral Secured at: 23 cm Tube secured with: Tape Dental Injury: Teeth and Oropharynx as per pre-operative assessment  Difficulty Due To: Difficulty was unanticipated, Difficult Airway- due to anterior larynx and Difficult Airway- due to immobile epiglottis Comments: Dlx1 by Merilynn Finland CRNA, grade III view, unable to advance ETT through glottis. Glidescope #4 x1, grade I view, ATOI.

## 2023-03-21 ENCOUNTER — Encounter (HOSPITAL_COMMUNITY): Payer: Self-pay | Admitting: Surgery

## 2023-03-21 ENCOUNTER — Other Ambulatory Visit (HOSPITAL_COMMUNITY): Payer: Self-pay

## 2023-03-21 LAB — GLUCOSE, CAPILLARY
Glucose-Capillary: 144 mg/dL — ABNORMAL HIGH (ref 70–99)
Glucose-Capillary: 134 mg/dL — ABNORMAL HIGH (ref 70–99)
Glucose-Capillary: 146 mg/dL — ABNORMAL HIGH (ref 70–99)
Glucose-Capillary: 116 mg/dL — ABNORMAL HIGH (ref 70–99)

## 2023-03-21 MED ORDER — TRAMADOL HCL 50 MG PO TABS
50.0000 mg | ORAL_TABLET | Freq: Four times a day (QID) | ORAL | 0 refills | Status: AC | PRN
Start: 2023-03-21 — End: 2023-03-26
  Filled 2023-03-21: qty 15, 4d supply, fill #0

## 2023-03-21 NOTE — Progress Notes (Signed)
  Subjective No acute events. Had some nausea yesterday following surgery. Feeling a bit better today. 1 episode emesis last night. Has had 2 BM today  Objective: Vital signs in last 24 hours: Temp:  [97.5 F (36.4 C)-99.2 F (37.3 C)] 98.4 F (36.9 C) (08/08 0553) Pulse Rate:  [51-76] 76 (08/08 0553) Resp:  [10-22] 18 (08/08 0553) BP: (136-199)/(84-114) 145/93 (08/08 0553) SpO2:  [92 %-96 %] 94 % (08/08 0553) Last BM Date : 03/19/23  Intake/Output from previous day: 08/07 0701 - 08/08 0700 In: 2705 [P.O.:800; I.V.:1805; IV Piggyback:100] Out: 1450 [Urine:1400; Blood:50] Intake/Output this shift: No intake/output data recorded.  Gen: NAD, comfortable CV: RRR Pulm: Normal work of breathing Abd: Soft, nontender; not significantly distended; former ostomy site dressed, no active drainage Ext: SCDs in place  Lab Results: CBC  Recent Labs    03/21/23 0523  WBC 13.6*  HGB 13.0  HCT 40.6  PLT 197   BMET Recent Labs    03/21/23 0523  NA 136  K 3.9  CL 102  CO2 24  GLUCOSE 152*  BUN 20  CREATININE 1.47*  CALCIUM 9.2   PT/INR No results for input(s): "LABPROT", "INR" in the last 72 hours. ABG No results for input(s): "PHART", "HCO3" in the last 72 hours.  Invalid input(s): "PCO2", "PO2"  Studies/Results:  Anti-infectives: Anti-infectives (From admission, onward)    Start     Dose/Rate Route Frequency Ordered Stop   03/20/23 0700  cefoTEtan (CEFOTAN) 2 g in sodium chloride 0.9 % 100 mL IVPB        2 g 200 mL/hr over 30 Minutes Intravenous On call to O.R. 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   AKI (acute kidney injury) (HCC) 09/27/2022   Dehydration 09/27/2022   Ileostomy care (HCC) 09/15/2022   Ileostomy in place [facility]) 08/24/2022   Ileostomy status (HCC) 08/23/2022   CAD (coronary artery disease) 06/20/2022   LVH (left ventricular hypertrophy) 06/19/2022   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   Rectal cancer (HCC) 05/23/2022   Plantar wart of left foot 12/05/2020   Neck pain 06/09/2020   Bilateral hand pain 06/09/2020   Ingrown toenail of both feet 06/16/2016   Esophageal reflux 12/08/2015   Back pain 12/08/2015   Arthralgia 01/11/2013   Hyperlipidemia LDL goal <70 11/09/2012   Type 2 diabetes mellitus with obesity (HCC) 11/09/2012   HTN (hypertension) 04/25/2012   s/p Procedure(s): TAKEDOWN OF DIVERTING LOOP ILEOSTOMY DIAGNOSTIC FLEXIBLE SIGMOIDOSCOPY 03/20/2023  -Ambulate 5x/day -Carb modified diet as tolerated -Foley out -MIVF - 50 cc/hr for now until reliably tolerating oral intake - taking some by mouth currently -PPx: SQH, SCDs -Spent time reviewing his procedure, findings, plans forward.  All questions were answered.  Expressed understanding and agreement with the plan.   LOS: 1 day   Marin Olp, MD [facility]

## 2023-03-21 NOTE — Progress Notes (Signed)
   03/21/23 1042  TOC Brief Assessment  Insurance and Status Reviewed  Patient has primary care physician Yes  Home environment has been reviewed home with spouse  Prior level of function: independent  Prior/Current Home Services No current home services  Social Determinants of Health Reivew SDOH reviewed no interventions necessary  Readmission risk has been reviewed Yes  Transition of care needs no transition of care needs at this time

## 2023-03-22 ENCOUNTER — Other Ambulatory Visit (HOSPITAL_COMMUNITY): Payer: Self-pay

## 2023-03-22 LAB — GLUCOSE, CAPILLARY: Glucose-Capillary: 138 mg/dL — ABNORMAL HIGH (ref 70–99)

## 2023-03-22 MED ORDER — LOPERAMIDE HCL 2 MG PO CAPS: 2.0000 mg | ORAL_CAPSULE | Freq: Two times a day (BID) | ORAL | Status: DC | PRN

## 2023-03-22 MED ORDER — LOPERAMIDE HCL 2 MG PO CAPS: 2.0000 mg | ORAL_CAPSULE | Freq: Two times a day (BID) | ORAL | Status: AC | PRN

## 2023-03-22 NOTE — Progress Notes (Addendum)
  Subjective No acute events. No nausea/vomiting. Passing flatus and having multiple BM now. Tolerating diet.  Objective: Vital signs in last 24 hours: Temp:  [98.6 F (37 C)-98.7 F (37.1 C)] 98.7 F (37.1 C) (08/09 0538) Pulse Rate:  [61-77] 70 (08/09 0538) Resp:  [16-18] 16 (08/09 0538) BP: (141-154)/(90-95) 147/91 (08/09 0543) SpO2:  [91 %-96 %] 92 % (08/09 0538) Weight:  [116.8 kg] 116.8 kg (08/09 0500) Last BM Date : 03/21/23  Intake/Output from previous day: 08/08 0701 - 08/09 0700 In: 2199.3 [P.O.:840; I.V.:1359.3] Out: 100 [Urine:100] Intake/Output this shift: No intake/output data recorded.  Gen: NAD, comfortable CV: RRR Pulm: Normal work of breathing Abd: Soft, nontender; not significantly distended; former ostomy site clean and dry, packing removed. Ext: SCDs in place  Lab Results: CBC  Recent Labs    03/21/23 0523 03/22/23 0427  WBC 13.6* 10.6*  HGB 13.0 10.4*  HCT 40.6 34.4*  PLT 197 158   BMET Recent Labs    03/21/23 0523 03/22/23 0427  NA 136 137  K 3.9 3.9  CL 102 105  CO2 24 25  GLUCOSE 152* 111*  BUN 20 18  CREATININE 1.47* 1.31*  CALCIUM 9.2 8.3*   PT/INR No results for input(s): "LABPROT", "INR" in the last 72 hours. ABG No results for input(s): "PHART", "HCO3" in the last 72 hours.  Invalid input(s): "PCO2", "PO2"  Studies/Results:  Anti-infectives: Anti-infectives (From admission, onward)    Start     Dose/Rate Route Frequency Ordered Stop   03/20/23 0700  cefoTEtan (CEFOTAN) 2 g in sodium chloride 0.9 % 100 mL IVPB        2 g 200 mL/hr over 30 Minutes Intravenous On call to O.R. 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   Coronary artery calcification 12/24/2022   Acute renal failure (ARF) (HCC) 12/05/2022   Known medical problems  11/30/2022   Port-A-Cath in place 10/10/2022   Postsurgical dumping syndrome 09/28/2022   AKI (acute kidney injury) (HCC) 09/27/2022   Dehydration 09/27/2022   Ileostomy care (HCC) 09/15/2022   Ileostomy in place [facility]) 08/24/2022   Ileostomy status (HCC) 08/23/2022   CAD (coronary artery disease) 06/20/2022   LVH (left ventricular hypertrophy) 06/19/2022   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  We spent time reviewing general expectations moving forward.  We discussed the wound care plan.  He is comfortable with and stable for discharge home. Discussed possibility of imodium once or twice a day as needed if stool consistency remains liquidy. All of his questions were answered.  He expressed understanding and agreement with the plan. -PPx: SQH, SCDs   LOS: 2 days   Marin Olp, MD [facility]

## 2023-03-22 NOTE — Plan of Care (Signed)
  Problem: Education: Goal: Understanding of discharge needs will improve Outcome: Adequate for Discharge Goal: Verbalization of understanding of the causes of altered bowel function will improve Outcome: Adequate for Discharge   Problem: Activity: Goal: Ability to tolerate increased activity will improve Outcome: Adequate for Discharge   Problem: Bowel/Gastric: Goal: Gastrointestinal status for postoperative course will improve Outcome: Adequate for Discharge   Problem: Health Behavior/Discharge Planning: Goal: Identification of community resources to assist with postoperative recovery needs will improve Outcome: Adequate for Discharge   Problem: Nutritional: Goal: Will attain and maintain optimal nutritional status will improve Outcome: Adequate for Discharge   Problem: Clinical Measurements: Goal: Postoperative complications will be avoided or minimized Outcome: Adequate for Discharge   Problem: Respiratory: Goal: Respiratory status will improve Outcome: Adequate for Discharge   Problem: Skin Integrity: Goal: Will show signs of wound healing Outcome: Adequate for Discharge   Problem: Education: Goal: Ability to describe self-care measures that may prevent or decrease complications (Diabetes Survival Skills Education) will improve Outcome: Adequate for Discharge Goal: Individualized Educational Video(s) Outcome: Adequate for Discharge   Problem: Coping: Goal: Ability to adjust to condition or change in health will improve Outcome: Adequate for Discharge   Problem: Fluid Volume: Goal: Ability to maintain a balanced intake and output will improve Outcome: Adequate for Discharge   Problem: Health Behavior/Discharge Planning: Goal: Ability to identify and utilize available resources and services will improve Outcome: Adequate for Discharge Goal: Ability to manage health-related needs will improve Outcome: Adequate for Discharge   Problem: Metabolic: Goal: Ability  to maintain appropriate glucose levels will improve Outcome: Adequate for Discharge   Problem: Nutritional: Goal: Maintenance of adequate nutrition will improve Outcome: Adequate for Discharge Goal: Progress toward achieving an optimal weight will improve Outcome: Adequate for Discharge   Problem: Skin Integrity: Goal: Risk for impaired skin integrity will decrease Outcome: Adequate for Discharge   Problem: Tissue Perfusion: Goal: Adequacy of tissue perfusion will improve Outcome: Adequate for Discharge   Problem: Education: Goal: Knowledge of General Education information will improve Description: Including pain rating scale, medication(s)/side effects and non-pharmacologic comfort measures Outcome: Adequate for Discharge   Problem: Health Behavior/Discharge Planning: Goal: Ability to manage health-related needs will improve Outcome: Adequate for Discharge   Problem: Clinical Measurements: Goal: Ability to maintain clinical measurements within normal limits will improve Outcome: Adequate for Discharge Goal: Will remain free from infection Outcome: Adequate for Discharge Goal: Diagnostic test results will improve Outcome: Adequate for Discharge Goal: Respiratory complications will improve Outcome: Adequate for Discharge Goal: Cardiovascular complication will be avoided Outcome: Adequate for Discharge   Problem: Activity: Goal: Risk for activity intolerance will decrease Outcome: Adequate for Discharge   Problem: Nutrition: Goal: Adequate nutrition will be maintained Outcome: Adequate for Discharge   Problem: Coping: Goal: Level of anxiety will decrease Outcome: Adequate for Discharge   Problem: Elimination: Goal: Will not experience complications related to bowel motility Outcome: Adequate for Discharge Goal: Will not experience complications related to urinary retention Outcome: Adequate for Discharge   Problem: Pain Managment: Goal: General experience of  comfort will improve Outcome: Adequate for Discharge   Problem: Safety: Goal: Ability to remain free from injury will improve Outcome: Adequate for Discharge   Problem: Skin Integrity: Goal: Risk for impaired skin integrity will decrease Outcome: Adequate for Discharge

## 2023-03-22 NOTE — Discharge Instructions (Signed)
POST OP INSTRUCTIONS AFTER COLON SURGERY  DIET: Be sure to include lots of fluids daily to stay hydrated - 64oz of water per day (8, 8 oz glasses).  Avoid fast food or heavy meals for the first couple of weeks as your are more likely to get nauseated. Avoid raw/uncooked fruits or vegetables for the first 4 weeks (its ok to have these if they are blended into smoothie form). If you have fruits/vegetables, make sure they are cooked until soft enough to mash on the roof of your mouth and chew your food well. Otherwise, diet as tolerated.  Take your usually prescribed home medications unless otherwise directed.  PAIN CONTROL: Pain is best controlled by a usual combination of three different methods TOGETHER: Ice/Heat Over the counter pain medication Prescription pain medication Most patients will experience some swelling and bruising around the surgical site.  Ice packs or heating pads (30-60 minutes up to 6 times a day) will help. Some people prefer to use ice alone, heat alone, alternating between ice & heat.  Experiment to what works for you.  Swelling and bruising can take several weeks to resolve.   It is helpful to take an over-the-counter pain medication regularly for the first few weeks: Ibuprofen (Motrin/Advil) - 200mg  tabs - take 3 tabs (600mg ) every 6 hours as needed for pain (unless you have been directed previously to avoid NSAIDs/ibuprofen) Acetaminophen (Tylenol) - you may take 650mg  every 6 hours as needed. You can take this with motrin as they act differently on the body. If you are taking a narcotic pain medication that has acetaminophen in it, do not take over the counter tylenol at the same time. NOTE: You may take both of these medications together - most patients  find it most helpful when alternating between the two (i.e. Ibuprofen at 6am, tylenol at 9am, ibuprofen at 12pm ..Marland Kitchen) A  prescription for pain medication should be given to you upon discharge.  Take your pain medication as  prescribed if your pain is not adequatly controlled with the over-the-counter pain reliefs mentioned above.  Avoid getting constipated.  Between the surgery and the pain medications, it is common to experience some constipation.  Increasing fluid intake and taking a fiber supplement (such as Metamucil, Citrucel, FiberCon, MiraLax, etc) 1-2 times a day regularly will usually help prevent this problem from occurring.  A mild laxative (prune juice, Milk of Magnesia, MiraLax, etc) should be taken according to package directions if there are no bowel movements after 48 hours.    Dressing: You may cover your former ostomy site with dry gauze and change twice daily.  It is okay to bathe normally and get this wound wet in the shower with soap and water in the wound.  This will actually help keep things clean.  No need to pack the wound.  ACTIVITIES as tolerated:   Avoid heavy lifting (>15lbs) for the next 6 weeks. You may resume regular daily activities as tolerated--such as daily self-care, walking, climbing stairs--gradually increasing activities as tolerated.  If you can walk 30 minutes without difficulty, it is safe to try more intense activity such as jogging, treadmill, bicycling, low-impact aerobics.  DO NOT PUSH THROUGH PAIN.  Let pain be your guide: If it hurts to do something, don't do it. You may drive when you are no longer taking prescription pain medication, you can comfortably wear a seatbelt, and you can safely maneuver your car and apply brakes.  FOLLOW UP in our office Please call [facility]  at [phone] to set up an appointment to see your surgeon in the office for a follow-up appointment approximately 2 weeks after your surgery. Make sure that you call for this appointment the day you arrive home to insure a convenient appointment time.  9. If you have disability or family leave forms that need to be completed, you may have them completed by your primary care physician's office; for return  to work instructions, please ask our office staff and they will be happy to assist you in obtaining this documentation   When to call us [phone]: Poor pain control Reactions / problems with new medications (rash/itching, etc)  Fever over 101.5 F (38.5 C) Inability to urinate Nausea/vomiting Worsening swelling or bruising Continued bleeding from incision. Increased pain, redness, or drainage from the incision  The clinic staff is available to answer your questions during regular business hours (8:30am-5pm).  Please don't hesitate to call and ask to speak to one of our nurses for clinical concerns.   A surgeon from [facility] is always on call at the hospitals   If you have a medical emergency, go to the nearest emergency room or call 911.  [facility] [address], [place], [place]  [address] MAIN: [id number] FAX: [phone] [email or web address]

## 2023-03-22 NOTE — Discharge Summary (Signed)
Patient ID: Robert [name] MRN: [id number] DOB/AGE: 1970-11-12 52 y.o.  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   AKI (acute kidney injury) (HCC) 09/27/2022   Dehydration 09/27/2022   Ileostomy care (HCC) 09/15/2022   Ileostomy in place [facility]) 08/24/2022   Ileostomy status (HCC) 08/23/2022   CAD (coronary artery disease) 06/20/2022   LVH (left ventricular hypertrophy) 06/19/2022   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  He had an uneventful recovery.  He began having spontaneous bowel function. On 03/22/2023, he is tolerating a diet, moving his bowels, ambulating, comfortable with and stable for discharge home.    Allergies as of 03/22/2023       Reactions   Advil [ibuprofen] Shortness Of Breath   Aleve [naproxen] Shortness Of Breath   Mucinex [guaifenesin Er] Shortness Of Breath, Itching, Swelling, Dermatitis, Rash, Other (See Comments)   Swelling of hands and feet   Nsaids Shortness Of Breath, Itching, Swelling, Rash,  Other (See Comments)   Aleve and Advil    Levaquin [levofloxacin]    Aortic aneurysm; not recommend in this condition unless no other agents would cover   Latex Rash, Other (See Comments)   "Cartilage will harden"        Medication List     TAKE these medications    amLODipine 5 MG tablet Commonly known as: NORVASC TAKE 1 TABLET (5 MG TOTAL) BY MOUTH DAILY. What changed:  when to take this reasons to take this   carvedilol 25 MG tablet Commonly known as: COREG Take 1 tablet (25 mg total) by mouth 2 (two) times daily.   Cool Blood Glucose Test Strips test strip Generic drug: glucose blood Check blood sugar once daily   famotidine 40 MG tablet Commonly known as: PEPCID Take 1 tablet (40 mg total) by mouth at bedtime.   lidocaine-prilocaine cream Commonly known as: EMLA Apply 1 Application topically as needed (for port access).   loperamide 2 MG capsule Commonly known as: IMODIUM Take 1 capsule (2 mg total) by mouth 2 (two) times daily as needed for up to 10 days for diarrhea or loose stools.   metFORMIN 500 MG tablet Commonly known as: GLUCOPHAGE TAKE 1 TABLET BY MOUTH TWICE A DAY   ondansetron 8 MG tablet Commonly known as: ZOFRAN Take 8 mg by mouth every 6 (six) hours as needed for nausea or vomiting.   pantoprazole 40 MG tablet Commonly known as: PROTONIX Take 1 tablet (40 mg total) by mouth daily.   prochlorperazine 10 MG tablet Commonly known as: COMPAZINE Take 10 mg by mouth every 6 (six)  hours as needed for nausea or vomiting.   sitaGLIPtin 100 MG tablet Commonly known as: Januvia Take 1 tablet (100 mg total) by mouth daily.   tiZANidine 2 MG tablet Commonly known as: ZANAFLEX Take 1-2 tablets (2-4 mg total) by mouth 3 (three) times daily as needed for muscle spasms.   traMADol 50 MG tablet Commonly known as: Ultram Take 1 tablet (50 mg total) by mouth every 6 (six) hours as needed for up to 5 days (postop pain not controlled with tylenol  first).          Follow-up Information     Andria Meuse, MD Follow up on 04/16/2023.   Specialties: General Surgery, Colon and Rectal Surgery Why: Please arrive by 10:00 am Contact information: [address] [place] [address] [phone]                 Stephanie Coup. Cliffton Asters, M.D. [facility]

## 2023-03-22 NOTE — Plan of Care (Signed)
  Problem: Education: Goal: Understanding of discharge needs will improve Outcome: Progressing Goal: Verbalization of understanding of the causes of altered bowel function will improve Outcome: Progressing   Problem: Activity: Goal: Ability to tolerate increased activity will improve Outcome: Progressing   Problem: Bowel/Gastric: Goal: Gastrointestinal status for postoperative course will improve Outcome: Progressing   Problem: Nutritional: Goal: Will attain and maintain optimal nutritional status will improve Outcome: Progressing

## 2023-03-25 ENCOUNTER — Telehealth: Payer: Self-pay

## 2023-03-25 NOTE — Transitions of Care (Post Inpatient/ED Visit) (Signed)
   03/25/2023  Name: Robert [name] MRN: [id number] DOB: 14-May-1971  Today's TOC FU Call Status: Today's TOC FU Call Status:: Unsuccessful Call (1st Attempt) Unsuccessful Call (1st Attempt) Date: 03/25/23  Attempted to reach the patient regarding the most recent Inpatient/ED visit.  Follow Up Plan: Additional outreach attempts will be made to reach the patient to complete the Transitions of Care (Post Inpatient/ED visit) call.   Signature Karena Addison, LPN [facility] Nurse Health Advisor Direct Dial [phone]

## 2023-03-26 NOTE — Transitions of Care (Post Inpatient/ED Visit) (Signed)
   03/26/2023  Name: Robert [name] MRN: [id number] DOB: 10-06-1970  Today's TOC FU Call Status: Today's TOC FU Call Status:: Unsuccessful Call (2nd Attempt) Unsuccessful Call (1st Attempt) Date: 03/25/23 Unsuccessful Call (2nd Attempt) Date: 03/26/23  Attempted to reach the patient regarding the most recent Inpatient/ED visit.  Follow Up Plan: Additional outreach attempts will be made to reach the patient to complete the Transitions of Care (Post Inpatient/ED visit) call.   Signature Karena Addison, LPN [facility] Nurse Health Advisor Direct Dial [phone]

## 2023-03-28 ENCOUNTER — Encounter: Payer: Self-pay | Admitting: Hematology

## 2023-03-29 ENCOUNTER — Other Ambulatory Visit: Payer: Self-pay

## 2023-03-29 ENCOUNTER — Other Ambulatory Visit: Payer: Self-pay | Admitting: Nurse Practitioner

## 2023-03-29 DIAGNOSIS — M792 Neuralgia and neuritis, unspecified: Secondary | ICD-10-CM

## 2023-03-29 MED ORDER — GABAPENTIN 100 MG PO CAPS
100.0000 mg | ORAL_CAPSULE | Freq: Three times a day (TID) | ORAL | 0 refills | Status: DC
Start: 2023-03-29 — End: 2023-05-06

## 2023-03-29 NOTE — Telephone Encounter (Signed)
Robert [name]. I sent a prescription for gabapentin 100 mg to [facility] for him. Please advise him to start with 1 capsule. He should start with once. This may be increased up to two capsules up to three times daily as needed. He should increase the dose slowly and only if tolerated ok. Thanks .

## 2023-04-01 ENCOUNTER — Telehealth: Payer: Self-pay

## 2023-04-01 NOTE — Telephone Encounter (Signed)
Spoke with pt via telephone and to see if he picked up the Gabapentin prescription.  Pt stated he's currently taking 2 tabs daily (1 in the morning and 1 at bedtime).  Pt stated he's getting some relief but not much.  Instructed pt to increase his Gabapentin dose to 3 tabs.  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  Encouraged pt again to wear hot to warm socks on his hand & feet.  Pt verbalized understanding and instructed pt to contact our office should he have additional questions or concerns.

## 2023-04-02 NOTE — Telephone Encounter (Signed)
Wonderful. Thank you. If still having problems, it's reasonable to increase the gabapentin to 2 capsules TID for a time. Then he might need to be seen.

## 2023-04-03 NOTE — Transitions of Care (Post Inpatient/ED Visit) (Signed)
   04/03/2023  Name: Robert [name] MRN: [id number] DOB: January 13, 1971  Today's TOC FU Call Status: Today's TOC FU Call Status:: Unsuccessful Call (3rd Attempt) Unsuccessful Call (1st Attempt) Date: 03/25/23 Unsuccessful Call (2nd Attempt) Date: 03/26/23 Unsuccessful Call (3rd Attempt) Date: 04/03/23  Attempted to reach the patient regarding the most recent Inpatient/ED visit.  Follow Up Plan: No further outreach attempts will be made at this time. We have been unable to contact the patient.  Signature  Karena Addison, LPN [facility] Nurse Health Advisor Direct Dial [phone]

## 2023-04-04 ENCOUNTER — Ambulatory Visit: Payer: BLUE CROSS/BLUE SHIELD | Admitting: Family Medicine

## 2023-04-13 ENCOUNTER — Encounter: Payer: Self-pay | Admitting: Hematology

## 2023-04-17 ENCOUNTER — Telehealth: Payer: Self-pay

## 2023-04-17 NOTE — Telephone Encounter (Signed)
LVM for pt asking if the pt would like to have his portacath removed.  If so, stated that Dr. Mosetta Putt will contact Dr. Cliffton Asters about removing the pt's port.  Requested if the pt would please contact Dr. Latanya Maudlin office to make [place] aware of his decision.  Awaiting pt's response.

## 2023-04-19 ENCOUNTER — Telehealth: Payer: Self-pay | Admitting: Hematology

## 2023-04-19 ENCOUNTER — Encounter (HOSPITAL_COMMUNITY): Payer: Self-pay

## 2023-04-19 NOTE — Patient Instructions (Signed)
SURGICAL WAITING ROOM VISITATION Patients having surgery or a procedure may have no more than 2 support people in the waiting area - these visitors may rotate.    Children under the age of 62 must have an adult with them who is not the patient.  If the patient needs to stay at the hospital during part of their recovery, the visitor guidelines for inpatient rooms apply. Pre-op nurse will coordinate an appropriate time for 1 support person to accompany patient in pre-op.  This support person may not rotate.    Please refer to the [facility] website for the visitor guidelines for Inpatients (after your surgery is over and you are in a regular room).       Your procedure is scheduled on: 05-03-23   Report to [facility] Main Entrance    Report to admitting at 7:45 AM   Call this number if you have problems the morning of surgery [phone]   Do not eat food or drink liquids :After Midnight.           If you have questions, please contact your surgeon's office.   FOLLOW  ANY ADDITIONAL PRE OP INSTRUCTIONS YOU RECEIVED FROM YOUR SURGEON'S OFFICE!!!     Oral Hygiene is also important to reduce your risk of infection.                                    Remember - BRUSH YOUR TEETH THE MORNING OF SURGERY WITH YOUR REGULAR TOOTHPASTE   Do NOT smoke after Midnight   Take these medicines the morning of surgery with A SIP OF WATER:   Amlodipine  Carvedilol  Gabapentin  Pantoprazole  Stop all vitamins and herbal supplements 7 days before surgery  How to Manage Your Diabetes Before and After Surgery  Why is it important to control my blood sugar before and after surgery? Improving blood sugar levels before and after surgery helps healing and can limit problems. A way of improving blood sugar control is eating a healthy diet by:  Eating less sugar and carbohydrates  Increasing activity/exercise  Talking with your doctor about reaching your blood sugar goals High blood  sugars (greater than 180 mg/dL) can raise your risk of infections and slow your recovery, so you will need to focus on controlling your diabetes during the weeks before surgery. Make sure that the doctor who takes care of your diabetes knows about your planned surgery including the date and location.  How do I manage my blood sugar before surgery? Check your blood sugar at least 4 times a day, starting 2 days before surgery, to make sure that the level is not too high or low. Check your blood sugar the morning of your surgery when you wake up and every 2 hours until you get to the Short Stay unit. If your blood sugar is less than 70 mg/dL, you will need to treat for low blood sugar: Do not take insulin. Treat a low blood sugar (less than 70 mg/dL) with  cup of clear juice (cranberry or apple), 4 glucose tablets, OR glucose gel. Recheck blood sugar in 15 minutes after treatment (to make sure it is greater than 70 mg/dL). If your blood sugar is not greater than 70 mg/dL on recheck, call [phone] for further instructions. Report your blood sugar to the short stay nurse when you get to Short Stay.  If you are admitted to  the hospital after surgery: Your blood sugar will be checked by the staff and you will probably be given insulin after surgery (instead of oral diabetes medicines) to make sure you have good blood sugar levels. The goal for blood sugar control after surgery is 80-180 mg/dL.   WHAT DO I DO ABOUT MY DIABETES MEDICATION?  Do not take oral diabetes medicines (pills) the morning of surgery.  DO NOT TAKE THE FOLLOWING 7 DAYS PRIOR TO SURGERY: Ozempic, Wegovy, Rybelsus (Semaglutide), Byetta (exenatide), Bydureon (exenatide ER), Victoza, Saxenda (liraglutide), or Trulicity (dulaglutide) Mounjaro (Tirzepatide) Adlyxin (Lixisenatide), Polyethylene Glycol Loxenatide.   Reviewed and Endorsed by [facility] Patient Education Committee, August 2015                              You may  not have any metal on your body including  jewelry, and body piercing             Do not wear  lotions, powders, cologne, or deodorant              Men may shave face and neck.   Do not bring valuables to the hospital. [place] IS NOT RESPONSIBLE   FOR VALUABLES.   Contacts, dentures or bridgework may not be worn into surgery.  DO NOT BRING YOUR HOME MEDICATIONS TO THE HOSPITAL. PHARMACY WILL DISPENSE MEDICATIONS LISTED ON YOUR MEDICATION LIST TO YOU DURING YOUR ADMISSION IN THE HOSPITAL!    Patients discharged on the day of surgery will not be allowed to drive home.  Someone NEEDS to stay with you for the first 24 hours after anesthesia.               Please read over the following fact sheets you were given: IF YOU HAVE QUESTIONS ABOUT YOUR PRE-OP INSTRUCTIONS PLEASE CALL [phone] Gwen  If you received a COVID test during your pre-op visit  it is requested that you wear a mask when out in public, stay away from anyone that may not be feeling well and notify your surgeon if you develop symptoms. If you test positive for Covid or have been in contact with anyone that has tested positive in the last 10 days please notify you surgeon.  [place] - Preparing for Surgery Before surgery, you can play an important role.  Because skin is not sterile, your skin needs to be as free of germs as possible.  You can reduce the number of germs on your skin by washing with CHG (chlorahexidine gluconate) soap before surgery.  CHG is an antiseptic cleaner which kills germs and bonds with the skin to continue killing germs even after washing. Please DO NOT use if you have an allergy to CHG or antibacterial soaps.  If your skin becomes reddened/irritated stop using the CHG and inform your nurse when you arrive at Short Stay. Do not shave (including legs and underarms) for at least 48 hours prior to the first CHG shower.  You may shave your face/neck.  Please follow these instructions carefully:  1.   Shower with CHG Soap the night before surgery and the  morning of surgery.  2.  If you choose to wash your hair, wash your hair first as usual with your normal  shampoo.  3.  After you shampoo, rinse your hair and body thoroughly to remove the shampoo.  4.  Use CHG as you would any other liquid soap.  You can apply chg directly to the skin and wash.  Gently with a scrungie or clean washcloth.  5.  Apply the CHG Soap to your body ONLY FROM THE NECK DOWN.   Do   not use on face/ open                           Wound or open sores. Avoid contact with eyes, ears mouth and   genitals (private parts).                       Wash face,  Genitals (private parts) with your normal soap.             6.  Wash thoroughly, paying special attention to the area where your    surgery  will be performed.  7.  Thoroughly rinse your body with warm water from the neck down.  8.  DO NOT shower/wash with your normal soap after using and rinsing off the CHG Soap.                9.  Pat yourself dry with a clean towel.            10.  Wear clean pajamas.            11.  Place clean sheets on your bed the night of your first shower and do not  sleep with pets. Day of Surgery : Do not apply any lotions/deodorants the morning of surgery.  Please wear clean clothes to the hospital/surgery center.  FAILURE TO FOLLOW THESE INSTRUCTIONS MAY RESULT IN THE CANCELLATION OF YOUR SURGERY  PATIENT SIGNATURE_________________________________  NURSE SIGNATURE__________________________________  ________________________________________________________________________

## 2023-04-22 ENCOUNTER — Ambulatory Visit: Payer: Self-pay | Admitting: Surgery

## 2023-04-22 NOTE — Progress Notes (Signed)
Surgery orders requested via Epic inbox. °

## 2023-04-22 NOTE — Progress Notes (Signed)
COVID Vaccine Completed:  Date of COVID positive in last 90 days:  PCP - Danise Edge, MD Cardiologist - Riley Lam, MD  Chest x-ray - 09-21-22 Epic EKG - 09-28-22 Epic Stress Test -  ECHO - 01-17-23 Epic Cardiac Cath -  Pacemaker/ICD device last checked: Spinal Cord Stimulator: Cardiac MR - 10-22-22  Bowel Prep -   Sleep Study - Yes,  CPAP -   Fasting Blood Sugar -  Checks Blood Sugar _____ times a day  Last dose of GLP1 agonist-  N/A GLP1 instructions:  N/A   Last dose of SGLT-2 inhibitors-  N/A SGLT-2 instructions: N/A   Blood Thinner Instructions:  Time Aspirin Instructions: Last Dose:  Activity level:  Can go up a flight of stairs and perform activities of daily living without stopping and without symptoms of chest pain or shortness of breath.  Able to exercise without symptoms  Unable to go up a flight of stairs without symptoms of     Anesthesia review:  CAD, LBBB, HTN, DM ,  aortic aneurysm  Patient denies shortness of breath, fever, cough and chest pain at PAT appointment  Patient verbalized understanding of instructions that were given to them at the PAT appointment. Patient was also instructed that they will need to review over the PAT instructions again at home before surgery.

## 2023-04-23 ENCOUNTER — Ambulatory Visit: Payer: Self-pay | Admitting: Surgery

## 2023-04-24 ENCOUNTER — Encounter (HOSPITAL_COMMUNITY): Payer: Self-pay

## 2023-04-24 ENCOUNTER — Other Ambulatory Visit: Payer: Self-pay

## 2023-04-24 ENCOUNTER — Encounter (HOSPITAL_COMMUNITY)
Admission: RE | Admit: 2023-04-24 | Discharge: 2023-04-24 | Disposition: A | Discharge status: Home / Self Care | Payer: BLUE CROSS/BLUE SHIELD | Source: Ambulatory Visit | Attending: Surgery | Admitting: Surgery

## 2023-04-24 VITALS — BP 159/92 | HR 63 | Temp 98.4°F | Resp 16 | Ht 75.0 in | Wt 257.4 lb

## 2023-04-24 DIAGNOSIS — I10 Essential (primary) hypertension: Secondary | ICD-10-CM | POA: Diagnosis not present

## 2023-04-24 DIAGNOSIS — Z01812 Encounter for preprocedural laboratory examination: Secondary | ICD-10-CM | POA: Diagnosis present

## 2023-04-24 DIAGNOSIS — E119 Type 2 diabetes mellitus without complications: Secondary | ICD-10-CM | POA: Insufficient documentation

## 2023-04-24 DIAGNOSIS — D649 Anemia, unspecified: Secondary | ICD-10-CM | POA: Diagnosis not present

## 2023-04-24 HISTORY — DX: Polyneuropathy, unspecified: G62.9

## 2023-04-24 LAB — CBC
HCT: 29.2 % — ABNORMAL LOW (ref 39.0–52.0)
Hemoglobin: 8.6 g/dL — ABNORMAL LOW (ref 13.0–17.0)
MCH: 24.9 pg — ABNORMAL LOW (ref 26.0–34.0)
MCHC: 29.5 g/dL — ABNORMAL LOW (ref 30.0–36.0)
MCV: 84.6 fL (ref 80.0–100.0)
Platelets: 181 10*3/uL (ref 150–400)
RBC: 3.45 MIL/uL — ABNORMAL LOW (ref 4.22–5.81)
RDW: 14.6 % (ref 11.5–15.5)
WBC: 5.3 10*3/uL (ref 4.0–10.5)
nRBC: 0 % (ref 0.0–0.2)

## 2023-04-24 LAB — BASIC METABOLIC PANEL
Anion gap: 8 (ref 5–15)
BUN: 11 mg/dL (ref 6–20)
CO2: 25 mmol/L (ref 22–32)
Calcium: 8.6 mg/dL — ABNORMAL LOW (ref 8.9–10.3)
Chloride: 106 mmol/L (ref 98–111)
Creatinine, Ser: 1.33 mg/dL — ABNORMAL HIGH (ref 0.61–1.24)
GFR, Estimated: >60 mL/min (ref >60)
Glucose, Bld: 115 mg/dL — ABNORMAL HIGH (ref 70–99)
Potassium: 3.7 mmol/L (ref 3.5–5.1)
Sodium: 139 mmol/L (ref 135–145)

## 2023-04-24 LAB — GLUCOSE, CAPILLARY: Glucose-Capillary: 117 mg/dL — ABNORMAL HIGH (ref 70–99)

## 2023-05-03 ENCOUNTER — Other Ambulatory Visit: Payer: Self-pay

## 2023-05-03 ENCOUNTER — Encounter (HOSPITAL_COMMUNITY): Payer: Self-pay | Admitting: Surgery

## 2023-05-03 ENCOUNTER — Ambulatory Visit (HOSPITAL_COMMUNITY): Payer: BLUE CROSS/BLUE SHIELD | Admitting: Anesthesiology

## 2023-05-03 ENCOUNTER — Encounter (HOSPITAL_COMMUNITY)
Admission: RE | Disposition: A | Discharge status: Home / Self Care | Payer: Self-pay | Source: Ambulatory Visit | Attending: Surgery

## 2023-05-03 ENCOUNTER — Ambulatory Visit (HOSPITAL_COMMUNITY): Payer: BLUE CROSS/BLUE SHIELD | Admitting: Physician Assistant

## 2023-05-03 ENCOUNTER — Ambulatory Visit (HOSPITAL_COMMUNITY)
Admission: RE | Admit: 2023-05-03 | Discharge: 2023-05-03 | Disposition: A | Discharge status: Home / Self Care | Payer: BLUE CROSS/BLUE SHIELD | Source: Ambulatory Visit | Attending: Surgery | Admitting: Surgery

## 2023-05-03 DIAGNOSIS — I2510 Atherosclerotic heart disease of native coronary artery without angina pectoris: Secondary | ICD-10-CM | POA: Insufficient documentation

## 2023-05-03 DIAGNOSIS — K219 Gastro-esophageal reflux disease without esophagitis: Secondary | ICD-10-CM | POA: Diagnosis not present

## 2023-05-03 DIAGNOSIS — I10 Essential (primary) hypertension: Secondary | ICD-10-CM | POA: Insufficient documentation

## 2023-05-03 DIAGNOSIS — Z452 Encounter for adjustment and management of vascular access device: Secondary | ICD-10-CM | POA: Diagnosis present

## 2023-05-03 DIAGNOSIS — Z7984 Long term (current) use of oral hypoglycemic drugs: Secondary | ICD-10-CM | POA: Insufficient documentation

## 2023-05-03 DIAGNOSIS — E1140 Type 2 diabetes mellitus with diabetic neuropathy, unspecified: Secondary | ICD-10-CM | POA: Insufficient documentation

## 2023-05-03 DIAGNOSIS — Z8249 Family history of ischemic heart disease and other diseases of the circulatory system: Secondary | ICD-10-CM | POA: Diagnosis not present

## 2023-05-03 DIAGNOSIS — Z833 Family history of diabetes mellitus: Secondary | ICD-10-CM | POA: Diagnosis not present

## 2023-05-03 DIAGNOSIS — Z87891 Personal history of nicotine dependence: Secondary | ICD-10-CM | POA: Diagnosis not present

## 2023-05-03 DIAGNOSIS — E119 Type 2 diabetes mellitus without complications: Secondary | ICD-10-CM

## 2023-05-03 HISTORY — PX: PORT-A-CATH REMOVAL: SHX5289

## 2023-05-03 LAB — CBC WITH DIFFERENTIAL/PLATELET
Abs Immature Granulocytes: 0.02 10*3/uL (ref 0.00–0.07)
Basophils Absolute: 0 10*3/uL (ref 0.0–0.1)
Basophils Relative: 1 %
Eosinophils Absolute: 0.3 10*3/uL (ref 0.0–0.5)
Eosinophils Relative: 5 %
HCT: 29.5 % — ABNORMAL LOW (ref 39.0–52.0)
Hemoglobin: 8.6 g/dL — ABNORMAL LOW (ref 13.0–17.0)
Immature Granulocytes: 0 %
Lymphocytes Relative: 30 %
Lymphs Abs: 1.6 10*3/uL (ref 0.7–4.0)
MCH: 23.9 pg — ABNORMAL LOW (ref 26.0–34.0)
MCHC: 29.2 g/dL — ABNORMAL LOW (ref 30.0–36.0)
MCV: 81.9 fL (ref 80.0–100.0)
Monocytes Absolute: 0.5 10*3/uL (ref 0.1–1.0)
Monocytes Relative: 9 %
Neutro Abs: 2.9 10*3/uL (ref 1.7–7.7)
Neutrophils Relative %: 55 %
Platelets: 203 10*3/uL (ref 150–400)
RBC: 3.6 MIL/uL — ABNORMAL LOW (ref 4.22–5.81)
RDW: 15 % (ref 11.5–15.5)
WBC: 5.2 10*3/uL (ref 4.0–10.5)
nRBC: 0 % (ref 0.0–0.2)

## 2023-05-03 LAB — GLUCOSE, CAPILLARY
Glucose-Capillary: 126 mg/dL — ABNORMAL HIGH (ref 70–99)
Glucose-Capillary: 115 mg/dL — ABNORMAL HIGH (ref 70–99)
Glucose-Capillary: 126 mg/dL — ABNORMAL HIGH (ref 70–99)

## 2023-05-03 SURGERY — REMOVAL PORT-A-CATH
Anesthesia: Monitor Anesthesia Care

## 2023-05-03 MED ORDER — FENTANYL CITRATE PF 50 MCG/ML IJ SOSY: 25.0000 ug | PREFILLED_SYRINGE | INTRAMUSCULAR | Status: DC | PRN

## 2023-05-03 MED ORDER — ONDANSETRON HCL 4 MG/2ML IJ SOLN
INTRAMUSCULAR | Status: DC | PRN
  Administered 2023-05-03: 4 mg via INTRAVENOUS

## 2023-05-03 MED ORDER — INSULIN ASPART 100 UNIT/ML IJ SOLN: 0.0000 [IU] | INTRAMUSCULAR | Status: DC | PRN

## 2023-05-03 MED ORDER — LIDOCAINE HCL 1 % IJ SOLN
INTRAMUSCULAR | Status: AC
  Filled 2023-05-03: qty 20

## 2023-05-03 MED ORDER — TRAMADOL HCL 50 MG PO TABS
50.0000 mg | ORAL_TABLET | Freq: Four times a day (QID) | ORAL | 0 refills | Status: AC | PRN
Start: 2023-05-03 — End: 2023-05-08

## 2023-05-03 MED ORDER — SODIUM CHLORIDE (PF) 0.9 % IJ SOLN
INTRAMUSCULAR | Status: AC
  Filled 2023-05-03: qty 20

## 2023-05-03 MED ORDER — LACTATED RINGERS IV SOLN: INTRAVENOUS | Status: DC

## 2023-05-03 MED ORDER — BUPIVACAINE-EPINEPHRINE 0.5% -1:200000 IJ SOLN
INTRAMUSCULAR | Status: DC | PRN
  Administered 2023-05-03: 5 mL

## 2023-05-03 MED ORDER — FENTANYL CITRATE (PF) 100 MCG/2ML IJ SOLN
INTRAMUSCULAR | Status: AC
  Filled 2023-05-03: qty 2

## 2023-05-03 MED ORDER — FENTANYL CITRATE (PF) 100 MCG/2ML IJ SOLN
INTRAMUSCULAR | Status: DC | PRN
  Administered 2023-05-03: 25 ug via INTRAVENOUS

## 2023-05-03 MED ORDER — MIDAZOLAM HCL 2 MG/2ML IJ SOLN
INTRAMUSCULAR | Status: AC
  Filled 2023-05-03: qty 2

## 2023-05-03 MED ORDER — DEXAMETHASONE SODIUM PHOSPHATE 10 MG/ML IJ SOLN
INTRAMUSCULAR | Status: AC
  Filled 2023-05-03: qty 1

## 2023-05-03 MED ORDER — MIDAZOLAM HCL 5 MG/5ML IJ SOLN
INTRAMUSCULAR | Status: DC | PRN
  Administered 2023-05-03: 2 mg via INTRAVENOUS

## 2023-05-03 MED ORDER — ORAL CARE MOUTH RINSE: 15.0000 mL | Freq: Once | OROMUCOSAL | Status: AC

## 2023-05-03 MED ORDER — ACETAMINOPHEN 500 MG PO TABS
1000.0000 mg | ORAL_TABLET | ORAL | Status: AC
  Administered 2023-05-03: 1000 mg via ORAL
  Filled 2023-05-03: qty 2

## 2023-05-03 MED ORDER — BUPIVACAINE-EPINEPHRINE (PF) 0.5% -1:200000 IJ SOLN
INTRAMUSCULAR | Status: AC
  Filled 2023-05-03: qty 30

## 2023-05-03 MED ORDER — CHLORHEXIDINE GLUCONATE CLOTH 2 % EX PADS: 6.0000 | MEDICATED_PAD | Freq: Once | CUTANEOUS | Status: DC

## 2023-05-03 MED ORDER — 0.9 % SODIUM CHLORIDE (POUR BTL) OPTIME
TOPICAL | Status: DC | PRN
Start: 2023-05-03 — End: 2023-05-03
  Administered 2023-05-03: 1000 mL

## 2023-05-03 MED ORDER — CEFAZOLIN SODIUM-DEXTROSE 2-4 GM/100ML-% IV SOLN
2.0000 g | INTRAVENOUS | Status: AC
  Administered 2023-05-03: 2 g via INTRAVENOUS
  Filled 2023-05-03: qty 100

## 2023-05-03 MED ORDER — PROPOFOL 500 MG/50ML IV EMUL
INTRAVENOUS | Status: DC | PRN
  Administered 2023-05-03: 75 ug/kg/min via INTRAVENOUS

## 2023-05-03 MED ORDER — CHLORHEXIDINE GLUCONATE 0.12 % MT SOLN
15.0000 mL | Freq: Once | OROMUCOSAL | Status: AC
  Administered 2023-05-03: 15 mL via OROMUCOSAL

## 2023-05-03 MED ORDER — ONDANSETRON HCL 4 MG/2ML IJ SOLN
INTRAMUSCULAR | Status: AC
  Filled 2023-05-03: qty 2

## 2023-05-03 MED ORDER — GLYCOPYRROLATE 0.2 MG/ML IJ SOLN
INTRAMUSCULAR | Status: DC | PRN
Start: 2023-05-03 — End: 2023-05-03
  Administered 2023-05-03 (×2): .1 mg via INTRAVENOUS

## 2023-05-03 MED ORDER — LIDOCAINE HCL (CARDIAC) PF 100 MG/5ML IV SOSY
PREFILLED_SYRINGE | INTRAVENOUS | Status: DC | PRN
  Administered 2023-05-03: 40 mg via INTRAVENOUS

## 2023-05-03 MED ORDER — LIDOCAINE HCL (PF) 2 % IJ SOLN
INTRAMUSCULAR | Status: AC
  Filled 2023-05-03: qty 5

## 2023-05-03 MED ORDER — PROPOFOL 500 MG/50ML IV EMUL
INTRAVENOUS | Status: AC
  Filled 2023-05-03: qty 100

## 2023-05-03 MED ORDER — PROPOFOL 10 MG/ML IV BOLUS
INTRAVENOUS | Status: DC | PRN
  Administered 2023-05-03: 50 mg via INTRAVENOUS
  Administered 2023-05-03: 20 mg via INTRAVENOUS
  Administered 2023-05-03: 30 mg via INTRAVENOUS

## 2023-05-03 MED ORDER — PROPOFOL 10 MG/ML IV BOLUS
INTRAVENOUS | Status: AC
  Filled 2023-05-03: qty 20

## 2023-05-03 SURGICAL SUPPLY — 32 items
ADH SKN CLS APL DERMABOND .7 (GAUZE/BANDAGES/DRESSINGS) ×1
BAG COUNTER SPONGE SURGICOUNT (BAG) IMPLANT
BAG SPNG CNTER NS LX DISP (BAG)
BLADE HEX COATED 2.75 (ELECTRODE) ×1 IMPLANT
BLADE SURG 15 STRL LF DISP TIS (BLADE) ×1 IMPLANT
BLADE SURG 15 STRL SS (BLADE) ×1
DERMABOND ADVANCED .7 DNX12 (GAUZE/BANDAGES/DRESSINGS) ×1 IMPLANT
DRAPE LAPAROTOMY TRNSV 102X78 (DRAPES) ×1 IMPLANT
ELECT REM PT RETURN 15FT ADLT (MISCELLANEOUS) ×1 IMPLANT
GAUZE SPONGE 4X4 12PLY STRL (GAUZE/BANDAGES/DRESSINGS) IMPLANT
GLOVE BIO SURGEON STRL SZ7.5 (GLOVE) ×1 IMPLANT
GLOVE BIOGEL PI IND STRL 7.0 (GLOVE) ×1 IMPLANT
GOWN STRL REUS W/ TWL LRG LVL3 (GOWN DISPOSABLE) ×1 IMPLANT
GOWN STRL REUS W/ TWL XL LVL3 (GOWN DISPOSABLE) ×1 IMPLANT
GOWN STRL REUS W/TWL LRG LVL3 (GOWN DISPOSABLE) ×1
GOWN STRL REUS W/TWL XL LVL3 (GOWN DISPOSABLE) ×1
KIT BASIN OR (CUSTOM PROCEDURE TRAY) ×1 IMPLANT
KIT TURNOVER KIT A (KITS) IMPLANT
NDL HYPO 22X1.5 SAFETY MO (MISCELLANEOUS) IMPLANT
NDL HYPO 25X1 1.5 SAFETY (NEEDLE) ×1 IMPLANT
NEEDLE HYPO 22X1.5 SAFETY MO (MISCELLANEOUS)
NEEDLE HYPO 25X1 1.5 SAFETY (NEEDLE) ×1
PACK BASIC VI WITH GOWN DISP (CUSTOM PROCEDURE TRAY) ×1 IMPLANT
PENCIL SMOKE EVACUATOR (MISCELLANEOUS) IMPLANT
SPIKE FLUID TRANSFER (MISCELLANEOUS) ×1 IMPLANT
SPONGE T-LAP 4X18 ~~LOC~~+RFID (SPONGE) ×1 IMPLANT
SUT MNCRL AB 4-0 PS2 18 (SUTURE) ×1 IMPLANT
SUT VIC AB 3-0 SH 27 (SUTURE)
SUT VIC AB 3-0 SH 27XBRD (SUTURE) IMPLANT
SYR BULB IRRIG 60ML STRL (SYRINGE) IMPLANT
SYR CONTROL 10ML LL (SYRINGE) ×1 IMPLANT
TOWEL OR 17X26 10 PK STRL BLUE (TOWEL DISPOSABLE) ×1 IMPLANT

## 2023-05-03 NOTE — Transfer of Care (Signed)
Immediate Anesthesia Transfer of Care Note  Patient: Robert [name]  Procedure(s) Performed: Procedure(s): REMOVAL PORT-A-CATH (N/A)  Patient Location: PACU  Anesthesia Type:MAC  Level of Consciousness: Patient easily awoken, sedated, comfortable, cooperative, following commands, responds to stimulation.   Airway & Oxygen Therapy: Patient spontaneously breathing, ventilating well, oxygen via simple oxygen mask.  Post-op Assessment: Report given to PACU RN, vital signs reviewed and stable, moving all extremities.   Post vital signs: Reviewed and stable.  Complications: No apparent anesthesia complications Last Vitals:  Vitals Value Taken Time  BP 138/91 05/03/23 1213  Temp    Pulse 62 05/03/23 1215  Resp 15 05/03/23 1215  SpO2 94 % 05/03/23 1215  Vitals shown include unfiled device data.  Last Pain:  Vitals:   05/03/23 0806  TempSrc: Oral         Complications: No notable events documented.

## 2023-05-03 NOTE — Anesthesia Preprocedure Evaluation (Signed)
Anesthesia Evaluation  Patient identified by MRN, date of birth, ID band Patient awake    Reviewed: Allergy & Precautions, NPO status , Patient's Chart, lab work & pertinent test results  Airway Mallampati: II  TM Distance: >3 FB Neck ROM: Full    Dental  (+) Dental Advisory Given   Pulmonary former smoker   breath sounds clear to auscultation       Cardiovascular hypertension, Pt. on medications and Pt. on home beta blockers + CAD   Rhythm:Regular Rate:Normal     Neuro/Psych negative neurological ROS     GI/Hepatic Neg liver ROS, hiatal hernia,GERD  ,,Colorectal CA s/p LAR and chemo   Endo/Other  diabetes, Type 2, Oral Hypoglycemic Agents    Renal/GU Renal disease     Musculoskeletal  (+) Arthritis ,    Abdominal   Peds  Hematology  (+) Blood dyscrasia, anemia   Anesthesia Other Findings   Reproductive/Obstetrics                             Anesthesia Physical Anesthesia Plan  ASA: 2  Anesthesia Plan: MAC   Post-op Pain Management: Tylenol PO (pre-op)* and Minimal or no pain anticipated   Induction:   PONV Risk Score and Plan: 1 and Propofol infusion and Ondansetron  Airway Management Planned: Natural Airway and Simple Face Mask  Additional Equipment:   Intra-op Plan:   Post-operative Plan:   Informed Consent: I have reviewed the patients History and Physical, chart, labs and discussed the procedure including the risks, benefits and alternatives for the proposed anesthesia with the patient or authorized representative who has indicated his/her understanding and acceptance.       Plan Discussed with: CRNA  Anesthesia Plan Comments:        Anesthesia Quick Evaluation

## 2023-05-03 NOTE — Discharge Instructions (Addendum)
POST OP INSTRUCTIONS  DIET: As tolerated. Follow a light bland diet the first 24 hours after arrival home, such as soup, liquids, crackers, etc.  Be sure to include lots of fluids daily.  Avoid fast food or heavy meals as your are more likely to get nauseated.  Eat a low fat the next few days after surgery.  Take your usually prescribed home medications unless otherwise directed.  PAIN CONTROL: Pain is best controlled by a usual combination of three different methods TOGETHER: Ice/Heat Over the counter pain medication Prescription pain medication Most patients will experience some swelling and bruising around the surgical site.  Ice packs or heating pads (30-60 minutes up to 6 times a day) will help. Some people prefer to use ice alone, heat alone, alternating between ice & heat.  Experiment to what works for you.  Swelling and bruising can take several weeks to resolve.   It is helpful to take an over-the-counter pain medication regularly for the first few weeks: Ibuprofen (Motrin/Advil) - 200mg  tabs - take 3 tabs (600mg ) every 6 hours as needed for pain Acetaminophen (Tylenol) - you may take 650mg  every 6 hours as needed. You can take this with motrin as they act differently on the body. If you are taking a narcotic pain medication that has acetaminophen in it, do not take over the counter tylenol at the same time.  Iii. NOTE: You may take both of these medications together - most patients  find it most helpful when alternating between the two (i.e. Ibuprofen at 6am,  tylenol at 9am, ibuprofen at 12pm ..Marland Kitchen) A  prescription for pain medication should be given to you upon discharge.  Take your pain medication as prescribed if your pain is not adequatly controlled with the over-the-counter pain reliefs mentioned above.  Avoid getting constipated.  Between the surgery and the pain medications, it is common to experience some constipation.  Increasing fluid intake and taking a fiber supplement (such as  Metamucil, Citrucel, FiberCon, MiraLax, etc) 1-2 times a day regularly will usually help prevent this problem from occurring.  A mild laxative (prune juice, Milk of Magnesia, MiraLax, etc) should be taken according to package directions if there are no bowel movements after 48 hours.    Dressing: Your incision is covered in Dermabond which is like sterile superglue for the skin. This will come off on it's own in a couple weeks. It is waterproof and you may bathe normally starting the day after your surgery in a shower. Avoid baths/pools/lakes/oceans until your wounds have fully healed.  ACTIVITIES as tolerated:   Avoid heavy lifting (>10lbs or 1 gallon of milk) for the next 6 weeks. You may resume regular (light) daily activities beginning the next day--such as daily self-care, walking, climbing stairs--gradually increasing activities as tolerated.  If you can walk 30 minutes without difficulty, it is safe to try more intense activity such as jogging, treadmill, bicycling, low-impact aerobics.  DO NOT PUSH THROUGH PAIN.  Let pain be your guide: If it hurts to do something, don't do it. You may drive when you are no longer taking prescription pain medication, you can comfortably wear a seatbelt, and you can safely maneuver your car and apply brakes.   FOLLOW UP in our office Please call [facility] at [phone] to set up an appointment to see your surgeon in the office for a follow-up appointment approximately 2 weeks after your surgery. Make sure that you call for this appointment the day you arrive home to  insure a convenient appointment time.  9. If you have disability or family leave forms that need to be completed, you may have them completed by your primary care physician's office; for return to work instructions, please ask our office staff and they will be happy to assist you in obtaining this documentation   When to call us [phone]: Poor pain control Reactions / problems with new  medications (rash/itching, etc)  Fever over 101.5 F (38.5 C) Inability to urinate Nausea/vomiting Worsening swelling or bruising Continued bleeding from incision. Increased pain, redness, or drainage from the incision  The clinic staff is available to answer your questions during regular business hours (8:30am-5pm).  Please don't hesitate to call and ask to speak to one of our nurses for clinical concerns.   A surgeon from [facility] is always on call at the hospitals   If you have a medical emergency, go to the nearest emergency room or call 911.  [facility] [address], [address], [place], [place]  [address] MAIN: [phone] FAX: [phone] [email or web address]

## 2023-05-03 NOTE — Op Note (Signed)
05/03/2023  12:05 PM  PATIENT:  Robert [name]  52 y.o. male  Patient Care Team: Bradd Canary, MD as PCP - General (Family Medicine) Christell Constant, MD as PCP - Cardiology (Cardiology) Malachy Mood, MD as Consulting Physician (Oncology) Pollyann Samples, NP as Nurse Practitioner (Oncology) Marily Lente, RN as Oncology Nurse Navigator (Oncology)  PRE-OPERATIVE DIAGNOSIS:  History of rectal cancer  POST-OPERATIVE DIAGNOSIS:  Same  PROCEDURE:  Removal of port-a-catheter (right internal jugular vein)  SURGEON:  Stephanie Coup. Isamu Trammel, MD  ASSISTANT: OR Staff  ANESTHESIA:   local and general  COUNTS:  Sponge, needle and instrument counts were reported correct x2 at the conclusion of the operation.  EBL: 2 mL  DRAINS: None  SPECIMEN: None  COMPLICATIONS: None  FINDINGS: Uneventful level of right internal jugular vein portacatheter.  DISPOSITION: PACU in satisfactory condition  DESCRIPTION: The patient was identified in preop holding and taken to the OR where he was placed on the operating room table. SCDs were placed. MAC anesthesia was induced without difficulty.  Pressure points were then evaluated and padded.  He was then prepped and draped in the usual sterile fashion. A surgical timeout was performed indicating the correct patient, procedure, positioning and need for preoperative antibiotics.   The prior portacatheter is palpable.  The former scar is excised in elliptical manner using the scalpel.  Subcutaneous tissues divided with electrocautery.  The portacatheter pocket is identified.  The sheath is incised.  In doing this, the portacatheter is exposed.  We are then able to grasp this with a penetrating towel clamp and begin to elevate it into the wound.  Prolene sutures were cut and removed.  Pressure was then applied to the neck by our anesthetist and the catheter was carefully withdrawn.  The port and tubing are inspected and noted to be intact without any missing  components.  This was passed off and ultimately discarded.  The tubing tunnel was then closed at the level of the port pocket using 3-0 Vicryl suture.  The wound is irrigated.  Hemostasis is verified.  The wound is then closed in layers using a 3-0 Vicryl deep dermal suture followed by 4-0 Monocryl running subcuticular stitch.  All sponge, needle, and instrument counts were reported correct.  The wound is washed and dried and Dermabond is applied.  He was then awakened from anesthesia and transferred to a stretcher for transport to recovery in satisfactory condition.

## 2023-05-03 NOTE — Anesthesia Postprocedure Evaluation (Signed)
Anesthesia Post Note  Patient: Robert [name]  Procedure(s) Performed: REMOVAL PORT-A-CATH     Patient location during evaluation: PACU Anesthesia Type: MAC Level of consciousness: awake and alert Pain management: pain level controlled Vital Signs Assessment: post-procedure vital signs reviewed and stable Respiratory status: spontaneous breathing, nonlabored ventilation, respiratory function stable and patient connected to nasal cannula oxygen Cardiovascular status: stable and blood pressure returned to baseline Postop Assessment: no apparent nausea or vomiting Anesthetic complications: no  No notable events documented.  Last Vitals:  Vitals:   05/03/23 1245 05/03/23 1256  BP: (!) 141/89 (!) 157/91  Pulse: 60   Resp: 15   Temp: 36.4 C   SpO2: 95% 94%    Last Pain:  Vitals:   05/03/23 1256  TempSrc:   PainSc: 0-No pain                 Kennieth Rad

## 2023-05-03 NOTE — Anesthesia Procedure Notes (Signed)
Procedure Name: MAC Date/Time: 05/03/2023 11:33 AM  Performed by: Ludwig Lean, CRNAPre-anesthesia Checklist: Patient identified, Emergency Drugs available, Suction available and Patient being monitored Patient Re-evaluated:Patient Re-evaluated prior to induction Oxygen Delivery Method: Simple face mask Preoxygenation: Pre-oxygenation with 100% oxygen Placement Confirmation: positive ETCO2 and breath sounds checked- equal and bilateral

## 2023-05-03 NOTE — H&P (Signed)
CC: Here today for surgery  HPI: Robert [name] is an 52 y.o. male with history of HTN, DM, GERD, CAD, whom is seen in the office today as a referral by Dr. Seymour Bars for evaluation of rectal cancer  Colonoscopy with Dr. Russella Dar 05/09/22 -  Five, 5-7 mm polyps removed Proximal rectal tumor, biopsied, submucosal injection tattoo - area proximal to mass tattoo'd x2 Internal hemorrhoids  PATH - Tubular adenomas from poylps; rectal mass - invasive mod diff adenoCA  CT CAP 05/18/22 -  1. Known primary malignancy is not well visualized. Correlate with recent colonoscopy. 2. No evidence of metastatic disease in the chest, abdomen or pelvis. 3. Mildly enlarged bilateral inguinal lymph nodes, likely reactive. 4. 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 Recommend annual imaging followup by CTA or MRA. This recommendation follows 2010 ACCF/AHA/AATS/ACR/ASA/SCA/SCAI/SIR/STS/SVM Guidelines for the Diagnosis and Management of Patients with Thoracic Aortic Disease. Circulation. 2010; 121: Z308-M578. 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 Severe coronary artery calcifications of the LAD and circumflex. 6. Aortic Atherosclerosis (ICD10-I70.0).  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  He notes history of approximately 2 to 3 years of intermittent dark red blood per rectum that coats the stool. Generally not bright or dripping in the toilet bowl. This resolved for some time at which point he had forgotten about it. More recently, had increased again.  Referred to see [place] for evaluation. Of note, his case was presented at our multidisciplinary tumor board 05/30/2022. Consensus recommendation was for EUS for further local staging categorization to help delineate if this is actually a T1 vs T2 lesion.  He was recently started on  iron by Dr. Russella Dar for possible iron deficiency anemia. Hemoglobin is 12.5 on 9/27. This was down from 14.6 back in March.  Underwent EUS with Dr. Meridee Score 06/21/2022: FLEX Impression: - Diverticulosis in the recto-sigmoid colon and in the sigmoid colon. - Malignant tumor in the proximal rectum (11-14 cm) - Non-bleeding non-thrombosed internal hemorrhoids. EUS Impression: - Rectal mass was visualized endosonographically. It invades the muscularis propria but does not extend through it. A tissue diagnosis was obtained prior to this exam. This is consistent with adenocarcinoma. This was staged uT2 uN0. - No malignant-appearing lymph nodes were visualized endosonographically in the perirectal region and in the left iliac region. - The internal anal sphincter was visualized endosonographically and appeared normal.  Case was reviewed with our multidisciplinary board and consensus was for upfront surgery given this endoscopic ultrasound finding.   OR 08/23/22 Robotic assisted low anterior resection with diverting loop ileostomy Diagnostic flexible sigmoidoscopy - necessary to confirm tumor location Intraoperative assessment of perfusion using ICG fluorescence imaging Bilateral transversus abdominus plane (TAP) blocks  FINDINGS: Tattoo in proximal rectum. Flexible sigmoidoscope demonstrates the fungiating mass to be located distal to this along the right posterior wall. With the endoscope in place, we are able to orient our distal point of transection of ~2 cm with sufficient distal margin. Narrow male pelvis increasing the complexity of the dissection. No obvious metastatic disease on visceral or parietal peritoneum or liver. A well perfused, tension free, hemostatic, air tight 31 mm EEA colorectal anastomosis fashioned 4 cm from the anal verge by a straightened flexible sigmoidoscopy (approximately 2 cm from the top of the sphincter complex).   Admitted postoperatively and recovered well. He was  discharged home 08/26/2022.  PATH FINAL MICROSCOPIC DIAGNOSIS:   A. COLON, RECTOSIGMOID, RESECTION:  - Invasive moderately differentiated adenocarcinoma, 2.5 cm, involving  rectum  - Carcinoma invades focally into the perirectal adipose tissue  - Resection margins are negative for carcinoma  - Negative for lymphovascular invasion  - Fourteen lymph nodes, negative for carcinoma (0/14)  - See oncology table   B. DISTAL MARGIN DONUT:  - Rectal donut, negative for carcinoma   IHC Expression - preserved  Case was presented at [facility]. Following with Dr. Mosetta Putt, on adjuvant chemo - was on CapeOx but switched to FOLFOX for side effect/rxn with his PPI which had landed him in the hospital for a few days. He has been much better. He had noted significant dehydration type symptoms preceding his admission to the hospital which was all from a timing standpoint coinciding with initiation of chemotherapy. He has been carefully monitoring his ostomy output at home. He has found a healthy medium with regards to his Imodium dosing-once daily currently him with that he is able to keep his output thickened and toothpaste consistency and under a liter a day. Good appetite. No nausea or vomiting. No complaints.  Planning adjuvant chemotherapy-3 months.  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  FINDINGS: Flexible sigmoidoscopy demonstrated healthy appearing rectum and left colon. Anastomosis is widely patent without any evident sinuses or disruptions. There are no evident masses within the rectum or visualized portions of the colon. Visualization is somewhat limited for smaller polyps due to much of the colon being coated with a thin layer of inspissated stool, difficult to wash free. Loop ileostomy taken down uneventfully.   PATH A. ILEOSTOMY, TAKEDOWN:  Ileocutaneous anastomosis with reactive changes  and dense serosal adhesions  Negative for carcinoma   Returns for follow-up. He has been doing well. He does take approximately 1 tablet of Imodium a day with this, was able to abate any sort of significant watery stool or explosive episodes. He is tolerating regular diet. He does note that certain types of roughage like green beans will cause some increased bloating. No nausea or vomiting. He denies any abdominal pain.  PMH: HTN, DM (on metformin), CAD, GERD, Iron deficiency anemia,  PSH: Denies  FHx: Father had a brain tumor behind his sinuses. Denies any known family history of colorectal, breast, endometrial or ovarian cancer  Social Hx:  Reformed smoker, quit 1997. Social EtOH use. Denies illicit drug use. Works at [employer]. He returns today alone.   He denies any changes in health or health history since we met in the office. No new medications/allergies. He states he is ready for surgery today - removal of port-a-catheter  Past Medical History:  Diagnosis Date   Anemia    Arthralgia 01/11/2013   Diffuse, b/l Hips R>L Knees, ankles, low back   Arthritis    Back pain 12/08/2015   Cancer (HCC)    Coronary artery disease    Diabetes mellitus type 2 in obese 11/09/2012   Dyslipidemia 11/09/2012   Esophageal reflux 12/08/2015   Feeling grief 06/09/2020   History of migraine headaches    Hyperglycemia 11/09/2012   Hypertension    Neuropathy    hands and feet    Past Surgical History:  Procedure Laterality Date   DIVERTING ILEOSTOMY N/A 08/23/2022   Procedure: DIVERTING LOOP ILEOSTOMY;  Surgeon: Andria Meuse, MD;  Location: [facility];  Service: General;  Laterality:  N/A;   EUS N/A 06/21/2022   Procedure: LOWER ENDOSCOPIC ULTRASOUND (EUS);  Surgeon: Lemar Lofty., MD;  Location: [facility];  Service: Gastroenterology;  Laterality: N/A;   FLEXIBLE SIGMOIDOSCOPY N/A 06/21/2022   Procedure: FLEXIBLE SIGMOIDOSCOPY;  Surgeon: Meridee Score Netty Starring., MD;  Location: [facility];  Service: Gastroenterology;  Laterality: N/A;   FLEXIBLE SIGMOIDOSCOPY N/A 08/23/2022   Procedure: FLEXIBLE SIGMOIDOSCOPY;  Surgeon: Andria Meuse, MD;  Location: [facility];  Service: General;  Laterality: N/A;   FLEXIBLE SIGMOIDOSCOPY N/A 03/20/2023   Procedure: DIAGNOSTIC FLEXIBLE SIGMOIDOSCOPY;  Surgeon: Andria Meuse, MD;  Location: [facility];  Service: General;  Laterality: N/A;   ILEOSTOMY CLOSURE N/A 03/20/2023   Procedure: TAKEDOWN OF DIVERTING LOOP ILEOSTOMY;  Surgeon: Andria Meuse, MD;  Location: [facility];  Service: General;  Laterality: N/A;   PORTACATH PLACEMENT N/A 09/21/2022   Procedure: INSERTION PORT-A-CATH WITH ULTRASOUND GUIDANCE;  Surgeon: Andria Meuse, MD;  Location: [facility];  Service: General;  Laterality: N/A;   XI ROBOTIC ASSISTED LOWER ANTERIOR RESECTION N/A 08/23/2022   Procedure: XI ROBOTIC ASSISTED LOWER ANTERIOR RESECTION with Introperative assessment of profusion and TAP Block;  Surgeon: Andria Meuse, MD;  Location: [facility];  Service: General;  Laterality: N/A;    Family History  Problem Relation Age of Onset   Arthritis Mother    Hypertension Mother    Diabetes Mother    Cancer Mother    Arthritis Father    Hypertension Father    Diabetes Father    Sleep apnea Father    Obesity Father    Barrett's esophagus Father    Hyperlipidemia Brother    Hypertension Brother    Diabetes Brother    Arthritis Maternal Grandmother    Diabetes Maternal Grandmother    Stroke Maternal Grandmother    COPD Maternal Grandfather    Arthritis Paternal Grandmother    Diabetes Paternal Grandmother    Cancer Paternal Grandmother        lung, smoker   Diabetes Paternal Grandfather    Heart disease Paternal Grandfather 8       MI   Kidney disease Neg Hx     Social:  reports that he quit smoking about 27 years ago. His smoking use included cigarettes. He started smoking about 37 years ago. He has a 15 pack-year smoking history. He has never  used smokeless tobacco. He reports current alcohol use of about 5.0 standard drinks of alcohol per week. He reports that he does not currently use drugs after having used the following drugs: Marijuana.  Allergies:  Allergies  Allergen Reactions   Advil [Ibuprofen] Shortness Of Breath   Aleve [Naproxen] Shortness Of Breath   Mucinex [Guaifenesin Er] Shortness Of Breath, Itching, Swelling, Dermatitis, Rash and Other (See Comments)    Swelling of hands and feet   Nsaids Shortness Of Breath, Itching, Swelling, Rash and Other (See Comments)    Aleve and Advil    Levaquin [Levofloxacin]     Aortic aneurysm; not recommend in this condition unless no other agents would cover   Latex Rash and Other (See Comments)    "Cartilage will harden"    Medications: I have reviewed the patient's current medications.  Results for orders placed or performed during the hospital encounter of 05/03/23 (from the past 48 hour(s))  Glucose, capillary     Status: Abnormal   Collection Time: 05/03/23  8:10 AM  Result Value Ref Range   Glucose-Capillary 126 (H) 70 - 99 mg/dL  Comment: Glucose reference range applies only to samples taken after fasting for at least 8 hours.  CBC with Differential/Platelet     Status: Abnormal   Collection Time: 05/03/23  8:15 AM  Result Value Ref Range   WBC 5.2 4.0 - 10.5 K/uL   RBC 3.60 (L) 4.22 - 5.81 MIL/uL   Hemoglobin 8.6 (L) 13.0 - 17.0 g/dL   HCT 40.9 (L) 81.1 - 91.4 %   MCV 81.9 80.0 - 100.0 fL   MCH 23.9 (L) 26.0 - 34.0 pg   MCHC 29.2 (L) 30.0 - 36.0 g/dL   RDW 78.2 95.6 - 21.3 %   Platelets 203 150 - 400 K/uL   nRBC 0.0 0.0 - 0.2 %   Neutrophils Relative % 55 %   Neutro Abs 2.9 1.7 - 7.7 K/uL   Lymphocytes Relative 30 %   Lymphs Abs 1.6 0.7 - 4.0 K/uL   Monocytes Relative 9 %   Monocytes Absolute 0.5 0.1 - 1.0 K/uL   Eosinophils Relative 5 %   Eosinophils Absolute 0.3 0.0 - 0.5 K/uL   Basophils Relative 1 %   Basophils Absolute 0.0 0.0 - 0.1 K/uL    Immature Granulocytes 0 %   Abs Immature Granulocytes 0.02 0.00 - 0.07 K/uL    Comment: Performed at [facility], [address], [place], [place] [address]  Glucose, capillary     Status: Abnormal   Collection Time: 05/03/23 10:20 AM  Result Value Ref Range   Glucose-Capillary 115 (H) 70 - 99 mg/dL    Comment: Glucose reference range applies only to samples taken after fasting for at least 8 hours.    No results found.   PE Blood pressure (!) 172/99, pulse 60, temperature 98.5 F (36.9 C), temperature source Oral, resp. rate 16, height 6\' 3"  (1.905 m), weight 116.8 kg, SpO2 94%. Constitutional: NAD; conversant Eyes: Moist conjunctiva; no lid lag; anicteric Lungs: Normal respiratory effort CV: RRR Psychiatric: Appropriate affect  Results for orders placed or performed during the hospital encounter of 05/03/23 (from the past 48 hour(s))  Glucose, capillary     Status: Abnormal   Collection Time: 05/03/23  8:10 AM  Result Value Ref Range   Glucose-Capillary 126 (H) 70 - 99 mg/dL    Comment: Glucose reference range applies only to samples taken after fasting for at least 8 hours.  CBC with Differential/Platelet     Status: Abnormal   Collection Time: 05/03/23  8:15 AM  Result Value Ref Range   WBC 5.2 4.0 - 10.5 K/uL   RBC 3.60 (L) 4.22 - 5.81 MIL/uL   Hemoglobin 8.6 (L) 13.0 - 17.0 g/dL   HCT 84.6 (L) 96.2 - 95.2 %   MCV 81.9 80.0 - 100.0 fL   MCH 23.9 (L) 26.0 - 34.0 pg   MCHC 29.2 (L) 30.0 - 36.0 g/dL   RDW 84.1 32.4 - 40.1 %   Platelets 203 150 - 400 K/uL   nRBC 0.0 0.0 - 0.2 %   Neutrophils Relative % 55 %   Neutro Abs 2.9 1.7 - 7.7 K/uL   Lymphocytes Relative 30 %   Lymphs Abs 1.6 0.7 - 4.0 K/uL   Monocytes Relative 9 %   Monocytes Absolute 0.5 0.1 - 1.0 K/uL   Eosinophils Relative 5 %   Eosinophils Absolute 0.3 0.0 - 0.5 K/uL   Basophils Relative 1 %   Basophils Absolute 0.0 0.0 - 0.1 K/uL   Immature Granulocytes 0 %   Abs Immature  Granulocytes 0.02 0.00 - 0.07 K/uL    Comment: Performed at [facility], [address] [place], [place] [address]  Glucose, capillary     Status: Abnormal   Collection Time: 05/03/23 10:20 AM  Result Value Ref Range   Glucose-Capillary 115 (H) 70 - 99 mg/dL    Comment: Glucose reference range applies only to samples taken after fasting for at least 8 hours.    No results found.  A/P: Robert [name] is an 52 y.o. male with hx of HTN, DM, GERD here for evaluation of newly diagnosed cmriT1/T2N0 rectal cancer - right lateral wall, mid rectum CT CAP 05/17/22 -   Cmri 05/29/22 - T1/T2N0, 4.6 cm from IAS, 10.1 cm from   ceusT2N0 06/21/22 (Dr. Meridee Score)  MDT --> recommendation made for upfront surgery, LAR. OR 08/23/22 robotic LAR/DLI pT3N0 moderately differentiated adenocarcinoma; margins negative  -Postoperatively, case reviewed at MDT. Consensus recommendation for discussions with oncology for adjuvant treatment. He has an appointment scheduled 09/13/2022. -We discussed things watch out for moving forward.  Now on FOLFOX - last dose next week  GGE Clear 11/19/22  -Cardiac clearance from Dr. Izora Ribas   OR 03/20/23 - loop ileostomy reversal, flex sig   We have been asked remove his portacatheter.  He presents today for this purpose.  We discussed the procedure of portacatheter removal.  We discussed the material risks (including, but not limited to, pain, bleeding, infection, scarring, need for additional procedures, heart attack, stroke, and even rarely death) benefits, and alternatives.  Questions were answered.  He wishes to proceed.  Marin Olp, MD [facility]

## 2023-05-04 ENCOUNTER — Encounter (HOSPITAL_COMMUNITY): Payer: Self-pay | Admitting: Surgery

## 2023-05-06 ENCOUNTER — Other Ambulatory Visit: Payer: Self-pay | Admitting: Nurse Practitioner

## 2023-05-06 DIAGNOSIS — M792 Neuralgia and neuritis, unspecified: Secondary | ICD-10-CM

## 2023-05-09 ENCOUNTER — Encounter: Payer: Self-pay | Admitting: Gastroenterology

## 2023-05-21 ENCOUNTER — Other Ambulatory Visit: Payer: Self-pay | Admitting: Surgery

## 2023-05-21 DIAGNOSIS — I7121 Aneurysm of the ascending aorta, without rupture: Secondary | ICD-10-CM

## 2023-05-22 ENCOUNTER — Ambulatory Visit: Payer: BLUE CROSS/BLUE SHIELD | Admitting: Gastroenterology

## 2023-05-22 ENCOUNTER — Encounter: Payer: Self-pay | Admitting: Gastroenterology

## 2023-05-22 VITALS — BP 139/71 | HR 62 | Resp 14

## 2023-05-22 DIAGNOSIS — Z85048 Personal history of other malignant neoplasm of rectum, rectosigmoid junction, and anus: Secondary | ICD-10-CM | POA: Diagnosis not present

## 2023-05-22 DIAGNOSIS — K2270 Barrett's esophagus without dysplasia: Secondary | ICD-10-CM

## 2023-05-22 DIAGNOSIS — Z08 Encounter for follow-up examination after completed treatment for malignant neoplasm: Secondary | ICD-10-CM

## 2023-05-22 DIAGNOSIS — K219 Gastro-esophageal reflux disease without esophagitis: Secondary | ICD-10-CM | POA: Diagnosis not present

## 2023-05-22 DIAGNOSIS — K319 Disease of stomach and duodenum, unspecified: Secondary | ICD-10-CM | POA: Diagnosis not present

## 2023-05-22 MED ORDER — SODIUM CHLORIDE 0.9 % IV SOLN: 500.0000 mL | INTRAVENOUS | Status: DC

## 2023-05-22 MED ORDER — SODIUM CHLORIDE 0.9% FLUSH
10.0000 mL | Freq: Two times a day (BID) | INTRAVENOUS | Status: DC
Start: 2023-05-22 — End: 2023-05-22

## 2023-05-22 NOTE — Patient Instructions (Addendum)
Handout provided on hiatal hernia, gastritis and hemorrhoids.  Resume previous diet.  Follow antireflux measures (see hiatal hernia handout).  Continue present medications.  Await pathology results.  Repeat colonoscopy in 3 years for surveillance.   YOU HAD AN ENDOSCOPIC PROCEDURE TODAY AT THE [facility]:   Refer to the procedure report that was given to you for any specific questions about what was found during the examination.  If the procedure report does not answer your questions, please call your gastroenterologist to clarify.  If you requested that your care partner not be given the details of your procedure findings, then the procedure report has been included in a sealed envelope for you to review at your convenience later.  YOU SHOULD EXPECT: Some feelings of bloating in the abdomen. Passage of more gas than usual.  Walking can help get rid of the air that was put into your GI tract during the procedure and reduce the bloating. If you had a lower endoscopy (such as a colonoscopy or flexible sigmoidoscopy) you may notice spotting of blood in your stool or on the toilet paper. If you underwent a bowel prep for your procedure, you may not have a normal bowel movement for a few days.  Please Note:  You might notice some irritation and congestion in your nose or some drainage.  This is from the oxygen used during your procedure.  There is no need for concern and it should clear up in a day or so.  SYMPTOMS TO REPORT IMMEDIATELY:  Following lower endoscopy (colonoscopy or flexible sigmoidoscopy):  Excessive amounts of blood in the stool  Significant tenderness or worsening of abdominal pains  Swelling of the abdomen that is new, acute  Fever of 100F or higher  Following upper endoscopy (EGD)  Vomiting of blood or coffee ground material  New chest pain or pain under the shoulder blades  Painful or persistently difficult swallowing  New shortness of breath  Fever of 100F or  higher  Black, tarry-looking stools  For urgent or emergent issues, a gastroenterologist can be reached at any hour by calling [phone]. Do not use MyChart messaging for urgent concerns.    DIET:  We do recommend a small meal at first, but then you may proceed to your regular diet.  Drink plenty of fluids but you should avoid alcoholic beverages for 24 hours.  ACTIVITY:  You should plan to take it easy for the rest of today and you should NOT DRIVE or use heavy machinery until tomorrow (because of the sedation medicines used during the test).    FOLLOW UP: Our staff will call the number listed on your records the next business day following your procedure.  We will call around 7:15- 8:00 am to check on you and address any questions or concerns that you may have regarding the information given to you following your procedure. If we do not reach you, we will leave a message.     If any biopsies were taken you will be contacted by phone or by letter within the next 1-3 weeks.  Please call us at [phone] if you have not heard about the biopsies in 3 weeks.    SIGNATURES/CONFIDENTIALITY: You and/or your care partner have signed paperwork which will be entered into your electronic medical record.  These signatures attest to the fact that that the information above on your After Visit Summary has been reviewed and is understood.  Full responsibility of the confidentiality of this discharge  information lies with you and/or your care-partner.

## 2023-05-22 NOTE — Progress Notes (Signed)
History & Physical  Primary Care Physician:  Bradd Canary, MD Primary Gastroenterologist: Claudette Head, MD  Impression / Plan: 52 year old male with a history of rectal cancer, T3 N0 M0, diagnosed in 2023 & GERD.  He is S/P LAR and diverting ileostomy 08/2022, S/P ileostomy takedown 03/2023.  Colonoscopy in 2023 also showed 4TAs.  For EGD and colonoscopy.   CHIEF COMPLAINT: GERD, Personal history of rectal cancer  HPI: Robert [name] is a 52 y.o. male with a history of rectal cancer, T3 N0 M0, diagnosed in 2023 & GERD.  He is S/P LAR and diverting ileostomy 08/2022, S/P ileostomy takedown 03/2023.  Colonoscopy in 2023 also showed 4TAs.  For EGD and colonoscopy.   Past Medical History:  Diagnosis Date   Anemia    Arthralgia 01/11/2013   Diffuse, b/l Hips R>L Knees, ankles, low back   Arthritis    Back pain 12/08/2015   Cancer (HCC)    Coronary artery disease    Diabetes mellitus type 2 in obese 11/09/2012   Dyslipidemia 11/09/2012   Esophageal reflux 12/08/2015   Feeling grief 06/09/2020   History of migraine headaches    Hyperglycemia 11/09/2012   Hypertension    Neuropathy    hands and feet    Past Surgical History:  Procedure Laterality Date   DIVERTING ILEOSTOMY N/A 08/23/2022   Procedure: DIVERTING LOOP ILEOSTOMY;  Surgeon: Andria Meuse, MD;  Location: [facility];  Service: General;  Laterality: N/A;   EUS N/A 06/21/2022   Procedure: LOWER ENDOSCOPIC ULTRASOUND (EUS);  Surgeon: Lemar Lofty., MD;  Location: [facility];  Service: Gastroenterology;  Laterality: N/A;   FLEXIBLE SIGMOIDOSCOPY N/A 06/21/2022   Procedure: FLEXIBLE SIGMOIDOSCOPY;  Surgeon: Meridee Score Netty Starring., MD;  Location: [facility];  Service: Gastroenterology;  Laterality: N/A;   FLEXIBLE SIGMOIDOSCOPY N/A 08/23/2022   Procedure: FLEXIBLE SIGMOIDOSCOPY;  Surgeon: Andria Meuse, MD;  Location: [facility];  Service: General;  Laterality: N/A;   FLEXIBLE SIGMOIDOSCOPY N/A 03/20/2023    Procedure: DIAGNOSTIC FLEXIBLE SIGMOIDOSCOPY;  Surgeon: Andria Meuse, MD;  Location: [facility];  Service: General;  Laterality: N/A;   ILEOSTOMY CLOSURE N/A 03/20/2023   Procedure: TAKEDOWN OF DIVERTING LOOP ILEOSTOMY;  Surgeon: Andria Meuse, MD;  Location: [facility];  Service: General;  Laterality: N/A;   PORT-A-CATH REMOVAL N/A 05/03/2023   Procedure: REMOVAL PORT-A-CATH;  Surgeon: Andria Meuse, MD;  Location: [facility];  Service: General;  Laterality: N/A;   PORTACATH PLACEMENT N/A 09/21/2022   Procedure: INSERTION PORT-A-CATH WITH ULTRASOUND GUIDANCE;  Surgeon: Andria Meuse, MD;  Location: [facility];  Service: General;  Laterality: N/A;   XI ROBOTIC ASSISTED LOWER ANTERIOR RESECTION N/A 08/23/2022   Procedure: XI ROBOTIC ASSISTED LOWER ANTERIOR RESECTION with Introperative assessment of profusion and TAP Block;  Surgeon: Andria Meuse, MD;  Location: [facility];  Service: General;  Laterality: N/A;    Prior to Admission medications   Medication Sig Start Date End Date Taking? Authorizing Provider  carvedilol (COREG) 25 MG tablet Take 1 tablet (25 mg total) by mouth 2 (two) times daily. 06/19/22  Yes Chandrasekhar, Mahesh A, MD  famotidine (PEPCID) 40 MG tablet Take 1 tablet (40 mg total) by mouth at bedtime. Patient taking differently: Take 40 mg by mouth at bedtime as needed for heartburn or indigestion. 03/05/23  Yes Meryl Dare, MD  gabapentin (NEURONTIN) 100 MG capsule TAKE 1-2 CAPSULES (100-200 MG TOTAL) BY MOUTH 3 (THREE) TIMES DAILY. 05/06/23  Yes Pollyann Samples, NP  glucose blood (COOL BLOOD GLUCOSE TEST STRIPS) test strip Check blood sugar once daily 09/18/22  Yes Bradd Canary, MD  loperamide (IMODIUM) 2 MG capsule Take 2 mg by mouth as needed for diarrhea or loose stools.   Yes [provider]  metFORMIN (GLUCOPHAGE) 500 MG tablet TAKE 1 TABLET BY MOUTH TWICE A DAY Patient taking differently: Take 500 mg by mouth daily. 03/18/23  Yes Bradd Canary, MD   pantoprazole (PROTONIX) 40 MG tablet Take 1 tablet (40 mg total) by mouth daily. 11/22/22  Yes Bradd Canary, MD  sitaGLIPtin (JANUVIA) 100 MG tablet Take 1 tablet (100 mg total) by mouth daily. 02/21/23  Yes Bradd Canary, MD  amLODipine (NORVASC) 5 MG tablet TAKE 1 TABLET (5 MG TOTAL) BY MOUTH DAILY. Patient taking differently: Take 5 mg by mouth daily as needed (blood pressure above 140). 02/18/23   Bradd Canary, MD  tiZANidine (ZANAFLEX) 2 MG tablet Take 1-2 tablets (2-4 mg total) by mouth 3 (three) times daily as needed for muscle spasms. 12/12/22   Bradd Canary, MD    Current Outpatient Medications  Medication Sig Dispense Refill   carvedilol (COREG) 25 MG tablet Take 1 tablet (25 mg total) by mouth 2 (two) times daily. 180 tablet 3   famotidine (PEPCID) 40 MG tablet Take 1 tablet (40 mg total) by mouth at bedtime. (Patient taking differently: Take 40 mg by mouth at bedtime as needed for heartburn or indigestion.) 30 tablet 3   gabapentin (NEURONTIN) 100 MG capsule TAKE 1-2 CAPSULES (100-200 MG TOTAL) BY MOUTH 3 (THREE) TIMES DAILY. 60 capsule 0   glucose blood (COOL BLOOD GLUCOSE TEST STRIPS) test strip Check blood sugar once daily 100 each 12   loperamide (IMODIUM) 2 MG capsule Take 2 mg by mouth as needed for diarrhea or loose stools.     metFORMIN (GLUCOPHAGE) 500 MG tablet TAKE 1 TABLET BY MOUTH TWICE A DAY (Patient taking differently: Take 500 mg by mouth daily.) 180 tablet 1   pantoprazole (PROTONIX) 40 MG tablet Take 1 tablet (40 mg total) by mouth daily. 90 tablet 1   sitaGLIPtin (JANUVIA) 100 MG tablet Take 1 tablet (100 mg total) by mouth daily. 90 tablet 1   amLODipine (NORVASC) 5 MG tablet TAKE 1 TABLET (5 MG TOTAL) BY MOUTH DAILY. (Patient taking differently: Take 5 mg by mouth daily as needed (blood pressure above 140).) 90 tablet 1   tiZANidine (ZANAFLEX) 2 MG tablet Take 1-2 tablets (2-4 mg total) by mouth 3 (three) times daily as needed for muscle spasms. 60 tablet 2    Current Facility-Administered Medications  Medication Dose Route Frequency Provider Last Rate Last Admin   sodium chloride flush (NS) 0.9 % injection 10 mL  10 mL Intravenous Q12H Meryl Dare, MD        Allergies as of 05/22/2023 - Review Complete 05/22/2023  Allergen Reaction Noted   Advil [ibuprofen] Shortness Of Breath 11/30/2022   Aleve [naproxen] Shortness Of Breath 11/30/2022   Mucinex [guaifenesin er] Shortness Of Breath, Itching, Swelling, Dermatitis, Rash, and Other (See Comments) 07/13/2014   Nsaids Shortness Of Breath, Itching, Swelling, Rash, and Other (See Comments) 01/20/2014   Levaquin [levofloxacin]  12/24/2022   Latex Rash and Other (See Comments) 04/25/2012    Family History  Problem Relation Age of Onset   Arthritis Mother    Hypertension Mother    Diabetes Mother    Cancer Mother    Arthritis Father    Hypertension  Father    Diabetes Father    Sleep apnea Father    Obesity Father    Barrett's esophagus Father    Hyperlipidemia Brother    Hypertension Brother    Diabetes Brother    Arthritis Maternal Grandmother    Diabetes Maternal Grandmother    Stroke Maternal Grandmother    COPD Maternal Grandfather    Arthritis Paternal Grandmother    Diabetes Paternal Grandmother    Cancer Paternal Grandmother        lung, smoker   Diabetes Paternal Grandfather    Heart disease Paternal Grandfather 68       MI   Kidney disease Neg Hx     Social History   Socioeconomic History   Marital status: Married    Spouse name: Not on file   Number of children: 4   Years of education: Not on file   Highest education level: Bachelor's degree (e.g., BA, AB, BS)  Occupational History   Occupation: Community education officer: [employer]  Tobacco Use   Smoking status: Former    Current packs/day: 0.00    Average packs/day: 1.5 packs/day for 10.0 years (15.0 ttl pk-yrs)    Types: Cigarettes    Start date: 08/13/1985    Quit date: 08/14/1995    Years since  quitting: 27.7   Smokeless tobacco: Never  Vaping Use   Vaping status: Never Used  Substance and Sexual Activity   Alcohol use: Yes    Alcohol/week: 5.0 standard drinks of alcohol    Types: 5 Cans of beer per week   Drug use: Not Currently    Types: Marijuana    Comment: last use 02/2023   Sexual activity: Yes    Comment: lives with wife and daughter, no dietary restrictions  Other Topics Concern   Not on file  Social History Narrative   Regular exercise:  Walks 1-2 x weekly   Caffeine use:  3-4 daily   4 children- oldest first marriage 42 (son), 24 son, 10 son, 41 yr old girl   Married   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[employer] Strain: Low Risk  (12/23/2022)   Overall Financial Resource Strain (CARDIA)    Difficulty of Paying Living Expenses: Not very hard  Food Insecurity: No Food Insecurity (03/20/2023)   Hunger Vital Sign    Worried About Running Out of Food in the Last Year: Never true    Ran Out of Food in the Last Year: Never true  Transportation Needs: No Transportation Needs (03/20/2023)   PRAPARE - Administrator, Civil Service (Medical): No    Lack of Transportation (Non-Medical): No  Physical Activity: Unknown (12/23/2022)   Exercise Vital Sign    Days of Exercise per Week: 5 days    Minutes of Exercise per Session: Patient declined  Stress: No Stress Concern Present (12/23/2022)   [employer] of Occupational Health - Occupational Stress Questionnaire    Feeling of Stress : Only a little  Social Connections: Moderately Integrated (12/23/2022)   Social Connection and Isolation Panel [NHANES]    Frequency of Communication with Friends and Family: Three times a week    Frequency of Social Gatherings with Friends and Family: Patient declined    Attends Religious Services: More than 4 times per year    Active Member of Clubs or Organizations: No  Attends Banker  Meetings: Not on file    Marital Status: Married  Intimate Partner Violence: Not At Risk (03/20/2023)   Humiliation, Afraid, Rape, and Kick questionnaire    Fear of Current or Ex-Partner: No    Emotionally Abused: No    Physically Abused: No    Sexually Abused: No    Review of Systems:  All systems reviewed were negative except where noted in HPI.   Physical Exam:  General:  Alert, well-developed, in NAD Head:  Normocephalic and atraumatic. Eyes:  Sclera clear, no icterus.   Conjunctiva pink. Ears:  Normal auditory acuity. Mouth:  No deformity or lesions.  Neck:  Supple; no masses. Lungs:  Clear throughout to auscultation.   No wheezes, crackles, or rhonchi.  Heart:  Regular rate and rhythm; no murmurs. Abdomen:  Soft, nondistended, nontender. No masses, hepatomegaly. No palpable masses.  Normal bowel sounds.    Rectal:  Deferred   Msk:  Symmetrical without gross deformities. Extremities:  Without edema. Neurologic:  Alert and  oriented x 4; grossly normal neurologically. Skin:  Intact without significant lesions or rashes. Psych:  Alert and cooperative. Normal mood and affect.   Robert [name]  05/22/2023, 7:55 AM See Loretha Stapler, [facility], to contact our on call provider

## 2023-05-22 NOTE — Op Note (Signed)
[facility] Patient Name: Robert [name] Procedure Date: 05/22/2023 7:58 AM MRN: [id number] Endoscopist: Meryl Dare , MD, [phone] Age: 52 Referring MD:  Date of Birth: [birth year] Gender: Male Account #: [id number] Procedure:                Upper GI endoscopy Indications:              Gastroesophageal reflux disease Medicines:                Monitored Anesthesia Care Procedure:                Pre-Anesthesia Assessment:                           - Prior to the procedure, a History and Physical                            was performed, and patient medications and                            allergies were reviewed. The patient's tolerance of                            previous anesthesia was also reviewed. The risks                            and benefits of the procedure and the sedation                            options and risks were discussed with the patient.                            All questions were answered, and informed consent                            was obtained. Prior Anticoagulants: The patient has                            taken no anticoagulant or antiplatelet agents. ASA                            Grade Assessment: III - A patient with severe                            systemic disease. After reviewing the risks and                            benefits, the patient was deemed in satisfactory                            condition to undergo the procedure.                           After obtaining informed consent, the endoscope was  passed under direct vision. Throughout the                            procedure, the patient's blood pressure, pulse, and                            oxygen saturations were monitored continuously. The                            GIF [id number] #[id number] was introduced through the                            mouth, and advanced to the second part of duodenum.                            The upper GI  endoscopy was accomplished without                            difficulty. The patient tolerated the procedure                            well. Scope In: Scope Out: Findings:                 The Z-line was variable and was found 38 cm - 41 cm                            from the incisors. Biopsies were taken with a cold                            forceps for histology.                           The exam of the esophagus was otherwise normal.                           Patchy mildly erythematous mucosa without bleeding                            was found in the gastric fundus and in the gastric                            body. Biopsies were taken with a cold forceps for                            histology.                           A small hiatal hernia was present.                           The gastroesophageal flap valve was visualized                            endoscopically and classified as  Hill Grade II                            (fold present, opens with respiration).                           The exam of the stomach was otherwise normal.                           The duodenal bulb and second portion of the                            duodenum were normal. Complications:            No immediate complications. Estimated Blood Loss:     Estimated blood loss was minimal. Impression:               - Z-line variable, 38 cm - 41 cm from the incisors.                            Biopsied.                           - Erythematous mucosa in the gastric fundus and                            gastric body. Biopsied.                           - Small hiatal hernia.                           - Gastroesophageal flap valve classified as Hill                            Grade II (fold present, opens with respiration).                           - Normal duodenal bulb and second portion of the                            duodenum. Recommendation:           - Patient has a contact number available for                             emergencies. The signs and symptoms of potential                            delayed complications were discussed with the                            patient. Return to normal activities tomorrow.                            Written discharge instructions were provided to the  patient.                           - Resume previous diet.                           - Follow antireflux measures.                           - Continue present medications.                           - Await pathology results. Meryl Dare, MD 05/22/2023 8:48:12 AM This report has been signed electronically.

## 2023-05-22 NOTE — Op Note (Addendum)
[facility] Patient Name: Robert [name] Procedure Date: 05/22/2023 7:58 AM MRN: [id number] Endoscopist: Meryl Dare , MD, [phone] Age: 52 Referring MD:  Date of Birth: 01-Jun-1971 Gender: Male Account #: [id number] Procedure:                Colonoscopy Indications:              High risk colon cancer surveillance: Personal                            history of rectal cancer Medicines:                Monitored Anesthesia Care Procedure:                Pre-Anesthesia Assessment:                           - Prior to the procedure, a History and Physical                            was performed, and patient medications and                            allergies were reviewed. The patient's tolerance of                            previous anesthesia was also reviewed. The risks                            and benefits of the procedure and the sedation                            options and risks were discussed with the patient.                            All questions were answered, and informed consent                            was obtained. Prior Anticoagulants: The patient has                            taken no anticoagulant or antiplatelet agents. ASA                            Grade Assessment: III - A patient with severe                            systemic disease. After reviewing the risks and                            benefits, the patient was deemed in satisfactory                            condition to undergo the procedure.  After obtaining informed consent, the colonoscope                            was passed under direct vision. Throughout the                            procedure, the patient's blood pressure, pulse, and                            oxygen saturations were monitored continuously. The                            CF [id number] #[id number] was introduced through the anus                            and advanced to the the cecum,  identified by                            appendiceal orifice and ileocecal valve. The                            ileocecal valve, appendiceal orifice, and rectum                            were photographed. The quality of the bowel                            preparation was good. The colonoscopy was performed                            without difficulty. The patient tolerated the                            procedure well. Unable to safely retroflex due to                            anastomosis. Scope In: 8:05:34 AM Scope Out: 8:19:12 AM Scope Withdrawal Time: 0 hours 11 minutes 2 seconds  Total Procedure Duration: 0 hours 13 minutes 38 seconds  Findings:                 The perianal and digital rectal examinations were                            normal.                           There was evidence of a prior end-to-end                            colo-rectal anastomosis in the proximal rectum.                            This was patent and was characterized by healthy  appearing mucosa. The anastomosis was traversed.                           Internal hemorrhoids were found during                            retroflexion. The hemorrhoids were small and Grade                            I (internal hemorrhoids that do not prolapse).                           The exam was otherwise without abnormality on                            direct views. Complications:            No immediate complications. Estimated blood loss:                            None. Estimated Blood Loss:     Estimated blood loss: none. Impression:               - Patent end-to-end colo-rectal anastomosis,                            characterized by healthy appearing mucosa.                           - Internal hemorrhoids.                           - The examination was otherwise normal on direct                            and retroflexion views.                           - No specimens  collected. Recommendation:           - Repeat colonoscopy in 3 years for surveillance.                           - Patient has a contact number available for                            emergencies. The signs and symptoms of potential                            delayed complications were discussed with the                            patient. Return to normal activities tomorrow.                            Written discharge instructions were provided to the  patient.                           - Resume previous diet.                           - Continue present medications. Meryl Dare, MD 05/22/2023 8:35:57 AM This report has been signed electronically.

## 2023-05-22 NOTE — Progress Notes (Signed)
Pt's states no medical or surgical changes since previsit or office visit. 

## 2023-05-22 NOTE — Progress Notes (Signed)
Vss nad trans to pacu 

## 2023-05-22 NOTE — Progress Notes (Signed)
Called to room to assist during endoscopic procedure.  Patient ID and intended procedure confirmed with present staff. Received instructions for my participation in the procedure from the performing physician.  

## 2023-05-23 ENCOUNTER — Telehealth: Payer: Self-pay

## 2023-05-23 NOTE — Telephone Encounter (Signed)
  Follow up Call-     05/22/2023    7:40 AM 05/09/2022    1:17 PM  Call back number  Post procedure Call Back phone  # [phone]  Permission to leave phone message Yes Yes     Patient questions:  Do you have a fever, pain , or abdominal swelling? No. Pain Score  0 *  Have you tolerated food without any problems? Yes.    Have you been able to return to your normal activities? Yes.    Do you have any questions about your discharge instructions: Diet   No. Medications  No. Follow up visit  No.  Do you have questions or concerns about your Care? No.  Actions: * If pain score is 4 or above: No action needed, pain <4.

## 2023-05-24 LAB — SURGICAL PATHOLOGY

## 2023-05-29 ENCOUNTER — Ambulatory Visit: Payer: BLUE CROSS/BLUE SHIELD | Attending: Internal Medicine | Admitting: Internal Medicine

## 2023-05-29 ENCOUNTER — Encounter: Payer: Self-pay | Admitting: Internal Medicine

## 2023-05-29 VITALS — BP 150/90 | HR 64 | Ht 75.0 in | Wt 259.4 lb

## 2023-05-29 DIAGNOSIS — I10 Essential (primary) hypertension: Secondary | ICD-10-CM

## 2023-05-29 DIAGNOSIS — I351 Nonrheumatic aortic (valve) insufficiency: Secondary | ICD-10-CM | POA: Diagnosis not present

## 2023-05-29 DIAGNOSIS — I2510 Atherosclerotic heart disease of native coronary artery without angina pectoris: Secondary | ICD-10-CM | POA: Diagnosis not present

## 2023-05-29 DIAGNOSIS — I7121 Aneurysm of the ascending aorta, without rupture: Secondary | ICD-10-CM | POA: Insufficient documentation

## 2023-05-29 NOTE — Patient Instructions (Signed)
Medication Instructions:  Your physician recommends that you continue on your current medications as directed. Please refer to the Current Medication list given to you today.  *If you need a refill on your cardiac medications before your next appointment, please call your pharmacy*   Lab Work: NONE If you have labs (blood work) drawn today and your tests are completely normal, you will receive your results only by: MyChart Message (if you have MyChart) OR A paper copy in the mail If you have any lab test that is abnormal or we need to change your treatment, we will call you to review the results.   Testing/Procedures: JAN 2025- - Your physician has requested that you have an echocardiogram. Echocardiography is a painless test that uses sound waves to create images of your heart. It provides your doctor with information about the size and shape of your heart and how well your heart's chambers and valves are working. This procedure takes approximately one hour. There are no restrictions for this procedure. Please do NOT wear cologne, perfume, aftershave, or lotions (deodorant is allowed). Please arrive 15 minutes prior to your appointment time.    Follow-Up: At [facility], you and your health needs are our priority.  As part of our continuing mission to provide you with exceptional heart care, we have created designated Provider Care Teams.  These Care Teams include your primary Cardiologist (physician) and Advanced Practice Providers (APPs -  Physician Assistants and Nurse Practitioners) who all work together to provide you with the care you need, when you need it.   Your next appointment:   7 month(s)  Provider:   Christell Constant, MD

## 2023-05-29 NOTE — Progress Notes (Signed)
Cardiology Office Note:    Date:  05/29/2023   ID:  Robert [name], DOB May 03, 1971, MRN [id number]  PCP:  Bradd Canary, MD   [facility] Providers Cardiologist:  Christell Constant, MD     Referring MD: Bradd Canary, MD   CC: Aortopathy  History of Present Illness:    Robert [name] is a 52 y.o. male with a hx of HTN, HLD, DM, CAC.  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  He had new colorectal cancer. Found to have increase in aortic diameter.  Saw anesthesia leading to delay in surgery.  Ann Maki sees Dr. Laneta Simmers as well. 2024: New AKI after chemo due to high output ileostomy. Dr. Mosetta Putt has reduced the overall dosage of chemotherapy. Surgery was performed with no issues  Discussed the use of AI scribe software for clinical note transcription with the patient, who gave verbal consent to proceed.  Robert [name], a 52 year old individual with a history of hypertension, hyperlipidemia, and thoracic aortic aneurysm, presents for follow-up after colon cancer therapy and reversal surgery. He reports no issues post-surgery and has been recovering well. He recently underwent a follow-up colonoscopy and endoscopy, both of which were largely unremarkable, although he does have GERD.  The patient's blood pressure has been slightly elevated, but generally remains within the target range of  less than 130/80 on carvedilol. He also takes amlodipine as needed for blood pressure control (he ddi not take his medication today)  The patient reports experiencing a cycle of diarrhea and constipation, which he attributes to temporary colitis. He takes Imodium to manage this, but reports feeling bloated and tight when he becomes backed up. He notes that he can feel this discomfort more acutely when he engages in strenuous activity, but denies any exertional chest pain.  He reports that Dr. Laneta Simmers has advised him that he is currently in a "watch and wait" category, with surgery to be considered if he starts showing  more severe symptoms or if the aneurysm grows significantly.  The patient has a family history of Barrett's disease, but shows no evidence of this himself. He has four children, and it has been recommended that they be screened for a bicuspid aortic valve, given the patient's dilated aorta and CMR.   Past Medical History:  Diagnosis Date   Anemia    Arthralgia 01/11/2013   Diffuse, b/l Hips R>L Knees, ankles, low back   Arthritis    Back pain 12/08/2015   Cancer (HCC)    Coronary artery disease    Diabetes mellitus type 2 in obese 11/09/2012   Dyslipidemia 11/09/2012   Esophageal reflux 12/08/2015   Feeling grief 06/09/2020   History of migraine headaches    Hyperglycemia 11/09/2012   Hypertension    Neuropathy    hands and feet    Past Surgical History:  Procedure Laterality Date   DIVERTING ILEOSTOMY N/A 08/23/2022   Procedure: DIVERTING LOOP ILEOSTOMY;  Surgeon: Andria Meuse, MD;  Location: [facility];  Service: General;  Laterality: N/A;   EUS N/A 06/21/2022   Procedure: LOWER ENDOSCOPIC ULTRASOUND (EUS);  Surgeon: Lemar Lofty., MD;  Location: [facility];  Service: Gastroenterology;  Laterality: N/A;   FLEXIBLE SIGMOIDOSCOPY N/A 06/21/2022   Procedure: FLEXIBLE SIGMOIDOSCOPY;  Surgeon: Meridee Score Netty Starring., MD;  Location: [facility];  Service: Gastroenterology;  Laterality: N/A;   FLEXIBLE SIGMOIDOSCOPY N/A 08/23/2022   Procedure: FLEXIBLE SIGMOIDOSCOPY;  Surgeon: Andria Meuse, MD;  Location: [facility];  Service: General;  Laterality: N/A;  FLEXIBLE SIGMOIDOSCOPY N/A 03/20/2023   Procedure: DIAGNOSTIC FLEXIBLE SIGMOIDOSCOPY;  Surgeon: Andria Meuse, MD;  Location: [facility];  Service: General;  Laterality: N/A;   ILEOSTOMY CLOSURE N/A 03/20/2023   Procedure: TAKEDOWN OF DIVERTING LOOP ILEOSTOMY;  Surgeon: Andria Meuse, MD;  Location: [facility];  Service: General;  Laterality: N/A;   PORT-A-CATH REMOVAL N/A 05/03/2023   Procedure: REMOVAL  PORT-A-CATH;  Surgeon: Andria Meuse, MD;  Location: [facility];  Service: General;  Laterality: N/A;   PORTACATH PLACEMENT N/A 09/21/2022   Procedure: INSERTION PORT-A-CATH WITH ULTRASOUND GUIDANCE;  Surgeon: Andria Meuse, MD;  Location: [facility];  Service: General;  Laterality: N/A;   XI ROBOTIC ASSISTED LOWER ANTERIOR RESECTION N/A 08/23/2022   Procedure: XI ROBOTIC ASSISTED LOWER ANTERIOR RESECTION with Introperative assessment of profusion and TAP Block;  Surgeon: Andria Meuse, MD;  Location: [facility];  Service: General;  Laterality: N/A;    Current Medications: Current Meds  Medication Sig   amLODipine (NORVASC) 5 MG tablet TAKE 1 TABLET (5 MG TOTAL) BY MOUTH DAILY. (Patient taking differently: Take 5 mg by mouth daily as needed (blood pressure above 140).)   carvedilol (COREG) 25 MG tablet Take 1 tablet (25 mg total) by mouth 2 (two) times daily.   famotidine (PEPCID) 40 MG tablet Take 1 tablet (40 mg total) by mouth at bedtime. (Patient taking differently: Take 40 mg by mouth at bedtime as needed for heartburn or indigestion.)   gabapentin (NEURONTIN) 100 MG capsule TAKE 1-2 CAPSULES (100-200 MG TOTAL) BY MOUTH 3 (THREE) TIMES DAILY.   glucose blood (COOL BLOOD GLUCOSE TEST STRIPS) test strip Check blood sugar once daily   loperamide (IMODIUM) 2 MG capsule Take 2 mg by mouth as needed for diarrhea or loose stools.   metFORMIN (GLUCOPHAGE) 500 MG tablet TAKE 1 TABLET BY MOUTH TWICE A DAY (Patient taking differently: Take 500 mg by mouth daily.)   pantoprazole (PROTONIX) 40 MG tablet Take 1 tablet (40 mg total) by mouth daily.   sitaGLIPtin (JANUVIA) 100 MG tablet Take 1 tablet (100 mg total) by mouth daily.   tiZANidine (ZANAFLEX) 2 MG tablet Take 1-2 tablets (2-4 mg total) by mouth 3 (three) times daily as needed for muscle spasms.     Allergies:   Advil [ibuprofen], Aleve [naproxen], Mucinex [guaifenesin er], Nsaids, Levaquin [levofloxacin], and Latex   Social History    Socioeconomic History   Marital status: Married    Spouse name: Not on file   Number of children: 4   Years of education: Not on file   Highest education level: Bachelor's degree (e.g., BA, AB, BS)  Occupational History   Occupation: Community education officer: [employer]  Tobacco Use   Smoking status: Former    Current packs/day: 0.00    Average packs/day: 1.5 packs/day for 10.0 years (15.0 ttl pk-yrs)    Types: Cigarettes    Start date: 08/13/1985    Quit date: 08/14/1995    Years since quitting: 27.8   Smokeless tobacco: Never  Vaping Use   Vaping status: Never Used  Substance and Sexual Activity   Alcohol use: Yes    Alcohol/week: 5.0 standard drinks of alcohol    Types: 5 Cans of beer per week   Drug use: Not Currently    Types: Marijuana    Comment: last use 02/2023   Sexual activity: Yes    Comment: lives with wife and daughter, no dietary restrictions  Other Topics Concern   Not on  file  Social History Narrative   Regular exercise:  Walks 1-2 x weekly   Caffeine use:  3-4 daily   4 children- oldest first marriage 69 (son), 24 son, 10 son, 27 yr old girl   Married   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  (12/23/2022)   Overall Financial Resource Strain (CARDIA)    Difficulty of Paying Living Expenses: Not very hard  Food Insecurity: No Food Insecurity (03/20/2023)   Hunger Vital Sign    Worried About Running Out of Food in the Last Year: Never true    Ran Out of Food in the Last Year: Never true  Transportation Needs: No Transportation Needs (03/20/2023)   PRAPARE - Administrator, Civil Service (Medical): No    Lack of Transportation (Non-Medical): No  Physical Activity: Unknown (12/23/2022)   Exercise Vital Sign    Days of Exercise per Week: 5 days    Minutes of Exercise per Session: Patient declined  Stress: No Stress Concern Present (12/23/2022)    [employer] of Occupational Health - Occupational Stress Questionnaire    Feeling of Stress : Only a little  Social Connections: Moderately Integrated (12/23/2022)   Social Connection and Isolation Panel [NHANES]    Frequency of Communication with Friends and Family: Three times a week    Frequency of Social Gatherings with Friends and Family: Patient declined    Attends Religious Services: More than 4 times per year    Active Member of [employer] or Organizations: No    Attends Engineer, structural: Not on file    Marital Status: Married    Social: Married, Engineer, manufacturing  Family History: The patient's family history includes Arthritis in his father, maternal grandmother, mother, and paternal grandmother; Barrett's esophagus in his father; COPD in his maternal grandfather; Cancer in his mother and paternal grandmother; Diabetes in his brother, father, maternal grandmother, mother, paternal grandfather, and paternal grandmother; Heart disease (age of onset: 36) in his paternal grandfather; Hyperlipidemia in his brother; Hypertension in his brother, father, and mother; Obesity in his father; Sleep apnea in his father; Stroke in his maternal grandmother. There is no history of Kidney disease.  ROS:   Please see the history of present illness.     EKGs/Labs/Other Studies Reviewed:    The following studies were reviewed today:  EKG:   06/19/22: SR LVH   Cardiac Studies & Procedures       ECHOCARDIOGRAM  ECHOCARDIOGRAM COMPLETE 01/17/2023  Narrative ECHOCARDIOGRAM REPORT    Patient Name:   Robert [name] Date of Exam: 01/17/2023 Medical Rec #:  [id number]    Height:       75.0 in Accession #:    [id number]   Weight:       254.3 lb Date of Birth:  05/06/1971    BSA:          2.430 m Patient Age:    51 years     BP:           117/71 mmHg Patient Gender: M            HR:           64 bpm. Exam Location:  Outpatient  Procedure: 2D Echo, Cardiac Doppler, Color Doppler, 3D Echo and  Strain Analysis  Indications:    Aortic valve disorder  I35.9  History:        Patient has prior history of Echocardiogram examinations, most recent 07/13/2022. CAD; Risk Factors:Hypertension, Dyslipidemia and Diabetes. Ascending aortic aneurysm.  Sonographer:    Eulah Pont RDCS Referring Phys: [id number] Alleen Borne   Sonographer Comments: Global longitudinal strain was attempted. IMPRESSIONS   1. Left ventricular ejection fraction, by estimation, is 60 to 65%. Left ventricular ejection fraction by 3D volume is 60 %. The left ventricle has normal function. The left ventricle has no regional wall motion abnormalities. The left ventricular internal cavity size was mildly dilated. There is mild left ventricular hypertrophy. Left ventricular diastolic parameters are consistent with Grade I diastolic dysfunction (impaired relaxation). The average left ventricular global longitudinal strain is -18.3 %. The global longitudinal strain is normal. 2. Right ventricular systolic function is normal. The right ventricular size is normal. 3. The mitral valve is normal in structure. No evidence of mitral valve regurgitation. No evidence of mitral stenosis. 4. The aortic valve is tricuspid. Aortic valve regurgitation is moderate. No aortic stenosis is present. Aortic regurgitation PHT measures 604 msec. 5. Aneurysm of the aortic root, measuring 48 mm. Aneurysm of the ascending aorta, measuring 49 mm. There is moderate dilatation of the aortic root, measuring 48 mm. 6. The inferior vena cava is normal in size with greater than 50% respiratory variability, suggesting right atrial pressure of 3 mmHg.  Comparison(s): No significant change from prior study. Prior images reviewed side by side.  FINDINGS Left Ventricle: Left ventricular ejection fraction, by estimation, is 60 to 65%. Left ventricular ejection fraction by 3D volume is 60 %. The left ventricle has normal function. The left ventricle has no  regional wall motion abnormalities. The average left ventricular global longitudinal strain is -18.3 %. The global longitudinal strain is normal. The left ventricular internal cavity size was mildly dilated. There is mild left ventricular hypertrophy. Left ventricular diastolic parameters are consistent with Grade I diastolic dysfunction (impaired relaxation).  Right Ventricle: The right ventricular size is normal. No increase in right ventricular wall thickness. Right ventricular systolic function is normal.  Left Atrium: Left atrial size was normal in size.  Right Atrium: Right atrial size was normal in size.  Pericardium: There is no evidence of pericardial effusion.  Mitral Valve: The mitral valve is normal in structure. No evidence of mitral valve regurgitation. No evidence of mitral valve stenosis.  Tricuspid Valve: The tricuspid valve is normal in structure. Tricuspid valve regurgitation is not demonstrated. No evidence of tricuspid stenosis.  Aortic Valve: The aortic valve is tricuspid. Aortic valve regurgitation is moderate. Aortic regurgitation PHT measures 604 msec. No aortic stenosis is present. Aortic valve mean gradient measures 3.0 mmHg. Aortic valve peak gradient measures 6.1 mmHg. Aortic valve area, by VTI measures 3.88 cm.  Pulmonic Valve: The pulmonic valve was normal in structure. Pulmonic valve regurgitation is trivial. No evidence of pulmonic stenosis.  Aorta: The aortic root is normal in size and structure. There is moderate dilatation of the aortic root, measuring 48 mm. There is an aneurysm involving the aortic root measuring 48 mm. There is an aneurysm involving the ascending aorta measuring 49 mm.  Venous: The inferior vena cava is normal in size with greater than 50% respiratory variability, suggesting right atrial pressure of 3 mmHg.  IAS/Shunts: No atrial level shunt detected by color flow Doppler.   LEFT VENTRICLE PLAX 2D LVIDd:         6.00 cm  Diastology LVIDs:         3.30 cm         LV e' medial:    4.95 cm/s LV PW:         1.30 cm         LV E/e' medial:  11.9 LV IVS:        1.30 cm         LV e' lateral:   5.36 cm/s LVOT diam:     2.40 cm         LV E/e' lateral: 11.0 LV SV:         115 LV SV Index:   47              2D LVOT Area:     4.52 cm        Longitudinal Strain 2D Strain GLS  -18.3 % LV Volumes (MOD)               Avg: LV vol d, MOD    151.0 ml A2C:                           3D Volume EF LV vol d, MOD    163.0 ml      LV 3D EF:    Left A4C:                                        ventricul LV vol s, MOD    63.1 ml                    ar A2C:                                        ejection LV vol s, MOD    58.0 ml                    fraction A4C:                                        by 3D LV SV MOD A2C:   87.9 ml                    volume is LV SV MOD A4C:   163.0 ml                   60 %. LV SV MOD BP:    96.4 ml  3D Volume EF: 3D EF:        60 % LV EDV:       226 ml LV ESV:       90 ml LV SV:        136 ml  RIGHT VENTRICLE RV S prime:     14.80 cm/s TAPSE (M-mode): 2.3 cm  LEFT ATRIUM             Index        RIGHT ATRIUM           Index LA diam:        4.50 cm 1.85 cm/m   RA Area:     16.80 cm  LA Vol (A2C):   73.9 ml 30.41 ml/m  RA Volume:   34.80 ml  14.32 ml/m LA Vol (A4C):   76.6 ml 31.52 ml/m LA Biplane Vol: 76.5 ml 31.48 ml/m AORTIC VALVE                    PULMONIC VALVE AV Area (Vmax):    4.56 cm     PR End Diast Vel: 3.71 msec AV Area (Vmean):   4.53 cm AV Area (VTI):     3.88 cm AV Vmax:           123.00 cm/s AV Vmean:          83.800 cm/s AV VTI:            0.297 m AV Peak Grad:      6.1 mmHg AV Mean Grad:      3.0 mmHg LVOT Vmax:         124.00 cm/s LVOT Vmean:        83.900 cm/s LVOT VTI:          0.255 m LVOT/AV VTI ratio: 0.86 AI PHT:            604 msec AR Vena Contracta: 0.40 cm  AORTA Ao Root diam: 4.75 cm Ao Asc diam:  4.90 cm  MITRAL VALVE MV Area (PHT):  2.42 cm    SHUNTS MV Decel Time: 313 msec    Systemic VTI:  0.26 m MV E velocity: 58.80 cm/s  Systemic Diam: 2.40 cm MV A velocity: 69.50 cm/s MV E/A ratio:  0.85  Donato Schultz MD Electronically signed by Donato Schultz MD Signature Date/Time: 01/17/2023/2:45:55 PM    Final      CARDIAC MRI  MR CARDIAC MORPHOLOGY W WO CONTRAST 10/22/2022  Addendum 12/24/2022  8:11 AM ADDENDUM REPORT: 12/24/2022 08:08  ADDENDUM: Reviewing case for potential surgical planning. Though there is heavy breath hold artifact on aortic valve CINE, there is evidence of a small raphe. 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   Electronically Signed By: Riley Lam M.D. On: 12/24/2022 08:08  Narrative CLINICAL DATA:  Clinical question of aortopathy and aortic regurgitation Study assumes HCT of 37 and BSA of 2.53 m2.  EXAM: CARDIAC MRI  TECHNIQUE: The patient was scanned on a 1.5 Tesla GE magnet. A dedicated cardiac coil was used. Functional imaging was done using Fiesta sequences. 2,3, and 4 chamber views were done to assess for RWMA's. Modified Simpson's rule using a short axis stack was used to calculate an ejection fraction on a dedicated work Research officer, trade union. The patient received 10 cc of Gadavist. After 10 minutes inversion recovery sequences were used to assess for infiltration and scar tissue. Flow quantification was performed 2 times during this examination with flow quantification performed at the levels of the ascending aorta above the valve, pulmonary artery above the valve.  CONTRAST:  10 cc  of Gadavist  FINDINGS: 1. Mild left ventricular dilation, with LVEDD 68 mm, but LVEDVi 99 mL/m2.  Normal left ventricular thickness, with intraventricular septal thickness of 11 mm, posterior wall thickness of 9 mm, and myocardial mass index of 62 g/m2.  Normal left ventricular systolic function (LVEF  =63%). There are no regional wall motion abnormalities.  Left ventricular parametric mapping notable for normal T2.  There is mid inferior ECV elevation (43%)  There is late gadolinium enhancement in the left ventricular myocardium: There is  basal inferoseptal LGE (inferior RV insertion site LGE) of unclear significance.  There is also mid inferior transmural LGE.  2.  Dilated right ventricular size with RVEDVI 91 mL/m2.  Normal right ventricular thickness.  Normal right ventricular systolic function (RVEF =59%). There are no regional wall motion abnormalities or aneurysms.  3.  Normal left size.  Right atrial dilation with maximal volume 48 ml/m2.  4.  Mild pulmonary artery dilation.  Aortic root is severely dilated 52 mm at sinus to sinus evaluation.  Ascending aorta is moderately dilated in the ascending aorta, 49 mm.  There is no evidence of aortic dissection.  5. Valve assessment:  Aortic Valve: Thickening of the leaflet tips; tri-leaflet valve. There is moderate aortic regurgitation. Regurgitant fraction 22%.  Pulmonic Valve: There is no significant regurgitation. Regurgitant fraction < 1%.  Tricuspid Valve: There is mild tricuspid regurgitation Regurgitant fraction 15%.  Mitral Valve: There is no significant regurgitation. Regurgitant fraction 4%.  6.  Normal pericardium.  No pericardial effusion.  7. Grossly, no extracardiac findings. Recommended dedicated study if concerned for non-cardiac pathology.  8.  Breathhold artifact.  Much of study is done free breathing.  IMPRESSION: Severe aortic root dilation.  See MRA for more details.  There is inferior LGE. Given two discrete areas of LGE, clinical correlation for inflammatory disease.  Riley Lam MD  Electronically Signed: By: Riley Lam M.D. On: 10/22/2022 22:30          Recent Labs: 12/10/2022: Magnesium 1.9 02/05/2023: TSH 0.75 02/18/2023: ALT 15 04/24/2023: BUN 11;  Creatinine, Ser 1.33; Potassium 3.7; Sodium 139 05/03/2023: Hemoglobin 8.6; Platelets 203  Recent Lipid Panel    Component Value Date/Time   CHOL 142 02/05/2023 1716   TRIG 295.0 (H) 02/05/2023 1716   HDL 38.90 (L) 02/05/2023 1716   CHOLHDL 4 02/05/2023 1716   VLDL 59.0 (H) 02/05/2023 1716   LDLCALC 72 09/29/2022 0735   LDLCALC 100 (H) 06/09/2020 1613   LDLDIRECT 71.0 02/05/2023 1716    Physical Exam:    VS:  BP (!) 150/90   Pulse 64   Ht 6\' 3"  (1.905 m)   Wt 259 lb 6.4 oz (117.7 kg)   SpO2 97%   BMI 32.42 kg/m     Wt Readings from Last 3 Encounters:  05/29/23 259 lb 6.4 oz (117.7 kg)  05/03/23 257 lb 6.4 oz (116.8 kg)  04/24/23 257 lb 6.4 oz (116.8 kg)    GEN:  Well nourished, well developed in no acute distress HEENT: Normal NECK: No JVD CARDIAC: RRR, no rubs, gallops; diastolic murmur noted  RESPIRATORY:  Clear to auscultation without rales, wheezing or rhonchi  ABDOMEN: Soft, non-tender, non-distended MUSCULOSKELETAL:  No edema; No deformity  SKIN: Warm and dry NEUROLOGIC:  Alert and oriented x 3 PSYCHIATRIC:  Normal affect   ASSESSMENT:    1. Aneurysm of ascending aorta without rupture (HCC)   2. Nonrheumatic aortic valve insufficiency   3. Coronary artery calcification   4. Primary hypertension     PLAN:     Thoracic Aortic Aneurysm - Severe, Stable, asymptomatic. Discussed the indications for surgery (severe aortic regurgitation, aneurysm size >40mm, or symptomatic). Discussed the potential need for a transesophageal echocardiogram to confirm suspected bicuspid aortic valve (3 sinus, one raphe). - with moderate aortic regurgitation -Continue current management; he follows with Dr. Laneta Simmers from [facility] -CT scan scheduled for next month. - will get echo in January -If patient experiences new symptoms (shortness of breath, fatigue), consider transesophageal echocardiogram to  assess aortic valve. - discussed family screening  Hypertension - Blood pressure  slightly elevated today, but patient reports it is usually within target range (he did not take his medication today) Discussed the importance of aggressive blood pressure control due to thoracic aortic aneurysm. -Continue carvedilol and amlodipine as needed.  Coronary artery calcification Hyperlipidemia with DM Discussed the potential need for cholesterol medication in the future due to minimal coronary artery disease and calcium buildup in arteries. -Continue current management. -Consider cholesterol medication if cholesterol remains high at next visit ( he has lost a lot of weight secondary to malignancy  Post-Colonoscopy/Endoscopy Follow-up Post-Colon Cancer Surgery Follow-up - No issues reported. Patient has GERD but no evidence of Barrett's esophagus. -Continue current management per onc and PCP.  Follow-up Plan to see patient in late May, assuming November scan looks good. If patient experiences new symptoms, consider adding coronary artery CT scan. If patient experiences tearing chest or back pain, advise immediate emergency room visit.      Medication Adjustments/Labs and Tests Ordered: Current medicines are reviewed at length with the patient today.  Concerns regarding medicines are outlined above.  Orders Placed This Encounter  Procedures   ECHOCARDIOGRAM COMPLETE   No orders of the defined types were placed in this encounter.   Patient Instructions  Medication Instructions:  Your physician recommends that you continue on your current medications as directed. Please refer to the Current Medication list given to you today.  *If you need a refill on your cardiac medications before your next appointment, please call your pharmacy*   Lab Work: NONE If you have labs (blood work) drawn today and your tests are completely normal, you will receive your results only by: MyChart Message (if you have MyChart) OR A paper copy in the mail If you have any lab test that is  abnormal or we need to change your treatment, we will call you to review the results.   Testing/Procedures: JAN 2025- - Your physician has requested that you have an echocardiogram. Echocardiography is a painless test that uses sound waves to create images of your heart. It provides your doctor with information about the size and shape of your heart and how well your heart's chambers and valves are working. This procedure takes approximately one hour. There are no restrictions for this procedure. Please do NOT wear cologne, perfume, aftershave, or lotions (deodorant is allowed). Please arrive 15 minutes prior to your appointment time.    Follow-Up: At [facility], you and your health needs are our priority.  As part of our continuing mission to provide you with exceptional heart care, we have created designated Provider Care Teams.  These Care Teams include your primary Cardiologist (physician) and Advanced Practice Providers (APPs -  Physician Assistants and Nurse Practitioners) who all work together to provide you with the care you need, when you need it.   Your next appointment:   7 month(s)  Provider:   Christell Constant, MD        Signed, Christell Constant, MD  05/29/2023 11:07 AM    [facility]

## 2023-05-30 ENCOUNTER — Encounter: Payer: Self-pay | Admitting: Gastroenterology

## 2023-05-31 ENCOUNTER — Other Ambulatory Visit: Payer: Self-pay

## 2023-06-12 ENCOUNTER — Telehealth: Payer: Self-pay | Admitting: Hematology

## 2023-06-12 ENCOUNTER — Other Ambulatory Visit: Payer: Self-pay | Admitting: Family Medicine

## 2023-06-12 ENCOUNTER — Other Ambulatory Visit: Payer: Self-pay | Admitting: Nurse Practitioner

## 2023-06-12 DIAGNOSIS — M792 Neuralgia and neuritis, unspecified: Secondary | ICD-10-CM

## 2023-06-13 ENCOUNTER — Encounter: Payer: Self-pay | Admitting: Hematology

## 2023-06-17 ENCOUNTER — Other Ambulatory Visit: Payer: BLUE CROSS/BLUE SHIELD

## 2023-06-17 ENCOUNTER — Ambulatory Visit: Payer: BLUE CROSS/BLUE SHIELD | Admitting: Hematology

## 2023-06-17 NOTE — Assessment & Plan Note (Signed)
-  pT3N0Mo stage IIA -Diagnosed in 04/2022 -Proceeded with LAR and diverting ileostomy with Dr. Cliffton Asters on 08/23/2022.  -Case again discussed in conference and the recommendation is for 3 months adjuvant chemo. Dr. Mitzi Hansen felt that he could forego radiation -Began adjuvant FOLFOX 10/10/2022, cycle 5 held due to diarrhea and AKI which required hospital admission. -he completed in early June 2024 -CT chest in May 2024 showed consolidation change in RLL lung. I reviewed his PET scan done on 7/29 which was negative for hypermetabolic lesions, no concerns for recurrence of cancer -continue cancer surveillance

## 2023-06-18 ENCOUNTER — Other Ambulatory Visit: Payer: Self-pay

## 2023-06-18 ENCOUNTER — Other Ambulatory Visit: Payer: Self-pay | Admitting: Hematology

## 2023-06-18 ENCOUNTER — Inpatient Hospital Stay: Payer: BLUE CROSS/BLUE SHIELD | Attending: Nurse Practitioner

## 2023-06-18 ENCOUNTER — Inpatient Hospital Stay (HOSPITAL_BASED_OUTPATIENT_CLINIC_OR_DEPARTMENT_OTHER): Payer: BLUE CROSS/BLUE SHIELD | Admitting: Hematology

## 2023-06-18 VITALS — BP 149/84 | HR 65 | Temp 98.6°F | Resp 18 | Ht 75.0 in | Wt 259.8 lb

## 2023-06-18 DIAGNOSIS — Z85048 Personal history of other malignant neoplasm of rectum, rectosigmoid junction, and anus: Secondary | ICD-10-CM | POA: Insufficient documentation

## 2023-06-18 DIAGNOSIS — K2270 Barrett's esophagus without dysplasia: Secondary | ICD-10-CM | POA: Insufficient documentation

## 2023-06-18 DIAGNOSIS — M792 Neuralgia and neuritis, unspecified: Secondary | ICD-10-CM | POA: Diagnosis not present

## 2023-06-18 DIAGNOSIS — G629 Polyneuropathy, unspecified: Secondary | ICD-10-CM | POA: Diagnosis not present

## 2023-06-18 DIAGNOSIS — Z08 Encounter for follow-up examination after completed treatment for malignant neoplasm: Secondary | ICD-10-CM | POA: Insufficient documentation

## 2023-06-18 DIAGNOSIS — I10 Essential (primary) hypertension: Secondary | ICD-10-CM | POA: Insufficient documentation

## 2023-06-18 DIAGNOSIS — C20 Malignant neoplasm of rectum: Secondary | ICD-10-CM

## 2023-06-18 DIAGNOSIS — D649 Anemia, unspecified: Secondary | ICD-10-CM | POA: Diagnosis not present

## 2023-06-18 LAB — CMP (CANCER CENTER ONLY)
ALT: 14 U/L (ref 0–44)
AST: 16 U/L (ref 15–41)
Albumin: 4.1 g/dL (ref 3.5–5.0)
Alkaline Phosphatase: 56 U/L (ref 38–126)
Anion gap: 6 (ref 5–15)
BUN: 16 mg/dL (ref 6–20)
CO2: 28 mmol/L (ref 22–32)
Calcium: 9.3 mg/dL (ref 8.9–10.3)
Chloride: 106 mmol/L (ref 98–111)
Creatinine: 1.31 mg/dL — ABNORMAL HIGH (ref 0.61–1.24)
GFR, Estimated: >60 mL/min (ref >60)
Glucose, Bld: 155 mg/dL — ABNORMAL HIGH (ref 70–99)
Potassium: 4 mmol/L (ref 3.5–5.1)
Sodium: 140 mmol/L (ref 135–145)
Total Bilirubin: 0.6 mg/dL (ref <1.2)
Total Protein: 6.8 g/dL (ref 6.5–8.1)

## 2023-06-18 LAB — CBC WITH DIFFERENTIAL (CANCER CENTER ONLY)
Abs Immature Granulocytes: 0.02 10*3/uL (ref 0.00–0.07)
Basophils Absolute: 0.1 10*3/uL (ref 0.0–0.1)
Basophils Relative: 1 %
Eosinophils Absolute: 0.3 10*3/uL (ref 0.0–0.5)
Eosinophils Relative: 5 %
HCT: 31.6 % — ABNORMAL LOW (ref 39.0–52.0)
Hemoglobin: 9.5 g/dL — ABNORMAL LOW (ref 13.0–17.0)
Immature Granulocytes: 0 %
Lymphocytes Relative: 28 %
Lymphs Abs: 1.8 10*3/uL (ref 0.7–4.0)
MCH: 22.1 pg — ABNORMAL LOW (ref 26.0–34.0)
MCHC: 30.1 g/dL (ref 30.0–36.0)
MCV: 73.7 fL — ABNORMAL LOW (ref 80.0–100.0)
Monocytes Absolute: 0.5 10*3/uL (ref 0.1–1.0)
Monocytes Relative: 8 %
Neutro Abs: 3.8 10*3/uL (ref 1.7–7.7)
Neutrophils Relative %: 58 %
Platelet Count: 196 10*3/uL (ref 150–400)
RBC: 4.29 MIL/uL (ref 4.22–5.81)
RDW: 16 % — ABNORMAL HIGH (ref 11.5–15.5)
WBC Count: 6.5 10*3/uL (ref 4.0–10.5)
nRBC: 0 % (ref 0.0–0.2)

## 2023-06-18 MED ORDER — GABAPENTIN 100 MG PO CAPS: 100.0000 mg | ORAL_CAPSULE | Freq: Two times a day (BID) | ORAL | 2 refills | Status: DC

## 2023-06-18 MED ORDER — ACCRUFER 30 MG PO CAPS: 30.0000 mg | ORAL_CAPSULE | Freq: Every day | ORAL | 2 refills | Status: DC

## 2023-06-18 NOTE — Progress Notes (Signed)
[facility]   Telephone:(336) [phone] Fax:(336) [phone]   Clinic Follow up Note   Patient Care Team: Bradd Canary, MD as PCP - General (Family Medicine) Christell Constant, MD as PCP - Cardiology (Cardiology) Malachy Mood, MD as Consulting Physician (Oncology) Pollyann Samples, NP as Nurse Practitioner (Oncology) Marily Lente, RN (Inactive) as Oncology Nurse Navigator (Oncology)  Date of Service:  06/18/2023  CHIEF COMPLAINT: f/u of rectal cancer   CURRENT THERAPY:  Cancer surveillance  Oncology History   Rectal cancer (HCC) -pT3N0Mo stage IIA -Diagnosed in 04/2022 -Proceeded with LAR and diverting ileostomy with Dr. Cliffton Asters on 08/23/2022.  -Case again discussed in conference and the recommendation is for 3 months adjuvant chemo. Dr. Mitzi Hansen felt that he could forego radiation -Began adjuvant FOLFOX 10/10/2022, cycle 5 held due to diarrhea and AKI which required hospital admission. -he completed in early June 2024 -CT chest in May 2024 showed consolidation change in RLL lung. I reviewed his PET scan done on 7/29 which was negative for hypermetabolic lesions, no concerns for recurrence of cancer -continue cancer surveillance      Assessment and Plan    Rectal Cancer  -Patient underwent ileostomy reversal and port removal on May 03, 2023  -patient is experiencing a balance between diarrhea and constipation post ileostomy takedown. -He is clinically doing well, lab reviewed, no concern for recurrence. -Continue current regimen of Imodium as needed.  Peripheral Neuropathy Patient reports improvement in hand symptoms but persistent tingling and numbness in feet, especially at night. -Increase Gabapentin to 1 tablet in the morning and 3 at night. -Refill Gabapentin prescription with 120 tablets and 2 refills.  Hypertension Patient reports blood pressure has been creeping up and is considering resuming Amlodipine. -Resume Amlodipine daily due to elevated  blood pressure at visit.  Barrett's Esophagus Patient recently diagnosed with Barrett's Esophagus on upper endoscopy. -Plan for repeat upper endoscopy in 3 years.  Anemia Hemoglobin is low at 9.5, possibly due to recent surgery -Prescribe new iron supplement with fewer GI side effects for trial. -Check iron levels and blood counts at next visit in 3 months.  Follow-up Plans -Lab and follow-up in 3 months -Plan to repeat CT scan in July 2024     SUMMARY OF ONCOLOGIC HISTORY: Oncology History Overview Note   Cancer Staging  Rectal cancer [facility]) Staging form: Colon and Rectum, AJCC 8th Edition - Pathologic stage from 08/23/2022: Stage IIA (pT3, pN0, cM0) - Signed by Pollyann Samples, NP on 09/10/2022 Stage prefix: Initial diagnosis Total positive nodes: 0     Rectal cancer (HCC)  05/09/2022 Procedure   Colonoscopy by Dr. Russella Dar, impression - Five 5 to 7 mm polyps in the rectum, in the transverse colon and at the hepatic flexure, removed with a cold snare. Resected and retrieved. removed with a cold snare. Resected and retrieved. 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 Submucosal injection tattoo. - Internal hemorrhoids. - The examination was otherwise normal on direct and retroflexion views.   05/09/2022 Initial Biopsy   Diagnosis 1. Surgical [P], colon, transverse x1 and hepatic flexure x 3 (2 pieces only, 2 stuck together taken off together), polyp (4) - TUBULAR ADENOMA(S) - NEGATIVE FOR HIGH-GRADE DYSPLASIA OR MALIGNANCY 2. Surgical [P], colon, rectal mass - INVASIVE MODERATELY DIFFERENTIATED ADENOCARCINOMA. SEE NOTE 3. Surgical [P], colon, rectum, polyp (1) - HYPERPLASTIC POLYP   05/09/2022 Tumor Marker   CEA < 2.0   05/17/2022 Imaging   IMPRESSION: 1. Known primary malignancy is  not well visualized. Correlate with recent colonoscopy. 2. No evidence of metastatic disease in the chest, abdomen or pelvis. 3. Mildly enlarged bilateral inguinal lymph nodes,  likely reactive. 4. 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 Recommend annual imaging followup by CTA or MRA. This recommendation follows 2010 ACCF/AHA/AATS/ACR/ASA/SCA/SCAI/SIR/STS/SVM Guidelines for the Diagnosis and Management of Patients with Thoracic Aortic Disease. Circulation. 2010; 121: O962-X528. 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 Severe coronary artery calcifications of the LAD and circumflex. 6. Aortic Atherosclerosis (ICD10-I70.0).   05/23/2022 Initial Diagnosis   Rectal cancer (HCC)   08/23/2022 Cancer Staging   Staging form: Colon and Rectum, AJCC 8th Edition - Pathologic stage from 08/23/2022: Stage IIA (pT3, pN0, cM0) - Signed by Pollyann Samples, NP on 09/10/2022 Stage prefix: Initial diagnosis Total positive nodes: 0   08/23/2022 Surgery   PROCEDURE:  Robotic assisted low anterior resection with diverting loop ileostomy SURGEON: Stephanie Coup. Cliffton Asters, MD ASSISTANT: Romie Levee, MD   08/23/2022 Pathology Results   FINAL MICROSCOPIC DIAGNOSIS:  A. COLON, RECTOSIGMOID, RESECTION:  - Invasive moderately differentiated adenocarcinoma, 2.5 cm, involving rectum  - Carcinoma invades focally into the perirectal adipose tissue  - Resection margins are negative for carcinoma  - Negative for lymphovascular invasion  - Fourteen lymph nodes, negative for carcinoma (0/14)  - See oncology table  B. DISTAL MARGIN DONUT:  - Rectal donut, negative for carcinoma   Regional Lymph Nodes:       Number of Lymph Nodes with Tumor: 0       Number of Lymph Nodes Examined: 14  Pathologic Stage Classification (pTNM, AJCC 8th Edition): pT3, pN0  Mismatch Repair Protein (IHC)  SUMMARY INTERPRETATION: NORMAL  MSI-Stable   09/27/2022 - 09/27/2022 Chemotherapy   Patient is on Treatment Plan : RECTAL Xelox (Capeox) (130/850) q21d x 6 cycles     10/10/2022 -  Chemotherapy   Patient is on Treatment Plan : COLORECTAL FOLFOX q14d x 3 months        Discussed the use of AI scribe  software for clinical note transcription with the patient, who gave verbal consent to proceed.  History of Present Illness   The patient, a 52 year old male with a history of rectal cancer, presents for follow-up. He reports ongoing recovery from ileostomy surgery, with a primary issue of managing diarrhea. The patient describes a balancing act between taking Imodium and experiencing constipation and bloating. He also reports neuropathy in his hands and feet, which is managed with carbapentin. The neuropathy is worse in the feet, causing tingling and numbness that can wake the patient from sleep. The patient also mentions having Barrett's esophagus and a small hiatal hernia, both of which contribute to acid reflux. The patient manages reflux with famotidine and Protonix as needed. He also reports a recent colonoscopy and upper endoscopy, which were clear except for the Barrett's esophagus. The patient also mentions managing blood pressure with amlodipine, which he is considering taking regularly due to a recent increase in blood pressure.         All other systems were reviewed with the patient and are negative.  MEDICAL HISTORY:  Past Medical History:  Diagnosis Date   Anemia    Arthralgia 01/11/2013   Diffuse, b/l Hips R>L Knees, ankles, low back   Arthritis    Back pain 12/08/2015   Cancer (HCC)    Coronary artery disease    Diabetes mellitus type 2 in obese 11/09/2012   Dyslipidemia 11/09/2012   Esophageal reflux 12/08/2015   Feeling  grief 06/09/2020   History of migraine headaches    Hyperglycemia 11/09/2012   Hypertension    Neuropathy    hands and feet    SURGICAL HISTORY: Past Surgical History:  Procedure Laterality Date   DIVERTING ILEOSTOMY N/A 08/23/2022   Procedure: DIVERTING LOOP ILEOSTOMY;  Surgeon: Andria Meuse, MD;  Location: [facility];  Service: General;  Laterality: N/A;   EUS N/A 06/21/2022   Procedure: LOWER ENDOSCOPIC ULTRASOUND (EUS);  Surgeon:  Lemar Lofty., MD;  Location: [facility];  Service: Gastroenterology;  Laterality: N/A;   FLEXIBLE SIGMOIDOSCOPY N/A 06/21/2022   Procedure: FLEXIBLE SIGMOIDOSCOPY;  Surgeon: Meridee Score Netty Starring., MD;  Location: [facility];  Service: Gastroenterology;  Laterality: N/A;   FLEXIBLE SIGMOIDOSCOPY N/A 08/23/2022   Procedure: FLEXIBLE SIGMOIDOSCOPY;  Surgeon: Andria Meuse, MD;  Location: [facility];  Service: General;  Laterality: N/A;   FLEXIBLE SIGMOIDOSCOPY N/A 03/20/2023   Procedure: DIAGNOSTIC FLEXIBLE SIGMOIDOSCOPY;  Surgeon: Andria Meuse, MD;  Location: [facility];  Service: General;  Laterality: N/A;   ILEOSTOMY CLOSURE N/A 03/20/2023   Procedure: TAKEDOWN OF DIVERTING LOOP ILEOSTOMY;  Surgeon: Andria Meuse, MD;  Location: [facility];  Service: General;  Laterality: N/A;   PORT-A-CATH REMOVAL N/A 05/03/2023   Procedure: REMOVAL PORT-A-CATH;  Surgeon: Andria Meuse, MD;  Location: [facility];  Service: General;  Laterality: N/A;   PORTACATH PLACEMENT N/A 09/21/2022   Procedure: INSERTION PORT-A-CATH WITH ULTRASOUND GUIDANCE;  Surgeon: Andria Meuse, MD;  Location: [facility];  Service: General;  Laterality: N/A;   XI ROBOTIC ASSISTED LOWER ANTERIOR RESECTION N/A 08/23/2022   Procedure: XI ROBOTIC ASSISTED LOWER ANTERIOR RESECTION with Introperative assessment of profusion and TAP Block;  Surgeon: Andria Meuse, MD;  Location: [facility];  Service: General;  Laterality: N/A;    I have reviewed the social history and family history with the patient and they are unchanged from previous note.  ALLERGIES:  is allergic to advil [ibuprofen], aleve [naproxen], mucinex [guaifenesin er], nsaids, levaquin [levofloxacin], and latex.  MEDICATIONS:  Current Outpatient Medications  Medication Sig Dispense Refill   Ferric Maltol (ACCRUFER) 30 MG CAPS Take 1 capsule (30 mg total) by mouth daily. 30 capsule 2   amLODipine (NORVASC) 5 MG tablet TAKE 1 TABLET (5 MG TOTAL) BY MOUTH  DAILY. (Patient taking differently: Take 5 mg by mouth daily as needed (blood pressure above 140).) 90 tablet 1   carvedilol (COREG) 25 MG tablet Take 1 tablet (25 mg total) by mouth 2 (two) times daily. 180 tablet 3   famotidine (PEPCID) 40 MG tablet Take 1 tablet (40 mg total) by mouth at bedtime. (Patient taking differently: Take 40 mg by mouth at bedtime as needed for heartburn or indigestion.) 30 tablet 3   gabapentin (NEURONTIN) 100 MG capsule Take 1 capsule (100 mg total) by mouth 2 (two) times daily. TAKE 1 IN MORNING AND 3 AT NIGHT 120 capsule 2   glucose blood (COOL BLOOD GLUCOSE TEST STRIPS) test strip Check blood sugar once daily 100 each 12   loperamide (IMODIUM) 2 MG capsule Take 2 mg by mouth as needed for diarrhea or loose stools.     metFORMIN (GLUCOPHAGE) 500 MG tablet TAKE 1 TABLET BY MOUTH TWICE A DAY (Patient taking differently: Take 500 mg by mouth daily.) 180 tablet 1   pantoprazole (PROTONIX) 40 MG tablet TAKE 1 TABLET BY MOUTH EVERY DAY *ONLY COVERS 90 DAYS 90 tablet 1   sitaGLIPtin (JANUVIA) 100 MG tablet Take 1 tablet (100  mg total) by mouth daily. 90 tablet 1   tiZANidine (ZANAFLEX) 2 MG tablet Take 1-2 tablets (2-4 mg total) by mouth 3 (three) times daily as needed for muscle spasms. 60 tablet 2   No current facility-administered medications for this visit.    PHYSICAL EXAMINATION: ECOG PERFORMANCE STATUS: 0 - Asymptomatic  Vitals:   06/18/23 0958  BP: (!) 149/84  Pulse: 65  Resp: 18  Temp: 98.6 F (37 C)  SpO2: 95%   Wt Readings from Last 3 Encounters:  06/18/23 259 lb 12.8 oz (117.8 kg)  05/29/23 259 lb 6.4 oz (117.7 kg)  05/03/23 257 lb 6.4 oz (116.8 kg)     GENERAL:alert, no distress and comfortable SKIN: skin color, texture, turgor are normal, no rashes or significant lesions EYES: normal, Conjunctiva are pink and non-injected, sclera clear NECK: supple, thyroid normal size, non-tender, without nodularity LYMPH:  no palpable lymphadenopathy in the  cervical, axillary or inguinal LUNGS: clear to auscultation and percussion with normal breathing effort HEART: regular rate & rhythm and no murmurs and no lower extremity edema ABDOMEN:abdomen soft, non-tender and normal bowel sounds Musculoskeletal:no cyanosis of digits and no clubbing  NEURO: alert & oriented x 3 with fluent speech, no focal motor/sensory deficits  LABORATORY DATA:  I have reviewed the data as listed    Latest Ref Rng & Units 06/18/2023    9:40 AM 05/03/2023    8:15 AM 04/24/2023    9:20 AM  CBC  WBC 4.0 - 10.5 K/uL 6.5  5.2  5.3   Hemoglobin 13.0 - 17.0 g/dL 9.5  8.6  8.6   Hematocrit 39.0 - 52.0 % 31.6  29.5  29.2   Platelets 150 - 400 K/uL 196  203  181         Latest Ref Rng & Units 06/18/2023    9:40 AM 04/24/2023    9:20 AM 03/22/2023    4:27 AM  CMP  Glucose 70 - 99 mg/dL 086  578  469   BUN 6 - 20 mg/dL 16  11  18    Creatinine 0.61 - 1.24 mg/dL 6.29  5.28  4.13   Sodium 135 - 145 mmol/L 140  139  137   Potassium 3.5 - 5.1 mmol/L 4.0  3.7  3.9   Chloride 98 - 111 mmol/L 106  106  105   CO2 22 - 32 mmol/L 28  25  25    Calcium 8.9 - 10.3 mg/dL 9.3  8.6  8.3   Total Protein 6.5 - 8.1 g/dL 6.8     Total Bilirubin <1.2 mg/dL 0.6     Alkaline Phos 38 - 126 U/L 56     AST 15 - 41 U/L 16     ALT 0 - 44 U/L 14         RADIOGRAPHIC STUDIES: I have personally reviewed the radiological images as listed and agreed with the findings in the report. No results found.    No orders of the defined types were placed in this encounter.  All questions were answered. The patient knows to call the clinic with any problems, questions or concerns. No barriers to learning was detected. The total time spent in the appointment was 25 minutes.     Malachy Mood, MD 06/18/2023

## 2023-06-19 ENCOUNTER — Ambulatory Visit
Admission: RE | Admit: 2023-06-19 | Discharge: 2023-06-19 | Disposition: A | Discharge status: Home / Self Care | Payer: BLUE CROSS/BLUE SHIELD | Source: Ambulatory Visit | Attending: Surgery | Admitting: Surgery

## 2023-06-19 DIAGNOSIS — I7121 Aneurysm of the ascending aorta, without rupture: Secondary | ICD-10-CM

## 2023-06-19 MED ORDER — IOPAMIDOL (ISOVUE-370) INJECTION 76%
500.0000 mL | Freq: Once | INTRAVENOUS | Status: AC | PRN
  Administered 2023-06-19: 75 mL via INTRAVENOUS

## 2023-06-20 ENCOUNTER — Telehealth: Payer: Self-pay

## 2023-06-20 ENCOUNTER — Other Ambulatory Visit: Payer: Self-pay

## 2023-06-20 NOTE — Telephone Encounter (Signed)
Spoke with pt via telephone to inform pt that the prescription Dr. Mosetta Putt wrote for Robert [name] is not covered by pt's insurance per pt's pharmacy.  Stated Dr. Mosetta Putt would like the pt to take OTC Iron pill 325mg  instead.  Pt verbalized understanding and had no further questions or concerns.

## 2023-06-20 NOTE — Progress Notes (Signed)
[address]       [place] [address]             [phone]        Saviour Mathias [id number] [birth year]  History of Present Illness:  Robert [name] is a 52 year old gentleman with a history of type 2 diabetes, dyslipidemia, and hypertension who was diagnosed with rectal cancer requiring surgery.  Preoperative coronary CT scan and echocardiogram showed a 5.2 cm aortic root and 5 cm ascending aorta with moderate aortic insufficiency and an LV diastolic diameter of 6.2 cm.  He subsequently underwent low anterior resection with an ileostomy by Dr. Cliffton Asters on 08/23/2022.  This was Stage pT3 pN0, IIA.  He underwent postoperative chemotherapy which has been completed. Follow up Echocardiogram performed in June of 2024  Showing a trileaflet aortic valve with moderate aortic insufficiency with a pressure half-time of 604 ms. The aortic root was measured at 4.8 cm with the ascending aorta measured at 4.9 cm. LV diastolic dimension was 6 cm and unchanged. CTA of the chest on 01/11/2023 showed the sinus of Valsalva dimensions to be 47 mm, 49 mm, and 46 mm. The sinotubular junction was 50 mm and the ascending aorta 4.6 cm. Aortic arch was 3.0 cm. The descending aorta at the level of the carina was 2.4 cm.  He has been followed up Dr. Laneta Simmers who felt the patient is currently below surgical threshold, however he will likely require surgery in the future.  It was felt he should be completed with treatment for his rectal cancer, have ileostomy reversed, and ensure no recurrence prior to proceeding.    The patient had a lot of difficulty with ileostomy post operatively.  Mainly due to high output and subsequent dehydration.  He was able to undergo reversal by Dr. Cliffton Asters in August of this year.  He presents today for 6 month follow up with repeat CTA.  Overall the patient is making progress.  He states he continues to have issues with his bowels.  He states its a fine balance between diarrhea and  constipation.  He states that when he gets constipated he notices he has increased pressure in his chest.  He also experiences some shortness of breath with heavy exertion or bending over at work.  He finally states that his blood pressure has been running high.  He used to take additional blood pressure medications that were stopped during his cancer treatment mainly due to issues with his kidneys at that time.  He denies lower extremity edema.  Current Outpatient Medications on File Prior to Visit  Medication Sig Dispense Refill   amLODipine (NORVASC) 5 MG tablet TAKE 1 TABLET (5 MG TOTAL) BY MOUTH DAILY. (Patient taking differently: Take 5 mg by mouth daily as needed (blood pressure above 140).) 90 tablet 1   carvedilol (COREG) 25 MG tablet Take 1 tablet (25 mg total) by mouth 2 (two) times daily. 180 tablet 3   famotidine (PEPCID) 40 MG tablet Take 1 tablet (40 mg total) by mouth at bedtime. (Patient taking differently: Take 40 mg by mouth at bedtime as needed for heartburn or indigestion.) 30 tablet 3   Ferric Maltol (ACCRUFER) 30 MG CAPS Take 1 capsule (30 mg total) by mouth daily. 30 capsule 2   gabapentin (NEURONTIN) 100 MG capsule Take 1 capsule (100 mg total) by mouth 2 (two) times daily. TAKE 1 IN MORNING AND 3 AT NIGHT 120 capsule 2   glucose blood (COOL BLOOD  GLUCOSE TEST STRIPS) test strip Check blood sugar once daily 100 each 12   loperamide (IMODIUM) 2 MG capsule Take 2 mg by mouth as needed for diarrhea or loose stools.     metFORMIN (GLUCOPHAGE) 500 MG tablet TAKE 1 TABLET BY MOUTH TWICE A DAY (Patient taking differently: Take 500 mg by mouth daily.) 180 tablet 1   pantoprazole (PROTONIX) 40 MG tablet TAKE 1 TABLET BY MOUTH EVERY DAY *ONLY COVERS 90 DAYS 90 tablet 1   sitaGLIPtin (JANUVIA) 100 MG tablet Take 1 tablet (100 mg total) by mouth daily. 90 tablet 1   tiZANidine (ZANAFLEX) 2 MG tablet Take 1-2 tablets (2-4 mg total) by mouth 3 (three) times daily as needed for muscle spasms.  60 tablet 2   No current facility-administered medications on file prior to visit.    Physical Exam  Gen: NAD Heart: RRR Lungs: CTA bilaterally Neck: no carotid bruit Ext; no edema Neuro: grossly normal  CTA Results:  FINDINGS: Cardiovascular: Normal heart size. No pericardial effusion. Severe coronary artery calcifications. Dilated aortic root measuring up 5.1 cm and dilated ascending thoracic aorta measuring up to 4.8 cm, unchanged. Normal caliber aortic arch and descending thoracic aorta.   Mediastinum/Nodes: Esophagus and thyroid are unremarkable. No enlarged lymph nodes seen in the chest.   Lungs/Pleura: Central airways are patent. Stable right lower lobe consolidation, previously evaluated with PET-CT and demonstrated low-level nonspecific activity. Stable ground-glass nodule of the left upper lobe measuring 15 x 14 mm on series 11, image 59.   Upper Abdomen: No acute abnormality.   Musculoskeletal: No chest wall abnormality. No acute or significant osseous findings.   Review of the MIP images confirms the above findings.   IMPRESSION: 1. Stable dilated aortic root measuring up to 5.1 cm and dilated ascending thoracic aorta measuring up to 4.8 cm. Ascending thoracic aortic aneurysm. Recommend semi-annual imaging followup by CTA or MRA and referral to cardiothoracic surgery if not already obtained. This recommendation follows 2010 ACCF/AHA/AATS/ACR/ASA/SCA/SCAI/SIR/STS/SVM Guidelines for the Diagnosis and Management of Patients With Thoracic Aortic Disease. Circulation. 2010; 121: Z610-R604. Aortic aneurysm NOS (ICD10-I71.9) 2. Stable right lower lobe consolidation, previously evaluated with PET-CT and demonstrated low-level nonspecific activity. Recommend attention on follow-up as recommended above. 3. Stable ground-glass nodule of the left upper lobe. Recommend attention on follow-up. 4. Coronary artery calcifications and aortic  Atherosclerosis (ICD10-I70.0).     Electronically Signed   By: Allegra Lai M.D.   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 Glean Hess, who has ordered repeat Echocardiogram for January 2025 H/O Rectal Cancer- has completed therapy, Ileostomy has been reversed.. Most recent PET CT and Oncology follow up from (11/5) show no evidence of recurrence HTN DM CAD   I have discussed patient's case with Dr. Laneta Simmers.  CTA shows stable appearance of Aortic Aneurysm from previous scan in May.  He continues to remain below surgical threshold however will require surgery in the future.  Echocardiogram to assess Aortic Valve function to be performed in January.  Plan will be to obtain Gated CT Chest/Aorta to fully assess aortic valve and root.  We will plan to get this done in February with follow up with Dr. Laneta Simmers for presurgical planning and discuss timing of surgery at next visit.  Patient was counseled on importance of Blood Pressure Control.  Despite Medical intervention if the patient notices persistently elevated blood pressure readings.  They are instructed to  contact their Primary Care Physician  Please avoid use of Fluoroquinolones as this can potentially increase your risk of Aortic Rupture and/or Dissection  Patient educated on signs and symptoms of Aortic Dissection, handout also provided in AVS  Emersen Carroll, PA-C 06/20/23

## 2023-06-22 NOTE — Telephone Encounter (Signed)
Patient advised to pick up ferrous sulfate

## 2023-06-23 ENCOUNTER — Other Ambulatory Visit: Payer: Self-pay | Admitting: Internal Medicine

## 2023-06-24 ENCOUNTER — Ambulatory Visit (INDEPENDENT_AMBULATORY_CARE_PROVIDER_SITE_OTHER): Payer: BLUE CROSS/BLUE SHIELD

## 2023-06-24 VITALS — BP 162/92 | HR 69 | Resp 18 | Ht 75.0 in | Wt 255.0 lb

## 2023-06-24 DIAGNOSIS — I7121 Aneurysm of the ascending aorta, without rupture: Secondary | ICD-10-CM | POA: Diagnosis not present

## 2023-06-24 MED ORDER — AMLODIPINE BESYLATE 10 MG PO TABS: 10.0000 mg | ORAL_TABLET | Freq: Every day | ORAL | 3 refills | Status: DC

## 2023-06-24 NOTE — Patient Instructions (Signed)
Make every effort to maintain a "heart-healthy" lifestyle with regular physical exercise and adherence to a low-fat, low-carbohydrate diet.  Continue to seek regular follow-up appointments with your primary care physician and/or cardiologist.    

## 2023-07-06 ENCOUNTER — Other Ambulatory Visit: Payer: Self-pay

## 2023-07-09 ENCOUNTER — Encounter: Payer: Self-pay | Admitting: Internal Medicine

## 2023-07-09 ENCOUNTER — Ambulatory Visit (INDEPENDENT_AMBULATORY_CARE_PROVIDER_SITE_OTHER): Payer: BLUE CROSS/BLUE SHIELD | Admitting: Internal Medicine

## 2023-07-09 VITALS — BP 124/82 | HR 70 | Ht 75.0 in | Wt 258.0 lb

## 2023-07-09 DIAGNOSIS — E1142 Type 2 diabetes mellitus with diabetic polyneuropathy: Secondary | ICD-10-CM | POA: Insufficient documentation

## 2023-07-09 DIAGNOSIS — Z7984 Long term (current) use of oral hypoglycemic drugs: Secondary | ICD-10-CM

## 2023-07-09 DIAGNOSIS — E119 Type 2 diabetes mellitus without complications: Secondary | ICD-10-CM | POA: Insufficient documentation

## 2023-07-09 DIAGNOSIS — E1159 Type 2 diabetes mellitus with other circulatory complications: Secondary | ICD-10-CM

## 2023-07-09 LAB — POCT GLYCOSYLATED HEMOGLOBIN (HGB A1C): Hemoglobin A1C: 6.1 % — AB (ref 4.0–5.6)

## 2023-07-09 LAB — POCT GLUCOSE (DEVICE FOR HOME USE): Glucose Fasting, POC: 164 mg/dL — AB (ref 70–99)

## 2023-07-09 MED ORDER — SITAGLIPTIN PHOSPHATE 100 MG PO TABS: 100.0000 mg | ORAL_TABLET | Freq: Every day | ORAL | 3 refills | Status: DC

## 2023-07-09 MED ORDER — EMPAGLIFLOZIN 10 MG PO TABS: 10.0000 mg | ORAL_TABLET | Freq: Every day | ORAL | 3 refills | Status: AC

## 2023-07-09 NOTE — Patient Instructions (Addendum)
Stop metformin Start Jardiance 10 mg, 1 tablet daily Continue Januvia 100 mg daily     HOW TO TREAT LOW BLOOD SUGARS (Blood sugar LESS THAN 70 MG/DL) Please follow the RULE OF 15 for the treatment of hypoglycemia treatment (when your (blood sugars are less than 70 mg/dL)   STEP 1: Take 15 grams of carbohydrates when your blood sugar is low, which includes:  3-4 GLUCOSE TABS  OR 3-4 OZ OF JUICE OR REGULAR SODA OR ONE TUBE OF GLUCOSE GEL    STEP 2: RECHECK blood sugar in 15 MINUTES STEP 3: If your blood sugar is still low at the 15 minute recheck --> then, go back to STEP 1 and treat AGAIN with another 15 grams of carbohydrates.

## 2023-07-09 NOTE — Progress Notes (Signed)
Name: Robert [name]  MRN/ DOB: [id number], [birth year]   Age/ Sex: 52 y.o., male    PCP: Bradd Canary, MD   Reason for Endocrinology Evaluation: Type 2 Diabetes Mellitus     Date of Initial Endocrinology Visit: 07/09/2023     PATIENT IDENTIFIER: [name] is a 52 y.o. male with a past medical history of DM, HTN, CAD, Hx rectal cancer. The patient presented for initial endocrinology clinic visit on 07/09/2023 for consultative assistance with his diabetes management.    HPI: Mr. Laskoski was    Diagnosed with DM 2015 Prior Medications tried/Intolerance: Metformin and Januvia Currently checking blood sugars occasionally  Hypoglycemia episodes : no              Hemoglobin A1c has ranged from 6.4% in 2024, peaking at 8.1% in 2023.  In terms of diet, the patient eats 2 meals, snacks occasionally. Drinks occasional sugar-sweetened beverages    Patient was diagnosed with rectal cancer 04/2022, with invasive moderately differentiated adenocarcinoma, he is s/p robotic assisted low anterior resection with diverting loop ileostomy 08/2022, s/p adjuvant chemo.Marland Kitchen  He is on Imodium Ileostomy takedown 03/2023 Follows with Dr. 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, ascending thoracic aortic aneurysm  He has neuropathic symptoms of both hands and feet, on gabapentin through neurology  Continues to use Imodium occasionally for diarrhea  Has occasional nausea but no vomiting  Has GERD symptoms  No UTI's  Has chronic neuropathic symptoms of fingers and feet  NO recent blood transfusions  No recent iron intake    HOME DIABETES REGIMEN: Metformin 500 mg daily Januvia 100 mg daily    Statin: no ACE-I/ARB: no   METER DOWNLOAD SUMMARY: n/a  DIABETIC COMPLICATIONS: Microvascular complications:  Neuropathy Denies: CKD Last eye exam: Completed 2023  Macrovascular complications:  CAD Denies: PVD, CVA   PAST  HISTORY: Past Medical History:  Past Medical History:  Diagnosis Date   Anemia    Arthralgia 01/11/2013   Diffuse, b/l Hips R>L Knees, ankles, low back   Arthritis    Back pain 12/08/2015   Cancer (HCC)    Coronary artery disease    Diabetes mellitus type 2 in obese 11/09/2012   Dyslipidemia 11/09/2012   Esophageal reflux 12/08/2015   Feeling grief 06/09/2020   History of migraine headaches    Hyperglycemia 11/09/2012   Hypertension    Neuropathy    hands and feet   Past Surgical History:  Past Surgical History:  Procedure Laterality Date   DIVERTING ILEOSTOMY N/A 08/23/2022   Procedure: DIVERTING LOOP ILEOSTOMY;  Surgeon: Andria Meuse, MD;  Location: [facility];  Service: General;  Laterality: N/A;   EUS N/A 06/21/2022   Procedure: LOWER ENDOSCOPIC ULTRASOUND (EUS);  Surgeon: Lemar Lofty., MD;  Location: [facility];  Service: Gastroenterology;  Laterality: N/A;   FLEXIBLE SIGMOIDOSCOPY N/A 06/21/2022   Procedure: FLEXIBLE SIGMOIDOSCOPY;  Surgeon: Meridee Score Netty Starring., MD;  Location: [facility];  Service: Gastroenterology;  Laterality: N/A;   FLEXIBLE SIGMOIDOSCOPY N/A 08/23/2022   Procedure: FLEXIBLE SIGMOIDOSCOPY;  Surgeon: Andria Meuse, MD;  Location: [facility];  Service: General;  Laterality: N/A;   FLEXIBLE SIGMOIDOSCOPY N/A 03/20/2023   Procedure: DIAGNOSTIC FLEXIBLE SIGMOIDOSCOPY;  Surgeon: Andria Meuse, MD;  Location: [facility];  Service: General;  Laterality: N/A;   ILEOSTOMY CLOSURE N/A 03/20/2023   Procedure: TAKEDOWN OF DIVERTING LOOP ILEOSTOMY;  Surgeon: Andria Meuse, MD;  Location:  [facility];  Service: General;  Laterality: N/A;   PORT-A-CATH REMOVAL N/A 05/03/2023   Procedure: REMOVAL PORT-A-CATH;  Surgeon: Andria Meuse, MD;  Location: [facility];  Service: General;  Laterality: N/A;   PORTACATH PLACEMENT N/A 09/21/2022   Procedure: INSERTION PORT-A-CATH WITH ULTRASOUND GUIDANCE;  Surgeon: Andria Meuse, MD;  Location: [facility];  Service: General;  Laterality: N/A;   XI ROBOTIC ASSISTED LOWER ANTERIOR RESECTION N/A 08/23/2022   Procedure: XI ROBOTIC ASSISTED LOWER ANTERIOR RESECTION with Introperative assessment of profusion and TAP Block;  Surgeon: Andria Meuse, MD;  Location: [facility];  Service: General;  Laterality: N/A;    Social History:  reports that he quit smoking about 27 years ago. His smoking use included cigarettes. He started smoking about 37 years ago. He has a 15 pack-year smoking history. He has never used smokeless tobacco. He reports current alcohol use of about 5.0 standard drinks of alcohol per week. He reports that he does not currently use drugs after having used the following drugs: Marijuana. Family History:  Family History  Problem Relation Age of Onset   Arthritis Mother    Hypertension Mother    Diabetes Mother    Cancer Mother    Arthritis Father    Hypertension Father    Diabetes Father    Sleep apnea Father    Obesity Father    Barrett's esophagus Father    Hyperlipidemia Brother    Hypertension Brother    Diabetes Brother    Arthritis Maternal Grandmother    Diabetes Maternal Grandmother    Stroke Maternal Grandmother    COPD Maternal Grandfather    Arthritis Paternal Grandmother    Diabetes Paternal Grandmother    Cancer Paternal Grandmother        lung, smoker   Diabetes Paternal Grandfather    Heart disease Paternal Grandfather 72       MI   Kidney disease Neg Hx      HOME MEDICATIONS: Allergies as of 07/09/2023       Reactions   Advil [ibuprofen] Shortness Of Breath   Aleve [naproxen] Shortness Of Breath   Mucinex [guaifenesin Er] Shortness Of Breath, Itching, Swelling, Dermatitis, Rash, Other (See Comments)   Swelling of hands and feet   Nsaids Shortness Of Breath, Itching, Swelling, Rash, Other (See Comments)   Aleve and Advil    Levaquin [levofloxacin]    Aortic aneurysm; not recommend in this condition unless no other agents would cover   Latex  Rash, Other (See Comments)   "Cartilage will harden"        Medication List        Accurate as of July 09, 2023  8:50 AM. If you have any questions, ask your nurse or doctor.          amLODipine 10 MG tablet Commonly known as: Norvasc Take 1 tablet (10 mg total) by mouth daily.   carvedilol 25 MG tablet Commonly known as: COREG TAKE 1 TABLET BY MOUTH TWICE A DAY   Cool Blood Glucose Test Strips test strip Generic drug: glucose blood Check blood sugar once daily   famotidine 40 MG tablet Commonly known as: PEPCID Take 1 tablet (40 mg total) by mouth at bedtime. What changed:  when to take this reasons to take this   gabapentin 100 MG capsule Commonly known as: NEURONTIN Take 1 capsule (100 mg total) by mouth 2 (two) times daily. TAKE 1 IN MORNING AND 3 AT NIGHT   loperamide 2  MG capsule Commonly known as: IMODIUM Take 2 mg by mouth as needed for diarrhea or loose stools.   metFORMIN 500 MG tablet Commonly known as: GLUCOPHAGE TAKE 1 TABLET BY MOUTH TWICE A DAY What changed: when to take this   pantoprazole 40 MG tablet Commonly known as: PROTONIX TAKE 1 TABLET BY MOUTH EVERY DAY *ONLY COVERS 90 DAYS   sitaGLIPtin 100 MG tablet Commonly known as: Januvia Take 1 tablet (100 mg total) by mouth daily.   tiZANidine 2 MG tablet Commonly known as: ZANAFLEX Take 1-2 tablets (2-4 mg total) by mouth 3 (three) times daily as needed for muscle spasms.         ALLERGIES: Allergies  Allergen Reactions   Advil [Ibuprofen] Shortness Of Breath   Aleve [Naproxen] Shortness Of Breath   Mucinex [Guaifenesin Er] Shortness Of Breath, Itching, Swelling, Dermatitis, Rash and Other (See Comments)    Swelling of hands and feet   Nsaids Shortness Of Breath, Itching, Swelling, Rash and Other (See Comments)    Aleve and Advil    Levaquin [Levofloxacin]     Aortic aneurysm; not recommend in this condition unless no other agents would cover   Latex Rash and Other (See  Comments)    "Cartilage will harden"     REVIEW OF SYSTEMS: A comprehensive ROS was conducted with the patient and is negative except as per HPI     OBJECTIVE:   VITAL SIGNS: BP 124/82 (BP Location: Left Arm, Patient Position: Sitting, Cuff Size: Large)   Pulse 70   Ht 6\' 3"  (1.905 m)   Wt 258 lb (117 kg)   SpO2 96%   BMI 32.25 kg/m    PHYSICAL EXAM:  General: Pt appears well and is in NAD  Neck: General: Supple without adenopathy or carotid bruits. Thyroid: Thyroid size normal.  No goiter or nodules appreciated.   Lungs: Clear with good BS bilat   Heart: RRR   Abdomen:  soft, nontender  Extremities:  Lower extremities - No pretibial edema.   Neuro: MS is good with appropriate affect, pt is alert and Ox3    DM foot exam: 07/09/2023  The skin of the feet is intact without sores or ulcerations. The pedal pulses are 2+ on right and 2+ on left. The sensation is decreased  to a screening 5.07, 10 gram monofilament on the left    DATA REVIEWED:  Lab Results  Component Value Date   HGBA1C 6.4 (H) 03/12/2023   HGBA1C 6.6 (H) 02/05/2023   HGBA1C 7.2 (H) 09/17/2022    Latest Reference Range & Units 06/18/23 09:40  Sodium 135 - 145 mmol/L 140  Potassium 3.5 - 5.1 mmol/L 4.0  Chloride 98 - 111 mmol/L 106  CO2 22 - 32 mmol/L 28  Glucose 70 - 99 mg/dL 010 (H)  BUN 6 - 20 mg/dL 16  Creatinine 2.72 - 5.36 mg/dL 6.44 (H)  Calcium 8.9 - 10.3 mg/dL 9.3  Anion gap 5 - 15  6  Alkaline Phosphatase 38 - 126 U/L 56  Albumin 3.5 - 5.0 g/dL 4.1  AST 15 - 41 U/L 16  ALT 0 - 44 U/L 14  Total Protein 6.5 - 8.1 g/dL 6.8  Total Bilirubin <0.3 mg/dL 0.6  GFR, Est Non African American >60 mL/min >60   In office BG 164 mg/dL     ASSESSMENT / PLAN / RECOMMENDATIONS:   1) Type 2 Diabetes Mellitus, optimally controlled, With neuropathic and macrovascular complications - Most recent A1c of 6.1 %.  Goal A1c < 7.0 %.    -A1c has been optimal -Discussed the importance of optimizing  glucose control to prevent microvascular complications -Patient with history of rectal cancer, with chronic explosive diarrhea and GI symptoms, I have recommended discontinuing metformin, patient is not a candidate for GLP-1 agonist -I did recommend SGLT2 inhibitors, caution against genital infections, encourage hydration, discussed cardiovascular renal benefits -If SGLT 2 inhibitors are cost prohibitive we will consider sulfonylurea   MEDICATIONS: Stop metformin Start Jardiance 10 mg daily Continue Januvia 100 mg daily  EDUCATION / INSTRUCTIONS: BG monitoring instructions: Patient is instructed to check his blood sugars 1 times a day. Call [facility] if: BG persistently < 70  I reviewed the Rule of 15 for the treatment of hypoglycemia in detail with the patient. Literature supplied.   2) Diabetic complications:  Eye: Does not have known diabetic retinopathy.  Neuro/ Feet: Does  have known diabetic peripheral neuropathy. Renal: Patient does not have known baseline CKD. He is not on an ACEI/ARB at present.   Follow-up in 3 months   Signed electronically by: Lyndle Herrlich, MD  [facility] End[facility]  [facility] [address] [place], [place] [address] Phone: [phone] FAX: [phone]   CC: Bradd Canary, MD [address] [place] [address] Phone: [phone]  Fax: [phone]    Return to Endocrinology clinic as below: Future Appointments  Date Time Provider Department Center  07/16/2023  1:20 PM Bradd Canary, MD [id number]  08/19/2023  9:20 AM [facility] 2 [id number]  09/18/2023 10:30 AM [facility] None  09/18/2023 11:00 AM Malachy Mood, MD [facility] None

## 2023-07-10 ENCOUNTER — Other Ambulatory Visit: Payer: Self-pay

## 2023-07-14 DIAGNOSIS — R748 Abnormal levels of other serum enzymes: Secondary | ICD-10-CM | POA: Insufficient documentation

## 2023-07-14 NOTE — Assessment & Plan Note (Signed)
Following with endocrinology, tolerating [place]

## 2023-07-14 NOTE — Assessment & Plan Note (Signed)
Cardiology has recommended a LDL goal of <70 and patient is agreeable but they have recommended we institute therapy after his cancer surgery so we will hold off on adding Atorvastatin 10 mg tabs, 1 daily until after surgery. Maintain heart healthy diet such as Mediterranean, MIND or DASH diet, increase exercise, avoid trans fats, simple carbohydrates and processed foods, consider a krill or fish or flaxseed oil cap daily.   

## 2023-07-14 NOTE — Assessment & Plan Note (Signed)
Will monitor

## 2023-07-14 NOTE — Assessment & Plan Note (Signed)
Following with oncology and surgery

## 2023-07-14 NOTE — Assessment & Plan Note (Signed)
Well controlled, no changes to meds. Encouraged heart healthy diet such as the DASH diet and exercise as tolerated.  °

## 2023-07-14 NOTE — Assessment & Plan Note (Signed)
Patient encouraged to maintain heart healthy diet, regular exercise, adequate sleep. Consider daily probiotics. Take medications as prescribed. Labs ordered and reviewed. Given and reviewed copy of ACP documents from [place] Secretary of State and encouraged to complete and return 

## 2023-07-16 ENCOUNTER — Ambulatory Visit (INDEPENDENT_AMBULATORY_CARE_PROVIDER_SITE_OTHER): Payer: BLUE CROSS/BLUE SHIELD | Admitting: Family Medicine

## 2023-07-16 VITALS — BP 138/80 | HR 68 | Temp 98.3°F | Resp 16 | Ht 75.0 in | Wt 259.6 lb

## 2023-07-16 DIAGNOSIS — R748 Abnormal levels of other serum enzymes: Secondary | ICD-10-CM

## 2023-07-16 DIAGNOSIS — D649 Anemia, unspecified: Secondary | ICD-10-CM

## 2023-07-16 DIAGNOSIS — E1129 Type 2 diabetes mellitus with other diabetic kidney complication: Secondary | ICD-10-CM

## 2023-07-16 DIAGNOSIS — I10 Essential (primary) hypertension: Secondary | ICD-10-CM | POA: Diagnosis not present

## 2023-07-16 DIAGNOSIS — Z7984 Long term (current) use of oral hypoglycemic drugs: Secondary | ICD-10-CM

## 2023-07-16 DIAGNOSIS — R351 Nocturia: Secondary | ICD-10-CM

## 2023-07-16 DIAGNOSIS — Z0000 Encounter for general adult medical examination without abnormal findings: Secondary | ICD-10-CM

## 2023-07-16 DIAGNOSIS — E785 Hyperlipidemia, unspecified: Secondary | ICD-10-CM

## 2023-07-16 DIAGNOSIS — C20 Malignant neoplasm of rectum: Secondary | ICD-10-CM

## 2023-07-16 NOTE — Patient Instructions (Addendum)
Tetanus is due Annual covid and flu boosters  RSV, Respiratory Syncitial Virus Vaccine, Arexvy vaccine  Shingrix is the new shingles shot, 2 shots over 2-6 months, confirm coverage with insurance and document, then can return here for shots with nurse appt or at pharmacy   Netflix: Live to [employer] [facility]     Preventive Care 67-54 Years Old, Male Preventive care refers to lifestyle choices and visits with your health care provider that can promote health and wellness. Preventive care visits are also called wellness exams. What can I expect for my preventive care visit? Counseling During your preventive care visit, your health care provider may ask about your: Medical history, including: Past medical problems. Family medical history. Current health, including: Emotional well-being. Home life and relationship well-being. Sexual activity. Lifestyle, including: Alcohol, nicotine or tobacco, and drug use. Access to firearms. Diet, exercise, and sleep habits. Safety issues such as seatbelt and bike helmet use. Sunscreen use. Work and work Astronomer. Physical exam Your health care provider will check your: Height and weight. These may be used to calculate your BMI (body mass index). BMI is a measurement that tells if you are at a healthy weight. Waist circumference. This measures the distance around your waistline. This measurement also tells if you are at a healthy weight and may help predict your risk of certain diseases, such as type 2 diabetes and high blood pressure. Heart rate and blood pressure. Body temperature. Skin for abnormal spots. What immunizations do I need?  Vaccines are usually given at various ages, according to a schedule. Your health care provider will recommend vaccines for you based on your age, medical history, and lifestyle or other factors, such as travel or where you work. What tests do I need? Screening Your health care provider may  recommend screening tests for certain conditions. This may include: Lipid and cholesterol levels. Diabetes screening. This is done by checking your blood sugar (glucose) after you have not eaten for a while (fasting). Hepatitis B test. Hepatitis C test. HIV (human immunodeficiency virus) test. STI (sexually transmitted infection) testing, if you are at risk. Lung cancer screening. Prostate cancer screening. Colorectal cancer screening. Talk with your health care provider about your test results, treatment options, and if necessary, the need for more tests. Follow these instructions at home: Eating and drinking  Eat a diet that includes fresh fruits and vegetables, whole grains, lean protein, and low-fat dairy products. Take vitamin and mineral supplements as recommended by your health care provider. Do not drink alcohol if your health care provider tells you not to drink. If you drink alcohol: Limit how much you have to 0-2 drinks a day. Know how much alcohol is in your drink. In the [place] one drink equals one 12 oz bottle of beer (355 mL), one 5 oz glass of wine (148 mL), or one 1 oz glass of hard liquor (44 mL). Lifestyle Brush your teeth every morning and night with fluoride toothpaste. Floss one time each day. Exercise for at least 30 minutes 5 or more days each week. Do not use any products that contain nicotine or tobacco. These products include cigarettes, chewing tobacco, and vaping devices, such as e-cigarettes. If you need help quitting, ask your health care provider. Do not use drugs. If you are sexually active, practice safe sex. Use a condom or other form of protection to prevent STIs. Take aspirin only as told by your health care provider. Make sure that you understand how  much to take and what form to take. Work with your health care provider to find out whether it is safe and beneficial for you to take aspirin daily. Find healthy ways to manage stress, such  as: Meditation, yoga, or listening to music. Journaling. Talking to a trusted person. Spending time with friends and family. Minimize exposure to UV radiation to reduce your risk of skin cancer. Safety Always wear your seat belt while driving or riding in a vehicle. Do not drive: If you have been drinking alcohol. Do not ride with someone who has been drinking. When you are tired or distracted. While texting. If you have been using any mind-altering substances or drugs. Wear a helmet and other protective equipment during sports activities. If you have firearms in your house, make sure you follow all gun safety procedures. What's next? Go to your health care provider once a year for an annual wellness visit. Ask your health care provider how often you should have your eyes and teeth checked. Stay up to date on all vaccines. This information is not intended to replace advice given to you by your health care provider. Make sure you discuss any questions you have with your health care provider. Document Revised: 01/25/2021 Document Reviewed: 01/25/2021 Elsevier Patient Education  2024 [employer].

## 2023-07-17 ENCOUNTER — Encounter: Payer: Self-pay | Admitting: Hematology

## 2023-07-17 ENCOUNTER — Encounter: Payer: Self-pay | Admitting: Family Medicine

## 2023-07-17 ENCOUNTER — Other Ambulatory Visit: Payer: Self-pay

## 2023-07-17 LAB — CBC WITH DIFFERENTIAL/PLATELET
Basophils Absolute: 0.1 10*3/uL (ref 0.0–0.1)
Basophils Relative: 1 % (ref 0.0–3.0)
Eosinophils Absolute: 0.5 10*3/uL (ref 0.0–0.7)
Eosinophils Relative: 6.2 % — ABNORMAL HIGH (ref 0.0–5.0)
HCT: 36.5 % — ABNORMAL LOW (ref 39.0–52.0)
Hemoglobin: 11.1 g/dL — ABNORMAL LOW (ref 13.0–17.0)
Lymphocytes Relative: 22.4 % (ref 12.0–46.0)
Lymphs Abs: 1.8 10*3/uL (ref 0.7–4.0)
MCHC: 30.3 g/dL (ref 30.0–36.0)
MCV: 71.7 fL — ABNORMAL LOW (ref 78.0–100.0)
Monocytes Absolute: 0.7 10*3/uL (ref 0.1–1.0)
Monocytes Relative: 8.3 % (ref 3.0–12.0)
Neutro Abs: 5 10*3/uL (ref 1.4–7.7)
Neutrophils Relative %: 62.1 % (ref 43.0–77.0)
Platelets: 248 10*3/uL (ref 150.0–400.0)
RBC: 5.09 Mil/uL (ref 4.22–5.81)
RDW: 19.3 % — ABNORMAL HIGH (ref 11.5–15.5)
WBC: 8 10*3/uL (ref 4.0–10.5)

## 2023-07-17 LAB — PSA: PSA: 0.55 ng/mL (ref 0.10–4.00)

## 2023-07-17 LAB — COMPREHENSIVE METABOLIC PANEL
ALT: 17 U/L (ref 0–53)
AST: 19 U/L (ref 0–37)
Albumin: 4.4 g/dL (ref 3.5–5.2)
Alkaline Phosphatase: 72 U/L (ref 39–117)
BUN: 12 mg/dL (ref 6–23)
CO2: 31 meq/L (ref 19–32)
Calcium: 9.6 mg/dL (ref 8.4–10.5)
Chloride: 104 meq/L (ref 96–112)
Creatinine, Ser: 1.19 mg/dL (ref 0.40–1.50)
GFR: 69.31 mL/min (ref >60.00)
Glucose, Bld: 101 mg/dL — ABNORMAL HIGH (ref 70–99)
Potassium: 4 meq/L (ref 3.5–5.1)
Sodium: 141 meq/L (ref 135–145)
Total Bilirubin: 0.7 mg/dL (ref 0.2–1.2)
Total Protein: 7.1 g/dL (ref 6.0–8.3)

## 2023-07-17 LAB — LIPID PANEL
Cholesterol: 152 mg/dL (ref 0–200)
HDL: 40 mg/dL (ref >39.00)
LDL Cholesterol: 95 mg/dL (ref 0–99)
NonHDL: 112.1
Total CHOL/HDL Ratio: 4
Triglycerides: 88 mg/dL (ref 0.0–149.0)
VLDL: 17.6 mg/dL (ref 0.0–40.0)

## 2023-07-17 LAB — TSH: TSH: 0.3 u[IU]/mL — ABNORMAL LOW (ref 0.35–5.50)

## 2023-07-17 LAB — LIPASE: Lipase: 49 U/L (ref 11.0–59.0)

## 2023-07-17 NOTE — Progress Notes (Signed)
Subjective:    Patient ID: Robert [name], male    DOB: [birth year], 52 y.o.   MRN: [id number]  Chief Complaint  Patient presents with  . Annual Exam    HPI Discussed the use of AI scribe software for clinical note transcription with the patient, who gave verbal consent to proceed.  History of Present Illness   The patient, with a history of recent surgery, presents with ongoing recovery and gastrointestinal issues. They report that the frequency of bad days has decreased, with an interval of four days between such instances. The patient manages these symptoms with Imodium, which they take once to calm everything down. They report that their colitis has mostly subsided, but they still experience bloating and discomfort, particularly after consuming certain foods such as raw fruit and nuts. he does feel his GI symptoms have improved with stopping of his Metformin by Endocrinology.   The patient also reports neuropathy, primarily in the index finger and thumb tips of their hands, and more significantly in their feet. The symptoms are managed with gabapentin, which they take mostly at night when the symptoms are more bothersome.  In addition to these issues, the patient has a history of heart conditions. They also mention a potential bicuspid valve issue that may be causing some of the heart issues.  The patient also has a history of diabetes and has recently switched from metformin to Jardiance due to gastrointestinal issues. They report no significant changes in their sugar levels since the switch.  The patient also mentions a ground glass nodule in the lung that has been stable and is being monitored with regular CT scans. They deny any new symptoms such as shortness of breath or chest pain.        Past Medical History:  Diagnosis Date  . Anemia   . Arthralgia 01/11/2013   Diffuse, b/l Hips R>L Knees, ankles, low back  . Arthritis   . Back pain 12/08/2015  . Cancer (HCC)   . Coronary  artery disease   . Diabetes mellitus type 2 in obese 11/09/2012  . Dyslipidemia 11/09/2012  . Esophageal reflux 12/08/2015  . Feeling grief 06/09/2020  . History of migraine headaches   . Hyperglycemia 11/09/2012  . Hypertension   . Neuropathy    hands and feet    Past Surgical History:  Procedure Laterality Date  . DIVERTING ILEOSTOMY N/A 08/23/2022   Procedure: DIVERTING LOOP ILEOSTOMY;  Surgeon: Andria Meuse, MD;  Location: [facility];  Service: General;  Laterality: N/A;  . EUS N/A 06/21/2022   Procedure: LOWER ENDOSCOPIC ULTRASOUND (EUS);  Surgeon: Lemar Lofty., MD;  Location: [facility];  Service: Gastroenterology;  Laterality: N/A;  . FLEXIBLE SIGMOIDOSCOPY N/A 06/21/2022   Procedure: FLEXIBLE SIGMOIDOSCOPY;  Surgeon: Meridee Score Netty Starring., MD;  Location: [facility];  Service: Gastroenterology;  Laterality: N/A;  . FLEXIBLE SIGMOIDOSCOPY N/A 08/23/2022   Procedure: FLEXIBLE SIGMOIDOSCOPY;  Surgeon: Andria Meuse, MD;  Location: [facility];  Service: General;  Laterality: N/A;  . FLEXIBLE SIGMOIDOSCOPY N/A 03/20/2023   Procedure: DIAGNOSTIC FLEXIBLE SIGMOIDOSCOPY;  Surgeon: Andria Meuse, MD;  Location: [facility];  Service: General;  Laterality: N/A;  . ILEOSTOMY CLOSURE N/A 03/20/2023   Procedure: TAKEDOWN OF DIVERTING LOOP ILEOSTOMY;  Surgeon: Andria Meuse, MD;  Location: [facility];  Service: General;  Laterality: N/A;  . PORT-A-CATH REMOVAL N/A 05/03/2023   Procedure: REMOVAL PORT-A-CATH;  Surgeon: Andria Meuse, MD;  Location: [facility];  Service: General;  Laterality: N/A;  .  PORTACATH PLACEMENT N/A 09/21/2022   Procedure: INSERTION PORT-A-CATH WITH ULTRASOUND GUIDANCE;  Surgeon: Andria Meuse, MD;  Location: [facility];  Service: General;  Laterality: N/A;  . XI ROBOTIC ASSISTED LOWER ANTERIOR RESECTION N/A 08/23/2022   Procedure: XI ROBOTIC ASSISTED LOWER ANTERIOR RESECTION with Introperative assessment of profusion and TAP Block;  Surgeon:  Andria Meuse, MD;  Location: [facility];  Service: General;  Laterality: N/A;    Family History  Problem Relation Age of Onset  . Arthritis Mother   . Hypertension Mother   . Diabetes Mother   . Cancer Mother   . Arthritis Father   . Hypertension Father   . Diabetes Father   . Sleep apnea Father   . Obesity Father   . Barrett's esophagus Father   . Hyperlipidemia Brother   . Hypertension Brother   . Diabetes Brother   . Arthritis Maternal Grandmother   . Diabetes Maternal Grandmother   . Stroke Maternal Grandmother   . COPD Maternal Grandfather   . Arthritis Paternal Grandmother   . Diabetes Paternal Grandmother   . Cancer Paternal Grandmother        lung, smoker  . Diabetes Paternal Grandfather   . Heart disease Paternal Grandfather 28       MI  . Kidney disease Neg Hx     Social History   Socioeconomic History  . Marital status: Married    Spouse name: Not on file  . Number of children: 4  . Years of education: Not on file  . Highest education level: Bachelor's degree (e.g., BA, AB, BS)  Occupational History  . Occupation: Community education officer: [employer]  Tobacco Use  . Smoking status: Former    Current packs/day: 0.00    Average packs/day: 1.5 packs/day for 10.0 years (15.0 ttl pk-yrs)    Types: Cigarettes    Start date: 08/13/1985    Quit date: 08/14/1995    Years since quitting: 27.9  . Smokeless tobacco: Never  Vaping Use  . Vaping status: Never Used  Substance and Sexual Activity  . Alcohol use: Yes    Alcohol/week: 5.0 standard drinks of alcohol    Types: 5 Cans of beer per week  . Drug use: Not Currently    Types: Marijuana    Comment: last use 02/2023  . Sexual activity: Yes    Comment: lives with wife and daughter, no dietary restrictions  Other Topics Concern  . 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 Difficulty of Paying Living Expenses: Not very hard  Food Insecurity: No Food Insecurity (03/20/2023)   Hunger Vital Sign   . Worried About Programme researcher, broadcasting/film/video in the Last Year: Never true   . Ran Out of Food in the Last Year: Never true  Transportation Needs: No Transportation Needs (03/20/2023)   PRAPARE - Transportation   . Lack of Transportation (Medical): No   . Lack of Transportation (Non-Medical): No  Physical Activity: Unknown (12/23/2022)   Exercise Vital Sign   .  Days of Exercise per Week: 5 days   . Minutes of Exercise per Session: Patient declined  Stress: No Stress Concern Present (12/23/2022)   [employer] of Occupational Health - Occupational Stress Questionnaire   . Feeling of Stress : Only a little  Social Connections: Moderately Integrated (12/23/2022)   Social Connection and Isolation Panel [NHANES]   . Frequency of Communication with Friends and Family: Three times a week   . Frequency of Social Gatherings with Friends and Family: Patient declined   . Attends Religious Services: More than 4 times per year   . Active Member of Clubs or Organizations: No   . Attends Banker Meetings: Not on file   . Marital Status: Married  Catering manager Violence: Not At Risk (03/20/2023)   Humiliation, Afraid, Rape, and Kick questionnaire   . Fear of Current or Ex-Partner: No   . Emotionally Abused: No   . Physically Abused: No   . Sexually Abused: No    Outpatient Medications Prior to Visit  Medication Sig Dispense Refill  . amLODipine (NORVASC) 10 MG tablet Take 1 tablet (10 mg total) by mouth daily. 30 tablet 3  . carvedilol (COREG) 25 MG tablet TAKE 1 TABLET BY MOUTH TWICE A DAY 180 tablet 3  . empagliflozin (JARDIANCE) 10 MG TABS tablet  Take 1 tablet (10 mg total) by mouth daily before breakfast. 90 tablet 3  . famotidine (PEPCID) 40 MG tablet Take 1 tablet (40 mg total) by mouth at bedtime. (Patient taking differently: Take 40 mg by mouth at bedtime as needed for heartburn or indigestion.) 30 tablet 3  . gabapentin (NEURONTIN) 100 MG capsule Take 1 capsule (100 mg total) by mouth 2 (two) times daily. TAKE 1 IN MORNING AND 3 AT NIGHT 120 capsule 2  . glucose blood (COOL BLOOD GLUCOSE TEST STRIPS) test strip Check blood sugar once daily 100 each 12  . loperamide (IMODIUM) 2 MG capsule Take 2 mg by mouth as needed for diarrhea or loose stools.    . pantoprazole (PROTONIX) 40 MG tablet TAKE 1 TABLET BY MOUTH EVERY DAY *ONLY COVERS 90 DAYS 90 tablet 1  . sitaGLIPtin (JANUVIA) 100 MG tablet Take 1 tablet (100 mg total) by mouth daily. 90 tablet 3  . tiZANidine (ZANAFLEX) 2 MG tablet Take 1-2 tablets (2-4 mg total) by mouth 3 (three) times daily as needed for muscle spasms. 60 tablet 2   No facility-administered medications prior to visit.    Allergies  Allergen Reactions  . Advil [Ibuprofen] Shortness Of Breath  . Aleve [Naproxen] Shortness Of Breath  . Mucinex [Guaifenesin Er] Shortness Of Breath, Itching, Swelling, Dermatitis, Rash and Other (See Comments)    Swelling of hands and feet  . Nsaids Shortness Of Breath, Itching, Swelling, Rash and Other (See Comments)    Aleve and Advil   . Levaquin [Levofloxacin]     Aortic aneurysm; not recommend in this condition unless no other agents would cover  . Latex Rash and Other (See Comments)    "Cartilage will harden"    Review of Systems  Constitutional:  Positive for malaise/fatigue. Negative for chills and fever.  HENT:  Negative for congestion and hearing loss.   Eyes:  Negative for discharge.  Respiratory:  Negative for cough, sputum production and shortness of breath.   Cardiovascular:  Negative for chest pain, palpitations and leg swelling.  Gastrointestinal:  Positive  for abdominal pain. Negative for blood in stool, constipation, diarrhea, heartburn, nausea and vomiting.  Genitourinary:  Negative for dysuria, frequency, hematuria and urgency.  Musculoskeletal:  Negative for back pain, falls and myalgias.  Skin:  Negative for rash.  Neurological:  Negative for dizziness, sensory change, loss of consciousness, weakness and headaches.  Endo/Heme/Allergies:  Negative for environmental allergies. Does not bruise/bleed easily.  Psychiatric/Behavioral:  Negative for depression and suicidal ideas. The patient is not nervous/anxious and does not have insomnia.       Objective:    Physical Exam Vitals reviewed.  Constitutional:      General: He is not in acute distress.    Appearance: Normal appearance. He is not ill-appearing or diaphoretic.  HENT:     Head: Normocephalic and atraumatic.     Right Ear: Tympanic membrane, ear canal and external ear normal. There is no impacted cerumen.     Left Ear: Tympanic membrane, ear canal and external ear normal. There is no impacted cerumen.     Nose: Nose normal. No rhinorrhea.     Mouth/Throat:     Pharynx: Oropharynx is clear.  Eyes:     General: No scleral icterus.    Extraocular Movements: Extraocular movements intact.     Conjunctiva/sclera: Conjunctivae normal.     Pupils: Pupils are equal, round, and reactive to light.  Neck:     Thyroid: No thyroid mass or thyroid tenderness.  Cardiovascular:     Rate and Rhythm: Normal rate and regular rhythm.     Pulses: Normal pulses.     Heart sounds: Normal heart sounds. No murmur heard. Pulmonary:     Effort: Pulmonary effort is normal.     Breath sounds: Normal breath sounds. No wheezing.  Abdominal:     General: Bowel sounds are normal.     Palpations: Abdomen is soft. There is no mass.     Tenderness: There is no guarding.     Comments: Healing incision site from reversal of colostomy site. No surrounding erythema or swelling  Musculoskeletal:         General: No swelling. Normal range of motion.     Cervical back: Normal range of motion and neck supple. No rigidity.     Right lower leg: No edema.     Left lower leg: No edema.  Lymphadenopathy:     Cervical: No cervical adenopathy.  Skin:    General: Skin is warm and dry.     Findings: No rash.  Neurological:     General: No focal deficit present.     Mental Status: He is alert and oriented to person, place, and time.     Cranial Nerves: No cranial nerve deficit.     Deep Tendon Reflexes: Reflexes normal.  Psychiatric:        Mood and Affect: Mood normal.        Behavior: Behavior normal.   BP 138/80 (BP Location: Right Arm, Patient Position: Sitting, Cuff Size: Large)   Pulse 68   Temp 98.3 F (36.8 C) (Oral)   Resp 16   Ht 6\' 3"  (1.905 m)   Wt 259 lb 9.6 oz (117.8 kg)   SpO2 98%   BMI 32.45 kg/m  Wt Readings from Last 3 Encounters:  07/16/23 259 lb 9.6 oz (117.8 kg)  07/09/23 258 lb (117 kg)  06/24/23 255 lb (115.7 kg)    Diabetic Foot Exam - Simple   No data filed    Lab Results  Component Value Date   WBC 8.0 07/16/2023   HGB 11.1 (L) 07/16/2023   HCT 36.5 (L)  07/16/2023   PLT 248.0 07/16/2023   GLUCOSE 101 (H) 07/16/2023   CHOL 152 07/16/2023   TRIG 88.0 07/16/2023   HDL 40.00 07/16/2023   LDLDIRECT 71.0 02/05/2023   LDLCALC 95 07/16/2023   ALT 17 07/16/2023   AST 19 07/16/2023   NA 141 07/16/2023   K 4.0 07/16/2023   CL 104 07/16/2023   CREATININE 1.19 07/16/2023   BUN 12 07/16/2023   CO2 31 07/16/2023   TSH 0.30 (L) 07/16/2023   PSA 0.55 07/16/2023   HGBA1C 6.1 (A) 07/09/2023   MICROALBUR 0.7 02/05/2023    Lab Results  Component Value Date   TSH 0.30 (L) 07/16/2023   Lab Results  Component Value Date   WBC 8.0 07/16/2023   HGB 11.1 (L) 07/16/2023   HCT 36.5 (L) 07/16/2023   MCV 71.7 (L) 07/16/2023   PLT 248.0 07/16/2023   Lab Results  Component Value Date   NA 141 07/16/2023   K 4.0 07/16/2023   CO2 31 07/16/2023   GLUCOSE 101  (H) 07/16/2023   BUN 12 07/16/2023   CREATININE 1.19 07/16/2023   BILITOT 0.7 07/16/2023   ALKPHOS 72 07/16/2023   AST 19 07/16/2023   ALT 17 07/16/2023   PROT 7.1 07/16/2023   ALBUMIN 4.4 07/16/2023   CALCIUM 9.6 07/16/2023   ANIONGAP 6 06/18/2023   GFR 69.31 07/16/2023   Lab Results  Component Value Date   CHOL 152 07/16/2023   Lab Results  Component Value Date   HDL 40.00 07/16/2023   Lab Results  Component Value Date   LDLCALC 95 07/16/2023   Lab Results  Component Value Date   TRIG 88.0 07/16/2023   Lab Results  Component Value Date   CHOLHDL 4 07/16/2023   Lab Results  Component Value Date   HGBA1C 6.1 (A) 07/09/2023       Assessment & Plan:  Type 2 diabetes mellitus with other diabetic kidney complication, without long-term current use of insulin (HCC) Assessment & Plan: Following with endocrinology, tolerating JAnuvia   Primary hypertension Assessment & Plan: Well controlled, no changes to meds. Encouraged heart healthy diet such as the DASH diet and exercise as tolerated.    Orders: -     Comprehensive metabolic panel -     TSH  Elevated lipase Assessment & Plan: Will monitor  Orders: -     Lipase  Hyperlipidemia LDL goal <70 Assessment & Plan: Cardiology has recommended a LDL goal of <70 and patient is agreeable but they have recommended we institute therapy after his cancer surgery so we will hold off on adding Atorvastatin 10 mg tabs, 1 daily until after surgery. Maintain heart healthy diet such as Mediterranean, MIND or DASH diet, increase exercise, avoid trans fats, simple carbohydrates and processed foods, consider a krill or fish or flaxseed oil cap daily.    Orders: -     Lipid panel -     TSH  Preventative health care Assessment & Plan: Patient encouraged to maintain heart healthy diet, regular exercise, adequate sleep. Consider daily probiotics. Take medications as prescribed. Labs ordered and reviewed. Given and reviewed copy  of ACP documents from [facility] Secretary of State and encouraged to complete and return   Rectal cancer [facility]) Assessment & Plan: Following with oncology and surgery   Nocturia -     PSA -     CBC with Differential/Platelet  Anemia, unspecified type -     Iron, TIBC and Ferritin Panel    Assessment and  Plan    Postoperative Recovery from Colitis Improvement in symptoms with occasional bloating and dietary restrictions. -Continue current management with Imodium as needed.  Hypertension Blood pressure creeping up, managed by increasing Amlodipine to 10mg  from 5mg . -Continue Amlodipine 10mg  daily.  Type 2 Diabetes Transition from Metformin to Jardiance due to gastrointestinal side effects from Metformin. -Continue Jardiance daily, monitor for potential side effects such as yeast infections.  Peripheral Neuropathy Stable symptoms in hands and feet, managed with Gabapentin as needed. -Continue Gabapentin 1-3 tablets as needed, particularly at night.  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 -Follow up with regular imaging as per current schedule (alternating echocardiogram and CT).  General Health Maintenance -Annual blood work including thyroid, cholesterol, iron panel, and PSA. -Consider vaccinations including Shingrix for shingles and Arexvy for RSV. -Follow up in six months.         Danise Edge, MD

## 2023-07-19 LAB — IRON,TIBC AND FERRITIN PANEL
%SAT: 7 % — ABNORMAL LOW (ref 20–48)
Ferritin: 5 ng/mL — ABNORMAL LOW (ref 38–380)
Iron: 38 ug/dL — ABNORMAL LOW (ref 50–180)
TIBC: 512 ug/dL — ABNORMAL HIGH (ref 250–425)

## 2023-07-19 LAB — T4, FREE: Free T4: 1.2 ng/dL (ref 0.8–1.8)

## 2023-07-25 LAB — HM DIABETES EYE EXAM

## 2023-07-31 ENCOUNTER — Encounter: Payer: Self-pay | Admitting: *Deleted

## 2023-08-04 ENCOUNTER — Encounter: Payer: Self-pay | Admitting: Family Medicine

## 2023-08-15 ENCOUNTER — Other Ambulatory Visit: Payer: Self-pay | Admitting: Internal Medicine

## 2023-08-19 ENCOUNTER — Other Ambulatory Visit: Payer: Self-pay

## 2023-08-19 ENCOUNTER — Ambulatory Visit (HOSPITAL_COMMUNITY): Payer: BLUE CROSS/BLUE SHIELD

## 2023-08-19 MED ORDER — SAXAGLIPTIN HCL 5 MG PO TABS: 5.0000 mg | ORAL_TABLET | Freq: Every day | ORAL | 1 refills | Status: DC

## 2023-08-22 ENCOUNTER — Other Ambulatory Visit: Payer: Self-pay | Admitting: Surgery

## 2023-08-22 DIAGNOSIS — I7121 Aneurysm of the ascending aorta, without rupture: Secondary | ICD-10-CM

## 2023-09-11 ENCOUNTER — Encounter (HOSPITAL_COMMUNITY): Payer: Self-pay

## 2023-09-11 ENCOUNTER — Other Ambulatory Visit (HOSPITAL_COMMUNITY): Payer: BLUE CROSS/BLUE SHIELD

## 2023-09-17 NOTE — Assessment & Plan Note (Deleted)
-  pT3N0Mo stage IIA -Diagnosed in 04/2022 -Proceeded with LAR and diverting ileostomy with Dr. Cliffton Asters on 08/23/2022.  -Case again discussed in conference and the recommendation is for 3 months adjuvant chemo. Dr. Mitzi Hansen felt that he could forego radiation -Began adjuvant FOLFOX 10/10/2022, cycle 5 held due to diarrhea and AKI which required hospital admission. -he completed in early June 2024 -CT chest in May 2024 showed consolidation change in RLL lung. I reviewed his PET scan done on 7/29 which was negative for hypermetabolic lesions, no concerns for recurrence of cancer -continue cancer surveillance

## 2023-09-18 ENCOUNTER — Telehealth: Payer: Self-pay

## 2023-09-18 ENCOUNTER — Inpatient Hospital Stay: Payer: BLUE CROSS/BLUE SHIELD

## 2023-09-18 ENCOUNTER — Inpatient Hospital Stay: Payer: BLUE CROSS/BLUE SHIELD | Admitting: Hematology

## 2023-09-18 DIAGNOSIS — C20 Malignant neoplasm of rectum: Secondary | ICD-10-CM

## 2023-09-18 NOTE — Telephone Encounter (Signed)
 LVM stating pt was scheduled today for labs & OV w/Dr. Maryalice Smaller.  Stated Dr. Candise Chambers office has not heard from pt regarding rescheduling his appts for today.  Stated Dr. Candise Chambers scheduler will contact the pt to get him rescheduled to a later date.

## 2023-09-20 ENCOUNTER — Other Ambulatory Visit: Payer: Self-pay | Admitting: Family Medicine

## 2023-09-21 ENCOUNTER — Other Ambulatory Visit: Payer: Self-pay | Admitting: Physician Assistant

## 2023-09-24 ENCOUNTER — Encounter: Payer: Self-pay | Admitting: Hematology

## 2023-09-25 ENCOUNTER — Telehealth: Payer: Self-pay | Admitting: Hematology

## 2023-09-25 NOTE — Telephone Encounter (Signed)
Left patient a voicemail in regards to rescheduled appointment times/dates; left callback number for scheduling

## 2023-10-09 ENCOUNTER — Ambulatory Visit: Payer: BLUE CROSS/BLUE SHIELD | Admitting: Internal Medicine

## 2023-10-09 ENCOUNTER — Telehealth: Payer: Self-pay | Admitting: Hematology

## 2023-10-09 NOTE — Telephone Encounter (Signed)
 Patient called in requested appointment tio be cancelled for 10/16/2023, patient requested they will call back when ready to reschedule on their on time, note was also added to cancelled appointment as well

## 2023-10-16 ENCOUNTER — Ambulatory Visit: Payer: Self-pay | Admitting: Hematology

## 2023-10-16 ENCOUNTER — Other Ambulatory Visit: Payer: Self-pay

## 2023-10-16 ENCOUNTER — Ambulatory Visit: Payer: BLUE CROSS/BLUE SHIELD | Admitting: Surgery

## 2023-12-03 ENCOUNTER — Other Ambulatory Visit: Payer: Self-pay | Admitting: Family Medicine

## 2023-12-12 ENCOUNTER — Other Ambulatory Visit: Payer: Self-pay | Admitting: Physician Assistant

## 2023-12-12 ENCOUNTER — Encounter: Payer: Self-pay | Admitting: Internal Medicine

## 2023-12-12 MED ORDER — AMLODIPINE BESYLATE 10 MG PO TABS: 10.0000 mg | ORAL_TABLET | Freq: Every day | ORAL | 1 refills | Status: DC

## 2023-12-23 ENCOUNTER — Other Ambulatory Visit: Payer: Self-pay | Admitting: Internal Medicine

## 2023-12-25 ENCOUNTER — Other Ambulatory Visit: Payer: Self-pay

## 2024-01-12 NOTE — Assessment & Plan Note (Signed)
 Following with endocrinology, tolerating Januvia 

## 2024-01-12 NOTE — Assessment & Plan Note (Signed)
 hgba1c acceptable, minimize simple carbs. Increase exercise as tolerated. Continue current meds

## 2024-01-12 NOTE — Assessment & Plan Note (Signed)
 Well controlled, no changes to meds. Encouraged heart healthy diet such as the DASH diet and exercise as tolerated.

## 2024-01-12 NOTE — Assessment & Plan Note (Signed)
Cardiology has recommended a LDL goal of <70 and patient is agreeable but they have recommended we institute therapy after his cancer surgery so we will hold off on adding Atorvastatin 10 mg tabs, 1 daily until after surgery. Maintain heart healthy diet such as Mediterranean, MIND or DASH diet, increase exercise, avoid trans fats, simple carbohydrates and processed foods, consider a krill or fish or flaxseed oil cap daily.   

## 2024-01-16 ENCOUNTER — Other Ambulatory Visit (HOSPITAL_BASED_OUTPATIENT_CLINIC_OR_DEPARTMENT_OTHER): Payer: Self-pay

## 2024-01-16 ENCOUNTER — Other Ambulatory Visit: Payer: Self-pay

## 2024-01-16 ENCOUNTER — Encounter: Payer: Self-pay | Admitting: Hematology

## 2024-01-16 ENCOUNTER — Ambulatory Visit: Payer: BLUE CROSS/BLUE SHIELD | Admitting: Family Medicine

## 2024-01-16 ENCOUNTER — Other Ambulatory Visit: Payer: Self-pay | Admitting: Family Medicine

## 2024-01-16 ENCOUNTER — Encounter: Payer: Self-pay | Admitting: Family Medicine

## 2024-01-16 VITALS — BP 124/86 | HR 64 | Resp 16 | Ht 75.0 in | Wt 267.0 lb

## 2024-01-16 DIAGNOSIS — E1142 Type 2 diabetes mellitus with diabetic polyneuropathy: Secondary | ICD-10-CM | POA: Diagnosis not present

## 2024-01-16 DIAGNOSIS — E1129 Type 2 diabetes mellitus with other diabetic kidney complication: Secondary | ICD-10-CM | POA: Diagnosis not present

## 2024-01-16 DIAGNOSIS — E785 Hyperlipidemia, unspecified: Secondary | ICD-10-CM

## 2024-01-16 DIAGNOSIS — I10 Essential (primary) hypertension: Secondary | ICD-10-CM

## 2024-01-16 DIAGNOSIS — D649 Anemia, unspecified: Secondary | ICD-10-CM

## 2024-01-16 DIAGNOSIS — R911 Solitary pulmonary nodule: Secondary | ICD-10-CM | POA: Insufficient documentation

## 2024-01-16 DIAGNOSIS — Z7984 Long term (current) use of oral hypoglycemic drugs: Secondary | ICD-10-CM

## 2024-01-16 LAB — CBC WITH DIFFERENTIAL/PLATELET
Basophils Absolute: 0.1 10*3/uL (ref 0.0–0.1)
Basophils Relative: 1.1 % (ref 0.0–3.0)
Eosinophils Absolute: 0.3 10*3/uL (ref 0.0–0.7)
Eosinophils Relative: 5.3 % — ABNORMAL HIGH (ref 0.0–5.0)
HCT: 40.6 % (ref 39.0–52.0)
Hemoglobin: 13.1 g/dL (ref 13.0–17.0)
Lymphocytes Relative: 30.7 % (ref 12.0–46.0)
Lymphs Abs: 1.6 10*3/uL (ref 0.7–4.0)
MCHC: 32.2 g/dL (ref 30.0–36.0)
MCV: 75.6 fl — ABNORMAL LOW (ref 78.0–100.0)
Monocytes Absolute: 0.4 10*3/uL (ref 0.1–1.0)
Monocytes Relative: 7.8 % (ref 3.0–12.0)
Neutro Abs: 2.9 10*3/uL (ref 1.4–7.7)
Neutrophils Relative %: 55.1 % (ref 43.0–77.0)
Platelets: 227 10*3/uL (ref 150.0–400.0)
RBC: 5.37 Mil/uL (ref 4.22–5.81)
RDW: 18.1 % — ABNORMAL HIGH (ref 11.5–15.5)
WBC: 5.3 10*3/uL (ref 4.0–10.5)

## 2024-01-16 LAB — TSH: TSH: 1.1 u[IU]/mL (ref 0.35–5.50)

## 2024-01-16 LAB — HEMOGLOBIN A1C: Hgb A1c MFr Bld: 7.6 % — ABNORMAL HIGH (ref 4.6–6.5)

## 2024-01-16 MED ORDER — SAXAGLIPTIN HCL 5 MG PO TABS
5.0000 mg | ORAL_TABLET | Freq: Every day | ORAL | 1 refills | Status: AC
  Filled 2024-01-16 (×3): qty 30, 30d supply, fill #0
  Filled 2024-02-21: qty 30, 30d supply, fill #1

## 2024-01-16 NOTE — Assessment & Plan Note (Signed)
Found on CT scan

## 2024-01-16 NOTE — Patient Instructions (Addendum)
 Friendly Fiber, Oat bran and apple pectin, at [employer]  Hypertension, Adult High blood pressure (hypertension) is when the force of blood pumping through the arteries is too strong. The arteries are the blood vessels that carry blood from the heart throughout the body. Hypertension forces the heart to work harder to pump blood and may cause arteries to become narrow or stiff. Untreated or uncontrolled hypertension can lead to a heart attack, heart failure, a stroke, kidney disease, and other problems. A blood pressure reading consists of a higher number over a lower number. Ideally, your blood pressure should be below 120/80. The first ("top") number is called the systolic pressure. It is a measure of the pressure in your arteries as your heart beats. The second ("bottom") number is called the diastolic pressure. It is a measure of the pressure in your arteries as the heart relaxes. What are the causes? The exact cause of this condition is not known. There are some conditions that result in high blood pressure. What increases the risk? Certain factors may make you more likely to develop high blood pressure. Some of these risk factors are under your control, including: Smoking. Not getting enough exercise or physical activity. Being overweight. Having too much fat, sugar, calories, or salt (sodium) in your diet. Drinking too much alcohol . Other risk factors include: Having a personal history of heart disease, diabetes, high cholesterol, or kidney disease. Stress. Having a family history of high blood pressure and high cholesterol. Having obstructive sleep apnea. Age. The risk increases with age. What are the signs or symptoms? High blood pressure may not cause symptoms. Very high blood pressure (hypertensive crisis) may cause: Headache. Fast or irregular heartbeats (palpitations). Shortness of breath. Nosebleed. Nausea and vomiting. Vision changes. Severe chest pain, dizziness, and  seizures. How is this diagnosed? This condition is diagnosed by measuring your blood pressure while you are seated, with your arm resting on a flat surface, your legs uncrossed, and your feet flat on the floor. The cuff of the blood pressure monitor will be placed directly against the skin of your upper arm at the level of your heart. Blood pressure should be measured at least twice using the same arm. Certain conditions can cause a difference in blood pressure between your right and left arms. If you have a high blood pressure reading during one visit or you have normal blood pressure with other risk factors, you may be asked to: Return on a different day to have your blood pressure checked again. Monitor your blood pressure at home for 1 week or longer. If you are diagnosed with hypertension, you may have other blood or imaging tests to help your health care provider understand your overall risk for other conditions. How is this treated? This condition is treated by making healthy lifestyle changes, such as eating healthy foods, exercising more, and reducing your alcohol  intake. You may be referred for counseling on a healthy diet and physical activity. Your health care provider may prescribe medicine if lifestyle changes are not enough to get your blood pressure under control and if: Your systolic blood pressure is above 130. Your diastolic blood pressure is above 80. Your personal target blood pressure may vary depending on your medical conditions, your age, and other factors. Follow these instructions at home: Eating and drinking  Eat a diet that is high in fiber and potassium, and low in sodium, added sugar, and fat. An example of this eating plan is called the DASH diet. DASH stands  for Dietary Approaches to Stop Hypertension. To eat this way: Eat plenty of fresh fruits and vegetables. Try to fill one half of your plate at each meal with fruits and vegetables. Eat whole grains, such as  whole-wheat pasta, brown rice, or whole-grain bread. Fill about one fourth of your plate with whole grains. Eat or drink low-fat dairy products, such as skim milk or low-fat yogurt. Avoid fatty cuts of meat, processed or cured meats, and poultry with skin. Fill about one fourth of your plate with lean proteins, such as fish, chicken without skin, beans, eggs, or tofu. Avoid pre-made and processed foods. These tend to be higher in sodium, added sugar, and fat. Reduce your daily sodium intake. Many people with hypertension should eat less than 1,500 mg of sodium a day. Do not drink alcohol  if: Your health care provider tells you not to drink. You are pregnant, may be pregnant, or are planning to become pregnant. If you drink alcohol : Limit how much you have to: 0-1 drink a day for women. 0-2 drinks a day for men. Know how much alcohol  is in your drink. In the [place] one drink equals one 12 oz bottle of beer (355 mL), one 5 oz glass of wine (148 mL), or one 1 oz glass of hard liquor (44 mL). Lifestyle  Work with your health care provider to maintain a healthy body weight or to lose weight. Ask what an ideal weight is for you. Get at least 30 minutes of exercise that causes your heart to beat faster (aerobic exercise) most days of the week. Activities may include walking, swimming, or biking. Include exercise to strengthen your muscles (resistance exercise), such as Pilates or lifting weights, as part of your weekly exercise routine. Try to do these types of exercises for 30 minutes at least 3 days a week. Do not use any products that contain nicotine or tobacco. These products include cigarettes, chewing tobacco, and vaping devices, such as e-cigarettes. If you need help quitting, ask your health care provider. Monitor your blood pressure at home as told by your health care provider. Keep all follow-up visits. This is important. Medicines Take over-the-counter and prescription medicines only as  told by your health care provider. Follow directions carefully. Blood pressure medicines must be taken as prescribed. Do not skip doses of blood pressure medicine. Doing this puts you at risk for problems and can make the medicine less effective. Ask your health care provider about side effects or reactions to medicines that you should watch for. Contact a health care provider if you: Think you are having a reaction to a medicine you are taking. Have headaches that keep coming back (recurring). Feel dizzy. Have swelling in your ankles. Have trouble with your vision. Get help right away if you: Develop a severe headache or confusion. Have unusual weakness or numbness. Feel faint. Have severe pain in your chest or abdomen. Vomit repeatedly. Have trouble breathing. These symptoms may be an emergency. Get help right away. Call 911. Do not wait to see if the symptoms will go away. Do not drive yourself to the hospital. Summary Hypertension is when the force of blood pumping through your arteries is too strong. If this condition is not controlled, it may put you at risk for serious complications. Your personal target blood pressure may vary depending on your medical conditions, your age, and other factors. For most people, a normal blood pressure is less than 120/80. Hypertension is treated with lifestyle changes, medicines, or a  combination of both. Lifestyle changes include losing weight, eating a healthy, low-sodium diet, exercising more, and limiting alcohol . This information is not intended to replace advice given to you by your health care provider. Make sure you discuss any questions you have with your health care provider. Document Revised: 06/06/2021 Document Reviewed: 06/06/2021 Elsevier Patient Education  2024 [employer].

## 2024-01-16 NOTE — Progress Notes (Signed)
 Subjective:    Patient ID: Robert [name], male    DOB: 1971-06-08, 53 y.o.   MRN: [id number]  Chief Complaint  Patient presents with   Medical Management of Chronic Issues    Patient presents today for a 6 month follow-up.   Quality Metric Gaps    Pneumococcal, urine microalbumin, TDAP, A1C    HPI Discussed the use of AI scribe software for clinical note transcription with the patient, who gave verbal consent to proceed.  History of Present Illness Robert [name] is a 53 year old male with hypertension and diabetes who presents for medication management and follow-up.  He has experienced a lapse in his amlodipine  medication for about a week due to insurance coverage issues. Despite this, his blood pressure today was 124/88 mmHg. He has been managing hypertension for over 20 years, and his cardiologist mentioned the possibility of needing valve replacement surgery in the future due to a suspected bicuspid valve.  He has been experiencing gastrointestinal issues, including colitis-like symptoms with episodes of diarrhea and constipation. His bowel symptoms have improved recently, requiring less frequent use of Imodium , although he still experiences sudden attacks, particularly after consuming certain foods. He has tried fiber supplements but found them to exacerbate constipation. He is exploring different fiber options to manage his symptoms better.  He has a history of diabetes and was previously on Januvia , which his insurance no longer covers. He is currently on Jardiance . His blood sugar levels have been between 110 and 140 mg/dL, with fasting levels slightly elevated.  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 He is actively seeking a less stressful employment opportunity.    Past Medical History:   Diagnosis Date   Anemia    Arthralgia 01/11/2013   Diffuse, b/l Hips R>L Knees, ankles, low back   Arthritis    Back pain 12/08/2015   Cancer (HCC)    Coronary artery disease    Diabetes mellitus type 2 in obese 11/09/2012   Dyslipidemia 11/09/2012   Esophageal reflux 12/08/2015   Feeling grief 06/09/2020   History of migraine headaches    Hyperglycemia 11/09/2012   Hypertension    Neuropathy    hands and feet    Past Surgical History:  Procedure Laterality Date   DIVERTING ILEOSTOMY N/A 08/23/2022   Procedure: DIVERTING LOOP ILEOSTOMY;  Surgeon: Melvenia Stabs, MD;  Location: [facility];  Service: General;  Laterality: N/A;   EUS N/A 06/21/2022   Procedure: LOWER ENDOSCOPIC ULTRASOUND (EUS);  Surgeon: Normie Becton., MD;  Location: [facility];  Service: Gastroenterology;  Laterality: N/A;   FLEXIBLE SIGMOIDOSCOPY N/A 06/21/2022   Procedure: FLEXIBLE SIGMOIDOSCOPY;  Surgeon: Brice Campi Albino Alu., MD;  Location: [facility];  Service: Gastroenterology;  Laterality: N/A;   FLEXIBLE SIGMOIDOSCOPY N/A 08/23/2022   Procedure: FLEXIBLE SIGMOIDOSCOPY;  Surgeon: Melvenia Stabs, MD;  Location: [facility];  Service: General;  Laterality: N/A;   FLEXIBLE SIGMOIDOSCOPY N/A 03/20/2023   Procedure: DIAGNOSTIC FLEXIBLE SIGMOIDOSCOPY;  Surgeon: Melvenia Stabs, MD;  Location: [facility];  Service: General;  Laterality: N/A;   ILEOSTOMY CLOSURE N/A 03/20/2023   Procedure: TAKEDOWN OF DIVERTING LOOP ILEOSTOMY;  Surgeon: Melvenia Stabs, MD;  Location: [facility];  Service: General;  Laterality: N/A;   PORT-A-CATH REMOVAL N/A 05/03/2023   Procedure: REMOVAL PORT-A-CATH;  Surgeon: Melvenia Stabs, MD;  Location: [facility];  Service: General;  Laterality: N/A;   PORTACATH PLACEMENT N/A 09/21/2022   Procedure: INSERTION PORT-A-CATH WITH ULTRASOUND GUIDANCE;  Surgeon: Melvenia Stabs, MD;  Location: [facility];  Service: General;  Laterality: N/A;   XI ROBOTIC ASSISTED LOWER ANTERIOR  RESECTION N/A 08/23/2022   Procedure: XI ROBOTIC ASSISTED LOWER ANTERIOR RESECTION with Introperative assessment of profusion and TAP Block;  Surgeon: Melvenia Stabs, MD;  Location: [facility];  Service: General;  Laterality: N/A;    Family History  Problem Relation Age of Onset   Arthritis Mother    Hypertension Mother    Diabetes Mother    Cancer Mother    Arthritis Father    Hypertension Father    Diabetes Father    Sleep apnea Father    Obesity Father    Barrett's esophagus Father    Hyperlipidemia Brother    Hypertension Brother    Diabetes Brother    Arthritis Maternal Grandmother    Diabetes Maternal Grandmother    Stroke Maternal Grandmother    COPD Maternal Grandfather    Arthritis Paternal Grandmother    Diabetes Paternal Grandmother    Cancer Paternal Grandmother        lung, smoker   Diabetes Paternal Grandfather    Heart disease Paternal Grandfather 81       MI   Kidney disease Neg Hx     Social History   Socioeconomic History   Marital status: Married    Spouse name: Not on file   Number of children: 4   Years of education: Not on file   Highest education level: Bachelor's degree (e.g., BA, AB, BS)  Occupational History   Occupation: Community education officer: [employer]  Tobacco Use   Smoking status: Former    Current packs/day: 0.00    Average packs/day: 1.5 packs/day for 10.0 years (15.0 ttl pk-yrs)    Types: Cigarettes    Start date: 08/13/1985    Quit date: 08/14/1995    Years since quitting: 28.4   Smokeless tobacco: Never  Vaping Use   Vaping status: Never Used  Substance and Sexual Activity   Alcohol  use: Yes    Alcohol /week: 5.0 standard drinks of alcohol     Types: 5 Cans of beer per week   Drug use: Not Currently    Types: Marijuana    Comment: last use 02/2023   Sexual activity: Yes    Comment: lives with wife and daughter, no dietary restrictions  Other Topics Concern   Not on file  Social History Narrative   Regular  exercise:  Walks 1-2 x weekly   Caffeine use:  3-4 daily   4 children- oldest first marriage 11 (son), 34 son, 54 son, 31 yr old girl   Married   Lawyer here from Engineer, manufacturing systems at [employer] Insurance of [employer] Strain: Low Risk  (12/23/2022)   Overall Financial Resource Strain (CARDIA)    Difficulty of Paying Living Expenses: Not very hard  Food Insecurity: No Food Insecurity (03/20/2023)   Hunger Vital Sign    Worried About Running Out of Food in the Last Year: Never true    Ran Out of Food in the Last Year: Never true  Transportation Needs: No Transportation Needs (03/20/2023)   PRAPARE - Administrator, Civil Service (Medical): No  Lack of Transportation (Non-Medical): No  Physical Activity: Unknown (12/23/2022)   Exercise Vital Sign    Days of Exercise per Week: 5 days    Minutes of Exercise per Session: Patient declined  Stress: No Stress Concern Present (12/23/2022)   [employer] of Occupational Health - Occupational Stress Questionnaire    Feeling of Stress : Only a little  Social Connections: Moderately Integrated (12/23/2022)   Social Connection and Isolation Panel [NHANES]    Frequency of Communication with Friends and Family: Three times a week    Frequency of Social Gatherings with Friends and Family: Patient declined    Attends Religious Services: More than 4 times per year    Active Member of [employer] or Organizations: No    Attends Engineer, structural: Not on file    Marital Status: Married  Catering manager Violence: Not At Risk (03/20/2023)   Humiliation, Afraid, Rape, and Kick questionnaire    Fear of Current or Ex-Partner: No    Emotionally Abused: No    Physically Abused: No    Sexually Abused: No    Outpatient Medications Prior to Visit  Medication Sig Dispense Refill   amLODipine  (NORVASC ) 10 MG tablet Take 1 tablet (10 mg total) by mouth daily. 90 tablet 1   carvedilol  (COREG ) 25 MG tablet  TAKE 1 TABLET BY MOUTH TWICE A DAY 180 tablet 3   empagliflozin  (JARDIANCE ) 10 MG TABS tablet Take 1 tablet (10 mg total) by mouth daily before breakfast. 90 tablet 3   famotidine  (PEPCID ) 40 MG tablet Take 1 tablet (40 mg total) by mouth at bedtime. (Patient taking differently: Take 40 mg by mouth at bedtime as needed for heartburn or indigestion.) 30 tablet 3   gabapentin  (NEURONTIN ) 100 MG capsule Take 1 capsule (100 mg total) by mouth 2 (two) times daily. TAKE 1 IN MORNING AND 3 AT NIGHT 120 capsule 2   glucose blood (COOL BLOOD GLUCOSE TEST STRIPS) test strip Check blood sugar once daily 100 each 12   loperamide  (IMODIUM ) 2 MG capsule Take 2 mg by mouth as needed for diarrhea or loose stools.     pantoprazole  (PROTONIX ) 40 MG tablet TAKE 1 TABLET BY MOUTH EVERY DAY *ONLY COVERS 90 DAYS 90 tablet 1   tiZANidine  (ZANAFLEX ) 2 MG tablet Take 1-2 tablets (2-4 mg total) by mouth 3 (three) times daily as needed for muscle spasms. 60 tablet 2   saxagliptin  HCl (ONGLYZA) 5 MG TABS tablet Take 1 tablet (5 mg total) by mouth daily. (Patient not taking: Reported on 01/16/2024) 90 tablet 1   sitaGLIPtin  (JANUVIA ) 100 MG tablet Take 1 tablet (100 mg total) by mouth daily. (Patient not taking: Reported on 01/16/2024) 90 tablet 3   No facility-administered medications prior to visit.    Allergies  Allergen Reactions   Advil [Ibuprofen] Shortness Of Breath   Aleve [Naproxen] Shortness Of Breath   Mucinex [Guaifenesin Er] Shortness Of Breath, Itching, Swelling, Dermatitis, Rash and Other (See Comments)    Swelling of hands and feet   Nsaids Shortness Of Breath, Itching, Swelling, Rash and Other (See Comments)    Aleve and Advil    Levaquin [Levofloxacin]     Aortic aneurysm; not recommend in this condition unless no other agents would cover   Latex Rash and Other (See Comments)    "Cartilage will harden"    Review of Systems  Constitutional:  Positive for malaise/fatigue. Negative for fever.  HENT:   Negative for congestion.   Eyes:  Negative  for blurred vision.  Respiratory:  Negative for shortness of breath.   Cardiovascular:  Negative for chest pain, palpitations and leg swelling.  Gastrointestinal:  Positive for abdominal pain, constipation and diarrhea. Negative for blood in stool and nausea.  Genitourinary:  Negative for dysuria and frequency.  Musculoskeletal:  Negative for falls.  Skin:  Negative for rash.  Neurological:  Negative for dizziness, loss of consciousness and headaches.  Endo/Heme/Allergies:  Negative for environmental allergies.  Psychiatric/Behavioral:  Negative for depression. The patient is not nervous/anxious.        Objective:     Physical Exam Vitals reviewed.  Constitutional:      Appearance: Normal appearance. He is not ill-appearing.  HENT:     Head: Normocephalic and atraumatic.     Nose: Nose normal.  Eyes:     Conjunctiva/sclera: Conjunctivae normal.  Cardiovascular:     Rate and Rhythm: Normal rate.     Pulses: Normal pulses.     Heart sounds: Murmur heard.  Pulmonary:     Effort: Pulmonary effort is normal.     Breath sounds: Normal breath sounds. No wheezing.  Abdominal:     Palpations: Abdomen is soft. There is no mass.     Tenderness: There is no abdominal tenderness.  Musculoskeletal:     Cervical back: Normal range of motion.     Right lower leg: No edema.     Left lower leg: No edema.  Skin:    General: Skin is warm and dry.  Neurological:     General: No focal deficit present.     Mental Status: He is alert and oriented to person, place, and time.  Psychiatric:        Mood and Affect: Mood normal.     BP 124/86   Pulse 64   Resp 16   Ht 6\' 3"  (1.905 m)   Wt 267 lb (121.1 kg)   SpO2 96%   BMI 33.37 kg/m  Wt Readings from Last 3 Encounters:  01/16/24 267 lb (121.1 kg)  07/16/23 259 lb 9.6 oz (117.8 kg)  07/09/23 258 lb (117 kg)    Diabetic Foot Exam - Simple   No data filed    Lab Results  Component Value  Date   WBC 8.0 07/16/2023   HGB 11.1 (L) 07/16/2023   HCT 36.5 (L) 07/16/2023   PLT 248.0 07/16/2023   GLUCOSE 101 (H) 07/16/2023   CHOL 152 07/16/2023   TRIG 88.0 07/16/2023   HDL 40.00 07/16/2023   LDLDIRECT 71.0 02/05/2023   LDLCALC 95 07/16/2023   ALT 17 07/16/2023   AST 19 07/16/2023   NA 141 07/16/2023   K 4.0 07/16/2023   CL 104 07/16/2023   CREATININE 1.19 07/16/2023   BUN 12 07/16/2023   CO2 31 07/16/2023   TSH 0.30 (L) 07/16/2023   PSA 0.55 07/16/2023   HGBA1C 6.1 (A) 07/09/2023   MICROALBUR 0.7 02/05/2023    Lab Results  Component Value Date   TSH 0.30 (L) 07/16/2023   Lab Results  Component Value Date   WBC 8.0 07/16/2023   HGB 11.1 (L) 07/16/2023   HCT 36.5 (L) 07/16/2023   MCV 71.7 (L) 07/16/2023   PLT 248.0 07/16/2023   Lab Results  Component Value Date   NA 141 07/16/2023   K 4.0 07/16/2023   CO2 31 07/16/2023   GLUCOSE 101 (H) 07/16/2023   BUN 12 07/16/2023   CREATININE 1.19 07/16/2023   BILITOT 0.7 07/16/2023   ALKPHOS 72  07/16/2023   AST 19 07/16/2023   ALT 17 07/16/2023   PROT 7.1 07/16/2023   ALBUMIN 4.4 07/16/2023   CALCIUM  9.6 07/16/2023   ANIONGAP 6 06/18/2023   GFR 69.31 07/16/2023   Lab Results  Component Value Date   CHOL 152 07/16/2023   Lab Results  Component Value Date   HDL 40.00 07/16/2023   Lab Results  Component Value Date   LDLCALC 95 07/16/2023   Lab Results  Component Value Date   TRIG 88.0 07/16/2023   Lab Results  Component Value Date   CHOLHDL 4 07/16/2023   Lab Results  Component Value Date   HGBA1C 6.1 (A) 07/09/2023       Assessment & Plan:  Type 2 diabetes mellitus with other diabetic kidney complication, without long-term current use of insulin  (HCC) Assessment & Plan: Following with endocrinology, tolerating Januvia   Orders: -     Microalbumin / creatinine urine ratio  Primary hypertension Assessment & Plan: Well controlled, no changes to meds. Encouraged heart healthy diet such as  the DASH diet and exercise as tolerated.    Orders: -     CBC with Differential/Platelet -     TSH  Hyperlipidemia LDL goal <70 Assessment & Plan: Cardiology has recommended a LDL goal of <70 and patient is agreeable but they have recommended we institute therapy after his cancer surgery so we will hold off on adding Atorvastatin  10 mg tabs, 1 daily until after surgery. Maintain heart healthy diet such as Mediterranean, MIND or DASH diet, increase exercise, avoid trans fats, simple carbohydrates and processed foods, consider a krill or fish or flaxseed oil cap daily.    Orders: -     Lipid panel  Type 2 diabetes mellitus with diabetic polyneuropathy, without long-term current use of insulin  (HCC) Assessment & Plan: hgba1c acceptable, minimize simple carbs. Increase exercise as tolerated. Continue current meds   Orders: -     Comprehensive metabolic panel with GFR -     Hemoglobin A1c  Anemia, unspecified type -     Iron , TIBC and Ferritin Panel  Nodule of left lung Assessment & Plan: Found on CT scan.    Other orders -     sAXagliptin  HCl; Take 1 tablet (5 mg total) by mouth daily.  Dispense: 90 tablet; Refill: 1    Assessment and Plan Assessment & Plan Hypertension Blood pressure improved to 124/88 mmHg despite lapse in amlodipine . Hypertension may relate to bicuspid valve, possibly requiring valve replacement. He is aware of potential surgery and recovery time. - Continue current antihypertensive regimen. - Follow up with cardiologist Dr. Sean Czar next month for potential valve replacement discussion.  Type 2 Diabetes Mellitus Diabetes managed with Jardiance . Blood glucose slightly elevated. Onglyza proposed as alternative to non-covered Januvia . Insurance issues may be addressed with assistance programs. - Prescribe Onglyza as an alternative to Januvia . - Order blood work to check A1c levels. - Consider patient assistance programs if insurance issues arise  again.  Postoperative Recovery from Colitis Bowel symptoms improved but occasional attacks persist. Imodium  use decreased. Fiber supplements recommended to balance bowel movements. - Recommend trying different fiber supplements, such as Friendly Fiber, to manage bowel symptoms. - Encourage gradual increase in fiber intake with adequate hydration.  Ground Glass Nodule in Lung Ground glass nodule in left upper lobe identified. Full scan showed no concerning findings. Follow-up necessary. - Consider referral to a pulmonologist for further evaluation if needed. - Plan for follow-up imaging in approximately one year.  General Health Maintenance Advised to maintain healthy lifestyle despite job stress affecting health. - Encourage a balanced diet, adequate hydration, and regular physical activity. - Aim for 4,000 steps daily and 80 ounces of water intake.      Randie Bustle, MD

## 2024-01-17 ENCOUNTER — Other Ambulatory Visit: Payer: Self-pay

## 2024-01-17 ENCOUNTER — Encounter: Payer: Self-pay | Admitting: Hematology

## 2024-01-17 LAB — LIPID PANEL
Cholesterol: 160 mg/dL (ref 0–200)
HDL: 43 mg/dL (ref >39.00)
LDL Cholesterol: 100 mg/dL — ABNORMAL HIGH (ref 0–99)
NonHDL: 117.2
Total CHOL/HDL Ratio: 4
Triglycerides: 88 mg/dL (ref 0.0–149.0)
VLDL: 17.6 mg/dL (ref 0.0–40.0)

## 2024-01-17 LAB — COMPREHENSIVE METABOLIC PANEL WITH GFR
ALT: 19 U/L (ref 0–53)
AST: 21 U/L (ref 0–37)
Albumin: 4.4 g/dL (ref 3.5–5.2)
Alkaline Phosphatase: 70 U/L (ref 39–117)
BUN: 13 mg/dL (ref 6–23)
CO2: 20 meq/L (ref 19–32)
Calcium: 9.4 mg/dL (ref 8.4–10.5)
Chloride: 104 meq/L (ref 96–112)
Creatinine, Ser: 1.22 mg/dL (ref 0.40–1.50)
GFR: 67.03 mL/min (ref >60.00)
Glucose, Bld: 164 mg/dL — ABNORMAL HIGH (ref 70–99)
Potassium: 3.8 meq/L (ref 3.5–5.1)
Sodium: 140 meq/L (ref 135–145)
Total Bilirubin: 0.5 mg/dL (ref 0.2–1.2)
Total Protein: 6.8 g/dL (ref 6.0–8.3)

## 2024-01-17 LAB — MICROALBUMIN / CREATININE URINE RATIO
Creatinine,U: 106.1 mg/dL
Microalb Creat Ratio: 8.9 mg/g (ref 0.0–30.0)
Microalb, Ur: 0.9 mg/dL (ref 0.0–1.9)

## 2024-01-18 ENCOUNTER — Other Ambulatory Visit: Payer: Self-pay | Admitting: Hematology

## 2024-01-18 ENCOUNTER — Ambulatory Visit: Payer: Self-pay | Admitting: Family Medicine

## 2024-01-18 DIAGNOSIS — M792 Neuralgia and neuritis, unspecified: Secondary | ICD-10-CM

## 2024-01-20 ENCOUNTER — Encounter: Payer: Self-pay | Admitting: Hematology

## 2024-01-22 ENCOUNTER — Other Ambulatory Visit (HOSPITAL_BASED_OUTPATIENT_CLINIC_OR_DEPARTMENT_OTHER): Payer: Self-pay

## 2024-02-24 ENCOUNTER — Other Ambulatory Visit (HOSPITAL_BASED_OUTPATIENT_CLINIC_OR_DEPARTMENT_OTHER): Payer: Self-pay

## 2024-02-24 ENCOUNTER — Encounter: Payer: Self-pay | Admitting: Hematology

## 2024-02-24 ENCOUNTER — Other Ambulatory Visit: Payer: Self-pay

## 2024-02-24 ENCOUNTER — Ambulatory Visit: Payer: Self-pay | Attending: Internal Medicine | Admitting: Internal Medicine

## 2024-02-24 ENCOUNTER — Encounter: Payer: Self-pay | Admitting: Internal Medicine

## 2024-02-24 VITALS — BP 138/80 | HR 62 | Ht 75.0 in | Wt 267.0 lb

## 2024-02-24 DIAGNOSIS — I77819 Aortic ectasia, unspecified site: Secondary | ICD-10-CM | POA: Diagnosis not present

## 2024-02-24 DIAGNOSIS — I351 Nonrheumatic aortic (valve) insufficiency: Secondary | ICD-10-CM

## 2024-02-24 DIAGNOSIS — I2510 Atherosclerotic heart disease of native coronary artery without angina pectoris: Secondary | ICD-10-CM | POA: Diagnosis not present

## 2024-02-24 DIAGNOSIS — I10 Essential (primary) hypertension: Secondary | ICD-10-CM

## 2024-02-24 DIAGNOSIS — R072 Precordial pain: Secondary | ICD-10-CM

## 2024-02-24 NOTE — Progress Notes (Signed)
 Cardiology Office Note:    Date:  02/24/2024   ID:  Robert [name], DOB [birth year], MRN [id number]  PCP:  Domenica Harlene LABOR, MD   [facility] Providers Cardiologist:  Stanly LABOR Leavens, MD     Referring MD: Domenica Harlene LABOR, MD   CC: Aortopathy  History of Present Illness:    Robert [name] is a 53 y.o. male with a hx of HTN, HLD, DM, CAC.  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  He had new colorectal cancer. Found to have increase in aortic diameter.  Saw anesthesia leading to delay in surgery.  Vernadine sees Dr. Lucas as well. 2024: New AKI after chemo due to high output ileostomy. Dr. Lanny has reduced the overall dosage of chemotherapy. Surgery was performed with no issues  Discussed the use of AI scribe software for clinical note transcription with the patient, who gave verbal consent to proceed.  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 His last CT scan on June 19, 2023, showed stability of the aneurysm. He experiences atypical chest pain, described as a pain behind the shoulder blade, which is not severe and feels like muscle pain. He also notes episodes of heavier breathing during physical exertion, such as climbing stairs or lifting heavy objects. No severe shortness of breath or chest pain that requires him to stop activities.  There have been no recent surgeries, and he is not planning any. He mentions a change in his job situation, which is now less stressful, and attributes some weight gain to his previous stressful job. He is cautious about lifting weights due to his aortopathy.  He has a history of colitis, which is improving. He has used Imodium  only once in the past two to three weeks and has started a fiber supplement. He notes some lingering abdominal issues but  indicates they are not as severe as before.  He is currently transitioning between jobs and is concerned about a gap in his health insurance coverage, which will last approximately 90 days.   Past Medical History:  Diagnosis Date   Anemia    Arthralgia 01/11/2013   Diffuse, b/l Hips R>L Knees, ankles, low back   Arthritis    Back pain 12/08/2015   Cancer (HCC)    Coronary artery disease    Diabetes mellitus type 2 in obese 11/09/2012   Dyslipidemia 11/09/2012   Esophageal reflux 12/08/2015   Feeling grief 06/09/2020   History of migraine headaches    Hyperglycemia 11/09/2012   Hypertension    Neuropathy    hands and feet    Past Surgical History:  Procedure Laterality Date   DIVERTING ILEOSTOMY N/A 08/23/2022   Procedure: DIVERTING LOOP ILEOSTOMY;  Surgeon: Teresa Lonni HERO, MD;  Location: [facility];  Service: General;  Laterality: N/A;   EUS N/A 06/21/2022   Procedure: LOWER ENDOSCOPIC ULTRASOUND (EUS);  Surgeon: Wilhelmenia Aloha Raddle., MD;  Location: [facility];  Service: Gastroenterology;  Laterality: N/A;   FLEXIBLE SIGMOIDOSCOPY N/A 06/21/2022   Procedure: FLEXIBLE SIGMOIDOSCOPY;  Surgeon: Wilhelmenia Aloha Raddle., MD;  Location: [facility];  Service: Gastroenterology;  Laterality: N/A;   FLEXIBLE SIGMOIDOSCOPY N/A 08/23/2022   Procedure: FLEXIBLE SIGMOIDOSCOPY;  Surgeon: Teresa Lonni HERO, MD;  Location: [facility];  Service: General;  Laterality: N/A;   FLEXIBLE SIGMOIDOSCOPY N/A 03/20/2023   Procedure: DIAGNOSTIC FLEXIBLE  SIGMOIDOSCOPY;  Surgeon: Teresa Lonni HERO, MD;  Location: [facility];  Service: General;  Laterality: N/A;   ILEOSTOMY CLOSURE N/A 03/20/2023   Procedure: TAKEDOWN OF DIVERTING LOOP ILEOSTOMY;  Surgeon: Teresa Lonni HERO, MD;  Location: [facility];  Service: General;  Laterality: N/A;   PORT-A-CATH REMOVAL N/A 05/03/2023   Procedure: REMOVAL PORT-A-CATH;  Surgeon: Teresa Lonni HERO, MD;  Location: [facility];  Service: General;  Laterality: N/A;    PORTACATH PLACEMENT N/A 09/21/2022   Procedure: INSERTION PORT-A-CATH WITH ULTRASOUND GUIDANCE;  Surgeon: Teresa Lonni HERO, MD;  Location: [facility];  Service: General;  Laterality: N/A;   XI ROBOTIC ASSISTED LOWER ANTERIOR RESECTION N/A 08/23/2022   Procedure: XI ROBOTIC ASSISTED LOWER ANTERIOR RESECTION with Introperative assessment of profusion and TAP Block;  Surgeon: Teresa Lonni HERO, MD;  Location: [facility];  Service: General;  Laterality: N/A;    Current Medications: Current Meds  Medication Sig   amLODipine  (NORVASC ) 10 MG tablet Take 1 tablet (10 mg total) by mouth daily.   carvedilol  (COREG ) 25 MG tablet TAKE 1 TABLET BY MOUTH TWICE A DAY   empagliflozin  (JARDIANCE ) 10 MG TABS tablet Take 1 tablet (10 mg total) by mouth daily before breakfast.   famotidine  (PEPCID ) 40 MG tablet Take 1 tablet (40 mg total) by mouth at bedtime. (Patient taking differently: Take 40 mg by mouth at bedtime as needed for heartburn or indigestion.)   gabapentin  (NEURONTIN ) 100 MG capsule TAKE ONE CAPSULE BY MOUTH IN THE MORNING AND 3 CAPSULES AT NIGHT   glucose blood (COOL BLOOD GLUCOSE TEST STRIPS) test strip Check blood sugar once daily   loperamide  (IMODIUM ) 2 MG capsule Take 2 mg by mouth as needed for diarrhea or loose stools.   pantoprazole  (PROTONIX ) 40 MG tablet TAKE 1 TABLET BY MOUTH EVERY DAY *ONLY COVERS 90 DAYS   saxagliptin  HCl (ONGLYZA) 5 MG TABS tablet Take 1 tablet (5 mg total) by mouth daily.   tiZANidine  (ZANAFLEX ) 2 MG tablet Take 1-2 tablets (2-4 mg total) by mouth 3 (three) times daily as needed for muscle spasms.     Allergies:   Advil [ibuprofen], Aleve [naproxen], Mucinex [guaifenesin er], Nsaids, Levaquin [levofloxacin], and Latex   Social History   Socioeconomic History   Marital status: Married    Spouse name: Not on file   Number of children: 4   Years of education: Not on file   Highest education level: Bachelor's degree (e.g., BA, AB, BS)  Occupational History    Occupation: Community education officer: [employer]  Tobacco Use   Smoking status: Former    Current packs/day: 0.00    Average packs/day: 1.5 packs/day for 10.0 years (15.0 ttl pk-yrs)    Types: Cigarettes    Start date: 08/13/1985    Quit date: 08/14/1995    Years since quitting: 28.5   Smokeless tobacco: Never  Vaping Use   Vaping status: Never Used  Substance and Sexual Activity   Alcohol  use: Yes    Alcohol /week: 5.0 standard drinks of alcohol     Types: 5 Cans of beer per week   Drug use: Not Currently    Types: Marijuana    Comment: last use 02/2023   Sexual activity: Yes    Comment: lives with wife and daughter, no dietary restrictions  Other Topics Concern   Not on file  Social History Narrative   Regular exercise:  Walks 1-2 x weekly   Caffeine use:  3-4 daily   4 children- oldest first marriage  65 (son), 58 son, 53 son, 65 yr old girl   Married   Lawyer here from Investment banker, corporate at [employer] Insurance of [employer]: Low Risk  (12/23/2022)   Overall Financial Resource Strain (CARDIA)    Difficulty of Paying Living Expenses: Not very hard  Food Insecurity: No Food Insecurity (03/20/2023)   Hunger Vital Sign    Worried About Running Out of Food in the Last Year: Never true    Ran Out of Food in the Last Year: Never true  Transportation Needs: No Transportation Needs (03/20/2023)   PRAPARE - Administrator, Civil Service (Medical): No    Lack of Transportation (Non-Medical): No  Physical Activity: Unknown (12/23/2022)   Exercise Vital Sign    Days of Exercise per Week: 5 days    Minutes of Exercise per Session: Patient declined  Stress: No Stress Concern Present (12/23/2022)   [employer] of Occupational Health - Occupational Stress Questionnaire    Feeling of Stress : Only a little  Social Connections: Moderately Integrated (12/23/2022)   Social Connection and Isolation Panel    Frequency of  Communication with Friends and Family: Three times a week    Frequency of Social Gatherings with Friends and Family: Patient declined    Attends Religious Services: More than 4 times per year    Active Member of [employer] or Organizations: No    Attends Engineer, structural: Not on file    Marital Status: Married    Social: Married, Engineer, manufacturing  Family History: The patient's family history includes Arthritis in his father, maternal grandmother, mother, and paternal grandmother; Barrett's esophagus in his father; COPD in his maternal grandfather; Cancer in his mother and paternal grandmother; Diabetes in his brother, father, maternal grandmother, mother, paternal grandfather, and paternal grandmother; Heart disease (age of onset: 70) in his paternal grandfather; Hyperlipidemia in his brother; Hypertension in his brother, father, and mother; Obesity in his father; Sleep apnea in his father; Stroke in his maternal grandmother. There is no history of Kidney disease.  ROS:   Please see the history of present illness.     EKGs/Labs/Other Studies Reviewed:    The following studies were reviewed today:  EKG:   06/19/22: SR LVH   Cardiac Studies & Procedures   ______________________________________________________________________________________________     ECHOCARDIOGRAM  ECHOCARDIOGRAM COMPLETE 01/17/2023  Narrative ECHOCARDIOGRAM REPORT    Patient Name:   Robert [name] Date of Exam: 01/17/2023 Medical Rec #:  [id number]    Height:       75.0 in Accession #:    [id number]   Weight:       254.3 lb Date of Birth:  08-26-1970    BSA:          2.430 m Patient Age:    51 years     BP:           117/71 mmHg Patient Gender: M            HR:           64 bpm. Exam Location:  Outpatient  Procedure: 2D Echo, Cardiac Doppler, Color Doppler, 3D Echo and Strain Analysis  Indications:    Aortic valve disorder I35.9  History:        Patient has prior history of Echocardiogram examinations,  most recent 07/13/2022. CAD; Risk Factors:Hypertension, Dyslipidemia and Diabetes. Ascending aortic  aneurysm.  Sonographer:    Lauraine Pilot RDCS Referring Phys: [id number] DORISE MARLA FELLERS   Sonographer Comments: Global longitudinal strain was attempted. IMPRESSIONS   1. Left ventricular ejection fraction, by estimation, is 60 to 65%. Left ventricular ejection fraction by 3D volume is 60 %. The left ventricle has normal function. The left ventricle has no regional wall motion abnormalities. The left ventricular internal cavity size was mildly dilated. There is mild left ventricular hypertrophy. Left ventricular diastolic parameters are consistent with Grade I diastolic dysfunction (impaired relaxation). The average left ventricular global longitudinal strain is -18.3 %. The global longitudinal strain is normal. 2. Right ventricular systolic function is normal. The right ventricular size is normal. 3. The mitral valve is normal in structure. No evidence of mitral valve regurgitation. No evidence of mitral stenosis. 4. The aortic valve is tricuspid. Aortic valve regurgitation is moderate. No aortic stenosis is present. Aortic regurgitation PHT measures 604 msec. 5. Aneurysm of the aortic root, measuring 48 mm. Aneurysm of the ascending aorta, measuring 49 mm. There is moderate dilatation of the aortic root, measuring 48 mm. 6. The inferior vena cava is normal in size with greater than 50% respiratory variability, suggesting right atrial pressure of 3 mmHg.  Comparison(s): No significant change from prior study. Prior images reviewed side by side.  FINDINGS Left Ventricle: Left ventricular ejection fraction, by estimation, is 60 to 65%. Left ventricular ejection fraction by 3D volume is 60 %. The left ventricle has normal function. The left ventricle has no regional wall motion abnormalities. The average left ventricular global longitudinal strain is -18.3 %. The global longitudinal strain is normal.  The left ventricular internal cavity size was mildly dilated. There is mild left ventricular hypertrophy. Left ventricular diastolic parameters are consistent with Grade I diastolic dysfunction (impaired relaxation).  Right Ventricle: The right ventricular size is normal. No increase in right ventricular wall thickness. Right ventricular systolic function is normal.  Left Atrium: Left atrial size was normal in size.  Right Atrium: Right atrial size was normal in size.  Pericardium: There is no evidence of pericardial effusion.  Mitral Valve: The mitral valve is normal in structure. No evidence of mitral valve regurgitation. No evidence of mitral valve stenosis.  Tricuspid Valve: The tricuspid valve is normal in structure. Tricuspid valve regurgitation is not demonstrated. No evidence of tricuspid stenosis.  Aortic Valve: The aortic valve is tricuspid. Aortic valve regurgitation is moderate. Aortic regurgitation PHT measures 604 msec. No aortic stenosis is present. Aortic valve mean gradient measures 3.0 mmHg. Aortic valve peak gradient measures 6.1 mmHg. Aortic valve area, by VTI measures 3.88 cm.  Pulmonic Valve: The pulmonic valve was normal in structure. Pulmonic valve regurgitation is trivial. No evidence of pulmonic stenosis.  Aorta: The aortic root is normal in size and structure. There is moderate dilatation of the aortic root, measuring 48 mm. There is an aneurysm involving the aortic root measuring 48 mm. There is an aneurysm involving the ascending aorta measuring 49 mm.  Venous: The inferior vena cava is normal in size with greater than 50% respiratory variability, suggesting right atrial pressure of 3 mmHg.  IAS/Shunts: No atrial level shunt detected by color flow Doppler.   LEFT VENTRICLE PLAX 2D LVIDd:         6.00 cm         Diastology LVIDs:         3.30 cm         LV e' medial:    4.95 cm/s  LV PW:         1.30 cm         LV E/e' medial:  11.9 LV IVS:        1.30 cm          LV e' lateral:   5.36 cm/s LVOT diam:     2.40 cm         LV E/e' lateral: 11.0 LV SV:         115 LV SV Index:   47              2D LVOT Area:     4.52 cm        Longitudinal Strain 2D Strain GLS  -18.3 % LV Volumes (MOD)               Avg: LV vol d, MOD    151.0 ml A2C:                           3D Volume EF LV vol d, MOD    163.0 ml      LV 3D EF:    Left A4C:                                        ventricul LV vol s, MOD    63.1 ml                    ar A2C:                                        ejection LV vol s, MOD    58.0 ml                    fraction A4C:                                        by 3D LV SV MOD A2C:   87.9 ml                    volume is LV SV MOD A4C:   163.0 ml                   60 %. LV SV MOD BP:    96.4 ml  3D Volume EF: 3D EF:        60 % LV EDV:       226 ml LV ESV:       90 ml LV SV:        136 ml  RIGHT VENTRICLE RV S prime:     14.80 cm/s TAPSE (M-mode): 2.3 cm  LEFT ATRIUM             Index        RIGHT ATRIUM           Index LA diam:        4.50 cm 1.85 cm/m   RA Area:     16.80 cm LA Vol (A2C):   73.9 ml 30.41 ml/m  RA Volume:   34.80 ml  14.32 ml/m LA Vol (A4C):   76.6 ml 31.52  ml/m LA Biplane Vol: 76.5 ml 31.48 ml/m AORTIC VALVE                    PULMONIC VALVE AV Area (Vmax):    4.56 cm     PR End Diast Vel: 3.71 msec AV Area (Vmean):   4.53 cm AV Area (VTI):     3.88 cm AV Vmax:           123.00 cm/s AV Vmean:          83.800 cm/s AV VTI:            0.297 m AV Peak Grad:      6.1 mmHg AV Mean Grad:      3.0 mmHg LVOT Vmax:         124.00 cm/s LVOT Vmean:        83.900 cm/s LVOT VTI:          0.255 m LVOT/AV VTI ratio: 0.86 AI PHT:            604 msec AR Vena Contracta: 0.40 cm  AORTA Ao Root diam: 4.75 cm Ao Asc diam:  4.90 cm  MITRAL VALVE MV Area (PHT): 2.42 cm    SHUNTS MV Decel Time: 313 msec    Systemic VTI:  0.26 m MV E velocity: 58.80 cm/s  Systemic Diam: 2.40 cm MV A velocity: 69.50  cm/s MV E/A ratio:  0.85  Oneil Parchment MD Electronically signed by Oneil Parchment MD Signature Date/Time: 01/17/2023/2:45:55 PM    Final        CARDIAC MRI  MR CARDIAC MORPHOLOGY W WO CONTRAST 10/22/2022  Addendum 12/24/2022  8:11 AM ADDENDUM REPORT: 12/24/2022 08:08  ADDENDUM: Reviewing case for potential surgical planning. Though there is heavy breath hold artifact on aortic valve CINE, there is evidence of a small raphe. 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   Electronically Signed By: Stanly Leavens M.D. On: 12/24/2022 08:08  Narrative CLINICAL DATA:  Clinical question of aortopathy and aortic regurgitation Study assumes HCT of 37 and BSA of 2.53 m2.  EXAM: CARDIAC MRI  TECHNIQUE: The patient was scanned on a 1.5 Tesla GE magnet. A dedicated cardiac coil was used. Functional imaging was done using Fiesta sequences. 2,3, and 4 chamber views were done to assess for RWMA's. Modified Simpson's rule using a short axis stack was used to calculate an ejection fraction on a dedicated work Research officer, trade union. The patient received 10 cc of Gadavist . After 10 minutes inversion recovery sequences were used to assess for infiltration and scar tissue. Flow quantification was performed 2 times during this examination with flow quantification performed at the levels of the ascending aorta above the valve, pulmonary artery above the valve.  CONTRAST:  10 cc  of Gadavist   FINDINGS: 1. Mild left ventricular dilation, with LVEDD 68 mm, but LVEDVi 99 mL/m2.  Normal left ventricular thickness, with intraventricular septal thickness of 11 mm, posterior wall thickness of 9 mm, and myocardial mass index of 62 g/m2.  Normal left ventricular systolic function (LVEF =63%). There are no regional wall motion abnormalities.  Left ventricular parametric mapping notable for normal T2.  There is mid  inferior ECV elevation (43%)  There is late gadolinium enhancement in the left ventricular myocardium: There is basal inferoseptal LGE (inferior RV insertion site LGE) of unclear significance.  There is also mid inferior transmural LGE.  2.  Dilated right ventricular  size with RVEDVI 91 mL/m2.  Normal right ventricular thickness.  Normal right ventricular systolic function (RVEF =59%). There are no regional wall motion abnormalities or aneurysms.  3.  Normal left size.  Right atrial dilation with maximal volume 48 ml/m2.  4.  Mild pulmonary artery dilation.  Aortic root is severely dilated 52 mm at sinus to sinus evaluation.  Ascending aorta is moderately dilated in the ascending aorta, 49 mm.  There is no evidence of aortic dissection.  5. Valve assessment:  Aortic Valve: Thickening of the leaflet tips; tri-leaflet valve. There is moderate aortic regurgitation. Regurgitant fraction 22%.  Pulmonic Valve: There is no significant regurgitation. Regurgitant fraction < 1%.  Tricuspid Valve: There is mild tricuspid regurgitation Regurgitant fraction 15%.  Mitral Valve: There is no significant regurgitation. Regurgitant fraction 4%.  6.  Normal pericardium.  No pericardial effusion.  7. Grossly, no extracardiac findings. Recommended dedicated study if concerned for non-cardiac pathology.  8.  Breathhold artifact.  Much of study is done free breathing.  IMPRESSION: Severe aortic root dilation.  See MRA for more details.  There is inferior LGE. Given two discrete areas of LGE, clinical correlation for inflammatory disease.  Stanly Leavens MD  Electronically Signed: By: Stanly Leavens M.D. On: 10/22/2022 22:30   ______________________________________________________________________________________________       Recent Labs: 01/16/2024: ALT 19; BUN 13; Creatinine, Ser 1.22; Hemoglobin 13.1; Platelets 227.0; Potassium 3.8; Sodium 140; TSH 1.10   Recent Lipid Panel    Component Value Date/Time   CHOL 160 01/16/2024 1152   TRIG 88.0 01/16/2024 1152   HDL 43.00 01/16/2024 1152   CHOLHDL 4 01/16/2024 1152   VLDL 17.6 01/16/2024 1152   LDLCALC 100 (H) 01/16/2024 1152   LDLCALC 100 (H) 06/09/2020 1613   LDLDIRECT 71.0 02/05/2023 1716    Physical Exam:    VS:  BP 138/80 (BP Location: Left Arm)   Pulse 62   Ht 6' 3 (1.905 m)   Wt 267 lb (121.1 kg)   SpO2 98%   BMI 33.37 kg/m     Wt Readings from Last 3 Encounters:  02/24/24 267 lb (121.1 kg)  01/16/24 267 lb (121.1 kg)  07/16/23 259 lb 9.6 oz (117.8 kg)    GEN:  Well nourished, well developed in no acute distress HEENT: Normal NECK: No JVD CARDIAC: RRR, no rubs, gallops; diastolic murmur noted  RESPIRATORY:  Clear to auscultation without rales, wheezing or rhonchi  ABDOMEN: Soft, non-tender, non-distended MUSCULOSKELETAL:  No edema; No deformity  SKIN: Warm and dry NEUROLOGIC:  Alert and oriented x 3 PSYCHIATRIC:  Normal affect   ASSESSMENT:    1. Coronary artery calcification   2. Primary hypertension   3. Aortic ectasia (HCC)   4. Precordial pain   5. Nonrheumatic aortic valve insufficiency      PLAN:     Thoracic aortic aneurysm Thoracic aortic aneurysm is well-managed with no acute changes on the last CT scan from June 19, 2023. Associated with a bicuspid aortic valve and thoracic aortopathy. Intermittent atypical chest pain is likely musculoskeletal, possibly related to neck and shoulder issues, not consistent with aortic dissection. 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 Risk of progression to severe aortic regurgitation or aneurysm expansion, potentially  requiring surgical intervention. -  Coordinate care with Dr. Lucas and ensure follow-up with him - Monitor for progression of aortic regurgitation and aneurysm size  Moderate aortic regurgitation Moderate aortic regurgitation suspected based on clinical findings. Monitored yearly for progression to severe regurgitation. Symptoms include shortness of breath on exertion, but no severe symptoms indicating immediate progression. - Order echocardiogram in approximately 3 months, contingent on insurance coverage - Monitor for symptoms of progression to severe aortic regurgitation  Atypical chest pain Intermittent atypical chest pain, described as muscle-like pain behind the shoulder blade, not associated with exertion. Not consistent with cardiac ischemia or aortic dissection. Likely related to musculoskeletal issues or stress-related factors. - Monitor symptoms and advise to seek immediate care if pain becomes severe or is associated with other concerning symptoms  Coronary artery calcification Incidental finding of coronary artery calcifications with no current symptoms suggestive of coronary artery disease. - Order CT coronary angiogram in approximately 3 months, contingent on insurance coverage      Stanly Leavens, MD FASE [facility] Cardiologist [facility]  [address] [place], [place] [address] [phone]  12:44 PM

## 2024-02-24 NOTE — Patient Instructions (Addendum)
 Medication Instructions:  Your physician recommends that you continue on your current medications as directed. Please refer to the Current Medication list given to you today.  *If you need a refill on your cardiac medications before your next appointment, please call your pharmacy*  Lab Work: NONE  If you have labs (blood work) drawn today and your tests are completely normal, you will receive your results only by: MyChart Message (if you have MyChart) OR A paper copy in the mail If you have any lab test that is abnormal or we need to change your treatment, we will call you to review the results.  Testing/Procedures: IN OCT 2025 - - -Your physician has requested that you have an echocardiogram. Echocardiography is a painless test that uses sound waves to create images of your heart. It provides your doctor with information about the size and shape of your heart and how well your heart's chambers and valves are working. This procedure takes approximately one hour. There are no restrictions for this procedure. Please do NOT wear cologne, perfume, aftershave, or lotions (deodorant is allowed). Please arrive 15 minutes prior to your appointment time.  Please note: We ask at that you not bring children with you during ultrasound (echo/ vascular) testing. Due to room size and safety concerns, children are not allowed in the ultrasound rooms during exams. Our front office staff cannot provide observation of children in our lobby area while testing is being conducted. An adult accompanying a patient to their appointment will only be allowed in the ultrasound room at the discretion of the ultrasound technician under special circumstances. We apologize for any inconvenience.   IN OCT 2025- - - Your physician has requested that you have cardiac CT. Cardiac computed tomography (CT) is a painless test that uses an x-ray machine to take clear, detailed pictures of your heart. For further information please  visit [email or web address] Please follow instruction sheet as given.    Follow-Up: At [facility], you and your health needs are our priority.  As part of our continuing mission to provide you with exceptional heart care, our providers are all part of one team.  This team includes your primary Cardiologist (physician) and Advanced Practice Providers or APPs (Physician Assistants and Nurse Practitioners) who all work together to provide you with the care you need, when you need it.  Your next appointment:   5 month(s)  Provider:   Stanly DELENA Leavens, MD      Other Instructions  Your cardiac CT will be scheduled at one of the below locations:   [facility] [address] [place], [place] [address] [phone]  OR  [facility] [address] [place], [place] [address] [phone]  OR   [facility] [address] [place], [place] [address] [phone]  OR   [facility] [address] [place], [place] [address] [phone]  OR   [facility] [address]  [place], [place] [address]  If scheduled at [facility], please arrive at the [facility] and Children's Entrance (Entrance C2) of [facility] 30 minutes prior to test start time. You can use the FREE valet parking offered at entrance C (encouraged to control the heart rate for the test)  Proceed to the [facility] Radiology Department (first floor) to check-in and test prep.   All radiology patients and guests should use entrance C2 at [facility], accessed from [facility], even though the hospital's physical address listed is [address]    If scheduled at the Heart and Vascular Tower at [address], please enter the parking lot using the [address] entrance and use the FREE valet service at the patient drop-off  area. Enter the buidling and check-in with registration on the main floor.  If scheduled at [facility] or [facility], please arrive 15 mins early for check-in and test prep.  There is spacious parking and easy access to the radiology department from the [facility] Heart and Vascular entrance. Please enter here and check-in with the desk attendant.   If scheduled at [facility], please arrive 30 minutes early for check-in and test prep.  Please follow these instructions carefully (unless otherwise directed):  An IV will be required for this test and Nitroglycerin will be given.  Hold all erectile dysfunction medications at least 3 days (72 hrs) prior to test. (Ie viagra, cialis, sildenafil, tadalafil, etc)   On the Night Before the Test: Be sure to Drink plenty of water. Do not consume any caffeinated/decaffeinated beverages or chocolate 12 hours prior to your test. Do not take any antihistamines 12 hours prior to your test.   On the Day of the Test: Drink plenty of water until 1 hour prior to the test. Do not eat any food 1 hour prior to test. You may take your regular medications prior to the test.  Take metoprolol (Lopressor) 50 mg two hours prior to test. If you take Furosemide/Hydrochlorothiazide /Spironolactone/Chlorthalidone, please HOLD on the morning of the test. Patients who wear a continuous glucose monitor MUST remove the device prior to scanning.        After the Test: Drink plenty of water. After receiving IV contrast, you may experience a mild flushed feeling. This is normal. On occasion, you may experience a mild rash up to 24 hours after the test. This is not dangerous. If this occurs, you can take Benadryl  25 mg, Zyrtec, Claritin , or Allegra and increase your fluid intake. (Patients taking Tikosyn should avoid Benadryl , and may take Zyrtec, Claritin , or Allegra) If you experience trouble breathing, this can be  serious. If it is severe call 911 IMMEDIATELY. If it is mild, please call our office.  We will call to schedule your test 2-4 weeks out understanding that some insurance companies will need an authorization prior to the service being performed.   For more information and frequently asked questions, please visit our website : [email or web address]  For non-scheduling related questions, please contact the cardiac imaging nurse navigator should you have any questions/concerns: Cardiac Imaging Nurse Navigators Direct Office Dial: [phone]   For scheduling needs, including cancellations and rescheduling, please call [place], [phone].

## 2024-02-25 ENCOUNTER — Other Ambulatory Visit: Payer: Self-pay

## 2024-04-20 ENCOUNTER — Encounter: Payer: Self-pay | Admitting: Hematology

## 2024-04-26 NOTE — Assessment & Plan Note (Signed)
 hgba1c acceptable, minimize simple carbs. Increase exercise as tolerated. Continue current meds

## 2024-04-26 NOTE — Assessment & Plan Note (Signed)
 Well controlled, no changes to meds. Encouraged heart healthy diet such as the DASH diet and exercise as tolerated.

## 2024-04-26 NOTE — Assessment & Plan Note (Signed)
Cardiology has recommended a LDL goal of <70 and patient is agreeable but they have recommended we institute therapy after his cancer surgery so we will hold off on adding Atorvastatin 10 mg tabs, 1 daily until after surgery. Maintain heart healthy diet such as Mediterranean, MIND or DASH diet, increase exercise, avoid trans fats, simple carbohydrates and processed foods, consider a krill or fish or flaxseed oil cap daily.   

## 2024-04-26 NOTE — Assessment & Plan Note (Signed)
 Low risk repeat in 6 months

## 2024-04-26 NOTE — Assessment & Plan Note (Signed)
 Encouraged DASH or MIND diet, decrease po intake and increase exercise as tolerated. Needs 7-8 hours of sleep nightly. Avoid trans fats, eat small, frequent meals every 4-5 hours with lean proteins, complex carbs and healthy fats. Minimize simple carbs, high fat foods and processed foods consider GLP1

## 2024-04-27 ENCOUNTER — Encounter: Payer: Self-pay | Admitting: Hematology

## 2024-04-27 ENCOUNTER — Ambulatory Visit: Payer: Self-pay | Admitting: Family Medicine

## 2024-04-27 ENCOUNTER — Ambulatory Visit: Admitting: Family Medicine

## 2024-04-27 VITALS — HR 60 | Resp 16 | Ht 75.0 in | Wt 259.6 lb

## 2024-04-27 DIAGNOSIS — E1129 Type 2 diabetes mellitus with other diabetic kidney complication: Secondary | ICD-10-CM

## 2024-04-27 DIAGNOSIS — E669 Obesity, unspecified: Secondary | ICD-10-CM

## 2024-04-27 DIAGNOSIS — I10 Essential (primary) hypertension: Secondary | ICD-10-CM

## 2024-04-27 DIAGNOSIS — E1169 Type 2 diabetes mellitus with other specified complication: Secondary | ICD-10-CM

## 2024-04-27 DIAGNOSIS — E785 Hyperlipidemia, unspecified: Secondary | ICD-10-CM

## 2024-04-27 DIAGNOSIS — E1142 Type 2 diabetes mellitus with diabetic polyneuropathy: Secondary | ICD-10-CM

## 2024-04-27 LAB — CBC WITH DIFFERENTIAL/PLATELET
Basophils Absolute: 0.1 K/uL (ref 0.0–0.1)
Basophils Relative: 1.5 % (ref 0.0–3.0)
Eosinophils Absolute: 0.4 K/uL (ref 0.0–0.7)
Eosinophils Relative: 7 % — ABNORMAL HIGH (ref 0.0–5.0)
HCT: 43.5 % (ref 39.0–52.0)
Hemoglobin: 13.9 g/dL (ref 13.0–17.0)
Lymphocytes Relative: 36.8 % (ref 12.0–46.0)
Lymphs Abs: 1.9 K/uL (ref 0.7–4.0)
MCHC: 31.9 g/dL (ref 30.0–36.0)
MCV: 78.3 fl (ref 78.0–100.0)
Monocytes Absolute: 0.4 K/uL (ref 0.1–1.0)
Monocytes Relative: 8.2 % (ref 3.0–12.0)
Neutro Abs: 2.4 K/uL (ref 1.4–7.7)
Neutrophils Relative %: 46.5 % (ref 43.0–77.0)
Platelets: 205 K/uL (ref 150.0–400.0)
RBC: 5.56 Mil/uL (ref 4.22–5.81)
RDW: 17.6 % — ABNORMAL HIGH (ref 11.5–15.5)
WBC: 5.2 K/uL (ref 4.0–10.5)

## 2024-04-27 LAB — LIPID PANEL
Cholesterol: 172 mg/dL (ref 0–200)
HDL: 42.7 mg/dL (ref >39.00)
LDL Cholesterol: 110 mg/dL — ABNORMAL HIGH (ref 0–99)
NonHDL: 129.42
Total CHOL/HDL Ratio: 4
Triglycerides: 99 mg/dL (ref 0.0–149.0)
VLDL: 19.8 mg/dL (ref 0.0–40.0)

## 2024-04-27 LAB — COMPREHENSIVE METABOLIC PANEL WITH GFR
ALT: 19 U/L (ref 0–53)
AST: 22 U/L (ref 0–37)
Albumin: 4.7 g/dL (ref 3.5–5.2)
Alkaline Phosphatase: 73 U/L (ref 39–117)
BUN: 15 mg/dL (ref 6–23)
CO2: 29 meq/L (ref 19–32)
Calcium: 10 mg/dL (ref 8.4–10.5)
Chloride: 103 meq/L (ref 96–112)
Creatinine, Ser: 1.22 mg/dL (ref 0.40–1.50)
GFR: 66.9 mL/min (ref >60.00)
Glucose, Bld: 145 mg/dL — ABNORMAL HIGH (ref 70–99)
Potassium: 4.5 meq/L (ref 3.5–5.1)
Sodium: 140 meq/L (ref 135–145)
Total Bilirubin: 0.9 mg/dL (ref 0.2–1.2)
Total Protein: 7.2 g/dL (ref 6.0–8.3)

## 2024-04-27 LAB — HEMOGLOBIN A1C: Hgb A1c MFr Bld: 7.5 % — ABNORMAL HIGH (ref 4.6–6.5)

## 2024-04-27 LAB — TSH: TSH: 0.62 u[IU]/mL (ref 0.35–5.50)

## 2024-04-27 MED ORDER — MECLIZINE HCL 25 MG PO TABS: 12.5000 mg | ORAL_TABLET | Freq: Three times a day (TID) | ORAL | 0 refills | Status: AC | PRN

## 2024-04-27 NOTE — Patient Instructions (Addendum)
 Annual flu and covid shot Shingrix is the new shingles shot, 2 shots over 2-6 months, confirm coverage with insurance and document, then can return here for shots with nurse appt or at pharmacy  Prevnar 20 vaccine once RSV, Respiratory Syncitial Virus Vaccine, Arexvy   Can get all at [facility] pharmacies they do walk in vaccines M-F 9-4  Diabetes: Carbohydrate Counting for Adults Carbohydrate counting is a method of keeping track of how many carbohydrates you eat. Eating carbohydrates increases the amount of sugar, also called glucose, in your blood. By counting how many carbohydrates you eat, you can improve how well you manage your blood sugar. This, in turn, helps you manage your diabetes. Carbohydrates are measured in grams (g) per serving. It's important to know how many carbohydrates (in grams or by serving size) you can have in each meal. This is different for every person. A dietitian can help you make a meal plan and calculate how many carbohydrates you should have at each meal and snack. What foods contain carbohydrates? Carbohydrates are found in these foods: Grains, such as breads and cereals. Dried beans and soy products. Starchy vegetables, such as potatoes, peas, and corn. Fruit and fruit juices. Milk and yogurt. Sweets and snack foods like cake, cookies, candy, chips, and soft drinks. How do I count carbohydrates in foods? There are two ways to count carbohydrates in food. You can read food labels or learn standard serving sizes of foods. You can use either of these methods or a combination of both. Using the Nutrition Facts label The Nutrition Facts list is included on the labels of almost all packaged foods and drinks in the [place] It includes: The serving size. Information about nutrients in each serving. This includes the grams of carbohydrate per serving. To use the Nutrition Facts, decide how many servings you will have. Then, multiply the number of servings by the number of  carbohydrates per serving. The resulting number is the total grams of carbohydrates that you'll be having. Learning the standard serving sizes of foods When you eat carbohydrate foods that aren't packaged or don't include Nutrition Facts on the label, you need to measure the servings in order to count the grams of carbohydrates. Measure the foods that you'll eat with a food scale or measuring cup, if needed. Decide how many standard-size servings you'll eat. Multiply the number of servings by 15. For foods that contain carbohydrates, one serving equals 15 g of carbohydrates. For example, if you eat 2 cups or 10 oz (300 g) of strawberries, you'll have eaten 2 servings and 30 g of carbohydrates (2 servings x 15 g = 30 g). For foods that have more than one food mixed, such as soups and casseroles, you must count the carbohydrates in each food that's included. Here's a list of standard serving sizes for common carbohydrate-rich foods. Each of these servings has about 15 g of carbohydrates: 1 slice of bread. 1 six-inch (15 cm) tortilla. ? cup or 2 oz (53 g) of cooked rice or pasta.  cup or 3 oz (85 g) of cooked or canned, drained, and rinsed beans or lentils.  cup or 3 oz (85 g) of a starchy vegetable, such as peas, corn, or squash.  cup or 4 oz (120 g) of hot cereal.  cup or 3 oz (85 g) of boiled or mashed potatoes, or  or 3 oz (85 g) of a large baked potato.  cup or 4 fl oz (118 mL) of fruit juice. 1 cup  or 8 fl oz (237 mL) of milk. 1 small or 4 oz (106 g) apple.  or 2 oz (63 g) of a medium banana. 1 cup or 5 oz (150 g) of strawberries. 3 cups or 1 oz (28.3 g) of popped popcorn. What is an example of carbohydrate counting? To calculate the grams of carbohydrates in this sample meal, follow the steps below. Sample meal 3 oz (85 g) chicken breast. ? cup or 4 oz (106 g) of brown rice.  cup or 3 oz (85 g) of corn. 1 cup or 8 fl oz (237 mL) of milk. 1 cup or 5 oz (150 g) of strawberries  with sugar-free whipped topping. Carbohydrate calculation Identify the foods that have carbohydrates: Rice. Corn. Milk. Strawberries. Calculate how many servings you have of each food: 2 servings of rice. 1 serving of corn. 1 serving of milk. 1 serving of strawberries. Multiply each number of servings by 15 g: 2 servings of rice x 15 g = 30 g. 1 serving of corn x 15 g = 15 g. 1 serving of milk x 15 g = 15 g. 1 serving of strawberries x 15 g = 15 g. Add together all of the amounts to find the total grams of carbohydrates eaten: 30 g + 15 g + 15 g + 15 g = 75 g of carbohydrates total. Where to find more information To learn more, go to: American Diabetes Association at diabetes.org. Click Search and type carb counting. Find the link you need. Centers for Disease Control and Prevention at TonerPromos.no. Click Search and type diabetes. Find the link you need. Academy of Nutrition and Dietetics: eatright.org This information is not intended to replace advice given to you by your health care provider. Make sure you discuss any questions you have with your health care provider. Document Revised: 07/17/2023 Document Reviewed: 07/17/2023 Elsevier Patient Education  2025 [employer].

## 2024-04-28 ENCOUNTER — Encounter: Payer: Self-pay | Admitting: Family Medicine

## 2024-04-28 NOTE — Progress Notes (Signed)
 Subjective:    Patient ID: Robert [name], male    DOB: April 09, 1971, 53 y.o.   MRN: [id number]  Chief Complaint  Patient presents with   Medical Management of Chronic Issues    Patient presents today for a 3 month follow-up.   Quality Metric Gaps    TDAP, pneumococcal, Hep B vaccines    HPI Discussed the use of AI scribe software for clinical note transcription with the patient, who gave verbal consent to proceed.  History of Present Illness Robert [name] is a 53 year old male with diabetes who presents with medication management issues due to insurance changes.  He is experiencing difficulties managing his diabetes due to changes in his insurance plan, leading to a three-week lapse in all diabetes medications. Previously, he was taking Jardiance  and Onglyza but stopped due to high costs under his new insurance plan. He has not been checking his blood sugar levels regularly but has not noticed any symptoms of hyperglycemia. He is concerned about gastrointestinal side effects from metformin  due to a history of colitis.  He reports a recent colitis flare-up, which occurred without any identifiable dietary triggers. He has a history of bowel issues and manages his bowel symptoms with fiber supplements, taking one capsule daily, and adjusting based on how he feels. He experiences issues with certain foods, such as green beans and fresh fruit, which can cause gastrointestinal distress.  He has been experiencing episodes of vertigo and moments where he feels cross-eyed, which sometimes lead to nausea and cold sweats. These episodes have occurred with increasing frequency, with one instance lasting two days. He has not yet discussed these symptoms with his eye doctor. He has a history of congestion and allergies, which are worse in the fall.  He is scheduled for an echocardiogram in October to monitor an aneurysm and to assess for a possible bicuspid aortic valve. The aneurysm was incidentally  discovered during an abdominal CT scan. He has a history of an aneurysm and is under surveillance for potential changes.  He recently changed jobs, resulting in a more manageable stress level and better work schedule, allowing for improved meal planning and healthier food choices. He works at [employer], which provides access to healthier food options compared to his previous job at [employer].    Past Medical History:  Diagnosis Date   Anemia    Arthralgia 01/11/2013   Diffuse, b/l Hips R>L Knees, ankles, low back   Arthritis    Back pain 12/08/2015   Cancer (HCC)    Coronary artery disease    Diabetes mellitus type 2 in obese 11/09/2012   Dyslipidemia 11/09/2012   Esophageal reflux 12/08/2015   Feeling grief 06/09/2020   History of migraine headaches    Hyperglycemia 11/09/2012   Hypertension    Neuropathy    hands and feet    Past Surgical History:  Procedure Laterality Date   DIVERTING ILEOSTOMY N/A 08/23/2022   Procedure: DIVERTING LOOP ILEOSTOMY;  Surgeon: Teresa Lonni HERO, MD;  Location: [facility];  Service: General;  Laterality: N/A;   EUS N/A 06/21/2022   Procedure: LOWER ENDOSCOPIC ULTRASOUND (EUS);  Surgeon: Wilhelmenia Aloha Raddle., MD;  Location: [facility];  Service: Gastroenterology;  Laterality: N/A;   FLEXIBLE SIGMOIDOSCOPY N/A 06/21/2022   Procedure: FLEXIBLE SIGMOIDOSCOPY;  Surgeon: Wilhelmenia Aloha Raddle., MD;  Location: [facility];  Service: Gastroenterology;  Laterality: N/A;   FLEXIBLE SIGMOIDOSCOPY N/A 08/23/2022   Procedure: FLEXIBLE SIGMOIDOSCOPY;  Surgeon: Teresa Lonni HERO, MD;  Location: [facility];  Service: General;  Laterality: N/A;   FLEXIBLE SIGMOIDOSCOPY N/A 03/20/2023   Procedure: DIAGNOSTIC FLEXIBLE SIGMOIDOSCOPY;  Surgeon: Teresa Lonni HERO, MD;  Location: [facility];  Service: General;  Laterality: N/A;   ILEOSTOMY CLOSURE N/A 03/20/2023   Procedure: TAKEDOWN OF DIVERTING LOOP ILEOSTOMY;  Surgeon: Teresa Lonni HERO, MD;   Location: [facility];  Service: General;  Laterality: N/A;   PORT-A-CATH REMOVAL N/A 05/03/2023   Procedure: REMOVAL PORT-A-CATH;  Surgeon: Teresa Lonni HERO, MD;  Location: [facility];  Service: General;  Laterality: N/A;   PORTACATH PLACEMENT N/A 09/21/2022   Procedure: INSERTION PORT-A-CATH WITH ULTRASOUND GUIDANCE;  Surgeon: Teresa Lonni HERO, MD;  Location: [facility];  Service: General;  Laterality: N/A;   XI ROBOTIC ASSISTED LOWER ANTERIOR RESECTION N/A 08/23/2022   Procedure: XI ROBOTIC ASSISTED LOWER ANTERIOR RESECTION with Introperative assessment of profusion and TAP Block;  Surgeon: Teresa Lonni HERO, MD;  Location: [facility];  Service: General;  Laterality: N/A;    Family History  Problem Relation Age of Onset   Arthritis Mother    Hypertension Mother    Diabetes Mother    Cancer Mother    Arthritis Father    Hypertension Father    Diabetes Father    Sleep apnea Father    Obesity Father    Barrett's esophagus Father    Hyperlipidemia Brother    Hypertension Brother    Diabetes Brother    Arthritis Maternal Grandmother    Diabetes Maternal Grandmother    Stroke Maternal Grandmother    COPD Maternal Grandfather    Arthritis Paternal Grandmother    Diabetes Paternal Grandmother    Cancer Paternal Grandmother        lung, smoker   Diabetes Paternal Grandfather    Heart disease Paternal Grandfather 63       MI   Kidney disease Neg Hx     Social History   Socioeconomic History   Marital status: Married    Spouse name: Not on file   Number of children: 4   Years of education: Not on file   Highest education level: Bachelor's degree (e.g., BA, AB, BS)  Occupational History   Occupation: Community education officer: [employer]  Tobacco Use   Smoking status: Former    Current packs/day: 0.00    Average packs/day: 1.5 packs/day for 10.0 years (15.0 ttl pk-yrs)    Types: Cigarettes    Start date: 08/13/1985    Quit date: 08/14/1995    Years since quitting: 28.7    Smokeless tobacco: Never  Vaping Use   Vaping status: Never Used  Substance and Sexual Activity   Alcohol  use: Yes    Alcohol /week: 5.0 standard drinks of alcohol     Types: 5 Cans of beer per week   Drug use: Not Currently    Types: Marijuana    Comment: last use 02/2023   Sexual activity: Yes    Comment: lives with wife and daughter, no dietary restrictions  Other Topics Concern   Not on file  Social History Narrative   Regular exercise:  Walks 1-2 x weekly   Caffeine use:  3-4 daily   4 children- oldest first marriage 53 (son), 32 son, 10 son, 23 yr old girl   Married   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[employer] Drivers of [employer]: Low Risk  (04/27/2024)  Overall Financial Resource Strain (CARDIA)    Difficulty of Paying Living Expenses: Not very hard  Food Insecurity: No Food Insecurity (04/27/2024)   Hunger Vital Sign    Worried About Running Out of Food in the Last Year: Never true    Ran Out of Food in the Last Year: Never true  Transportation Needs: No Transportation Needs (04/27/2024)   PRAPARE - Administrator, Civil Service (Medical): No    Lack of Transportation (Non-Medical): No  Physical Activity: Insufficiently Active (04/27/2024)   Exercise Vital Sign    Days of Exercise per Week: 1 day    Minutes of Exercise per Session: 30 min  Stress: Stress Concern Present (04/27/2024)   [employer] of Occupational Health - Occupational Stress Questionnaire    Feeling of Stress: To some extent  Social Connections: Moderately Integrated (04/27/2024)   Social Connection and Isolation Panel    Frequency of Communication with Friends and Family: Twice a week    Frequency of Social Gatherings with Friends and Family: Once a week    Attends Religious Services: More than 4 times per year    Active Member of [employer] or Organizations: No    Attends Banker Meetings: Not on file    Marital Status:  Married  Catering manager Violence: Not At Risk (03/20/2023)   Humiliation, Afraid, Rape, and Kick questionnaire    Fear of Current or Ex-Partner: No    Emotionally Abused: No    Physically Abused: No    Sexually Abused: No    Outpatient Medications Prior to Visit  Medication Sig Dispense Refill   amLODipine  (NORVASC ) 10 MG tablet Take 1 tablet (10 mg total) by mouth daily. 90 tablet 1   carvedilol  (COREG ) 25 MG tablet TAKE 1 TABLET BY MOUTH TWICE A DAY 180 tablet 3   empagliflozin  (JARDIANCE ) 10 MG TABS tablet Take 1 tablet (10 mg total) by mouth daily before breakfast. 90 tablet 3   famotidine  (PEPCID ) 40 MG tablet Take 1 tablet (40 mg total) by mouth at bedtime. (Patient taking differently: Take 40 mg by mouth at bedtime as needed for heartburn or indigestion.) 30 tablet 3   gabapentin  (NEURONTIN ) 100 MG capsule TAKE ONE CAPSULE BY MOUTH IN THE MORNING AND 3 CAPSULES AT NIGHT 120 capsule 2   glucose blood (COOL BLOOD GLUCOSE TEST STRIPS) test strip Check blood sugar once daily 100 each 12   loperamide  (IMODIUM ) 2 MG capsule Take 2 mg by mouth as needed for diarrhea or loose stools.     pantoprazole  (PROTONIX ) 40 MG tablet TAKE 1 TABLET BY MOUTH EVERY DAY *ONLY COVERS 90 DAYS 90 tablet 1   saxagliptin  HCl (ONGLYZA) 5 MG TABS tablet Take 1 tablet (5 mg total) by mouth daily. 90 tablet 1   tiZANidine  (ZANAFLEX ) 2 MG tablet Take 1-2 tablets (2-4 mg total) by mouth 3 (three) times daily as needed for muscle spasms. 60 tablet 2   No facility-administered medications prior to visit.    Allergies  Allergen Reactions   Advil [Ibuprofen] Shortness Of Breath   Aleve [Naproxen] Shortness Of Breath   Mucinex [Guaifenesin Er] Shortness Of Breath, Itching, Swelling, Dermatitis, Rash and Other (See Comments)    Swelling of hands and feet   Nsaids Shortness Of Breath, Itching, Swelling, Rash and Other (See Comments)    Aleve and Advil    Levaquin [Levofloxacin]     Aortic aneurysm; not recommend in  this condition unless no other agents would cover  Latex Rash and Other (See Comments)    Cartilage will harden    Review of Systems  Constitutional:  Positive for malaise/fatigue. Negative for fever.  HENT:  Negative for congestion.   Eyes:  Negative for blurred vision.  Respiratory:  Negative for shortness of breath.   Cardiovascular:  Negative for chest pain, palpitations and leg swelling.  Gastrointestinal:  Negative for abdominal pain, blood in stool and nausea.  Genitourinary:  Negative for dysuria and frequency.  Musculoskeletal:  Negative for falls.  Skin:  Negative for rash.  Neurological:  Positive for dizziness. Negative for loss of consciousness and headaches.  Endo/Heme/Allergies:  Negative for environmental allergies.  Psychiatric/Behavioral:  Negative for depression. The patient is not nervous/anxious.        Objective:    Physical Exam Vitals reviewed.  Constitutional:      Appearance: Normal appearance. He is not ill-appearing.  HENT:     Head: Normocephalic and atraumatic.     Nose: Nose normal.  Eyes:     Conjunctiva/sclera: Conjunctivae normal.  Cardiovascular:     Rate and Rhythm: Normal rate.     Pulses: Normal pulses.     Heart sounds: Normal heart sounds. No murmur heard. Pulmonary:     Effort: Pulmonary effort is normal.     Breath sounds: Normal breath sounds. No wheezing.  Abdominal:     Palpations: Abdomen is soft. There is no mass.     Tenderness: There is no abdominal tenderness.  Musculoskeletal:     Cervical back: Normal range of motion.     Right lower leg: No edema.     Left lower leg: No edema.  Skin:    General: Skin is warm and dry.  Neurological:     General: No focal deficit present.     Mental Status: He is alert and oriented to person, place, and time.  Psychiatric:        Mood and Affect: Mood normal.     Pulse 60   Resp 16   Ht 6' 3 (1.905 m)   Wt 259 lb 9.6 oz (117.8 kg)   SpO2 99%   BMI 32.45 kg/m  Wt  Readings from Last 3 Encounters:  04/27/24 259 lb 9.6 oz (117.8 kg)  02/24/24 267 lb (121.1 kg)  01/16/24 267 lb (121.1 kg)    Diabetic Foot Exam - Simple   No data filed    Lab Results  Component Value Date   WBC 5.2 04/27/2024   HGB 13.9 04/27/2024   HCT 43.5 04/27/2024   PLT 205.0 04/27/2024   GLUCOSE 145 (H) 04/27/2024   CHOL 172 04/27/2024   TRIG 99.0 04/27/2024   HDL 42.70 04/27/2024   LDLDIRECT 71.0 02/05/2023   LDLCALC 110 (H) 04/27/2024   ALT 19 04/27/2024   AST 22 04/27/2024   NA 140 04/27/2024   K 4.5 04/27/2024   CL 103 04/27/2024   CREATININE 1.22 04/27/2024   BUN 15 04/27/2024   CO2 29 04/27/2024   TSH 0.62 04/27/2024   PSA 0.55 07/16/2023   HGBA1C 7.5 (H) 04/27/2024   MICROALBUR 0.9 01/16/2024    Lab Results  Component Value Date   TSH 0.62 04/27/2024   Lab Results  Component Value Date   WBC 5.2 04/27/2024   HGB 13.9 04/27/2024   HCT 43.5 04/27/2024   MCV 78.3 04/27/2024   PLT 205.0 04/27/2024   Lab Results  Component Value Date   NA 140 04/27/2024   K 4.5 04/27/2024  CO2 29 04/27/2024   GLUCOSE 145 (H) 04/27/2024   BUN 15 04/27/2024   CREATININE 1.22 04/27/2024   BILITOT 0.9 04/27/2024   ALKPHOS 73 04/27/2024   AST 22 04/27/2024   ALT 19 04/27/2024   PROT 7.2 04/27/2024   ALBUMIN 4.7 04/27/2024   CALCIUM  10.0 04/27/2024   ANIONGAP 6 06/18/2023   GFR 66.90 04/27/2024   Lab Results  Component Value Date   CHOL 172 04/27/2024   Lab Results  Component Value Date   HDL 42.70 04/27/2024   Lab Results  Component Value Date   LDLCALC 110 (H) 04/27/2024   Lab Results  Component Value Date   TRIG 99.0 04/27/2024   Lab Results  Component Value Date   CHOLHDL 4 04/27/2024   Lab Results  Component Value Date   HGBA1C 7.5 (H) 04/27/2024       Assessment & Plan:  Type 2 diabetes mellitus with other diabetic kidney complication, without long-term current use of insulin  (HCC) Assessment & Plan: Low risk repeat in 6 months    Orders: -     Hemoglobin A1c  Primary hypertension Assessment & Plan: Well controlled, no changes to meds. Encouraged heart healthy diet such as the DASH diet and exercise as tolerated.    Orders: -     Comprehensive metabolic panel with GFR -     CBC with Differential/Platelet  Hyperlipidemia LDL goal <70 Assessment & Plan: Cardiology has recommended a LDL goal of <70 and patient is agreeable but they have recommended we institute therapy after his cancer surgery so we will hold off on adding Atorvastatin  10 mg tabs, 1 daily until after surgery. Maintain heart healthy diet such as Mediterranean, MIND or DASH diet, increase exercise, avoid trans fats, simple carbohydrates and processed foods, consider a krill or fish or flaxseed oil cap daily.    Orders: -     TSH -     Lipid panel  Type 2 diabetes mellitus with diabetic polyneuropathy, without long-term current use of insulin  (HCC) Assessment & Plan: hgba1c acceptable, minimize simple carbs. Increase exercise as tolerated. Continue current meds    Type 2 diabetes mellitus with obesity (HCC) Assessment & Plan: Encouraged DASH or MIND diet, decrease po intake and increase exercise as tolerated. Needs 7-8 hours of sleep nightly. Avoid trans fats, eat small, frequent meals every 4-5 hours with lean proteins, complex carbs and healthy fats. Minimize simple carbs, high fat foods and processed foods consider GLP1   Other orders -     Meclizine  HCl; Take 0.5-1 tablets (12.5-25 mg total) by mouth 3 (three) times daily as needed for dizziness.  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 Jardiance  and Onglyza are not affordable under current insurance plan. Metformin  is contraindicated  due to colitis. Current blood glucose levels are unknown, and A1c will be checked to assess control. Discussion of potential use of insulin  or sulfonylureas if necessary. Emphasis on the importance of monitoring blood glucose levels regularly to guide treatment decisions. He prefers to manage diabetes with oral medications before considering insulin . Actos  is considered as an alternative option. - Check A1c level. - Encourage regular blood glucose monitoring, including fasting and postprandial levels. - Consider insulin  or sulfonylureas if blood glucose control is inadequate. - Discuss potential referral to endocrinology  if diabetes management becomes challenging.  Colitis, post-surgical with recurrent flares Recent flare of colitis. Metformin  is contraindicated due to potential exacerbation of bowel issues. Current fiber supplementation is being adjusted to manage symptoms. He reports improvement with current fiber regimen but is cautious with dosage adjustments. - Continue current fiber supplementation, adjusting dosage as tolerated. - Consider Benefiber as an alternative fiber supplement if current regimen is not tolerated. - Monitor for dietary triggers and adjust diet as needed.  Vertigo, episodic Episodic vertigo with associated cross-eyed sensation and nausea. Possible dehydration as a contributing factor. Eye doctor has suggested further evaluation for potential blood flow issues affecting the optic nerve. Meclizine  discussed as a potential treatment for symptomatic relief. He experiences vertigo infrequently but with significant discomfort. - Encourage adequate hydration to prevent dehydration-related vertigo. -Has appt with eye doctor for further evaluation of potential blood flow issues affecting the optic nerve. - Prescribe meclizine  as needed for vertigo, with instructions to fill only if symptoms persist.  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 Upcoming echocardiogram scheduled to monitor aneurysm size and assess valve function. He is aware of the need for ongoing monitoring due to potential complications. - Proceed with scheduled echocardiogram in October to monitor aneurysm and valve function.  Recording duration: 28 minutes     Harlene Horton, MD

## 2024-05-01 MED ORDER — ATORVASTATIN CALCIUM 10 MG PO TABS: 10.0000 mg | ORAL_TABLET | Freq: Every day | ORAL | 3 refills | Status: DC

## 2024-05-01 MED ORDER — PIOGLITAZONE HCL 15 MG PO TABS: 15.0000 mg | ORAL_TABLET | Freq: Every day | ORAL | 3 refills | Status: AC

## 2024-05-25 ENCOUNTER — Encounter (HOSPITAL_COMMUNITY): Payer: Self-pay

## 2024-05-26 ENCOUNTER — Ambulatory Visit (HOSPITAL_COMMUNITY)
Admission: RE | Admit: 2024-05-26 | Discharge: 2024-05-26 | Disposition: A | Discharge status: Home / Self Care | Source: Ambulatory Visit | Attending: Internal Medicine | Admitting: Internal Medicine

## 2024-05-26 ENCOUNTER — Ambulatory Visit (HOSPITAL_COMMUNITY)
Admission: RE | Admit: 2024-05-26 | Discharge: 2024-05-26 | Disposition: A | Source: Ambulatory Visit | Attending: Internal Medicine | Admitting: Internal Medicine

## 2024-05-26 DIAGNOSIS — I351 Nonrheumatic aortic (valve) insufficiency: Secondary | ICD-10-CM | POA: Diagnosis not present

## 2024-05-26 DIAGNOSIS — R072 Precordial pain: Secondary | ICD-10-CM | POA: Diagnosis not present

## 2024-05-26 DIAGNOSIS — I77819 Aortic ectasia, unspecified site: Secondary | ICD-10-CM | POA: Insufficient documentation

## 2024-05-26 LAB — ECHOCARDIOGRAM COMPLETE
Area-P 1/2: 3.95 cm2
P 1/2 time: 729 ms
S' Lateral: 4.1 cm

## 2024-05-26 MED ORDER — NITROGLYCERIN 0.4 MG SL SUBL
0.8000 mg | SUBLINGUAL_TABLET | Freq: Once | SUBLINGUAL | Status: AC
  Administered 2024-05-26: 0.8 mg via SUBLINGUAL

## 2024-05-26 MED ORDER — IOHEXOL 350 MG/ML SOLN
100.0000 mL | Freq: Once | INTRAVENOUS | Status: AC | PRN
  Administered 2024-05-26: 100 mL via INTRAVENOUS

## 2024-05-26 NOTE — Progress Notes (Signed)
 Patient presented for cardiac CTA- IV in Left AC extravasated during contrast injection. Injection stopped.   Patient reports mild pain to site.   Mild edema noted, +2 radial pulse.  Patient denies numbness or tingling in extremity. IV removes, ice pack and pressure dressing applied.   Precautions discussed.  IV was restarted and study completed.  Dr. Pietro made aware.

## 2024-05-27 ENCOUNTER — Telehealth (HOSPITAL_COMMUNITY): Payer: Self-pay | Admitting: *Deleted

## 2024-05-27 NOTE — Telephone Encounter (Signed)
 Called patient to follow up on IV extravasation from yesterday during cardiac CTA.  Patient reports swelling has decreased, small bruising noted.  Denies redness or numbness/tingling.  Advised to call back with any questions/concerns.

## 2024-05-31 ENCOUNTER — Other Ambulatory Visit: Payer: Self-pay | Admitting: Family Medicine

## 2024-06-01 ENCOUNTER — Ambulatory Visit: Payer: Self-pay

## 2024-06-19 ENCOUNTER — Other Ambulatory Visit: Payer: Self-pay | Admitting: Internal Medicine

## 2024-07-20 ENCOUNTER — Ambulatory Visit: Attending: Internal Medicine | Admitting: Internal Medicine

## 2024-07-20 VITALS — BP 155/85 | HR 58 | Ht 75.0 in | Wt 272.0 lb

## 2024-07-20 DIAGNOSIS — I2510 Atherosclerotic heart disease of native coronary artery without angina pectoris: Secondary | ICD-10-CM

## 2024-07-20 DIAGNOSIS — I351 Nonrheumatic aortic (valve) insufficiency: Secondary | ICD-10-CM | POA: Diagnosis not present

## 2024-07-20 DIAGNOSIS — I7121 Aneurysm of the ascending aorta, without rupture: Secondary | ICD-10-CM

## 2024-07-20 MED ORDER — LOSARTAN POTASSIUM 25 MG PO TABS: 25.0000 mg | ORAL_TABLET | Freq: Every day | ORAL | 3 refills | Status: AC

## 2024-07-20 NOTE — Patient Instructions (Signed)
 Medication Instructions:  Your physician has recommended you make the following change in your medication:  START: Losartan  25 mg by mouth once daily Please monitor your Blood Pressure  *If you need a refill on your cardiac medications before your next appointment, please call your pharmacy*  Lab Work: Lipid Panel and Basic Metabolic Panel at any [facility]   If you have labs (blood work) drawn today and your tests are completely normal, you will receive your results only by: MyChart Message (if you have MyChart) OR A paper copy in the mail If you have any lab test that is abnormal or we need to change your treatment, we will call you to review the results.  Testing/Procedures: NONE  Follow-Up: At [facility], you and your health needs are our priority.  As part of our continuing mission to provide you with exceptional heart care, our providers are all part of one team.  This team includes your primary Cardiologist (physician) and Advanced Practice Providers or APPs (Physician Assistants and Nurse Practitioners) who all work together to provide you with the care you need, when you need it.  Your next appointment:   6 month(s)  Provider:   Stanly DELENA Leavens, MD or Jon Hails, PA-C, Lum Louis, NP, Lamarr Satterfield, NP, or Damien Braver, NP

## 2024-07-20 NOTE — Progress Notes (Signed)
 Cardiology Office Note:    Date:  07/20/2024   ID:  Adeel Guiffre, DOB 06-28-1971, MRN [id number]  PCP:  Domenica Harlene LABOR, MD   [facility] Providers Cardiologist:  Stanly LABOR Leavens, MD     Referring MD: Domenica Harlene LABOR, MD   CC: Aortopathy  History of Present Illness:    Robert [name] is a 53 y.o. male with a hx of HTN, HLD, DM, CAC.  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  He had new colorectal cancer. Found to have increase in aortic diameter.  Saw anesthesia leading to delay in surgery.  Vernadine sees Dr. Lucas as well. 2024: New AKI after chemo due to high output ileostomy. Dr. Lanny has reduced the overall dosage of chemotherapy. Surgery was performed with no issues 2025: New Job.  Gap coverage with insurance  Robert [name], a 53 year old male with thoracic aortic aneurysm and aortic regurgitation who presents with elevated blood pressure and breathing difficulties.  He has been experiencing episodes of waking up with a sensation of heavy breathing, without associated palpitations. He needs to take deeper breaths both during sleep and while awake. These symptoms have persisted for the last few weeks.  His blood pressure has been increasing, as noted during recent checks, including at a dental appointment. Despite taking carvedilol , his blood pressure remains elevated. He sometimes misses his evening dose of carvedilol  if he falls asleep early, but has been more consistent with it recently due to taking gabapentin  at night for neuropathy.  He has a history of thoracic aortic aneurysm and aortic regurgitation, with a CT scan scheduled for six months from October. Previous measurements of the aneurysm have varied, with recent measurements showing an increase.  He has a history of elevated blood sugars, which he describes as generally stable but with occasional dips. He does not smoke. He has coronary artery calcifications and hyperlipidemia.   Past Medical History:  Diagnosis Date    Anemia    Arthralgia 01/11/2013   Diffuse, b/l Hips R>L Knees, ankles, low back   Arthritis    Back pain 12/08/2015   Cancer (HCC)    Coronary artery disease    Diabetes mellitus type 2 in obese 11/09/2012   Dyslipidemia 11/09/2012   Esophageal reflux 12/08/2015   Feeling grief 06/09/2020   History of migraine headaches    Hyperglycemia 11/09/2012   Hypertension    Neuropathy    hands and feet    Past Surgical History:  Procedure Laterality Date   DIVERTING ILEOSTOMY N/A 08/23/2022   Procedure: DIVERTING LOOP ILEOSTOMY;  Surgeon: Teresa Lonni HERO, MD;  Location: [facility];  Service: General;  Laterality: N/A;   EUS N/A 06/21/2022   Procedure: LOWER ENDOSCOPIC ULTRASOUND (EUS);  Surgeon: Wilhelmenia Aloha Raddle., MD;  Location: [facility];  Service: Gastroenterology;  Laterality: N/A;   FLEXIBLE SIGMOIDOSCOPY N/A 06/21/2022   Procedure: FLEXIBLE SIGMOIDOSCOPY;  Surgeon: Wilhelmenia Aloha Raddle., MD;  Location: [facility];  Service: Gastroenterology;  Laterality: N/A;   FLEXIBLE SIGMOIDOSCOPY N/A 08/23/2022   Procedure: FLEXIBLE SIGMOIDOSCOPY;  Surgeon: Teresa Lonni HERO, MD;  Location: [facility];  Service: General;  Laterality: N/A;   FLEXIBLE SIGMOIDOSCOPY N/A 03/20/2023   Procedure: DIAGNOSTIC FLEXIBLE SIGMOIDOSCOPY;  Surgeon: Teresa Lonni HERO, MD;  Location: [facility];  Service: General;  Laterality: N/A;   ILEOSTOMY CLOSURE N/A 03/20/2023   Procedure: TAKEDOWN OF DIVERTING LOOP ILEOSTOMY;  Surgeon: Teresa Lonni HERO, MD;  Location: [facility];  Service: General;  Laterality: N/A;   PORT-A-CATH REMOVAL N/A  05/03/2023   Procedure: REMOVAL PORT-A-CATH;  Surgeon: Teresa Lonni HERO, MD;  Location: [facility];  Service: General;  Laterality: N/A;   PORTACATH PLACEMENT N/A 09/21/2022   Procedure: INSERTION PORT-A-CATH WITH ULTRASOUND GUIDANCE;  Surgeon: Teresa Lonni HERO, MD;  Location: [facility];  Service: General;  Laterality: N/A;   XI ROBOTIC ASSISTED LOWER ANTERIOR RESECTION N/A  08/23/2022   Procedure: XI ROBOTIC ASSISTED LOWER ANTERIOR RESECTION with Introperative assessment of profusion and TAP Block;  Surgeon: Teresa Lonni HERO, MD;  Location: [facility];  Service: General;  Laterality: N/A;    Current Medications: No outpatient medications have been marked as taking for the 07/20/24 encounter (Appointment) with Santo Stanly LABOR, MD.     Allergies:   Advil [ibuprofen], Aleve [naproxen], Mucinex [guaifenesin er], Nsaids, Levaquin [levofloxacin], and Latex   Social History   Socioeconomic History   Marital status: Married    Spouse name: Not on file   Number of children: 4   Years of education: Not on file   Highest education level: Bachelor's degree (e.g., BA, AB, BS)  Occupational History   Occupation: Community Education Officer: [employer]  Tobacco Use   Smoking status: Former    Current packs/day: 0.00    Average packs/day: 1.5 packs/day for 10.0 years (15.0 ttl pk-yrs)    Types: Cigarettes    Start date: 08/13/1985    Quit date: 08/14/1995    Years since quitting: 28.9   Smokeless tobacco: Never  Vaping Use   Vaping status: Never Used  Substance and Sexual Activity   Alcohol  use: Yes    Alcohol /week: 5.0 standard drinks of alcohol     Types: 5 Cans of beer per week   Drug use: Not Currently    Types: Marijuana    Comment: last use 02/2023   Sexual activity: Yes    Comment: lives with wife and daughter, no dietary restrictions  Other Topics Concern   Not on file  Social History Narrative   Regular exercise:  Walks 1-2 x weekly   Caffeine use:  3-4 daily   4 children- oldest first marriage 87 (son), 66 son, 25 son, 55 yr old girl   Married   Lawyer here from Paediatric Nurse at [employer]               Social Drivers of [employer] Strain: Low Risk  (04/27/2024)   Overall Financial Resource Strain (CARDIA)    Difficulty of Paying Living Expenses: Not very hard  Food Insecurity: No Food Insecurity (04/27/2024)    Hunger Vital Sign    Worried About Running Out of Food in the Last Year: Never true    Ran Out of Food in the Last Year: Never true  Transportation Needs: No Transportation Needs (04/27/2024)   PRAPARE - Administrator, Civil Service (Medical): No    Lack of Transportation (Non-Medical): No  Physical Activity: Insufficiently Active (04/27/2024)   Exercise Vital Sign    Days of Exercise per Week: 1 day    Minutes of Exercise per Session: 30 min  Stress: Stress Concern Present (04/27/2024)   [employer] of Occupational Health - Occupational Stress Questionnaire    Feeling of Stress: To some extent  Social Connections: Moderately Integrated (04/27/2024)   Social Connection and Isolation Panel    Frequency of Communication with Friends and Family: Twice a week    Frequency of Social Gatherings with Friends and Family: Once a week  Attends Religious Services: More than 4 times per year    Active Member of Clubs or Organizations: No    Attends Engineer, Structural: Not on file    Marital Status: Married    Social: Married, Engineer, manufacturing  Family History: The patient's family history includes Arthritis in his father, maternal grandmother, mother, and paternal grandmother; Barrett's esophagus in his father; COPD in his maternal grandfather; Cancer in his mother and paternal grandmother; Diabetes in his brother, father, maternal grandmother, mother, paternal grandfather, and paternal grandmother; Heart disease (age of onset: 41) in his paternal grandfather; Hyperlipidemia in his brother; Hypertension in his brother, father, and mother; Obesity in his father; Sleep apnea in his father; Stroke in his maternal grandmother. There is no history of Kidney disease.  ROS:   Please see the history of present illness.     EKGs/Labs/Other Studies Reviewed:    The following studies were reviewed today:  EKG:   06/19/22: SR LVH   Cardiac Studies & Procedures    ______________________________________________________________________________________________     ECHOCARDIOGRAM  ECHOCARDIOGRAM COMPLETE 05/26/2024  Narrative ECHOCARDIOGRAM REPORT    Patient Name:   Robert [name]  Date of Exam: 05/26/2024 Medical Rec #:  [id number]     Height:       75.0 in Accession #:    [id number]    Weight:       259.6 lb Date of Birth:  10/04/1970     BSA:          2.452 m Patient Age:    53 years      BP:           129/79 mmHg Patient Gender: M             HR:           56 bpm. Exam Location:  [address]  Procedure: 2D Echo, 3D Echo, Cardiac Doppler and Color Doppler (Both Spectral and Color Flow Doppler were utilized during procedure).  Indications:    I35.1 Aortic Insifficiency  History:        Patient has prior history of Echocardiogram examinations, most recent 01/17/2023. CAD; Risk Factors:Hypertension, HLD and Diabetes. Aortic aneurysm.  Sonographer:    Waldo Guadalajara RCS Referring Phys: [id number] Sheldon Sem A Lathon Adan  IMPRESSIONS   1. Left ventricular ejection fraction, by estimation, is 60 to 65%. Left ventricular ejection fraction by 3D volume is 60 %. The left ventricle has normal function. The left ventricle has no regional wall motion abnormalities. The left ventricular internal cavity size was moderately dilated. There is moderate concentric left ventricular hypertrophy. Left ventricular diastolic parameters are indeterminate. 2. Right ventricular systolic function is normal. The right ventricular size is mildly enlarged. There is normal pulmonary artery systolic pressure. The estimated right ventricular systolic pressure is 29.7 mmHg. 3. The mitral valve is normal in structure. Mild mitral valve regurgitation. No evidence of mitral stenosis. 4. The aortic valve is normal in structure. Aortic valve regurgitation is mild to moderate. No aortic stenosis is present. Aortic regurgitation PHT measures 729 msec. 5. Aortic dilatation noted.  Aneurysm of the aortic root, measuring 48 mm. There is mild dilatation of the ascending aorta, measuring 44 mm. 6. The inferior vena cava is normal in size with <50% respiratory variability, suggesting right atrial pressure of 8 mmHg.  FINDINGS Left Ventricle: Left ventricular ejection fraction, by estimation, is 60 to 65%. Left ventricular ejection fraction by 3D volume is 60 %. The left ventricle has normal function. The left  ventricle has no regional wall motion abnormalities. The left ventricular internal cavity size was moderately dilated. There is moderate concentric left ventricular hypertrophy. Left ventricular diastolic parameters are indeterminate. Normal left ventricular filling pressure.  Right Ventricle: The right ventricular size is mildly enlarged. No increase in right ventricular wall thickness. Right ventricular systolic function is normal. There is normal pulmonary artery systolic pressure. The tricuspid regurgitant velocity is 2.33 m/s, and with an assumed right atrial pressure of 8 mmHg, the estimated right ventricular systolic pressure is 29.7 mmHg.  Left Atrium: Left atrial size was normal in size.  Right Atrium: Right atrial size was normal in size.  Pericardium: There is no evidence of pericardial effusion.  Mitral Valve: The mitral valve is normal in structure. Mild mitral valve regurgitation. No evidence of mitral valve stenosis.  Tricuspid Valve: The tricuspid valve is normal in structure. Tricuspid valve regurgitation is trivial. No evidence of tricuspid stenosis.  Aortic Valve: The aortic valve is normal in structure. Aortic valve regurgitation is mild to moderate. Aortic regurgitation PHT measures 729 msec. No aortic stenosis is present.  Pulmonic Valve: The pulmonic valve was normal in structure. Pulmonic valve regurgitation is trivial. No evidence of pulmonic stenosis.  Aorta: Aortic dilatation noted. There is mild dilatation of the ascending aorta, measuring  44 mm. There is an aneurysm involving the aortic root measuring 48 mm.  Venous: The inferior vena cava is normal in size with less than 50% respiratory variability, suggesting right atrial pressure of 8 mmHg.  IAS/Shunts: No atrial level shunt detected by color flow Doppler.  Additional Comments: 3D was performed not requiring image post processing on an independent workstation and was normal.   LEFT VENTRICLE PLAX 2D LVIDd:         6.10 cm         Diastology LVIDs:         4.10 cm         LV e' medial:    6.64 cm/s LV PW:         1.50 cm         LV E/e' medial:  12.1 LV IVS:        1.40 cm         LV e' lateral:   7.40 cm/s LVOT diam:     2.30 cm         LV E/e' lateral: 10.8 LV SV:         144 LV SV Index:   59 LVOT Area:     4.15 cm        3D Volume EF LV IVRT:       95 msec         LV 3D EF:    Left ventricul ar ejection fraction by 3D volume is 60 %.  3D Volume EF: 3D EF:        60 % LV EDV:       167 ml LV ESV:       67 ml LV SV:        101 ml  RIGHT VENTRICLE RV Basal diam:  4.20 cm     PULMONARY VEINS RV Mid diam:    3.00 cm     A Reversal Velocity: 21.60 cm/s RV S prime:     13.90 cm/s  Diastolic Velocity:  44.00 cm/s TAPSE (M-mode): 2.5 cm      S/D Velocity:        1.30 RVSP:  24.7 mmHg   Systolic Velocity:   58.90 cm/s  LEFT ATRIUM             Index        RIGHT ATRIUM           Index LA diam:        4.50 cm 1.84 cm/m   RA Pressure: 3.00 mmHg LA Vol (A2C):   86.7 ml 35.36 ml/m  RA Area:     18.60 cm LA Vol (A4C):   58.6 ml 23.90 ml/m  RA Volume:   47.90 ml  19.54 ml/m LA Biplane Vol: 73.6 ml 30.02 ml/m AORTIC VALVE LVOT Vmax:   142.00 cm/s LVOT Vmean:  96.200 cm/s LVOT VTI:    0.347 m AI PHT:      729 msec  AORTA Ao Root diam: 4.80 cm Ao Asc diam:  4.40 cm  MITRAL VALVE               TRICUSPID VALVE MV Area (PHT):             TR Peak grad:   21.7 mmHg MV Decel Time: 192 msec    TR Vmax:        233.00 cm/s MV E velocity: 80.20 cm/s   Estimated RAP:  3.00 mmHg MV A velocity: 91.70 cm/s  RVSP:           24.7 mmHg MV E/A ratio:  0.87 SHUNTS Systemic VTI:  0.35 m Systemic Diam: 2.30 cm  Wilbert Bihari MD Electronically signed by Wilbert Bihari MD Signature Date/Time: 05/26/2024/2:07:34 PM    Final      CT SCANS  CT CORONARY MORPH W/CTA COR W/SCORE 05/26/2024  Addendum 06/03/2024 11:36 PM ADDENDUM REPORT: 06/03/2024 23:33  EXAM: OVER-READ INTERPRETATION  CT CHEST  The following report is an over-read performed by radiologist Dr. Oneil Devonshire of [facility] on 06/03/2024. This over-read does not include interpretation of cardiac or coronary anatomy or pathology. The coronary calcium  score/coronary CTA interpretation by the cardiologist is attached.  COMPARISON:  06/19/2023  FINDINGS: Cardiovascular: Dilatation of the ascending aorta is noted measuring up to 4.8 cm. The sinus of Valsalva measures 5.3 cm.  Mediastinum/Nodes: There are no enlarged lymph nodes within the visualized mediastinum.  Lungs/Pleura: There is no pleural effusion. Mild scarring is noted in the right lung base medially.  Upper abdomen: No significant findings in the visualized upper abdomen.  Musculoskeletal/Chest wall: No chest wall mass or suspicious osseous findings within the visualized chest.  IMPRESSION: Stable ascending aortic aneurysm measuring 4.8 cm. Ascending thoracic aortic aneurysm. Recommend semi-annual imaging followup by CTA or MRA and referral to cardiothoracic surgery if not already obtained. This recommendation follows 2010 ACCF/AHA/AATS/ACR/ASA/SCA/SCAI/SIR/STS/SVM Guidelines for the Diagnosis and Management of Patients With Thoracic Aortic Disease. Circulation. 2010; 121: Z733-z630. Aortic aneurysm NOS (ICD10-I71.9)   Electronically Signed By: Oneil Devonshire M.D. On: 06/03/2024 23:33  Narrative CLINICAL DATA:  53 yo male with chest pain  EXAM: Cardiac/Coronary CTA  TECHNIQUE: A  non-contrast, gated CT scan was obtained with axial slices of 2.5 mm through the heart for calcium  scoring. Calcium  scoring was performed using the Agatston method. A 120 kV prospective, gated, contrast cardiac CT scan was obtained. Gantry rotation speed was 230 msec and collimation was 0.63 mm. Two sublingual nitroglycerin  tablets (0.8 mg) were given. The 3D data set was reconstructed with motion correction for the best systolic or diastolic phase. Images were analyzed on a dedicated workstation using MPR, MIP, and VRT  modes. The patient received 95 cc of contrast.  FINDINGS: Image quality: Average.  Noise artifact is: Limited.  Coronary Arteries:  Normal coronary origin.  Right dominance.  Left main: The left main is a large caliber vessel with a normal take off from the left coronary cusp that bifurcates to form a left anterior descending artery and a left circumflex artery. There is no plaque or stenosis.  Left anterior descending artery: The LAD has minimal (0-24) plaque in the ostium, proximal, mid and distal vessel. The LAD gives off 1 diagonal branch with mild (25-49) calcified plaque.  Left circumflex artery: The LCX is non-dominant with minimal (0-24) plaque in the proximal vessel. The LCX gives off OM with minimal (0-24) plaque in the proximal and mid vessel.  Right coronary artery: The RCA is dominant with normal take off from the right coronary cusp. There is minimal (0-24) narrowing in the proximal vessel and minimal (0-24) plaque in the distal vessel. The RCA terminates as a PDA and right posterolateral branch without evidence of plaque or stenosis.  Right Atrium: Right atrial size is within normal limits.  Right Ventricle: The right ventricular cavity is within normal limits.  Left Atrium: Left atrial size is normal in size with no left atrial appendage filling defect.  Left Ventricle: The ventricular cavity size is within normal limits.  Pulmonary  arteries: Dilated (3.7 cm).  Pulmonary veins: Normal pulmonary venous drainage.  Pericardium: Normal thickness without significant effusion or calcium  present.  Cardiac valves: The aortic valve is trileaflet without significant calcification. The mitral valve is normal without significant calcification.  Aorta: Dilated aortic root (4.9 cm) and ascending aorta (4.7 cm).  Extra-cardiac findings: See attached radiology report for non-cardiac structures.  IMPRESSION: 1. Coronary calcium  score of 1443. This was 74 percentile for age-, sex, and race-matched controls.  2. Dilated aortic root (4.9 cm) and ascending aorta (4.7 cm).  3. Normal coronary origin with right dominance.  4. Mild (25-49) stenosis in D1; otherwise minimal plaque as outlined.  5. Dilated pulmonary artery (3.7 cm) suggestive of pulmonary hypertension.  RECOMMENDATIONS: CAD-RADS 2: Mild non-obstructive CAD (25-49%). Consider non-atherosclerotic causes of chest pain. Consider preventive therapy and risk factor modification.  Redell Shallow, MD  Electronically Signed: By: Redell Shallow M.D. On: 05/26/2024 14:46   CARDIAC MRI  MR CARDIAC MORPHOLOGY W WO CONTRAST 10/22/2022  Addendum 12/24/2022  8:11 AM ADDENDUM REPORT: 12/24/2022 08:08  ADDENDUM: Reviewing case for potential surgical planning. Though there is heavy breath hold artifact on aortic valve CINE, there is evidence of a small raphe. 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   Electronically Signed By: Stanly Leavens M.D. On: 12/24/2022 08:08  Narrative CLINICAL DATA:  Clinical question of aortopathy and aortic regurgitation Study assumes HCT of 37 and BSA of 2.53 m2.  EXAM: CARDIAC MRI  TECHNIQUE: The patient was scanned on a 1.5 Tesla GE magnet. A dedicated cardiac coil was used. Functional imaging was done using [employer] sequences. 2,3, and 4 chamber views were  done to assess for RWMA's. Modified Simpson's rule using a short axis stack was used to calculate an ejection fraction on a dedicated work [employer]. The patient received 10 cc of Gadavist . After 10 minutes inversion recovery sequences were used to assess for infiltration and scar tissue. Flow quantification was performed 2 times during this examination with flow quantification performed at the levels of the ascending aorta above the valve,  pulmonary artery above the valve.  CONTRAST:  10 cc  of Gadavist   FINDINGS: 1. Mild left ventricular dilation, with LVEDD 68 mm, but LVEDVi 99 mL/m2.  Normal left ventricular thickness, with intraventricular septal thickness of 11 mm, posterior wall thickness of 9 mm, and myocardial mass index of 62 g/m2.  Normal left ventricular systolic function (LVEF =63%). There are no regional wall motion abnormalities.  Left ventricular parametric mapping notable for normal T2.  There is mid inferior ECV elevation (43%)  There is late gadolinium enhancement in the left ventricular myocardium: There is basal inferoseptal LGE (inferior RV insertion site LGE) of unclear significance.  There is also mid inferior transmural LGE.  2.  Dilated right ventricular size with RVEDVI 91 mL/m2.  Normal right ventricular thickness.  Normal right ventricular systolic function (RVEF =59%). There are no regional wall motion abnormalities or aneurysms.  3.  Normal left size.  Right atrial dilation with maximal volume 48 ml/m2.  4.  Mild pulmonary artery dilation.  Aortic root is severely dilated 52 mm at sinus to sinus evaluation.  Ascending aorta is moderately dilated in the ascending aorta, 49 mm.  There is no evidence of aortic dissection.  5. Valve assessment:  Aortic Valve: Thickening of the leaflet tips; tri-leaflet valve. There is moderate aortic regurgitation. Regurgitant fraction 22%.  Pulmonic Valve: There is no significant  regurgitation. Regurgitant fraction < 1%.  Tricuspid Valve: There is mild tricuspid regurgitation Regurgitant fraction 15%.  Mitral Valve: There is no significant regurgitation. Regurgitant fraction 4%.  6.  Normal pericardium.  No pericardial effusion.  7. Grossly, no extracardiac findings. Recommended dedicated study if concerned for non-cardiac pathology.  8.  Breathhold artifact.  Much of study is done free breathing.  IMPRESSION: Severe aortic root dilation.  See MRA for more details.  There is inferior LGE. Given two discrete areas of LGE, clinical correlation for inflammatory disease.  Stanly Leavens MD  Electronically Signed: By: Stanly Leavens M.D. On: 10/22/2022 22:30   ______________________________________________________________________________________________       Recent Labs: 04/27/2024: ALT 19; BUN 15; Creatinine, Ser 1.22; Hemoglobin 13.9; Platelets 205.0; Potassium 4.5; Sodium 140; TSH 0.62  Recent Lipid Panel    Component Value Date/Time   CHOL 172 04/27/2024 1154   TRIG 99.0 04/27/2024 1154   HDL 42.70 04/27/2024 1154   CHOLHDL 4 04/27/2024 1154   VLDL 19.8 04/27/2024 1154   LDLCALC 110 (H) 04/27/2024 1154   LDLCALC 100 (H) 06/09/2020 1613   LDLDIRECT 71.0 02/05/2023 1716    Physical Exam:    VS:  There were no vitals taken for this visit.    Wt Readings from Last 3 Encounters:  04/27/24 259 lb 9.6 oz (117.8 kg)  02/24/24 267 lb (121.1 kg)  01/16/24 267 lb (121.1 kg)    GEN:  Well nourished, well developed in no acute distress HEENT: Normal NECK: No JVD CARDIAC: RRR, no rubs, gallops; diastolic murmur noted  RESPIRATORY:  Clear to auscultation without rales, wheezing or rhonchi  ABDOMEN: Soft, non-tender, non-distended MUSCULOSKELETAL:  No edema; No deformity  SKIN: Warm and dry NEUROLOGIC:  Alert and oriented x 3 PSYCHIATRIC:  Normal affect   ASSESSMENT:    No diagnosis found.    PLAN:     Thoracic aortic  aneurysm Thoracic aortic aneurysm is well-managed with no acute changes on the last CT scan from June 19, 2023. Associated with a bicuspid aortic valve and thoracic aortopathy. Intermittent atypical chest pain is likely musculoskeletal, possibly related to  neck and shoulder issues, not consistent with aortic dissection. 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 Risk of progression to severe aortic regurgitation or aneurysm expansion, potentially requiring surgical intervention. - Coordinate care with Dr. Lucas and ensure follow-up with him - Monitor for progression of aortic regurgitation and aneurysm size  Moderate aortic regurgitation Moderate aortic regurgitation suspected based on clinical findings. Monitored yearly for progression to severe regurgitation. Symptoms include shortness of breath on exertion, but no severe symptoms indicating immediate progression. - Order echocardiogram in approximately 3 months, contingent on insurance coverage - Monitor for symptoms of progression to severe aortic regurgitation  Atypical chest pain Intermittent atypical chest pain, described as muscle-like pain behind the shoulder blade, not associated with exertion. Not consistent with cardiac ischemia or aortic dissection. Likely related to musculoskeletal issues or stress-related factors. - Monitor symptoms and advise to seek immediate care if pain becomes severe or is associated with other concerning symptoms  Coronary artery calcification Incidental finding of coronary artery calcifications with no current symptoms suggestive of coronary artery disease. - Order CT coronary angiogram in approximately 3 months,  contingent on insurance coverage      Stanly Leavens, MD FASE [facility] Cardiologist [facility]  [address] [place], [place] [address] [phone]  10:13 AM  Cardiology Office Note:    Date:  07/20/2024   ID:  Robert [name], DOB 26-Jun-1971, MRN [id number]  PCP:  Domenica Harlene LABOR, MD   [facility] Providers Cardiologist:  Stanly LABOR Leavens, MD     Referring MD: Domenica Harlene LABOR, MD   CC: Aortopathy  History of Present Illness:    Robert [name] is a 53 y.o. male with a hx of HTN, HLD, DM, CAC.  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  He had new colorectal cancer. Found to have increase in aortic diameter.  Saw anesthesia leading to delay in surgery.  Vernadine sees Dr. Lucas as well. 2024: New AKI after chemo due to high output ileostomy. Dr. Lanny has reduced the overall dosage of chemotherapy. Surgery was performed with no issues  Discussed the use of AI scribe software for clinical note transcription with the patient, who gave verbal consent to proceed.  Mr. Sherwin, a 53 year old male with thoracic aortic aneurysm and bicuspid aortic valve presents for follow-up of his cardiovascular conditions.  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 His last CT scan on June 19, 2023, showed stability of the aneurysm. He experiences atypical chest pain, described as a pain behind the shoulder blade, which is not severe and feels like muscle pain. He also notes episodes of heavier breathing during physical exertion, such as climbing stairs or lifting heavy objects. No severe shortness of breath or chest pain that requires him to stop activities.  There have been no recent surgeries, and he is not planning any. He mentions a change in his job situation, which is now less stressful, and attributes some weight gain to his previous stressful job. He is cautious about lifting weights due to his aortopathy.  He  has a history of colitis, which is improving. He has used Imodium  only once in the past two to three weeks and has started a fiber  supplement. He notes some lingering abdominal issues but indicates they are not as severe as before.  He is currently transitioning between jobs and is concerned about a gap in his health insurance coverage, which will last approximately 90 days.   Past Medical History:  Diagnosis Date   Anemia    Arthralgia 01/11/2013   Diffuse, b/l Hips R>L Knees, ankles, low back   Arthritis    Back pain 12/08/2015   Cancer (HCC)    Coronary artery disease    Diabetes mellitus type 2 in obese 11/09/2012   Dyslipidemia 11/09/2012   Esophageal reflux 12/08/2015   Feeling grief 06/09/2020   History of migraine headaches    Hyperglycemia 11/09/2012   Hypertension    Neuropathy    hands and feet    Past Surgical History:  Procedure Laterality Date   DIVERTING ILEOSTOMY N/A 08/23/2022   Procedure: DIVERTING LOOP ILEOSTOMY;  Surgeon: Teresa Lonni HERO, MD;  Location: [facility];  Service: General;  Laterality: N/A;   EUS N/A 06/21/2022   Procedure: LOWER ENDOSCOPIC ULTRASOUND (EUS);  Surgeon: Wilhelmenia Aloha Raddle., MD;  Location: [facility];  Service: Gastroenterology;  Laterality: N/A;   FLEXIBLE SIGMOIDOSCOPY N/A 06/21/2022   Procedure: FLEXIBLE SIGMOIDOSCOPY;  Surgeon: Wilhelmenia Aloha Raddle., MD;  Location: [facility];  Service: Gastroenterology;  Laterality: N/A;   FLEXIBLE SIGMOIDOSCOPY N/A 08/23/2022   Procedure: FLEXIBLE SIGMOIDOSCOPY;  Surgeon: Teresa Lonni HERO, MD;  Location: [facility];  Service: General;  Laterality: N/A;   FLEXIBLE SIGMOIDOSCOPY N/A 03/20/2023   Procedure: DIAGNOSTIC FLEXIBLE SIGMOIDOSCOPY;  Surgeon: Teresa Lonni HERO, MD;  Location: [facility];  Service: General;  Laterality: N/A;   ILEOSTOMY CLOSURE N/A 03/20/2023   Procedure: TAKEDOWN OF DIVERTING LOOP ILEOSTOMY;  Surgeon: Teresa Lonni HERO, MD;  Location: [facility];  Service: General;   Laterality: N/A;   PORT-A-CATH REMOVAL N/A 05/03/2023   Procedure: REMOVAL PORT-A-CATH;  Surgeon: Teresa Lonni HERO, MD;  Location: [facility];  Service: General;  Laterality: N/A;   PORTACATH PLACEMENT N/A 09/21/2022   Procedure: INSERTION PORT-A-CATH WITH ULTRASOUND GUIDANCE;  Surgeon: Teresa Lonni HERO, MD;  Location: [facility];  Service: General;  Laterality: N/A;   XI ROBOTIC ASSISTED LOWER ANTERIOR RESECTION N/A 08/23/2022   Procedure: XI ROBOTIC ASSISTED LOWER ANTERIOR RESECTION with Introperative assessment of profusion and TAP Block;  Surgeon: Teresa Lonni HERO, MD;  Location: [facility];  Service: General;  Laterality: N/A;    Current Medications: No outpatient medications have been marked as taking for the 07/20/24 encounter (Appointment) with Santo Stanly LABOR, MD.     Allergies:   Advil [ibuprofen], Aleve [naproxen], Mucinex [guaifenesin er], Nsaids, Levaquin [levofloxacin], and Latex   Social History   Socioeconomic History   Marital status: Married    Spouse name: Not on file   Number of children: 4   Years of education: Not on file   Highest education level: Bachelor's degree (e.g., BA, AB, BS)  Occupational History   Occupation: Community Education Officer: [employer]  Tobacco Use   Smoking status: Former    Current packs/day: 0.00    Average packs/day: 1.5 packs/day for 10.0 years (15.0 ttl pk-yrs)    Types: Cigarettes    Start date: 08/13/1985    Quit date: 08/14/1995    Years since quitting: 28.9   Smokeless tobacco: Never  Vaping Use   Vaping status: Never Used  Substance and Sexual Activity   Alcohol  use: Yes    Alcohol /week: 5.0 standard drinks of alcohol   Types: 5 Cans of beer per week   Drug use: Not Currently    Types: Marijuana    Comment: last use 02/2023   Sexual activity: Yes    Comment: lives with wife and daughter, no dietary restrictions  Other Topics Concern   Not on file  Social History Narrative   Regular exercise:  Walks 1-2 x  weekly   Caffeine use:  3-4 daily   4 children- oldest first marriage 60 (son), 64 son, 37 son, 38 yr old girl   Married   Lawyer here from Oncologist at [employer] Strain: Low Risk  (04/27/2024)   Overall Financial Resource Strain (CARDIA)    Difficulty of Paying Living Expenses: Not very hard  Food Insecurity: No Food Insecurity (04/27/2024)   Hunger Vital Sign    Worried About Running Out of Food in the Last Year: Never true    Ran Out of Food in the Last Year: Never true  Transportation Needs: No Transportation Needs (04/27/2024)   PRAPARE - Administrator, Civil Service (Medical): No    Lack of Transportation (Non-Medical): No  Physical Activity: Insufficiently Active (04/27/2024)   Exercise Vital Sign    Days of Exercise per Week: 1 day    Minutes of Exercise per Session: 30 min  Stress: Stress Concern Present (04/27/2024)   [employer] of Occupational Health - Occupational Stress Questionnaire    Feeling of Stress: To some extent  Social Connections: Moderately Integrated (04/27/2024)   Social Connection and Isolation Panel    Frequency of Communication with Friends and Family: Twice a week    Frequency of Social Gatherings with Friends and Family: Once a week    Attends Religious Services: More than 4 times per year    Active Member of [employer] or Organizations: No    Attends Engineer, Structural: Not on file    Marital Status: Married    Social: Married, Engineer, manufacturing  Family History: The patient's family history includes Arthritis in his father, maternal grandmother, mother, and paternal grandmother; Barrett's esophagus in his father; COPD in his maternal grandfather; Cancer in his mother and paternal grandmother; Diabetes in his brother, father, maternal grandmother, mother, paternal grandfather, and paternal grandmother; Heart disease (age of onset: 52) in his paternal grandfather;  Hyperlipidemia in his brother; Hypertension in his brother, father, and mother; Obesity in his father; Sleep apnea in his father; Stroke in his maternal grandmother. There is no history of Kidney disease.  ROS:   Please see the history of present illness.     EKGs/Labs/Other Studies Reviewed:    The following studies were reviewed today:  EKG:   06/19/22: SR LVH   Cardiac Studies & Procedures   ______________________________________________________________________________________________     ECHOCARDIOGRAM  ECHOCARDIOGRAM COMPLETE 05/26/2024  Narrative ECHOCARDIOGRAM REPORT    Patient Name:   Robert [name]  Date of Exam: 05/26/2024 Medical Rec #:  [id number]     Height:       75.0 in Accession #:    [id number]    Weight:       259.6 lb Date of Birth:  09-Feb-1971     BSA:          2.452 m Patient Age:    53 years      BP:  129/79 mmHg Patient Gender: M             HR:           56 bpm. Exam Location:  [address]  Procedure: 2D Echo, 3D Echo, Cardiac Doppler and Color Doppler (Both Spectral and Color Flow Doppler were utilized during procedure).  Indications:    I35.1 Aortic Insifficiency  History:        Patient has prior history of Echocardiogram examinations, most recent 01/17/2023. CAD; Risk Factors:Hypertension, HLD and Diabetes. Aortic aneurysm.  Sonographer:    Waldo Guadalajara RCS Referring Phys: [id number] Quy Lotts A Megan Presti  IMPRESSIONS   1. Left ventricular ejection fraction, by estimation, is 60 to 65%. Left ventricular ejection fraction by 3D volume is 60 %. The left ventricle has normal function. The left ventricle has no regional wall motion abnormalities. The left ventricular internal cavity size was moderately dilated. There is moderate concentric left ventricular hypertrophy. Left ventricular diastolic parameters are indeterminate. 2. Right ventricular systolic function is normal. The right ventricular size is mildly enlarged. There is normal  pulmonary artery systolic pressure. The estimated right ventricular systolic pressure is 29.7 mmHg. 3. The mitral valve is normal in structure. Mild mitral valve regurgitation. No evidence of mitral stenosis. 4. The aortic valve is normal in structure. Aortic valve regurgitation is mild to moderate. No aortic stenosis is present. Aortic regurgitation PHT measures 729 msec. 5. Aortic dilatation noted. Aneurysm of the aortic root, measuring 48 mm. There is mild dilatation of the ascending aorta, measuring 44 mm. 6. The inferior vena cava is normal in size with <50% respiratory variability, suggesting right atrial pressure of 8 mmHg.  FINDINGS Left Ventricle: Left ventricular ejection fraction, by estimation, is 60 to 65%. Left ventricular ejection fraction by 3D volume is 60 %. The left ventricle has normal function. The left ventricle has no regional wall motion abnormalities. The left ventricular internal cavity size was moderately dilated. There is moderate concentric left ventricular hypertrophy. Left ventricular diastolic parameters are indeterminate. Normal left ventricular filling pressure.  Right Ventricle: The right ventricular size is mildly enlarged. No increase in right ventricular wall thickness. Right ventricular systolic function is normal. There is normal pulmonary artery systolic pressure. The tricuspid regurgitant velocity is 2.33 m/s, and with an assumed right atrial pressure of 8 mmHg, the estimated right ventricular systolic pressure is 29.7 mmHg.  Left Atrium: Left atrial size was normal in size.  Right Atrium: Right atrial size was normal in size.  Pericardium: There is no evidence of pericardial effusion.  Mitral Valve: The mitral valve is normal in structure. Mild mitral valve regurgitation. No evidence of mitral valve stenosis.  Tricuspid Valve: The tricuspid valve is normal in structure. Tricuspid valve regurgitation is trivial. No evidence of tricuspid  stenosis.  Aortic Valve: The aortic valve is normal in structure. Aortic valve regurgitation is mild to moderate. Aortic regurgitation PHT measures 729 msec. No aortic stenosis is present.  Pulmonic Valve: The pulmonic valve was normal in structure. Pulmonic valve regurgitation is trivial. No evidence of pulmonic stenosis.  Aorta: Aortic dilatation noted. There is mild dilatation of the ascending aorta, measuring 44 mm. There is an aneurysm involving the aortic root measuring 48 mm.  Venous: The inferior vena cava is normal in size with less than 50% respiratory variability, suggesting right atrial pressure of 8 mmHg.  IAS/Shunts: No atrial level shunt detected by color flow Doppler.  Additional Comments: 3D was performed not requiring image post processing on an independent workstation  and was normal.   LEFT VENTRICLE PLAX 2D LVIDd:         6.10 cm         Diastology LVIDs:         4.10 cm         LV e' medial:    6.64 cm/s LV PW:         1.50 cm         LV E/e' medial:  12.1 LV IVS:        1.40 cm         LV e' lateral:   7.40 cm/s LVOT diam:     2.30 cm         LV E/e' lateral: 10.8 LV SV:         144 LV SV Index:   59 LVOT Area:     4.15 cm        3D Volume EF LV IVRT:       95 msec         LV 3D EF:    Left ventricul ar ejection fraction by 3D volume is 60 %.  3D Volume EF: 3D EF:        60 % LV EDV:       167 ml LV ESV:       67 ml LV SV:        101 ml  RIGHT VENTRICLE RV Basal diam:  4.20 cm     PULMONARY VEINS RV Mid diam:    3.00 cm     A Reversal Velocity: 21.60 cm/s RV S prime:     13.90 cm/s  Diastolic Velocity:  44.00 cm/s TAPSE (M-mode): 2.5 cm      S/D Velocity:        1.30 RVSP:           24.7 mmHg   Systolic Velocity:   58.90 cm/s  LEFT ATRIUM             Index        RIGHT ATRIUM           Index LA diam:        4.50 cm 1.84 cm/m   RA Pressure: 3.00 mmHg LA Vol (A2C):   86.7 ml 35.36 ml/m  RA Area:     18.60 cm LA Vol (A4C):   58.6 ml 23.90 ml/m   RA Volume:   47.90 ml  19.54 ml/m LA Biplane Vol: 73.6 ml 30.02 ml/m AORTIC VALVE LVOT Vmax:   142.00 cm/s LVOT Vmean:  96.200 cm/s LVOT VTI:    0.347 m AI PHT:      729 msec  AORTA Ao Root diam: 4.80 cm Ao Asc diam:  4.40 cm  MITRAL VALVE               TRICUSPID VALVE MV Area (PHT):             TR Peak grad:   21.7 mmHg MV Decel Time: 192 msec    TR Vmax:        233.00 cm/s MV E velocity: 80.20 cm/s  Estimated RAP:  3.00 mmHg MV A velocity: 91.70 cm/s  RVSP:           24.7 mmHg MV E/A ratio:  0.87 SHUNTS Systemic VTI:  0.35 m Systemic Diam: 2.30 cm  Wilbert Bihari MD Electronically signed by Wilbert Bihari MD Signature Date/Time: 05/26/2024/2:07:34 PM    Final  CT SCANS  CT CORONARY MORPH W/CTA COR W/SCORE 05/26/2024  Addendum 06/03/2024 11:36 PM ADDENDUM REPORT: 06/03/2024 23:33  EXAM: OVER-READ INTERPRETATION  CT CHEST  The following report is an over-read performed by radiologist Dr. Oneil Devonshire of [facility] on 06/03/2024. This over-read does not include interpretation of cardiac or coronary anatomy or pathology. The coronary calcium  score/coronary CTA interpretation by the cardiologist is attached.  COMPARISON:  06/19/2023  FINDINGS: Cardiovascular: Dilatation of the ascending aorta is noted measuring up to 4.8 cm. The sinus of Valsalva measures 5.3 cm.  Mediastinum/Nodes: There are no enlarged lymph nodes within the visualized mediastinum.  Lungs/Pleura: There is no pleural effusion. Mild scarring is noted in the right lung base medially.  Upper abdomen: No significant findings in the visualized upper abdomen.  Musculoskeletal/Chest wall: No chest wall mass or suspicious osseous findings within the visualized chest.  IMPRESSION: Stable ascending aortic aneurysm measuring 4.8 cm. Ascending thoracic aortic aneurysm. Recommend semi-annual imaging followup by CTA or MRA and referral to cardiothoracic surgery if not  already obtained. This recommendation follows 2010 ACCF/AHA/AATS/ACR/ASA/SCA/SCAI/SIR/STS/SVM Guidelines for the Diagnosis and Management of Patients With Thoracic Aortic Disease. Circulation. 2010; 121: Z733-z630. Aortic aneurysm NOS (ICD10-I71.9)   Electronically Signed By: Oneil Devonshire M.D. On: 06/03/2024 23:33  Narrative CLINICAL DATA:  53 yo male with chest pain  EXAM: Cardiac/Coronary CTA  TECHNIQUE: A non-contrast, gated CT scan was obtained with axial slices of 2.5 mm through the heart for calcium  scoring. Calcium  scoring was performed using the Agatston method. A 120 kV prospective, gated, contrast cardiac CT scan was obtained. Gantry rotation speed was 230 msec and collimation was 0.63 mm. Two sublingual nitroglycerin  tablets (0.8 mg) were given. The 3D data set was reconstructed with motion correction for the best systolic or diastolic phase. Images were analyzed on a dedicated workstation using MPR, MIP, and VRT modes. The patient received 95 cc of contrast.  FINDINGS: Image quality: Average.  Noise artifact is: Limited.  Coronary Arteries:  Normal coronary origin.  Right dominance.  Left main: The left main is a large caliber vessel with a normal take off from the left coronary cusp that bifurcates to form a left anterior descending artery and a left circumflex artery. There is no plaque or stenosis.  Left anterior descending artery: The LAD has minimal (0-24) plaque in the ostium, proximal, mid and distal vessel. The LAD gives off 1 diagonal branch with mild (25-49) calcified plaque.  Left circumflex artery: The LCX is non-dominant with minimal (0-24) plaque in the proximal vessel. The LCX gives off OM with minimal (0-24) plaque in the proximal and mid vessel.  Right coronary artery: The RCA is dominant with normal take off from the right coronary cusp. There is minimal (0-24) narrowing in the proximal vessel and minimal (0-24) plaque in the distal  vessel. The RCA terminates as a PDA and right posterolateral branch without evidence of plaque or stenosis.  Right Atrium: Right atrial size is within normal limits.  Right Ventricle: The right ventricular cavity is within normal limits.  Left Atrium: Left atrial size is normal in size with no left atrial appendage filling defect.  Left Ventricle: The ventricular cavity size is within normal limits.  Pulmonary arteries: Dilated (3.7 cm).  Pulmonary veins: Normal pulmonary venous drainage.  Pericardium: Normal thickness without significant effusion or calcium  present.  Cardiac valves: The aortic valve is trileaflet without significant calcification. The mitral valve is normal without significant calcification.  Aorta: Dilated aortic root (4.9 cm)  and ascending aorta (4.7 cm).  Extra-cardiac findings: See attached radiology report for non-cardiac structures.  IMPRESSION: 1. Coronary calcium  score of 1443. This was 33 percentile for age-, sex, and race-matched controls.  2. Dilated aortic root (4.9 cm) and ascending aorta (4.7 cm).  3. Normal coronary origin with right dominance.  4. Mild (25-49) stenosis in D1; otherwise minimal plaque as outlined.  5. Dilated pulmonary artery (3.7 cm) suggestive of pulmonary hypertension.  RECOMMENDATIONS: CAD-RADS 2: Mild non-obstructive CAD (25-49%). Consider non-atherosclerotic causes of chest pain. Consider preventive therapy and risk factor modification.  Redell Shallow, MD  Electronically Signed: By: Redell Shallow M.D. On: 05/26/2024 14:46   CARDIAC MRI  MR CARDIAC MORPHOLOGY W WO CONTRAST 10/22/2022  Addendum 12/24/2022  8:11 AM ADDENDUM REPORT: 12/24/2022 08:08  ADDENDUM: Reviewing case for potential surgical planning. Though there is heavy breath hold artifact on aortic valve CINE, there is evidence of a small raphe. 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   Electronically Signed By: Stanly Leavens M.D. On: 12/24/2022 08:08  Narrative CLINICAL DATA:  Clinical question of aortopathy and aortic regurgitation Study assumes HCT of 37 and BSA of 2.53 m2.  EXAM: CARDIAC MRI  TECHNIQUE: The patient was scanned on a 1.5 Tesla GE magnet. A dedicated cardiac coil was used. Functional imaging was done using Fiesta sequences. 2,3, and 4 chamber views were done to assess for RWMA's. Modified Simpson's rule using a short axis stack was used to calculate an ejection fraction on a dedicated work [employer]. The patient received 10 cc of Gadavist . After 10 minutes inversion recovery sequences were used to assess for infiltration and scar tissue. Flow quantification was performed 2 times during this examination with flow quantification performed at the levels of the ascending aorta above the valve, pulmonary artery above the valve.  CONTRAST:  10 cc  of Gadavist   FINDINGS: 1. Mild left ventricular dilation, with LVEDD 68 mm, but LVEDVi 99 mL/m2.  Normal left ventricular thickness, with intraventricular septal thickness of 11 mm, posterior wall thickness of 9 mm, and myocardial mass index of 62 g/m2.  Normal left ventricular systolic function (LVEF =63%). There are no regional wall motion abnormalities.  Left ventricular parametric mapping notable for normal T2.  There is mid inferior ECV elevation (43%)  There is late gadolinium enhancement in the left ventricular myocardium: There is basal inferoseptal LGE (inferior RV insertion site LGE) of unclear significance.  There is also mid inferior transmural LGE.  2.  Dilated right ventricular size with RVEDVI 91 mL/m2.  Normal right ventricular thickness.  Normal right ventricular systolic function (RVEF =59%). There are no regional wall motion abnormalities or aneurysms.  3.  Normal left size.  Right atrial dilation with maximal volume 48  ml/m2.  4.  Mild pulmonary artery dilation.  Aortic root is severely dilated 52 mm at sinus to sinus evaluation.  Ascending aorta is moderately dilated in the ascending aorta, 49 mm.  There is no evidence of aortic dissection.  5. Valve assessment:  Aortic Valve: Thickening of the leaflet tips; tri-leaflet valve. There is moderate aortic regurgitation. Regurgitant fraction 22%.  Pulmonic Valve: There is no significant regurgitation. Regurgitant fraction < 1%.  Tricuspid Valve: There is mild tricuspid regurgitation Regurgitant fraction 15%.  Mitral Valve: There is no significant regurgitation. Regurgitant fraction 4%.  6.  Normal pericardium.  No pericardial effusion.  7. Grossly, no extracardiac findings. Recommended dedicated study  if concerned for non-cardiac pathology.  8.  Breathhold artifact.  Much of study is done free breathing.  IMPRESSION: Severe aortic root dilation.  See MRA for more details.  There is inferior LGE. Given two discrete areas of LGE, clinical correlation for inflammatory disease.  Stanly Leavens MD  Electronically Signed: By: Stanly Leavens M.D. On: 10/22/2022 22:30   ______________________________________________________________________________________________       Recent Labs: 04/27/2024: ALT 19; BUN 15; Creatinine, Ser 1.22; Hemoglobin 13.9; Platelets 205.0; Potassium 4.5; Sodium 140; TSH 0.62  Recent Lipid Panel    Component Value Date/Time   CHOL 172 04/27/2024 1154   TRIG 99.0 04/27/2024 1154   HDL 42.70 04/27/2024 1154   CHOLHDL 4 04/27/2024 1154   VLDL 19.8 04/27/2024 1154   LDLCALC 110 (H) 04/27/2024 1154   LDLCALC 100 (H) 06/09/2020 1613   LDLDIRECT 71.0 02/05/2023 1716    Physical Exam:    VS:  There were no vitals taken for this visit.    Wt Readings from Last 3 Encounters:  04/27/24 259 lb 9.6 oz (117.8 kg)  02/24/24 267 lb (121.1 kg)  01/16/24 267 lb (121.1 kg)    GEN:  Well nourished, well  developed in no acute distress HEENT: Normal NECK: No JVD CARDIAC: RRR, no rubs, gallops; diastolic murmur noted  RESPIRATORY:  Clear to auscultation without rales, wheezing or rhonchi  ABDOMEN: Soft, non-tender, non-distended MUSCULOSKELETAL:  No edema; No deformity  SKIN: Warm and dry NEUROLOGIC:  Alert and oriented x 3 PSYCHIATRIC:  Normal affect   ASSESSMENT/PLAN:     Thoracic aortic aneurysm with forme fruste bicuspid aortic valve and moderate aortic regurgitation Thoracic aortic aneurysm with bicuspid aortic valve and aortic regurgitation. Aortic size is 49 mm, close to surgical threshold of 55 mm. Monitoring for sinus of valsalva dilation and severe aortic regurgitation. Prevention of aortic dilation is a priority. Blood pressure control is crucial to slow aneurysm expansion. Losartan  is considered for its dual benefit in slowing aortic dilation and preserving kidney function. - Continue monitoring with CT scheduled for six months in April. - Initiated losartan  25 mg PO daily to aid in blood pressure control and potentially slow aortic dilation. - Will check labs in a couple of weeks to assess kidney function and blood pressure response. - Will CC Dr. Lucas  Hypertension Recent readings indicating upward trend despite medication adherence. Blood pressure control is critical for managing thoracic aortic aneurysm and preventing further complications. Losartan  is introduced to assist in achieving better blood pressure control. - Initiated losartan  25 mg PO daily. - Monitor blood pressure at home and report average readings. - Will adjust losartan  dose if blood pressure remains elevated after initial treatment.  Non obstructive CAD No significant blockages requiring immediate intervention. Focus on prevention and management of cardiovascular risk factors. - Continue current management and monitoring of cardiovascular risk factors.  Hyperlipidemia Management is part of  cardiovascular risk reduction strategy. Fasting lipid panel to be conducted to assess current status. - Ordered fasting lipid panel with f/u labs  Type 2 diabetes mellitus Reasonably well-controlled blood sugars. Potential benefit of losartan  in preventing kidney dysfunction associated with diabetes. - Initiated losartan  25 mg PO daily. - Will monitor kidney function with upcoming labs.  Peripheral neuropathy Symptoms exacerbated by colder weather. Gabapentin  is being used for symptom management. - Continue gabapentin  for neuropathy management.  Longitudinal care: The evaluation and management services provided today reflect the complexity inherent in caring for this patient, including the ongoing longitudinal relationship  and management of multiple chronic conditions and/or the need for care coordination. The visit required a comprehensive assessment and management plan tailored to the patient's unique needs Time was spent addressing not only the acute concerns but also the broader context of the patient's health, including preventive care, chronic disease management, and care coordination as appropriate.  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   Stanly Leavens, MD FASE [facility] Cardiologist [facility]  [address] [place], [place] [address] [phone]  10:13 AM

## 2024-08-02 ENCOUNTER — Other Ambulatory Visit: Payer: Self-pay | Admitting: Family Medicine

## 2024-08-02 DIAGNOSIS — E1129 Type 2 diabetes mellitus with other diabetic kidney complication: Secondary | ICD-10-CM

## 2024-08-02 DIAGNOSIS — E785 Hyperlipidemia, unspecified: Secondary | ICD-10-CM
